# Patient Record
Sex: Female | Born: 1945
Health system: Southern US, Community
[De-identification: ages and names within clinical notes are randomized; demographics above are authoritative.]

## PROBLEM LIST (undated history)

## (undated) DIAGNOSIS — G35 Multiple sclerosis: Secondary | ICD-10-CM

## (undated) DIAGNOSIS — M1990 Unspecified osteoarthritis, unspecified site: Secondary | ICD-10-CM

## (undated) DIAGNOSIS — I2510 Atherosclerotic heart disease of native coronary artery without angina pectoris: Secondary | ICD-10-CM

## (undated) DIAGNOSIS — T8859XA Other complications of anesthesia, initial encounter: Secondary | ICD-10-CM

## (undated) DIAGNOSIS — K224 Dyskinesia of esophagus: Secondary | ICD-10-CM

## (undated) DIAGNOSIS — J45909 Unspecified asthma, uncomplicated: Secondary | ICD-10-CM

## (undated) DIAGNOSIS — D649 Anemia, unspecified: Secondary | ICD-10-CM

## (undated) DIAGNOSIS — I209 Angina pectoris, unspecified: Secondary | ICD-10-CM

## (undated) DIAGNOSIS — K219 Gastro-esophageal reflux disease without esophagitis: Secondary | ICD-10-CM

## (undated) DIAGNOSIS — T4145XA Adverse effect of unspecified anesthetic, initial encounter: Secondary | ICD-10-CM

## (undated) DIAGNOSIS — H04123 Dry eye syndrome of bilateral lacrimal glands: Secondary | ICD-10-CM

## (undated) HISTORY — PX: TONSILLECTOMY: SUR1361

## (undated) HISTORY — PX: OTHER SURGICAL HISTORY: SHX169

## (undated) HISTORY — PX: ABDOMINAL HYSTERECTOMY: SHX81

## (undated) HISTORY — PX: KNEE ARTHROSCOPY: SHX127

## (undated) HISTORY — DX: Atherosclerotic heart disease of native coronary artery without angina pectoris: I25.10

## (undated) HISTORY — PX: BLEPHAROPLASTY: SUR158

## (undated) HISTORY — PX: PARATHYROIDECTOMY: SHX19

---

## 1998-01-26 ENCOUNTER — Emergency Department (HOSPITAL_COMMUNITY): Admission: EM | Admit: 1998-01-26 | Discharge: 1998-01-26 | Payer: Self-pay | Admitting: Emergency Medicine

## 1998-01-26 ENCOUNTER — Encounter: Payer: Self-pay | Admitting: Emergency Medicine

## 1998-10-21 ENCOUNTER — Other Ambulatory Visit: Admission: RE | Admit: 1998-10-21 | Discharge: 1998-10-21 | Payer: Self-pay | Admitting: Gynecology

## 1999-06-18 ENCOUNTER — Encounter: Payer: Self-pay | Admitting: Gynecology

## 1999-06-18 ENCOUNTER — Encounter: Admission: RE | Admit: 1999-06-18 | Discharge: 1999-06-18 | Payer: Self-pay | Admitting: Gynecology

## 1999-10-25 ENCOUNTER — Other Ambulatory Visit: Admission: RE | Admit: 1999-10-25 | Discharge: 1999-10-25 | Payer: Self-pay | Admitting: Gynecology

## 2000-10-31 ENCOUNTER — Other Ambulatory Visit: Admission: RE | Admit: 2000-10-31 | Discharge: 2000-10-31 | Payer: Self-pay | Admitting: Gynecology

## 2000-11-28 ENCOUNTER — Encounter: Admission: RE | Admit: 2000-11-28 | Discharge: 2000-11-28 | Payer: Self-pay | Admitting: Gynecology

## 2000-11-28 ENCOUNTER — Encounter: Payer: Self-pay | Admitting: Gynecology

## 2001-08-23 ENCOUNTER — Encounter (HOSPITAL_COMMUNITY): Payer: Self-pay | Admitting: Oncology

## 2001-08-23 ENCOUNTER — Emergency Department (HOSPITAL_COMMUNITY): Admission: EM | Admit: 2001-08-23 | Discharge: 2001-08-23 | Payer: Self-pay | Admitting: Emergency Medicine

## 2001-11-30 ENCOUNTER — Encounter: Payer: Self-pay | Admitting: Internal Medicine

## 2001-11-30 ENCOUNTER — Encounter: Admission: RE | Admit: 2001-11-30 | Discharge: 2001-11-30 | Payer: Self-pay | Admitting: Internal Medicine

## 2002-05-07 ENCOUNTER — Encounter: Payer: Self-pay | Admitting: Emergency Medicine

## 2002-05-07 ENCOUNTER — Inpatient Hospital Stay (HOSPITAL_COMMUNITY): Admission: EM | Admit: 2002-05-07 | Discharge: 2002-05-08 | Payer: Self-pay | Admitting: Emergency Medicine

## 2002-12-03 ENCOUNTER — Encounter: Payer: Self-pay | Admitting: Internal Medicine

## 2002-12-03 ENCOUNTER — Encounter: Admission: RE | Admit: 2002-12-03 | Discharge: 2002-12-03 | Payer: Self-pay | Admitting: Internal Medicine

## 2003-12-12 ENCOUNTER — Ambulatory Visit (HOSPITAL_BASED_OUTPATIENT_CLINIC_OR_DEPARTMENT_OTHER): Admission: RE | Admit: 2003-12-12 | Discharge: 2003-12-12 | Payer: Self-pay | Admitting: Orthopedic Surgery

## 2003-12-12 ENCOUNTER — Ambulatory Visit (HOSPITAL_COMMUNITY): Admission: RE | Admit: 2003-12-12 | Discharge: 2003-12-12 | Payer: Self-pay | Admitting: Orthopedic Surgery

## 2003-12-22 ENCOUNTER — Ambulatory Visit (HOSPITAL_COMMUNITY): Admission: RE | Admit: 2003-12-22 | Discharge: 2003-12-22 | Payer: Self-pay | Admitting: Gastroenterology

## 2004-02-16 ENCOUNTER — Ambulatory Visit (HOSPITAL_COMMUNITY): Admission: RE | Admit: 2004-02-16 | Discharge: 2004-02-16 | Payer: Self-pay | Admitting: Internal Medicine

## 2004-07-02 ENCOUNTER — Ambulatory Visit (HOSPITAL_COMMUNITY): Admission: RE | Admit: 2004-07-02 | Discharge: 2004-07-02 | Payer: Self-pay | Admitting: Gastroenterology

## 2004-10-20 ENCOUNTER — Ambulatory Visit (HOSPITAL_COMMUNITY): Admission: RE | Admit: 2004-10-20 | Discharge: 2004-10-20 | Payer: Self-pay | Admitting: Orthopedic Surgery

## 2004-10-21 ENCOUNTER — Ambulatory Visit (HOSPITAL_BASED_OUTPATIENT_CLINIC_OR_DEPARTMENT_OTHER): Admission: RE | Admit: 2004-10-21 | Discharge: 2004-10-22 | Payer: Self-pay | Admitting: Orthopedic Surgery

## 2005-03-14 ENCOUNTER — Ambulatory Visit (HOSPITAL_COMMUNITY): Admission: RE | Admit: 2005-03-14 | Discharge: 2005-03-14 | Payer: Self-pay | Admitting: Internal Medicine

## 2006-03-20 ENCOUNTER — Emergency Department (HOSPITAL_COMMUNITY): Admission: EM | Admit: 2006-03-20 | Discharge: 2006-03-20 | Payer: Self-pay | Admitting: Emergency Medicine

## 2006-03-23 ENCOUNTER — Ambulatory Visit (HOSPITAL_COMMUNITY): Admission: RE | Admit: 2006-03-23 | Discharge: 2006-03-23 | Payer: Self-pay | Admitting: Internal Medicine

## 2006-07-25 ENCOUNTER — Ambulatory Visit (HOSPITAL_BASED_OUTPATIENT_CLINIC_OR_DEPARTMENT_OTHER): Admission: RE | Admit: 2006-07-25 | Discharge: 2006-07-25 | Payer: Self-pay | Admitting: Orthopedic Surgery

## 2006-07-25 ENCOUNTER — Encounter (INDEPENDENT_AMBULATORY_CARE_PROVIDER_SITE_OTHER): Payer: Self-pay | Admitting: *Deleted

## 2006-08-01 ENCOUNTER — Observation Stay (HOSPITAL_COMMUNITY): Admission: EM | Admit: 2006-08-01 | Discharge: 2006-08-01 | Payer: Self-pay | Admitting: Emergency Medicine

## 2006-10-04 HISTORY — PX: CARDIAC CATHETERIZATION: SHX172

## 2006-11-14 ENCOUNTER — Encounter: Admission: RE | Admit: 2006-11-14 | Discharge: 2006-11-14 | Payer: Self-pay | Admitting: Internal Medicine

## 2006-12-12 ENCOUNTER — Other Ambulatory Visit: Admission: RE | Admit: 2006-12-12 | Discharge: 2006-12-12 | Payer: Self-pay | Admitting: Interventional Radiology

## 2006-12-12 ENCOUNTER — Encounter: Admission: RE | Admit: 2006-12-12 | Discharge: 2006-12-12 | Payer: Self-pay | Admitting: Endocrinology

## 2006-12-12 ENCOUNTER — Encounter (INDEPENDENT_AMBULATORY_CARE_PROVIDER_SITE_OTHER): Payer: Self-pay | Admitting: Interventional Radiology

## 2007-03-26 ENCOUNTER — Ambulatory Visit (HOSPITAL_COMMUNITY): Admission: RE | Admit: 2007-03-26 | Discharge: 2007-03-26 | Payer: Self-pay | Admitting: Internal Medicine

## 2007-11-05 ENCOUNTER — Encounter: Admission: RE | Admit: 2007-11-05 | Discharge: 2007-11-05 | Payer: Self-pay | Admitting: Internal Medicine

## 2008-04-03 ENCOUNTER — Ambulatory Visit (HOSPITAL_COMMUNITY): Admission: RE | Admit: 2008-04-03 | Discharge: 2008-04-03 | Payer: Self-pay | Admitting: Internal Medicine

## 2009-04-07 ENCOUNTER — Other Ambulatory Visit: Admission: RE | Admit: 2009-04-07 | Discharge: 2009-04-07 | Payer: Self-pay | Admitting: Internal Medicine

## 2009-04-20 ENCOUNTER — Ambulatory Visit (HOSPITAL_COMMUNITY): Admission: RE | Admit: 2009-04-20 | Discharge: 2009-04-20 | Payer: Self-pay | Admitting: Internal Medicine

## 2009-04-23 ENCOUNTER — Encounter: Admission: RE | Admit: 2009-04-23 | Discharge: 2009-04-23 | Payer: Self-pay | Admitting: Neurology

## 2009-05-09 ENCOUNTER — Inpatient Hospital Stay (HOSPITAL_COMMUNITY): Admission: EM | Admit: 2009-05-09 | Discharge: 2009-05-12 | Payer: Self-pay | Admitting: Emergency Medicine

## 2010-04-18 ENCOUNTER — Encounter: Payer: Self-pay | Admitting: Internal Medicine

## 2010-04-23 ENCOUNTER — Other Ambulatory Visit: Payer: Self-pay | Admitting: Dermatology

## 2010-05-28 ENCOUNTER — Other Ambulatory Visit: Payer: Self-pay | Admitting: Dermatology

## 2010-06-15 ENCOUNTER — Other Ambulatory Visit (HOSPITAL_COMMUNITY): Payer: Self-pay | Admitting: Internal Medicine

## 2010-06-15 DIAGNOSIS — Z1231 Encounter for screening mammogram for malignant neoplasm of breast: Secondary | ICD-10-CM

## 2010-06-16 LAB — OVA AND PARASITE EXAMINATION

## 2010-06-16 LAB — BASIC METABOLIC PANEL
BUN: 14 mg/dL (ref 6–23)
BUN: 8 mg/dL (ref 6–23)
CO2: 27 mEq/L (ref 19–32)
Calcium: 8.9 mg/dL (ref 8.4–10.5)
Calcium: 9.5 mg/dL (ref 8.4–10.5)
Creatinine, Ser: 0.73 mg/dL (ref 0.4–1.2)
Creatinine, Ser: 0.94 mg/dL (ref 0.4–1.2)
GFR calc Af Amer: >60 mL/min (ref >60)
Glucose, Bld: 112 mg/dL — ABNORMAL HIGH (ref 70–99)
Potassium: 3.8 mEq/L (ref 3.5–5.1)

## 2010-06-16 LAB — COMPREHENSIVE METABOLIC PANEL
Alkaline Phosphatase: 70 U/L (ref 39–117)
BUN: 6 mg/dL (ref 6–23)
CO2: 26 mEq/L (ref 19–32)
Chloride: 109 mEq/L (ref 96–112)
Creatinine, Ser: 0.77 mg/dL (ref 0.4–1.2)
GFR calc Af Amer: >60 mL/min (ref >60)
GFR calc non Af Amer: >60 mL/min (ref >60)
Glucose, Bld: 112 mg/dL — ABNORMAL HIGH (ref 70–99)
Sodium: 142 mEq/L (ref 135–145)

## 2010-06-16 LAB — DIFFERENTIAL
Basophils Absolute: 0 10*3/uL (ref 0.0–0.1)
Basophils Relative: 0 % (ref 0–1)
Basophils Relative: 0 % (ref 0–1)
Eosinophils Absolute: 0 10*3/uL (ref 0.0–0.7)
Eosinophils Absolute: 0.1 10*3/uL (ref 0.0–0.7)
Lymphocytes Relative: 34 % (ref 12–46)
Lymphs Abs: 2.9 10*3/uL (ref 0.7–4.0)
Monocytes Absolute: 0.6 10*3/uL (ref 0.1–1.0)
Monocytes Absolute: 0.7 10*3/uL (ref 0.1–1.0)
Monocytes Relative: 7 % (ref 3–12)
Neutro Abs: 5.8 10*3/uL (ref 1.7–7.7)

## 2010-06-16 LAB — STOOL CULTURE

## 2010-06-16 LAB — HEMOCCULT GUIAC POC 1CARD (OFFICE): Fecal Occult Bld: POSITIVE

## 2010-06-16 LAB — CLOSTRIDIUM DIFFICILE EIA: C difficile Toxins A+B, EIA: NEGATIVE

## 2010-06-16 LAB — CBC
Hemoglobin: 12.5 g/dL (ref 12.0–15.0)
Hemoglobin: 14.8 g/dL (ref 12.0–15.0)
MCHC: 34.4 g/dL (ref 30.0–36.0)
MCHC: 34.5 g/dL (ref 30.0–36.0)
MCV: 92.3 fL (ref 78.0–100.0)
Platelets: 175 10*3/uL (ref 150–400)
Platelets: 180 10*3/uL (ref 150–400)
RBC: 4.05 MIL/uL (ref 3.87–5.11)
RBC: 4.64 MIL/uL (ref 3.87–5.11)
RDW: 13 % (ref 11.5–15.5)
RDW: 13.3 % (ref 11.5–15.5)
WBC: 8.6 10*3/uL (ref 4.0–10.5)
WBC: 8.8 10*3/uL (ref 4.0–10.5)

## 2010-06-16 LAB — URINALYSIS, ROUTINE W REFLEX MICROSCOPIC
Bilirubin Urine: NEGATIVE
Glucose, UA: NEGATIVE mg/dL
Hgb urine dipstick: NEGATIVE
Nitrite: NEGATIVE
Urobilinogen, UA: 0.2 mg/dL (ref 0.0–1.0)
pH: 8 (ref 5.0–8.0)

## 2010-06-16 LAB — FECAL LACTOFERRIN, QUANT

## 2010-06-16 LAB — MAGNESIUM: Magnesium: 2.3 mg/dL (ref 1.5–2.5)

## 2010-06-16 LAB — URINE MICROSCOPIC-ADD ON

## 2010-06-22 ENCOUNTER — Ambulatory Visit (HOSPITAL_COMMUNITY)
Admission: RE | Admit: 2010-06-22 | Discharge: 2010-06-22 | Disposition: A | Discharge status: Home / Self Care | Payer: Medicare Other | Source: Ambulatory Visit | Attending: Internal Medicine | Admitting: Internal Medicine

## 2010-06-22 DIAGNOSIS — Z1231 Encounter for screening mammogram for malignant neoplasm of breast: Secondary | ICD-10-CM | POA: Insufficient documentation

## 2010-08-13 NOTE — Op Note (Signed)
NAMEGERENE, NEDD                ACCOUNT NO.:  [id number]   MEDICAL RECORD NO.:  [id number]          PATIENT TYPE:  AMB   LOCATION:  ENDO                         FACILITY:  [facility]   PHYSICIAN:  Bernette Redbird, M.D.   DATE OF BIRTH:  03/16/1946   DATE OF PROCEDURE:  07/02/2004  DATE OF DISCHARGE:                                 OPERATIVE REPORT   PROCEDURE:  Colonoscopy.   INDICATIONS:  Screening for colon cancer.  The patient had a negative  colonoscopy approximately seven years ago.   FINDINGS:  Normal exam to the cecum except for some degree of sigmoid  fixation.   DESCRIPTION OF PROCEDURE:  The nature, purpose and risks of this procedure  were familiar to the patient who provided written consent.  Sedation for  this procedure and the upper endoscopy which preceded it, total fentanyl  100 mg and Versed 12 mg IV without arrhythmias or desaturation.  The Olympus  adjustable tension pediatric video coloscope was advanced with some  difficulty to the fixated sigmoid region, using some overriding of loops,  and turning the patient into the supine position with some external  compression to reach the cecum as identified by clear visualization of the  appendiceal orifice and transillumination of the right lower quadrant, after  which pull-back was performed.  The quality of the prep was excellent and it  was felt that all areas were well seen.   This was a normal examination.  No polyps, cancer, colitis, vascular  malformations or diverticulosis were noted.  Retroflexion of the rectum and  reinspection of the rectum were unremarkable.  No biopsies were obtained.  The patient tolerated the procedure well and there were no apparent  complications.   IMPRESSION:  Normal screening colonoscopy in a patient with a family history  of colon polyps in her mother (histology not known).   PLAN:  Followup colonoscopy in five years because of the family history of  colon polyps.      RB/MEDQ  D:  07/02/2004  T:  07/02/2004  Job:  [id number]   cc:   Soyla Murphy. Renne Crigler, M.D.  [address]  [place]  [place] [address]  Fax: [phone]

## 2010-08-13 NOTE — Cardiovascular Report (Signed)
NAME:  [name], [name]                          ACCOUNT NO.:  [id number]   MEDICAL RECORD NO.:  [id number]                   PATIENT TYPE:  OUT   LOCATION:  CATH                                 FACILITY:  [facility]   PHYSICIAN:  John R. Tysinger, M.D.              DATE OF BIRTH:  1945/07/26   DATE OF PROCEDURE:  05/08/2002  DATE OF DISCHARGE:                              CARDIAC CATHETERIZATION   REFERRING PHYSICIANS:  Dr. Merri Brunette and Dr. Aggie Cosier   PROCEDURES:  1. Left heart catheterization.  2. Coronary cineangiography.  3. Left ventricular cineangiogram.  4. Perclose of the right femoral artery.   INDICATION FOR PROCEDURES:  This 65 year old female has had several  admissions for severe anterior chest pain described as a squeezing chest  pain that comes in waves, and is somewhat atypical with a sharp component.  She has had several admissions for this same chest pain and was now  considered at high risk for ischemic heart disease with a clinical need to  assess her coronary anatomy.   DESCRIPTION OF PROCEDURE:  After signing an informed consent, the patient  was transported from her room at [facility] to the [facility] Cardiac Catheterization Laboratory.  Her right groin was prepped  and draped in the sterile fashion and anesthetized locally with 1%  lidocaine.  A 6 French introducer sheath was inserted percutaneously into  the right femoral artery.  A 6 French #4 Judkins coronary catheters were  used to make injections into the native coronary arteries.  A 6 French  pigtail catheter was used to measure pressures in the left ventricle and  aorta and to make a mid stream injection into the left ventricle.  The  patient tolerated the procedure well and no complications were noted.  At  the end of the procedure, the catheter and sheath were removed from the  right femoral artery and hemostasis was easily obtained with a Perclose  closure system.   MEDICATIONS GIVEN:  None.   HEMODYNAMIC DATA:  Left ventricular pressure 170/13-24, aortic pressure  170/82 with a mean of 118.  Left ventricular ejection fraction 60-70%.   CINE FINDINGS:   CORONARY CINE ANGIOGRAPHY:  1. Left coronary artery:  The ostium and left main appear normal. The left     main is a very short structure with essentially two separate takeoff     orifices for the LAD and circumflex.  2. Left anterior descending:  The LAD is normal in appearance without     significant plaque.  There is a mild area in the mid LAD which appears to     have mild muscle bridging.  The remainder of the LAD is normal and has     normal flow.  3. Circumflex coronary artery:  There is a mild plaque at the ostium of the     circumflex, which causes a less  than 20% narrowing.  Otherwise, the     circumflex appears normal and has normal antegrade flow and normal distal     runoff.  4. Right coronary artery:  Appears normal.   LEFT VENTRICULAR CINEANGIOGRAM:  The left ventricular chamber size and  contractility appears normal.  The LAD appears normal.  The overall left  ventricular contractility is normal with an ejection fraction between 60 and  70%.  The left ventricular wall thickness appears normal.  The mitral and  aortic valves also appear normal.   FINAL DIAGNOSES:  1. Minor coronary artery disease with a mild plaque in the ostium of the     circumflex coronary artery, less than 20% and mild muscle bridging in the     mid left anterior descending.  2. Normal coronary flow.  3. Normal left ventricular function.  4. Normal mitral and aortic valves.  5. Successful Perclose of the right femoral artery.   DISPOSITION:  I feel that the patient is at low risk cardiovascular wise and  can be continued on her same medical management.  She can be transferred to  the short-stay unit today rather than returning to [facility] and then be  discharged from the short-stay unit when  stable.                                                    John R. Tysinger, M.D.    JRT/MEDQ  D:  05/08/2002  T:  05/08/2002  Job:  [id number]   cc:   Jaclyn Prime. Lucas Mallow, M.D.  [address]  [place]  [place] [address]  Fax: [phone]   Soyla Murphy. Renne Crigler, M.D.  [address]  [place]  [place] [address]  Fax: [phone]   Cardiac Catheterization Laboratory

## 2010-08-13 NOTE — Op Note (Signed)
NAMESHEVY, YANEY                ACCOUNT NO.:  [id number]   MEDICAL RECORD NO.:  [id number]          PATIENT TYPE:  AMB   LOCATION:  DSC                          FACILITY:  [facility]   PHYSICIAN:  Katy Fitch. Sypher, M.D. DATE OF BIRTH:  1945-04-24   DATE OF PROCEDURE:  10/21/2004  DATE OF DISCHARGE:                                 OPERATIVE REPORT   PREOP DIAGNOSIS:  Advanced degenerative arthritis, right thumb CMC joint,  Eaton stage III.   POSTOP DIAGNOSIS:  Advanced degenerative arthritis, right thumb CMC joint,  Eaton stage III.   OPERATION:  1.  Resection of right trapezium with synovectomy of carpometacarpal joint,      removing multiple loose bodies.  2.  Distraction arthroplasty with 0.062 inches Kirschner wire fixation x 3.  3.  Palmaris longus free graft intermetacarpal ligament reconstruction.   OPERATING SURGEON:  Katy Fitch. Sypher, M.D.   ASSISTANT:  Nurse.   ANESTHESIA:  General by LMA technique, supervising anesthesiologist is Dr.  Gelene Mink   INDICATIONS:  [name] is a 65 year old patient well known to our  practice who has had a chronic history of bilateral thumb CMC arthrosis.  She has failed to have adequate pain relief after prolonged splinting,  activity modification, use of oral anti-inflammatory medications and steroid  injections.   Her x-rays revealed bone-on-bone arthropathy at the right thumb CMC joint.  She requested surgical reconstruction for pain relief.   Preoperatively she was advised of potential risks and benefits of surgery.  She understands that the anticipated benefit is significant pain relief  while maintaining adequate pinch prehensile strength.   She understands the potential risks of infection, rupture of the  intermetacarpal ligament reconstruction and the possibility to develop CRPS  type 1 or reflex sympathetic dystrophy type changes in the postoperative  period that could be troublesome and require further treatment.   After informed consent, she is brought to the operating room at this time.   PROCEDURE:  [name] is brought to the operating room and placed in  supine position on the operating table.   Following anesthesia consultation by Dr. Gelene Mink, general anesthesia by  LMA technique was selected.   Following induction of stable general anesthesia, the right arm was prepped  with Betadine soap solution, sterilely draped. 1 gram of Ancef was  administered as IV prophylactic antibiotic.   The procedure commenced with a curvilinear incision exposing the thenar  musculature and the insertion of the abductor pollicis longus tendons at the  base of thumb metacarpal. The interval between the extensor pollicis brevis  and abductor pollicis longus dorsal slip was sharply developed exposing the  [facility] joint. A capsulectomy was accomplished followed by subperiosteal  exposure of the entire trapezium with careful dorsal dissection, sparing the  radial artery and palmar dissection deep to the thenar muscles. The STT  joint was entered and found to have a minor degree of chondromalacia.   The trapezium was carefully morselized with a fine rongeur and removed  piecemeal followed by removal of a large loose body from between the index  and thumb metacarpals.  A complete synovectomy was accomplished. There was  not a significant osteophyte noted at the palmar beak of the metacarpal.   Drill holes were created through the base of thumb metacarpal and index  metacarpal to allow passage of the palmaris longus tendon graft to create an  intermetacarpal ligament. The palmaris longus tendon was harvested through a  short transverse incision on the volar aspect of the distal wrist flexion  crease. A Brand tendon stripper was used to harvest the entire palmaris  longus and a portion of its muscular origin. The muscle fibers were removed  with osteotome followed by threading of the palmaris longus through the   insertion of extensor carpi radialis brevis dorsally and both tails of the  palmaris longus were brought through the index metacarpal from dorsal ulnar  to palmar radial and the tails of the palmaris longus and brought up to the  base of thumb metacarpal from its proximal surface to the dorsal cortex  creating an intermetacarpal ligament.   The thumb was placed in distraction and the ligament tension adjusted  appropriately. The ligaments then completed by anchoring the distal stumps  of the palmaris longus in the abductor pollicis longus dorsal slip which was  attached to the base of thumb metacarpal.   A very stable construct was achieved.   The thumb was then distracted in the manner of Meals utilizing three 0.062-  inch Kirschner wires with radiographic control, suspending the thumb  metacarpal off of the index metacarpal. Care was taken to maintain a  position of palmar and radial neutral abduction.   C-arm fluoroscopy confirmed satisfactory position of the metacarpal and  Kirschner wires.   All wounds were then irrigated followed by hemostasis with bipolar cautery.  The wounds repaired with intradermal through Prolene and Steri-Strips.   0.25% Marcaine was infiltrated for postoperative analgesia followed by  dressing the wound with a voluminous gauze dressing, sterile Webril and  dorsal and palmar plaster sandwich splints supporting the thumb in the pin  position.   There were no apparent complications.   Tourniquet released with a total tourniquet time of 71 minutes at 250 mmHg.   Note that Ms. Crocket will be admitted to recovery care center for observation  of her vital signs and appropriate analgesics in the form of IV PCA morphine  and p.o. and IV Dilaudid.   She will be discharged by the nursing staff in the morning of October 22, 2004  after a 23-hour observation.      RVS/MEDQ  D:  10/21/2004  T:  10/21/2004  Job:  [id number]

## 2010-08-13 NOTE — H&P (Signed)
[name], [name] NO.:  [id number]   MEDICAL RECORD NO.:  [id number]          PATIENT TYPE:  INP   LOCATION:  1824                         FACILITY:  [facility]   PHYSICIAN:  Michaelyn Barter, M.D. DATE OF BIRTH:  1945-08-17   DATE OF ADMISSION:  08/01/2006  DATE OF DISCHARGE:                              HISTORY & PHYSICAL   PRIMARY CARE PHYSICIAN:  Dr. Merri Brunette.   CARDIOLOGIST:  Dr. Lucas Mallow.   CHIEF COMPLAINT:  Chest pain.   HISTORY OF PRESENT ILLNESS:  The patient is a 65 year old female with a  past medical history of chest pain and MS.  She states that last night  at approximately 9:15 p.m., she developed some centrally located chest  pain that radiated to the right side of her chest.  She states that she  felt a squeezing-like sensation in her neck and pain in her teeth.  The  pain lasted for approximately 20 minutes.  She took 2 sublingual  nitroglycerin, which did not alleviate her symptoms.  She called EMS and  states that EMS gave her 2 more sublingual nitroglycerin and she started  to feel some relief.  She states that she has had similar pain off and  on for several years.  She states that the pain is typical of her prior  episodes of chest pain.  She states that the pain usually goes away  spontaneously.  There are no aggravating factors.  She gets the pain  every 6 weeks to every few days and is always pretty similar in its  occurrence.  The pain was sharp.  She had some nausea, but denies  fevers, chills or emesis.  No shortness of breath and no cough.   PAST MEDICAL HISTORY:  1. Multiple sclerosis diagnosed in 2000.  2. Asthma.  3. Episodes of chest pain for which the patient underwent cardiac      catheterization, May 08, 2002, completed by Dr. Charolette Child.      At that particular time, the patient was found to have minor      coronary artery disease with mild plaque in the ostium of the      circumflex coronary artery, less than  20%, and mild muscle bridging      in the mid left anterior descending.   PAST SURGICAL HISTORY:  1. Release of right thumb A1 pulley.  2. Injection of right thumb carpometacarpal joint with Depo-Medrol and      lidocaine secondary to chronic stenosing tenosynovitis of the right      thumb at A1 pulley and painful right thumb carpometacarpal joint      due to osteoarthrosis, December 12, 2003, completed by Dr. Josephine Igo.  3. Colonoscopy completed July 02, 2004 by Dr. Matthias Hughs, which was found      to be normal.  4. EGD performed July 02, 2004 secondary to a chief complaint of chest      pain.  5. Resection of the right trapezium with synovectomy of the      carpometacarpal joint secondary to advanced degenerative  arthritis      of the right thumb CMC joint, each in stage III, completed by Dr.      Teressa Senter October 21, 2004.  6. Reconstruction of the left thumb carpometacarpal joint by left      trapezium excision with synovectomy, completed July 25, 2006 by      Dr. Josephine Igo.   ALLERGIES:  None.   HOME MEDICATIONS:  1. Norvasc.  2. Prilosec.  3. Vytorin.  4. Mobic.  5. Baby aspirin.  6. Gabapentin on a p.r.n. basis.   SOCIAL HISTORY:  Cigarettes:  The patient denies.  Alcohol:  The patient  denies.   FAMILY HISTORY:  The mother is deceased most likely secondary to failure  to thrive.  Father is 71 and has a history of CHF.   REVIEW OF SYSTEMS:  As per HPI.   PHYSICAL EXAMINATION:  GENERAL:  The patient is awake.  She is in no  obvious distress.  VITALS:  Temperature is 98.1, blood pressure 122/65, heart rate 64,  respirations 16, O2 SAT 96% on room air.  HEENT:  Normocephalic, atraumatic.  Anicteric.  Extraocular movements  are intact.  Oral mucosa is pink.  No thrush.  No exudates.  Pupils are  equally reactive to light.  NECK:  No JVD.  No lymphadenopathy.  Thyroid is not palpable.  CARDIAC:  S1 and S2 present.  Regular rate and rhythm.  No S3, no S4.   RESPIRATORY:  No crackles or wheezes.  ABDOMEN:  Flat, soft, nontender and non-distended.  Positive bowel  sounds for 4 quadrants.  No masses are palpated.  EXTREMITIES:  Left arm is in a cast.  There is no leg swelling.  NEUROLOGICAL:  The patient is alert and oriented x3.  MUSCULOSKELETAL:  Right arm pain strength 5/5; bilateral leg strength  5/5.   LABORATORY AND ACCESSORY CLINICAL DATA:  A pH of 7.376, pCO2 40.0,  bicarbonate 23.5.  Hemoglobin 12.6, hematocrit 37.0.  D-dimer 0.46.  Sodium 140, potassium 3.9, chloride 110, glucose 103, BUN 24, creatinine  0.8.  CK-MB POC less than 1.0, troponin I POC less than 0.05, myoglobin  POC 46.9.   Chest x-ray reveals no acute disease.  Mild peribronchial thickening may  relate to chronic bronchitis or smoking.   EKG reveals sinus bradycardia, no Q waves or ST abnormalities.   ASSESSMENT AND PLAN:  1. Chest pain:  The patient's current chest pain has some atypical      features to it.  She indicates that she has had similar symptoms      for quite some time.  The trigger is questionable.  We will admit      the patient for 23-hour observation.  We will check the patient's      troponin I and CK-MB x3 every 8 hours apart to rule the patient out      for cardiac event.  Again, the patient's EKG is not impressive at      this particular time.  We will provide oxygen, morphine and      aspirin.  2. History of multiple sclerosis:  We will monitor this for now.  3. Gastrointestinal prophylaxis:  We will provide Protonix.  4. Deep venous thrombosis prophylaxis:  We will provide Lovenox.      Michaelyn Barter, M.D.  Electronically Signed     OR/MEDQ  D:  08/01/2006  T:  08/01/2006  Job:  [id number]   cc:   Soyla Murphy. Renne Crigler, M.D.

## 2010-08-13 NOTE — Op Note (Signed)
NAME:  [name], [name]                          ACCOUNT NO.:  [id number]   MEDICAL RECORD NO.:  [id number]                   PATIENT TYPE:  AMB   LOCATION:  DSC                                  FACILITY:  [facility]   PHYSICIAN:  Katy Fitch. Naaman Plummer., M.D.          DATE OF BIRTH:  26-May-1945   DATE OF PROCEDURE:  12/12/2003  DATE OF DISCHARGE:                                 OPERATIVE REPORT   PREOPERATIVE DIAGNOSES:  1.  Chronic stenosing tenosynovitis, right thumb at A1 pulley.  2.  Painful right thumb carpometacarpal joint due to osteoarthrosis.   POSTOPERATIVE DIAGNOSES:  1.  Chronic stenosing tenosynovitis, right thumb at A1 pulley.  2.  Painful right thumb carpometacarpal joint due to osteoarthrosis.   OPERATION PERFORMED:  1.  Release of right thumb A-1 pulley.  2.  Injection of right thumb carpometacarpal joint with Depo-Medrol and      lidocaine.   SURGEON:  Katy Fitch. Sypher, M.D.   ASSISTANT:  Jonni Sanger, P.A.   ANESTHESIA:  0.25% Marcaine and 2% lidocaine metacarpal head level block  supplemented by IV sedation.   SUPERVISING ANESTHESIOLOGIST:  Janetta Hora. Gelene Mink, M.D.   INDICATIONS FOR PROCEDURE:  [name] is a 65 year old right-hand  dominant woman with a history of multiple sclerosis.  She has also had a  history of coronary artery disease.  She is on chronic disability due to her  illness.  She is a well-known patient to our practice.  She has a history of  triggering of her right thumb and bilateral thumb carpometacarpal pain due  to progressive degenerative arthritis.  Due to failure to respond to  nonoperative measures, the patient is brought to the operating room at this  time anticipating release of her right thumb A-1 pulley under local  anesthesia and sedation and she requested that we inject her right thumb CMC  joint while under sedation to minimize the discomfort of this procedure.  After informed consent, she is brought to the operating room  at this time.   DESCRIPTION OF PROCEDURE:  [name] was brought to the operating room  and placed in supine position on the operating table.  Following light  sedation, the right arm was prepped with Betadine soap and solution and  sterilely draped.  A pneumatic tourniquet was applied to the proximal  brachium.  0.25% Marcaine and 2% lidocaine were infiltrated at metacarpal  head level to obtain a digital block and after sedation was satisfactory,  the right thumb CMC joint was injected from a palmar approach with a 27  gauge needle with a mixture of 50/50 Depo-Medrol and 1% plain lidocaine.  Good joint distention was achieved.   Attention was then directed to the right thumb stenosing tenosynovitis  predicament.  The hand was exsanguinated with an Esmarch bandage and the  arterial tourniquet on the proximal brachium inflated to 230 mmHg.  The  procedure commenced with  a short transverse incision directly over the  palpably enlarged A-1 pulley.  Subcutaneous tissues were carefully divided  taking care to identify and retract the radial proper digital nerve to the  thumb.  The A-1 pulley was isolated and split with scalpel and scissors.  Thereafter full range of motion of the interphalangeal joint was recovered.  The wound was then repaired with interrupted suture of 5-0 nylon.  A  compressive dressing was applied with Xeroflo, sterile gauze and Ace wrap.  There were no apparent complications.      RVS/MEDQ  D:  12/12/2003  T:  12/14/2003  Job:  [id number]

## 2010-08-13 NOTE — Op Note (Signed)
[name], [name]                ACCOUNT NO.:  [id number]   MEDICAL RECORD NO.:  [id number]          PATIENT TYPE:  AMB   LOCATION:  ENDO                         FACILITY:  [facility]   PHYSICIAN:  Bernette Redbird, M.D.   DATE OF BIRTH:  [birth year]   DATE OF PROCEDURE:  07/02/2004  DATE OF DISCHARGE:                                 OPERATIVE REPORT   PROCEDURE:  Upper endoscopy.   INDICATIONS FOR PROCEDURE:  Recurring chest pain of indeterminate etiology.   FINDINGS:  Normal exam.   SEDATION:  Fentanyl 50 mcg and Versed 8 mg IV, without arrhythmias or  desaturation.   DESCRIPTION OF PROCEDURE:  The nature, purpose, and risks of the procedure  had been discussed with the patient who provided written consent.  The  Olympus videoendoscope was passed under direct vision.  The vocal cords  looked normal.  The esophagus was readily entered and was normal in its  entirety, without evidence of any esophageal spasm, free reflux, reflux  esophagitis, Barrett's esophagus, varices, infection, neoplasia, ring,  stricture, or hiatal hernia.   The stomach contained no significant residual, and he had normal mucosa  without evidence of gastritis, erosions, ulcers, polyps, or masses.  Retroflex view of the cardia was normal, showing a snug diaphragmatic  hiatus.  The pylorus, duodenal bulb, and second duodenum looked normal.  The  scope was removed.   The patient tolerated the procedure well and without apparent complication.   IMPRESSION:  Normal endoscopy; no source of chest pain endoscopically  evident.  (V76.51).   PLAN:  No specific change in management or further evaluation is needed at  present.  The patient has been free of chest pain recently.      RB/MEDQ  D:  07/02/2004  T:  07/02/2004  Job:  [id number]   cc:   Soyla Murphy. Renne Crigler, M.D.  [address]  [place]  [place] [address]  Fax: [phone]   Jaclyn Prime. Lucas Mallow, M.D.  [address]  [place]  [place] [address]  Fax: [phone]

## 2010-08-13 NOTE — H&P (Signed)
NAMEJAMAR, [name]                          ACCOUNT NO.:  [id number]   MEDICAL RECORD NO.:  [id number]                   PATIENT TYPE:  INP   LOCATION:  0353                                 FACILITY:  [facility]   PHYSICIAN:  Jaclyn Prime. Lucas Mallow, M.D.                DATE OF BIRTH:  [birth year]   DATE OF ADMISSION:  05/07/2002  DATE OF DISCHARGE:                                HISTORY & PHYSICAL   CHIEF COMPLAINT:  Chest pain.   HISTORY OF PRESENT ILLNESS:  This is a 65 year old woman who has had  intermittent chest pain for the last year or two.  She developed severe  anterior squeezing chest pain which came and went in waves and has some  sharp component today.  This pain lasted for two hours and was associated  with severe radiation into her jaws and shoulders.  She came to the  emergency room because she was alone and the combination of pains.   PAST MEDICAL HISTORY:  Most notable for multiple sclerosis.  She has  suffered from that illness for some years now and has been disabled by it.  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  She now  functions reasonably well as a homemaker and does some teaching in preschool  at [employer].   She has been on Betaseron shots for multiple sclerosis and has previously  been on Carbatrol for chest discomfort. She has also taken Provigil 20 mg  daily within the last several months for anti-sleepiness drug.  She was seen  in consultation for chest pain last summer and at that time was placed on  Protonix.  She appeared to have an adequate response to that, but the  present episode of chest discomfort is more severe than what she has endured  previously.   CURRENT MEDICATIONS:  1. Aspirin 81 mg daily.  2. Betaseron injection every other day.  3. Vitamin C, E, and coenzyme Q plus multivitamins.   She saw her multiple sclerosis specialist, Dr. Trudie Buckler at [facility] on [address]  [place], [place] [employer],  [place] [address], within the last few days and was told that she can  take a month's vacation from Betaseron.   PAST SURGICAL HISTORY:  1. Tonsillectomy about 1964.  2. Hysterectomy about 1989.  3. Surgery for hyperparathyroidism in 1991 or 1993.  4. Removal of wisdom teeth.  5. C section.   SOCIAL HISTORY:  She has a daughter who is 33, and she has twin sons, both  at [employer].   FAMILY HISTORY:  Her parents are both living.  Her father is 61 and has  congestive heart failure.  Her mother is 28 and had bypass surgery at about  the age of 47.  A brother is alive and well.   REVIEW OF SYSTEMS:  CONSTITUTIONAL:  She denies fevers, chills, or  sweats.  She has had no claudication or edema.  EYES:  She does have some blurring  but no distinct double vision.  She wears glasses.  ENT: No deafness or  dizziness.  She has her own teeth.  CARDIOVASCULAR:  See History of Present  Illness.  RESPIRATORY:  There is a questionable history of asthma.  She has  never smoked.  GI:  She has symptoms of globus and possibly reflux.  No  change in bowel habits and no diarrhea.  GU:  No dysuria or pyuria.  MUSCULOSKELETAL:  She has pain in her right shoulder and diffuse muscular  pain and weakness.  SKIN AND BREASTS:  No rash or nodule.  NEUROLOGIC:  No  faintness or syncope.  PSYCHIATRIC:  No depression or hallucinations.  ENDOCRINE:  No known diabetes or thyroid disease. HEME:  No swelling in the  neck, axillae, or groin, lymphatic.  No known drug allergies.  All the  remaining systems in the comprehensive 14-system review are negative.   PHYSICAL EXAMINATION:  VITAL SIGNS:  Blood pressure 130/70, pulse 65 and  regular, respirations 16 and unlabored.  GENERAL:  Well developed, well nourished middle-aged woman in no acute  distress.  She is oriented to person, place, and time.  Mood and affect are  appropriate.  HEENT:  Conjunctivae and lids reveal no xanthelasma or arcus  senilis.  She  has her own teeth which are in good repair.  Oral mucosa reveals no pallor  or cyanosis.  NECK:  Supple and symmetrical.  Trachea is midline and mobile.  There is no  palpable thyromegaly or cervical noted.  No carotid bruit or JVD.  LUNGS:  Her respiratory effort is normal.  Lungs are clear to auscultation  and percussion.  BACK:  Straight and nontender.  Her gait is not tested.  HEART:  She would have limited ability to undergo a stress test.  (Not  mentioned above is the fact she had a Persantine Cardiolite test on March 04, 2002, with normal results.)  Her muscle strength and tone are at the low  limit of normal to moderately reduced for her age.  Cardiac apical impulses  could take border of cardiac illness within left anterior axillary line.  Heart rhythm is regular and rate is normal.  First sounds seen, second  physiologically.  SKIN:  Digits and nails are without clubbing or cyanosis.  Subcutaneous  tissue reveals no stasis dermatitis or ulcer.  ABDOMEN:  Flat and nontender without palpably enlarged liver or spleen. The  abdominal aorta is not palpable, and there is no bruit.  EXTREMITIES:  The femoral arteries are without bruits.  The posterior tibial  and dorsalis pedis pulses are both palpable 1 to 2+ bilaterally.  Her legs  reveal no edema.   LABORATORY DATA:  Available laboratory data and EKG were done today and  shows nonspecific T wave abnormality.  It is otherwise normal   ASSESSMENT:  The patient has had several prior evaluations for chest pain,  and this is her second hospital trip for that symptom.  As I have explained  to her, in spite of her negative previous findings, she really does now need  a heart catheterization to clarify her diagnosis, and if coronary artery  disease is not present, to exclude that as a diagnosis.  We will plan to put  her in telemetry bed, serial enzymes, and arrange for cardiac  catheterization by Dr. Aleen Campi  tomorrow.  Jaclyn Prime. Lucas Mallow, M.D.    DDG/MEDQ  D:  05/07/2002  T:  05/07/2002  Job:  [id number]

## 2010-08-13 NOTE — Discharge Summary (Signed)
[name], [name]                ACCOUNT NO.:  [id number]   MEDICAL RECORD NO.:  [id number]          PATIENT TYPE:  INP   LOCATION:  6524                         FACILITY:  [facility]   PHYSICIAN:  Hind I Elsaid, MD      DATE OF BIRTH:  1945/05/24   DATE OF ADMISSION:  07/31/2006  DATE OF DISCHARGE:  08/01/2006                               DISCHARGE SUMMARY   PRIMARY CARE PHYSICIAN:  Dr. Merri Brunette   CARDIOLOGIST:  Dr. Lucas Mallow   DISCHARGE DIAGNOSES:  1. Atypical chest pain, most probably could be due to angina.  2. Multiple sclerosis diagnosed in 2000.  3. Asthma.  4. Episode of chest pain for which she underwent cardiac cath in 2004      done by Dr. Aleen Campi.  The patient was found to have minor coronary      artery disease with mild blackened ostium of the circumflex.   DISCHARGE MEDICATIONS:  1. Aspirin 81 mg p.o. daily.  2. Nitroglycerin transdermal 0.2 mg daily p.r.n. for pain.  3. Vytorin one tab p.o. q.6-4 hour p.r.n.  4. Mobic.  5. Neurontin p.r.n.  6. Prilosec 20 mg p.o. daily.   CONSULTATIONS:  None.   PROCEDURE:  Chest x-ray:  No acute cardiopulmonary disease, mild  peribronchial thickening, may be chronic bronchitis or smoking.   BRIEF HISTORY:  She is a 65 year old female with past medical history of  chest pain and multiple sclerosis, last night at approximately 9:15 p.m.  developed some centrally located chest pain, radiated to the right side  of her chest, the pain is sharp pain and radiates to her neck and on her  teeth, it lasted approximately 20 minutes and she took two sublingual  nitroglycerin with no response and then two more on the EMS and then she  felt much better.  Since admission, she did not have any further chest  pain.  Patient was admitted for observation over 24 hours for monitoring  of any evidence of myocardial infarction or any evidence of unstable  angina.  During hospitalization, pain completely resolved actually from  the emergency  room.  Patient denies any further chest pain and denies  any shortness of breath and no cough.  During hospitalization, cardiac  enzymes, CK-MB and troponin was run about three times without any  evidence of myocardial infarction.  EKG was also done with no evidence  of any change on EKG from previous admission.  We think the pain is most  probably atypical chest pain and could be MS from her history of MS or  it could be related to a result of angina and patient may need to be  worked up as an outpatient with exercise stress test.  We will discharge  the patient with aspirin and nitroglycerin sublingual and patient to  make an appointment with Dr. Lucas Mallow, her cardiologist, within this week  or next week for evaluation of the chest pain for possible exercise  stress test to be done as outpatient.  Patient also advised to return to  the hospital if the chest pain returns back.  Patient completely  understand the situation and she agrees with the plan of care.   1. Multiple sclerosis stable at this time and no further      recommendation to be done inside the hospital.  2. History of asthma.  Patient to continue her own home medications.   DISPOSITION:  Patient is medically stable to be discharged home.  Patient to follow with Dr. Lucas Mallow and the PMD this week or next for  further evaluation of the chest pain for possible exercise stress test  if deemed necessary and return to the emergency room for further  evaluation of this pain if it comes back.      Hind Bosie Helper, MD  Electronically Signed     HIE/MEDQ  D:  08/01/2006  T:  08/01/2006  Job:  [id number]

## 2010-08-13 NOTE — Op Note (Signed)
[name], CRUTCHER                ACCOUNT NO.:  [id number]   MEDICAL RECORD NO.:  [id number]          PATIENT TYPE:  AMB   LOCATION:  DSC                          FACILITY:  [facility]   PHYSICIAN:  Katy Fitch. Sypher, M.D. DATE OF BIRTH:  Sep 13, 1945   DATE OF PROCEDURE:  07/25/2006  DATE OF DISCHARGE:                               OPERATIVE REPORT   PREOPERATIVE DIAGNOSIS:  Severe left thumb carpometacarpal arthrosis and  right thumb metacarpophalangeal joint arthritis.   POSTOPERATIVE DIAGNOSIS:  Severe left thumb carpometacarpal arthrosis  and right thumb metacarpophalangeal joint arthritis.   OPERATION:  1. Reconstruction of left thumb carpometacarpal joint by left      trapezium excision with synovectomy.  2. Reconstruction of the index thumb intermetacarpal ligament      utilizing a distally based one-half flexor carpi radialis graft      supplemented by an Arthrex tight rope suturedesis.  3. Injection of right thumb metacarpophalangeal joint under anesthesia      with Depo-Medrol 40 mg/mL and 2% lidocaine 50/50 mixture total      volume 0.9 mL.   OPERATING SURGEON:  Josephine Igo, M.D.   ASSISTANT:  Molly Maduro Dasnoit PA-C.   ANESTHESIA:  General by LMA supplemented by a left axillary block,  supervising anesthesiologist is Dr. Gypsy Balsam.   INDICATIONS:  [name] is a 65 year old woman who presented for  evaluation of bilateral hand arthritis.   She is status post reconstruction of her right thumb carpometacarpal  joint utilizing a tendon graft intermetacarpal ligament reconstruction  and a temporary pin fixation.  We have followed her for several years  and now she has disabling left thumb arthritis and requests similar  reconstruction.   On the left side, we have advised her to utilize a Arthrex tight rope  suturedesis rather than Kirschner wire fixation in addition to an  intermetacarpal ligament reconstruction.   Her palmaris longus is a very diminutive therefore we  recommended a  flexor carpi radialis partial distally based tendon graft.   After informed consent, she is brought to the operating room at this  time.   PROCEDURE:  [name] is brought to the operating room and placed in  supine position on the table.   Following anesthesia consult by Dr. Gypsy Balsam general anesthesia by LMA  technique supplemented by postoperative axillary block was recommended  and accepted by [name].   Dr. Gypsy Balsam placed the axillary block holding area without complication.   [name] was transferred to room 6 and placed in supine position on the  operating table.   Under Dr. Burnett Corrente direct supervision general anesthesia by LMA technique  was induced.   After alcohol prep the right thumb metacarpophalangeal joint was  injected with Depo-Medrol with lidocaine through a dorsal radial  approach.  Excellent capsular distension was achieved.   Attention then directed to the left arm.  A pneumatic tourniquet was  applied to proximal brachium.  1 gram of Ancef was administered as IV  prophylactic antibiotic followed by routine Betadine scrub and paint.  Following placement of sterile stockinette and arthroscopy drapes.  The  left arm was exsanguinated with Esmarch bandage and the arterial  tourniquet inflated to 240 mmHg.  Procedure commenced with a Wagner  curvilinear incision exposing the thenar musculature.  After careful  dissection of the radial superficial sensory branches.  A full-thickness  flap was created elevating the abductor pollicis brevis and opponens  pollicis, splitting the anterior slip of the abductor pollicis longus  tendon.  The extensor pollicis brevis and posterior slip of the abductor  pollicis longus were left intact.   The trapezium was exposed subperiosteally followed by morcellation and  piecemeal removal with complete synovectomy the [facility] joint.  Care was  taken protect the radial artery and the flexor carpi radialis during   trapezium resection.   Appropriately sized drill holes were created through the base of the  index and long finger metacarpal utilizing a pilot 0.035-inch Kirschner  wire and Arthrex drill guide followed by passage of an Arthrex tight  rope device to create a suturedesis between the index and thumb  metacarpals.   1. Half of the flexor carpi radialis was harvested from a transverse      incision in the mid forearm with muscle and tendon splitting      technique creating a 12 cm long distally based tendon graft      attached at the base of the trapezoid and index metacarpal.   This tendon graft was then drawn up into the drill hole into the index  metacarpal utilizing a 2-0 suture of FiberWire placed over the dorsal  tight rope button and after appropriate tensioning the grafts the  remainder of the graft was brought to the base of thumb metacarpal  looped and tied with an overhand knot between the index and thumb  metacarpals to create interposition soft tissue arthroplasty.   The tail of the tendon graft was then woven on the deep surface of the  thumb metacarpal through the remaining portion of the tendon graft and  double knotted and with a series of mattress sutures of 3-0 Ethilon.   This created a very satisfactory intermetacarpal ligament  reconstruction.  This was protected with the Arthrex tight rope  construct that was appropriately tensioned to provide about 3 mm of  space between the index and thumb metacarpals.   PA and lateral C-arm images were obtained documenting very satisfactory  suspension of the thumb metacarpal and an anatomic position opposite the  index metacarpal.   The remaining tendon graft was then placed as an interposition  arthroplasty in the cavity created by trapezium resection.  Hemostasis  was achieved followed by closure of the multiple wounds with subdermal suture of 4-0 Vicryl at the __________ incision and dorsal incision for  placement of the  button on the index metacarpal.  The forearm incision  was repaired with an intradermal 3-0 Prolene and Steri-Strips.   There no apparent complications.   [name] tolerated surgery and anesthesia well.  She was transferred  recovery room with stable vital signs.   She will be discharged to care of her family taking routine  prescriptions which are noted on the reconciliation sheet.  In addition  will be provided prescription for Dilaudid 2 mg one or two tablets p.o.  4 to 6 hours p.r.n., Motrin 600 mg one p.o. q.6 h. as needed for pain  and Keflex 500 mg one p.o. q.8 h x4 days as prophylactic antibiotic.      Katy Fitch Sypher, M.D.  Electronically Signed     RVS/MEDQ  D:  07/25/2006  T:  07/25/2006  Job:  [id number]   cc:   Soyla Murphy. Renne Crigler, M.D.

## 2010-09-17 ENCOUNTER — Other Ambulatory Visit: Payer: Self-pay | Admitting: Dermatology

## 2011-01-05 ENCOUNTER — Ambulatory Visit (HOSPITAL_BASED_OUTPATIENT_CLINIC_OR_DEPARTMENT_OTHER)
Admission: RE | Admit: 2011-01-05 | Discharge: 2011-01-05 | Disposition: A | Discharge status: Home / Self Care | Payer: Medicare Other | Source: Ambulatory Visit | Attending: Orthopedic Surgery | Admitting: Orthopedic Surgery

## 2011-01-05 DIAGNOSIS — X58XXXA Exposure to other specified factors, initial encounter: Secondary | ICD-10-CM | POA: Insufficient documentation

## 2011-01-05 DIAGNOSIS — Z7982 Long term (current) use of aspirin: Secondary | ICD-10-CM | POA: Insufficient documentation

## 2011-01-05 DIAGNOSIS — Z79899 Other long term (current) drug therapy: Secondary | ICD-10-CM | POA: Insufficient documentation

## 2011-01-05 DIAGNOSIS — I10 Essential (primary) hypertension: Secondary | ICD-10-CM | POA: Insufficient documentation

## 2011-01-05 DIAGNOSIS — IMO0002 Reserved for concepts with insufficient information to code with codable children: Secondary | ICD-10-CM | POA: Insufficient documentation

## 2011-01-05 DIAGNOSIS — I2510 Atherosclerotic heart disease of native coronary artery without angina pectoris: Secondary | ICD-10-CM | POA: Insufficient documentation

## 2011-01-05 LAB — POCT I-STAT 4, (NA,K, GLUC, HGB,HCT)
Glucose, Bld: 98 mg/dL (ref 70–99)
HCT: 41 % (ref 36.0–46.0)
Potassium: 3.9 mEq/L (ref 3.5–5.1)
Sodium: 142 mEq/L (ref 135–145)

## 2011-01-07 NOTE — Op Note (Signed)
  [name], [name]                ACCOUNT NO.:  [id number]  MEDICAL RECORD NO.:  [id number]  LOCATION:                               FACILITY:  [facility]  PHYSICIAN:  Ollen Gross, M.D.    DATE OF BIRTH:  [birth year]  DATE OF PROCEDURE:  01/05/2011 DATE OF DISCHARGE:                              OPERATIVE REPORT   PREOPERATIVE DIAGNOSIS:  Left knee medial meniscal tear.  POSTOPERATIVE DIAGNOSIS:  Left knee medial meniscal tear.  PROCEDURE:  Left knee arthroscopy with medial meniscal debridement.  SURGEON:  Ollen Gross, M.D.  ASSISTANT:  none.  ANESTHESIA:  General.  ESTIMATED BLOOD LOSS:  Minimal.  DRAINS:  None.  COMPLICATIONS:  None.  CONDITION:  Stable to Recovery.  BRIEF CLINICAL INDICATION:  Ms. [name] is a 65 year old female with a several-month history of significant left knee pain and mechanical-type symptoms.  Exam and history suggested a medial meniscal tear, confirmed by MRI.  She presents now for arthroscopy with debridement.  PROCEDURE IN DETAIL:  After successful administration of general anesthetic, a tourniquet was placed high on her left thigh and her left lower extremity was prepped and draped in the usual sterile fashion. Standard superomedial and inferolateral incisions were made, inflow cannula passed, superomedial camera passed inferolateral.  Arthroscopic visualization proceeds.  Undersurface of the patella and the trochlea looked normal.  Medial and lateral gutters were visualized, there were no loose bodies.  In flexion, valgus force was applied to the knee and the medial compartment was entered.  There was evidence of a tear in the body and posterior horn of the medial meniscus.  There was also some chondromalacia grade 2 and 3 on the medial femoral condyle.  Spinal needle was used to localize the inferomedial portal, small incision was made, and dilator placed.  The meniscus was debrided back to stable base with baskets and a 4.2 mm shaver.   It was then sealed off with an ArthroCare device.  This cartilage on the surface of medial femoral condyle inspected.  There was a tiny area with a little unstable cartilage and it was debrided back to a stable base.  She had about a 1 x 1 cm area of grade 3 change, otherwise the rest of the condyle looked fairly normal.  The intercondylar notch was visualized.  The ACL was normal.  Lateral compartment was entered and it looked normal.  The joint skin inspected.  No other tears, defects, or loose bodies were noted.  The arthroscopic equipment was then removed from the inferior portals, which were closed with interrupted 4-0 nylon.  20 cc of 0.25% Marcaine with epi injected through the inflow cannula and that was removed and that portal closed with nylon.  The incision was then cleaned and dried and a bulky sterile dressing was applied.  the patient was then awakened and transported to recovery in stable condition.     Ollen Gross, M.D.     FA/MEDQ  D:  01/05/2011  T:  01/06/2011  Job:  [id number]  Electronically Signed by Ollen Gross M.D. on 01/07/2011 04:23:09 PM

## 2011-04-01 DIAGNOSIS — IMO0002 Reserved for concepts with insufficient information to code with codable children: Secondary | ICD-10-CM | POA: Diagnosis not present

## 2011-04-04 DIAGNOSIS — IMO0002 Reserved for concepts with insufficient information to code with codable children: Secondary | ICD-10-CM | POA: Diagnosis not present

## 2011-05-11 DIAGNOSIS — R509 Fever, unspecified: Secondary | ICD-10-CM | POA: Diagnosis not present

## 2011-05-11 DIAGNOSIS — J42 Unspecified chronic bronchitis: Secondary | ICD-10-CM | POA: Diagnosis not present

## 2011-05-18 DIAGNOSIS — G35 Multiple sclerosis: Secondary | ICD-10-CM | POA: Diagnosis not present

## 2011-05-27 DIAGNOSIS — R059 Cough, unspecified: Secondary | ICD-10-CM | POA: Diagnosis not present

## 2011-05-27 DIAGNOSIS — R05 Cough: Secondary | ICD-10-CM | POA: Diagnosis not present

## 2011-05-27 DIAGNOSIS — Z0000 Encounter for general adult medical examination without abnormal findings: Secondary | ICD-10-CM | POA: Diagnosis not present

## 2011-05-27 DIAGNOSIS — A3700 Whooping cough due to Bordetella pertussis without pneumonia: Secondary | ICD-10-CM | POA: Diagnosis not present

## 2011-06-02 DIAGNOSIS — R05 Cough: Secondary | ICD-10-CM | POA: Diagnosis not present

## 2011-06-02 DIAGNOSIS — J069 Acute upper respiratory infection, unspecified: Secondary | ICD-10-CM | POA: Diagnosis not present

## 2011-06-02 DIAGNOSIS — R059 Cough, unspecified: Secondary | ICD-10-CM | POA: Diagnosis not present

## 2011-06-03 DIAGNOSIS — R0609 Other forms of dyspnea: Secondary | ICD-10-CM | POA: Diagnosis not present

## 2011-06-08 DIAGNOSIS — R05 Cough: Secondary | ICD-10-CM | POA: Diagnosis not present

## 2011-06-08 DIAGNOSIS — J209 Acute bronchitis, unspecified: Secondary | ICD-10-CM | POA: Diagnosis not present

## 2011-06-08 DIAGNOSIS — R059 Cough, unspecified: Secondary | ICD-10-CM | POA: Diagnosis not present

## 2011-06-09 DIAGNOSIS — I10 Essential (primary) hypertension: Secondary | ICD-10-CM | POA: Diagnosis not present

## 2011-06-09 DIAGNOSIS — E782 Mixed hyperlipidemia: Secondary | ICD-10-CM | POA: Diagnosis not present

## 2011-06-09 DIAGNOSIS — M810 Age-related osteoporosis without current pathological fracture: Secondary | ICD-10-CM | POA: Diagnosis not present

## 2011-06-09 DIAGNOSIS — Z79899 Other long term (current) drug therapy: Secondary | ICD-10-CM | POA: Diagnosis not present

## 2011-06-09 DIAGNOSIS — K219 Gastro-esophageal reflux disease without esophagitis: Secondary | ICD-10-CM | POA: Diagnosis not present

## 2011-06-15 DIAGNOSIS — M899 Disorder of bone, unspecified: Secondary | ICD-10-CM | POA: Diagnosis not present

## 2011-06-15 DIAGNOSIS — F411 Generalized anxiety disorder: Secondary | ICD-10-CM | POA: Diagnosis not present

## 2011-06-15 DIAGNOSIS — I2510 Atherosclerotic heart disease of native coronary artery without angina pectoris: Secondary | ICD-10-CM | POA: Diagnosis not present

## 2011-06-15 DIAGNOSIS — I10 Essential (primary) hypertension: Secondary | ICD-10-CM | POA: Diagnosis not present

## 2011-06-16 ENCOUNTER — Other Ambulatory Visit: Payer: Self-pay | Admitting: Internal Medicine

## 2011-06-16 DIAGNOSIS — R1900 Intra-abdominal and pelvic swelling, mass and lump, unspecified site: Secondary | ICD-10-CM

## 2011-06-20 ENCOUNTER — Ambulatory Visit
Admission: RE | Admit: 2011-06-20 | Discharge: 2011-06-20 | Disposition: A | Discharge status: Home / Self Care | Payer: Medicare Other | Source: Ambulatory Visit | Attending: Internal Medicine | Admitting: Internal Medicine

## 2011-06-20 DIAGNOSIS — R079 Chest pain, unspecified: Secondary | ICD-10-CM | POA: Diagnosis not present

## 2011-06-20 DIAGNOSIS — I10 Essential (primary) hypertension: Secondary | ICD-10-CM | POA: Diagnosis not present

## 2011-06-20 DIAGNOSIS — R1900 Intra-abdominal and pelvic swelling, mass and lump, unspecified site: Secondary | ICD-10-CM

## 2011-06-20 DIAGNOSIS — R002 Palpitations: Secondary | ICD-10-CM | POA: Diagnosis not present

## 2011-06-20 DIAGNOSIS — N83209 Unspecified ovarian cyst, unspecified side: Secondary | ICD-10-CM | POA: Diagnosis not present

## 2011-07-01 ENCOUNTER — Inpatient Hospital Stay (HOSPITAL_COMMUNITY)
Admission: EM | Admit: 2011-07-01 | Discharge: 2011-07-03 | DRG: 392 | Disposition: A | Discharge status: Home / Self Care | Payer: Medicare Other | Attending: Internal Medicine | Admitting: Internal Medicine

## 2011-07-01 ENCOUNTER — Other Ambulatory Visit: Payer: Self-pay

## 2011-07-01 ENCOUNTER — Encounter (HOSPITAL_COMMUNITY): Payer: Self-pay | Admitting: Family Medicine

## 2011-07-01 DIAGNOSIS — R110 Nausea: Secondary | ICD-10-CM | POA: Diagnosis not present

## 2011-07-01 DIAGNOSIS — R55 Syncope and collapse: Secondary | ICD-10-CM | POA: Diagnosis present

## 2011-07-01 DIAGNOSIS — R221 Localized swelling, mass and lump, neck: Secondary | ICD-10-CM | POA: Diagnosis not present

## 2011-07-01 DIAGNOSIS — N83209 Unspecified ovarian cyst, unspecified side: Secondary | ICD-10-CM | POA: Diagnosis present

## 2011-07-01 DIAGNOSIS — R5381 Other malaise: Secondary | ICD-10-CM | POA: Diagnosis not present

## 2011-07-01 DIAGNOSIS — R5383 Other fatigue: Secondary | ICD-10-CM | POA: Diagnosis not present

## 2011-07-01 DIAGNOSIS — R220 Localized swelling, mass and lump, head: Secondary | ICD-10-CM | POA: Diagnosis not present

## 2011-07-01 DIAGNOSIS — I2510 Atherosclerotic heart disease of native coronary artery without angina pectoris: Secondary | ICD-10-CM | POA: Diagnosis present

## 2011-07-01 DIAGNOSIS — R109 Unspecified abdominal pain: Secondary | ICD-10-CM | POA: Diagnosis present

## 2011-07-01 DIAGNOSIS — R112 Nausea with vomiting, unspecified: Secondary | ICD-10-CM | POA: Diagnosis not present

## 2011-07-01 DIAGNOSIS — R531 Weakness: Secondary | ICD-10-CM | POA: Diagnosis present

## 2011-07-01 DIAGNOSIS — T50905A Adverse effect of unspecified drugs, medicaments and biological substances, initial encounter: Secondary | ICD-10-CM

## 2011-07-01 DIAGNOSIS — E876 Hypokalemia: Secondary | ICD-10-CM | POA: Diagnosis present

## 2011-07-01 DIAGNOSIS — R197 Diarrhea, unspecified: Secondary | ICD-10-CM | POA: Diagnosis present

## 2011-07-01 DIAGNOSIS — D649 Anemia, unspecified: Secondary | ICD-10-CM | POA: Diagnosis present

## 2011-07-01 DIAGNOSIS — G35 Multiple sclerosis: Secondary | ICD-10-CM | POA: Diagnosis present

## 2011-07-01 DIAGNOSIS — A088 Other specified intestinal infections: Principal | ICD-10-CM | POA: Diagnosis present

## 2011-07-01 DIAGNOSIS — D696 Thrombocytopenia, unspecified: Secondary | ICD-10-CM | POA: Diagnosis present

## 2011-07-01 HISTORY — DX: Dyskinesia of esophagus: K22.4

## 2011-07-01 HISTORY — DX: Multiple sclerosis: G35

## 2011-07-01 LAB — URINALYSIS, ROUTINE W REFLEX MICROSCOPIC
Glucose, UA: NEGATIVE mg/dL
Leukocytes, UA: NEGATIVE
Protein, ur: NEGATIVE mg/dL
pH: 6 (ref 5.0–8.0)

## 2011-07-01 LAB — CBC
HCT: 42.2 % (ref 36.0–46.0)
MCH: 30.1 pg (ref 26.0–34.0)
MCV: 89.4 fL (ref 78.0–100.0)
Platelets: 155 10*3/uL (ref 150–400)
RDW: 13.4 % (ref 11.5–15.5)

## 2011-07-01 LAB — POCT I-STAT, CHEM 8
BUN: 38 mg/dL — ABNORMAL HIGH (ref 6–23)
Sodium: 140 mEq/L (ref 135–145)
TCO2: 22 mmol/L (ref 0–100)

## 2011-07-01 MED ORDER — ONDANSETRON HCL 4 MG/2ML IJ SOLN
4.0000 mg | Freq: Once | INTRAMUSCULAR | Status: AC
  Administered 2011-07-01: 4 mg via INTRAVENOUS
  Filled 2011-07-01: qty 2

## 2011-07-01 MED ORDER — SODIUM CHLORIDE 0.9 % IV BOLUS (SEPSIS)
250.0000 mL | Freq: Once | INTRAVENOUS | Status: AC
  Administered 2011-07-01: 250 mL via INTRAVENOUS

## 2011-07-01 MED ORDER — MORPHINE SULFATE 4 MG/ML IJ SOLN
4.0000 mg | Freq: Once | INTRAMUSCULAR | Status: AC
  Administered 2011-07-01: 4 mg via INTRAVENOUS
  Filled 2011-07-01: qty 1

## 2011-07-01 NOTE — ED Notes (Signed)
Pt reports she had root canal done last week. States today progressively getting worse with abdominal pain, nausea, diaphoresis, headache, diarrhea. Pt states "something is wrong" over and over again.

## 2011-07-01 NOTE — ED Provider Notes (Addendum)
History     CSN: [id number]  Arrival date & time 07/01/11  1844   First MD Initiated Contact with Patient 07/01/11 2103      Chief Complaint  Patient presents with  . Nausea  . Dizziness  . Headache  . Abdominal Pain    (Consider location/radiation/quality/duration/timing/severity/associated sxs/prior treatment) HPI Comments: Is a 66 year old female, who 10 days ago.  Had root canal on left of 2 teeth 3 days later she was placed on antibiotic for continued pain.  Some swelling also prescribed Percocet and ibuprofen.  She is took Microbiologist for 2 days.  She finished her antibiotics 2 days ago, but she has not felt well since the procedure, today.  She's had increased nausea, dizziness, headache, diffuse abdominal pain, normal bowel movements.  Denies dysuria, vomiting, but does report continued pain and swelling left lower jawline, despite antibiotic use She has also been taking Green coffee bean extract  Patient is a 66 y.o. female presenting with headaches and abdominal pain. The history is provided by the patient and a relative.  Headache  This is a new problem. The current episode started more than 2 days ago. The problem occurs constantly. The problem has been gradually worsening.  Abdominal Pain The primary symptoms of the illness include abdominal pain.    Past Medical History  Diagnosis Date  . MS (multiple sclerosis)   . Esophageal spasm     History reviewed. No pertinent past surgical history.  History reviewed. No pertinent family history.  History  Substance Use Topics  . Smoking status: Not on file  . Smokeless tobacco: Not on file  . Alcohol Use:     OB History    Grav Para Term Preterm Abortions TAB SAB Ect Mult Living                  Review of Systems  Gastrointestinal: Positive for abdominal pain.  Neurological: Positive for headaches.    Allergies  Vicodin  Home Medications   Current Outpatient Rx  Name Route Sig Dispense Refill  .  AMLODIPINE BESYLATE 10 MG PO TABS Oral Take 10 mg by mouth daily.    . ASPIRIN EC 81 MG PO TBEC Oral Take 81 mg by mouth daily.    [name] CARVEDILOL 6.25 MG PO TABS Oral Take 6.25 mg by mouth 2 (two) times daily with a meal.    . GREEN TEA (CAMILLIA SINENSIS) 150 MG PO CAPS Oral Take 150 mg by mouth daily.    . IBUPROFEN 800 MG PO TABS Oral Take 800 mg by mouth every 8 (eight) hours as needed. For tooth pain      BP 104/90  Pulse 86  Temp(Src) 98.3 F (36.8 C) (Oral)  Resp 26  SpO2 98%  Physical Exam  Constitutional: She is oriented to person, place, and time. She appears well-developed.  Eyes: Pupils are equal, round, and reactive to light.  Neck: Normal range of motion.  Cardiovascular: Normal rate.   Pulmonary/Chest: Effort normal.  Abdominal: Soft.  Musculoskeletal: Normal range of motion.  Neurological: She is alert and oriented to person, place, and time.  Skin: Skin is warm and dry.    ED Course  Procedures (including critical care time)  Labs Reviewed  GLUCOSE, CAPILLARY - Abnormal; Notable for the following:    Glucose-Capillary 117 (*)    All other components within normal limits  CBC  URINALYSIS, ROUTINE W REFLEX MICROSCOPIC   No results found.   No diagnosis found. ED ECG  REPORT   Date: 07/02/2011  EKG Time: 2:51 AM  Rate:84  Rhythm: normal sinus rhythm,  unchanged from previous tracings  Axis: normal  Intervals:none  ST&T Change: borderline T wave abnormalities  Narrative Interpretation: borderline           Patient states her headache has improved, but she still having some left to jaw pain from her root canal and slight nausea and generalized diffuse abdominal pain  MDM  Adverse drug effects Green bean coffee, near syncope , vague abdominal discomfort         Arman Filter, NP 07/02/11 0507  Arman Filter, NP 08/18/11 2012

## 2011-07-01 NOTE — ED Notes (Signed)
Pt is refusing in and out cath, nurse states to let her call for bedpan or help to bathroom

## 2011-07-02 ENCOUNTER — Inpatient Hospital Stay (HOSPITAL_COMMUNITY): Payer: Medicare Other

## 2011-07-02 ENCOUNTER — Emergency Department (HOSPITAL_COMMUNITY): Payer: Medicare Other

## 2011-07-02 ENCOUNTER — Encounter (HOSPITAL_COMMUNITY): Payer: Self-pay | Admitting: Internal Medicine

## 2011-07-02 DIAGNOSIS — K529 Noninfective gastroenteritis and colitis, unspecified: Secondary | ICD-10-CM | POA: Diagnosis not present

## 2011-07-02 DIAGNOSIS — D696 Thrombocytopenia, unspecified: Secondary | ICD-10-CM | POA: Diagnosis present

## 2011-07-02 DIAGNOSIS — D649 Anemia, unspecified: Secondary | ICD-10-CM | POA: Diagnosis present

## 2011-07-02 DIAGNOSIS — R5381 Other malaise: Secondary | ICD-10-CM | POA: Diagnosis present

## 2011-07-02 DIAGNOSIS — R1084 Generalized abdominal pain: Secondary | ICD-10-CM | POA: Diagnosis not present

## 2011-07-02 DIAGNOSIS — R55 Syncope and collapse: Secondary | ICD-10-CM

## 2011-07-02 DIAGNOSIS — R197 Diarrhea, unspecified: Secondary | ICD-10-CM | POA: Diagnosis not present

## 2011-07-02 DIAGNOSIS — R531 Weakness: Secondary | ICD-10-CM | POA: Diagnosis present

## 2011-07-02 DIAGNOSIS — A088 Other specified intestinal infections: Secondary | ICD-10-CM | POA: Diagnosis present

## 2011-07-02 DIAGNOSIS — R112 Nausea with vomiting, unspecified: Secondary | ICD-10-CM | POA: Diagnosis not present

## 2011-07-02 DIAGNOSIS — R109 Unspecified abdominal pain: Secondary | ICD-10-CM | POA: Diagnosis not present

## 2011-07-02 DIAGNOSIS — G35 Multiple sclerosis: Secondary | ICD-10-CM | POA: Diagnosis present

## 2011-07-02 DIAGNOSIS — R110 Nausea: Secondary | ICD-10-CM | POA: Diagnosis not present

## 2011-07-02 DIAGNOSIS — N83209 Unspecified ovarian cyst, unspecified side: Secondary | ICD-10-CM | POA: Diagnosis present

## 2011-07-02 DIAGNOSIS — I2510 Atherosclerotic heart disease of native coronary artery without angina pectoris: Secondary | ICD-10-CM | POA: Diagnosis present

## 2011-07-02 DIAGNOSIS — E876 Hypokalemia: Secondary | ICD-10-CM | POA: Diagnosis not present

## 2011-07-02 DIAGNOSIS — R220 Localized swelling, mass and lump, head: Secondary | ICD-10-CM | POA: Diagnosis not present

## 2011-07-02 HISTORY — PX: TRANSTHORACIC ECHOCARDIOGRAM: SHX275

## 2011-07-02 HISTORY — PX: OTHER SURGICAL HISTORY: SHX169

## 2011-07-02 LAB — POTASSIUM: Potassium: 4.1 mEq/L (ref 3.5–5.1)

## 2011-07-02 LAB — CLOSTRIDIUM DIFFICILE BY PCR: Toxigenic C. Difficile by PCR: NEGATIVE

## 2011-07-02 LAB — CARDIAC PANEL(CRET KIN+CKTOT+MB+TROPI)
CK, MB: 1.7 ng/mL (ref 0.3–4.0)
Troponin I: <0.3 ng/mL (ref <0.30)

## 2011-07-02 LAB — LIPASE, BLOOD: Lipase: 35 U/L (ref 11–59)

## 2011-07-02 MED ORDER — ONDANSETRON HCL 4 MG PO TABS: 4.0000 mg | ORAL_TABLET | Freq: Four times a day (QID) | ORAL | Status: DC | PRN

## 2011-07-02 MED ORDER — ASPIRIN EC 81 MG PO TBEC
81.0000 mg | DELAYED_RELEASE_TABLET | Freq: Every day | ORAL | Status: DC
  Administered 2011-07-02 – 2011-07-03 (×2): 81 mg via ORAL
  Filled 2011-07-02 (×4): qty 1

## 2011-07-02 MED ORDER — ONDANSETRON HCL 4 MG/2ML IJ SOLN
4.0000 mg | Freq: Once | INTRAMUSCULAR | Status: AC
  Administered 2011-07-02: 4 mg via INTRAVENOUS
  Filled 2011-07-02: qty 2

## 2011-07-02 MED ORDER — ONDANSETRON HCL 4 MG/2ML IJ SOLN
4.0000 mg | Freq: Four times a day (QID) | INTRAMUSCULAR | Status: DC | PRN
  Administered 2011-07-02: 4 mg via INTRAVENOUS
  Filled 2011-07-02: qty 2

## 2011-07-02 MED ORDER — CARVEDILOL 6.25 MG PO TABS
6.2500 mg | ORAL_TABLET | Freq: Two times a day (BID) | ORAL | Status: DC
  Administered 2011-07-02 – 2011-07-03 (×3): 6.25 mg via ORAL
  Filled 2011-07-02 (×7): qty 1

## 2011-07-02 MED ORDER — IOHEXOL 300 MG/ML  SOLN
100.0000 mL | Freq: Once | INTRAMUSCULAR | Status: AC | PRN
  Administered 2011-07-02: 100 mL via INTRAVENOUS

## 2011-07-02 MED ORDER — ACETAMINOPHEN 325 MG PO TABS
650.0000 mg | ORAL_TABLET | Freq: Four times a day (QID) | ORAL | Status: DC | PRN
  Administered 2011-07-02 (×2): 650 mg via ORAL
  Filled 2011-07-02 (×2): qty 2

## 2011-07-02 MED ORDER — SODIUM CHLORIDE 0.9 % IJ SOLN
3.0000 mL | Freq: Two times a day (BID) | INTRAMUSCULAR | Status: DC
  Administered 2011-07-02: 3 mL via INTRAVENOUS

## 2011-07-02 MED ORDER — AMLODIPINE BESYLATE 10 MG PO TABS
10.0000 mg | ORAL_TABLET | Freq: Every day | ORAL | Status: DC
  Administered 2011-07-02 – 2011-07-03 (×2): 10 mg via ORAL
  Filled 2011-07-02 (×4): qty 1

## 2011-07-02 MED ORDER — MORPHINE SULFATE 4 MG/ML IJ SOLN
4.0000 mg | Freq: Once | INTRAMUSCULAR | Status: AC
  Filled 2011-07-02: qty 1

## 2011-07-02 MED ORDER — DEXTROSE-NACL 5-0.9 % IV SOLN
INTRAVENOUS | Status: DC
  Administered 2011-07-02 (×2): 125 mL/h via INTRAVENOUS
  Administered 2011-07-03 (×2): via INTRAVENOUS

## 2011-07-02 NOTE — Progress Notes (Signed)
Subjective:   Chart reviewed. Patient complains of 8 episodes of watery diarrhea without blood or mucus since this morning. Nausea but no vomiting. No abdominal pain. Mild, a 4/10 frontal headache. No neck pain. She completed a course of ampicillin on Thursday and started having diarrhea on Friday.  Objective  Vital signs in last 24 hours: Filed Vitals:   07/02/11 0011 07/02/11 0144 07/02/11 0550 07/02/11 0831  BP: 113/59  120/65 119/72  Pulse: 78  79 79  Temp:  98.6 F (37 C) 98 F (36.7 C) 99.1 F (37.3 C)  TempSrc:    Oral  Resp: 20  20 20   Height:    5\' 3"  (1.6 m)  Weight:    69.264 kg (152 lb 11.2 oz)  SpO2: 96%  97% 95%   Weight change:   Intake/Output Summary (Last 24 hours) at 07/02/11 1427 Last data filed at 07/02/11 1100  Gross per 24 hour  Intake    250 ml  Output      5 ml  Net    245 ml    Physical Exam:  General Exam: Comfortable.  Respiratory System: Clear. No increased work of breathing.  Cardiovascular System: First and second heart sounds heard. Regular rate and rhythm. No JVD/murmurs.  Gastrointestinal System: Abdomen is non distended, soft and normal bowel sounds heard. Nontender. Central Nervous System: Alert and oriented. No focal neurological deficits. Extremities: 5 x 5 power. No pedal edema.  Labs:  Basic Metabolic Panel:  Lab 07/01/11 1610 07/01/11 2308  NA -- 140  K 4.1 6.6*  CL -- 109  CO2 -- --  GLUCOSE -- 128*  BUN -- 38*  CREATININE -- 0.80  CALCIUM -- --  ALB -- --  PHOS -- --   Liver Function Tests: No results found for this basename: AST:3,ALT:3,ALKPHOS:3,BILITOT:3,PROT:3,ALBUMIN:3 in the last 168 hours  Lab 07/02/11 0937  LIPASE 35  AMYLASE --   No results found for this basename: AMMONIA:3 in the last 168 hours CBC:  Lab 07/01/11 2308 07/01/11 2140  WBC -- 10.1  NEUTROABS -- --  HGB 14.3 14.2  HCT 42.0 42.2  MCV -- 89.4  PLT -- 155   Cardiac Enzymes:  Lab 07/02/11 0937  CKTOTAL 50  CKMB 1.8  CKMBINDEX --   TROPONINI <0.30   CBG:  Lab 07/01/11 1943  GLUCAP 117*    Iron Studies: No results found for this basename: IRON,TIBC,TRANSFERRIN,FERRITIN in the last 72 hours Studies/Results: Dg Orthopantogram  07/02/2011  *RADIOLOGY REPORT*  Clinical Data: Left lower gum swelling/pain, status post root canal 10 days ago  [facility]  Comparison: None.  Findings: Suspected small periapical lucency involving the left lower second molar.  IMPRESSION: Suspected small periapical lucency involving the left lower second molar.  Original Report Authenticated By: Charline Bills, M.D.   Ct Abdomen Pelvis W Contrast  07/02/2011  *RADIOLOGY REPORT*  Clinical Data: Vague abdominal pain, nausea, diarrhea, prior hysterectomy  CT ABDOMEN AND PELVIS WITH CONTRAST  Technique:  Multidetector CT imaging of the abdomen and pelvis was performed following the standard protocol during bolus administration of intravenous contrast.  Contrast: OMNIPAQUE IOHEXOL 300 MG/ML  SOLN  Comparison: None.  Findings: Mild linear scarring and dependent atelectasis in the right lower lobe.  Two tiny probable hepatic cysts (series 2/image 19).  Spleen and adrenal glands are within normal limits.  Pancreas is notable for a mildly prominent pancreatic duct and suspected pancreatic divisum.  Gallbladder is unremarkable.  No intrahepatic or extrahepatic ductal dilatation.  Small right upper pole cyst (series 2/image 32).  Left kidney is within normal limits.  No hydronephrosis.  No evidence of bowel obstruction.  Normal appendix.  Atherosclerotic calcifications of the abdominal aorta and branch vessels.  No abdominopelvic ascites.  No suspicious abdominopelvic lymphadenopathy.  Status post hysterectomy.  Bilateral ovaries are within normal limits.  Bladder is unremarkable.  Mild degenerative changes of the visualized thoracolumbar spine.  IMPRESSION: Normal appendix.  No evidence of bowel obstruction.  Mildly prominent main pancreatic duct  with suspected pancreatic divisum.  Consider nonemergent MRI/MRCP abdomen with/without contrast for confirmation as clinically warranted.  No CT findings to account for the patient's abdominal pain.  Original Report Authenticated By: Charline Bills, M.D.   Medications:    . dextrose 5 % and 0.9% NaCl 125 mL/hr (07/02/11 0909)      . amLODipine  10 mg Oral Daily  . aspirin EC  81 mg Oral Daily  . carvedilol  6.25 mg Oral BID WC  .  morphine injection  4 mg Intravenous Once  .  morphine injection  4 mg Intravenous Once  . ondansetron  4 mg Intravenous Once  . ondansetron  4 mg Intravenous Once  . sodium chloride  250 mL Intravenous Once  . sodium chloride  3 mL Intravenous Q12H    I  have reviewed scheduled and prn medications.  EKG: Sinus rhythm at 84 beats per minute, normal axis, no acute changes seen QTC 430 ms.   Problem/Plan: Principal Problem:  *Nausea & vomiting Active Problems:  Syncope and collapse  Abdominal pain  Ovarian cyst  Weakness generalized  Diarrhea  1. Acute onset of diarrhea and nausea:? Acute viral gastroenteritis. Rule out C. difficile colitis. Follow C. difficile PCR results which have been sent. Symptomatic management. Continue IV fluids. Denies further abdominal pain. No acute findings on CT abdomen. Lipase normal. 2. Syncope:? Secondary to orthostatic hypotension. Check orthostatic blood pressures. Followup 2-D echocardiogram results. We'll also check carotid Dopplers. Out of bed with assistance. 3. ? Hypertension: Continue amlodipine and Coreg. 4. Mild CAD: continue Coreg and ASA. CE's negative. No CP. No acute EKG changes.  Kenitra Leventhal 07/02/2011,2:27 PM  LOS: 1 day

## 2011-07-02 NOTE — ED Notes (Signed)
Report already given to Ms. Dahlia Client, RN of [phone],  Transfer pending for CT scan procedure that was scheduled at 06:15. Receiving Nurse and ED Charge Nurse notified.

## 2011-07-02 NOTE — H&P (Signed)
PCP: None   Chief Complaint: Multiple vague complaints, but mainly  nausea and abdominal pain.   HPI: [name] is an 66 y.o. female with history of recently diagnosed ovarian cyst (as per patient), hypertension, esophageal spasm, MS, presents to the emergency room with multiple vague complaints. She has not been feeling well since yesterday, having abdominal pain and nausea, lightheadedness, and thought she had a syncopal episode, she also has mild headache, but no visual complaints, no stiff neck. She also has had some diarrhea, but no fever, chills, black stool or bloody stool. She did not complain of vertigo, focal weakness, slurred speech, or any facial droop. She said she felt a sense of not well without chest pain, shortness of breath, or palpitation. She took green coffee bean supplements to lose weight and stated her symptoms started since taking the supplement. She drank only socially and denies tobacco or illicit drug use. She has no ill contact or any distant travel. She also has complained of intermittent lower extremity petechial rash which she relates to social events such as the weddings of her children. Workup in the emergency room included original potassium was 6.6, with repeat normal, normal white count, normal hemoglobin, normal renal function tests, blood glucose of 117, and a negative urinalysis. Hospitalist was asked to admit her for observation and further workup on her several complaints above.  Rewiew of Systems:  The patient denies anorexia, fever, weight loss,, vision loss, decreased hearing, hoarseness, chest pain,  dyspnea on exertion, peripheral edema, , hemoptysis, , melena, hematochezia, severe indigestion/heartburn, hematuria, incontinence, genital sores, muscle weakness, suspicious skin lesions, transient blindness,depression, unusual weight change, abnormal bleeding, enlarged lymph nodes, angioedema, and breast masses.    Past Medical History  Diagnosis Date    . MS (multiple sclerosis)   . Esophageal spasm     History reviewed. No pertinent past surgical history.  Medications:  HOME MEDS: Prior to Admission medications   Medication Sig Start Date End Date Taking? Authorizing Provider  amLODipine (NORVASC) 10 MG tablet Take 10 mg by mouth daily.   Yes Historical Provider, MD  aspirin EC 81 MG tablet Take 81 mg by mouth daily.   Yes Historical Provider, MD  carvedilol (COREG) 6.25 MG tablet Take 6.25 mg by mouth 2 (two) times daily with a meal.   Yes Historical Provider, MD  Chilton Si Tea, Camillia sinensis, 150 MG CAPS Take 150 mg by mouth daily.   Yes Historical Provider, MD  ibuprofen (ADVIL,MOTRIN) 800 MG tablet Take 800 mg by mouth every 8 (eight) hours as needed. For tooth pain   Yes Historical Provider, MD     Allergies:  Allergies  Allergen Reactions  . Vicodin (Hydrocodone-Acetaminophen)     Social History:   does not have a smoking history on file. She does not have any smokeless tobacco history on file. Her alcohol and drug histories not on file.  Family History: History reviewed. No pertinent family history.   Physical Exam: Filed Vitals:   07/01/11 1919 07/01/11 2219 07/02/11 0011 07/02/11 0144  BP: 104/90 128/73 113/59   Pulse: 86 88 78   Temp: 98.3 F (36.8 C)   98.6 F (37 C)  TempSrc: Oral     Resp: 26  20   SpO2: 98% 97% 96%    Blood pressure 113/59, pulse 78, temperature 98.6 F (37 C), temperature source Oral, resp. rate 20, SpO2 96.00%.  GEN:  Pleasant person lying in the stretcher in no acute distress; cooperative with exam.  She appears quite weak PSYCH:  alert and oriented x4; does not appear anxious or depressed; affect is appropriate. HEENT: Mucous membranes pink and anicteric; PERRLA; EOM intact; no cervical lymphadenopathy nor thyromegaly or carotid bruit; no JVD; Breasts:: Not examined CHEST WALL: No tenderness CHEST: Normal respiration, clear to auscultation bilaterally HEART: Regular rate and  rhythm; no murmurs rubs or gallops BACK: No kyphosis or scoliosis; no CVA tenderness ABDOMEN: Obese, soft non-tender; no masses, no organomegaly, normal abdominal bowel sounds; no pannus; no intertriginous candida. Rectal Exam: Not done EXTREMITIES: No bone or joint deformity; age-appropriate arthropathy of the hands and knees; no edema; no ulcerations. Genitalia: not examined PULSES: 2+ and symmetric SKIN: Normal hydration she has petechial rash on her lower legs bilaterally. CNS: Cranial nerves 2-12 grossly intact no focal lateralizing neurologic deficit. Her speech is fluent. She has facial symmetry. Uvula elevated with phonation. Tongue is midline. Strength equal bilaterally. Cerebellar exam was carefully done and was negative.   Labs & Imaging Results for orders placed during the hospital encounter of 07/01/11 (from the past 48 hour(s))  GLUCOSE, CAPILLARY     Status: Abnormal   Collection Time   07/01/11  7:43 PM      Component Value Range Comment   Glucose-Capillary 117 (*) 70 - 99 (mg/dL)   CBC     Status: Normal   Collection Time   07/01/11  9:40 PM      Component Value Range Comment   WBC 10.1  4.0 - 10.5 (K/uL)    RBC 4.72  3.87 - 5.11 (MIL/uL)    Hemoglobin 14.2  12.0 - 15.0 (g/dL)    HCT 11.9  14.7 - 82.9 (%)    MCV 89.4  78.0 - 100.0 (fL)    MCH 30.1  26.0 - 34.0 (pg)    MCHC 33.6  30.0 - 36.0 (g/dL)    RDW 56.2  13.0 - 86.5 (%)    Platelets 155  150 - 400 (K/uL)   POCT I-STAT, CHEM 8     Status: Abnormal   Collection Time   07/01/11 11:08 PM      Component Value Range Comment   Sodium 140  135 - 145 (mEq/L)    Potassium 6.6 (*) 3.5 - 5.1 (mEq/L)    Chloride 109  96 - 112 (mEq/L)    BUN 38 (*) 6 - 23 (mg/dL)    Creatinine, Ser 7.84  0.50 - 1.10 (mg/dL)    Glucose, Bld 696 (*) 70 - 99 (mg/dL)    Calcium, Ion 2.95 (*) 1.12 - 1.32 (mmol/L)    TCO2 22  0 - 100 (mmol/L)    Hemoglobin 14.3  12.0 - 15.0 (g/dL)    HCT 28.4  13.2 - 44.0 (%)    Comment NOTIFIED PHYSICIAN       URINALYSIS, ROUTINE W REFLEX MICROSCOPIC     Status: Normal   Collection Time   07/01/11 11:19 PM      Component Value Range Comment   Color, Urine YELLOW  YELLOW     APPearance CLEAR  CLEAR     Specific Gravity, Urine 1.024  1.005 - 1.030     pH 6.0  5.0 - 8.0     Glucose, UA NEGATIVE  NEGATIVE (mg/dL)    Hgb urine dipstick NEGATIVE  NEGATIVE     Bilirubin Urine NEGATIVE  NEGATIVE     Ketones, ur NEGATIVE  NEGATIVE (mg/dL)    Protein, ur NEGATIVE  NEGATIVE (mg/dL)    Urobilinogen,  UA 0.2  0.0 - 1.0 (mg/dL)    Nitrite NEGATIVE  NEGATIVE     Leukocytes, UA NEGATIVE  NEGATIVE  MICROSCOPIC NOT DONE ON URINES WITH NEGATIVE PROTEIN, BLOOD, LEUKOCYTES, NITRITE, OR GLUCOSE <1000 mg/dL.  POTASSIUM     Status: Normal   Collection Time   07/01/11 11:25 PM      Component Value Range Comment   Potassium 4.1  3.5 - 5.1 (mEq/L) DELTA CHECK NOTED   Dg Orthopantogram  07/02/2011  *RADIOLOGY REPORT*  Clinical Data: Left lower gum swelling/pain, status post root canal 10 days ago  [facility]  Comparison: None.  Findings: Suspected small periapical lucency involving the left lower second molar.  IMPRESSION: Suspected small periapical lucency involving the left lower second molar.  Original Report Authenticated By: Charline Bills, M.D.      Assessment Present on Admission:  .Syncope and collapse .Abdominal pain .Nausea & vomiting .Ovarian cyst .Weakness generalized .Diarrhea   PLAN: Ms. Wojtaszek presents with many vague complaints, but from what I able to gather is that she has been ill only for one day. Her main symptom is abdominal pain, diarrhea, nausea, and lightheadedness. Differential therefore would include prior ovarian cyst rupture, C. difficile colitis from antibiotics for her dental work, gastrointestinal infection, viral infection. I would like to admit her to the hospital, placed on monitor, give her IV fluid, and obtained an abdominal pelvic CT with and without contrast.  Will also obtain lipase to exclude pancreatitis. For her syncope, will get serial cardiac markers, and an echo. For diarrhea, will obtain C. difficile PCR. I suspect that with IV fluids and symptomatic treatment, that she would improve. She is a full code, stable, and will be admitted to triad hospitalist service. She is in agreement with this initial plan of care.  Other plans as per orders.    Lania Zawistowski 07/02/2011, 5:25 AM

## 2011-07-02 NOTE — Progress Notes (Signed)
VASCULAR LAB PRELIMINARY  PRELIMINARY  PRELIMINARY  PRELIMINARY  Carotid Dopplers completed.    Preliminary report:  There is no ICA stenosis.  Vertebral artery flow is antegrade. Sherren Kerns [place], 07/02/2011, 4:29 PM

## 2011-07-02 NOTE — ED Provider Notes (Signed)
Medical screening examination/treatment/procedure(s) were conducted as a shared visit with non-physician practitioner(s) and myself.  I personally evaluated the patient during the encounter Pt most likely with side affects from an over the counter medication but no improvement with fluids and still having persistent abd pain and intermittant near syncope with severe pain.  No peritoneal signs.  Pt admitted for further care.  Gwyneth Sprout, MD 07/02/11 2117

## 2011-07-02 NOTE — Progress Notes (Signed)
  Echocardiogram 2D Echocardiogram has been performed.  [name] 07/02/2011, 11:20 AM

## 2011-07-03 DIAGNOSIS — R55 Syncope and collapse: Secondary | ICD-10-CM | POA: Diagnosis not present

## 2011-07-03 DIAGNOSIS — K529 Noninfective gastroenteritis and colitis, unspecified: Secondary | ICD-10-CM | POA: Diagnosis not present

## 2011-07-03 DIAGNOSIS — E876 Hypokalemia: Secondary | ICD-10-CM | POA: Diagnosis not present

## 2011-07-03 LAB — CBC
HCT: 35.3 % — ABNORMAL LOW (ref 36.0–46.0)
MCV: 91 fL (ref 78.0–100.0)
RDW: 13.9 % (ref 11.5–15.5)
WBC: 4.3 10*3/uL (ref 4.0–10.5)

## 2011-07-03 LAB — BASIC METABOLIC PANEL
BUN: 9 mg/dL (ref 6–23)
CO2: 20 mEq/L (ref 19–32)
Chloride: 113 mEq/L — ABNORMAL HIGH (ref 96–112)
Creatinine, Ser: 0.63 mg/dL (ref 0.50–1.10)

## 2011-07-03 LAB — CARDIAC PANEL(CRET KIN+CKTOT+MB+TROPI): Relative Index: INVALID (ref 0.0–2.5)

## 2011-07-03 MED ORDER — POTASSIUM CHLORIDE CRYS ER 20 MEQ PO TBCR
40.0000 meq | EXTENDED_RELEASE_TABLET | Freq: Once | ORAL | Status: DC
  Filled 2011-07-03: qty 2

## 2011-07-03 NOTE — Progress Notes (Signed)
Late entry. Patient had a 2.18 second pause at 2354 07/02/11. Patient asymptomatic. NP on call paged and informed. No orders given. Will continue to assess patient.

## 2011-07-03 NOTE — Discharge Summary (Signed)
Discharge Summary  JULIE NAY MR#: [id number]  DOB:1945-11-03  Date of Admission: 07/01/2011 Date of Discharge: 07/03/2011  Patient's PCP: Londell Moh, MD, MD  Attending Physician:HONGALGI,ANAND  Consults: None  Discharge Diagnoses: Principal Problem:  *Nausea & vomiting Active Problems:  Syncope and collapse  Abdominal pain  Ovarian cyst  Weakness generalized  Diarrhea Hypokalemia Anemia Thrombocytopenia Mildly prominent main pancreatic duct with suspected pancreatic divisum on CT abdomen: Outpatient evaluation as deemed necessary.  Brief Admitting History and Physical 66 y.o. female with history of recently diagnosed ovarian cyst (as per patient), hypertension, esophageal spasm, MS, recent dental procedure presented to the emergency room with abdominal pain, nausea, diarrhea, mild headache and a syncopal episode. She recently completed a course of Ampicillin post dental procedure.    Discharge Medications Current Discharge Medication List    CONTINUE these medications which have NOT CHANGED   Details  amLODipine (NORVASC) 10 MG tablet Take 10 mg by mouth daily.    aspirin EC 81 MG tablet Take 81 mg by mouth daily.    carvedilol (COREG) 6.25 MG tablet Take 6.25 mg by mouth 2 (two) times daily with a meal.    ibuprofen (ADVIL,MOTRIN) 800 MG tablet Take 800 mg by mouth every 8 (eight) hours as needed. For tooth pain      STOP taking these medications     Green Tea, Camillia sinensis, 150 MG CAPS Comments:  Reason for Stopping:          Hospital Course: Nausea & vomiting Present on Admission:  .Syncope and collapse .Abdominal pain .Nausea & vomiting .Ovarian cyst .Weakness generalized .Diarrhea   1. Acute onset of diarrhea and nausea: possibly Acute viral gastroenteritis. CT abd= no acute findings. Lipase normal. C Diff PCR neg. Improved. Less diarrhea and no abd pain. Tolerating PO and mild nausea but no vomiting. Eager to go  home. 2. Syncope:Negative orthostatic BP's. Echo with normal LV function. Carotid duplex; no ICA stenosis. ? Due to dehydration/hypotension prior to admission..   3. Mild CAD: continue Coreg, Amlodipine and ASA. CE's negative. No CP. No acute EKG changes. Echo OK. Says she does not have HTN and takes these meds for CAD. 4. Hypokalemia: replete prior to D/C. 5. Anemia and Thrombocytopenia: ? Etiology. No bleeding. ?Dilutional. F/U CBC in 5-7 days 6. Mildly prominent main pancreatic duct with suspected pancreatic divisum: Seen on CT of the abdomen which is probably an incidental finding. Outpatient evaluation as deemed necessary.   Day of Discharge BP 115/70  Pulse 62  Temp(Src) 98.6 F (37 C) (Oral)  Resp 17  Ht 5\' 3"  (1.6 m)  Wt 69.264 kg (152 lb 11.2 oz)  BMI 27.05 kg/m2  SpO2 97%  General Exam: Comfortable.  Respiratory System: Clear. No increased work of breathing.  Cardiovascular System: First and second heart sounds heard. Regular rate and rhythm. No JVD/murmurs. Telemetry shows mostly sinus rhythm in 60's to Sinus bradycardia in 50's. Single episode of asymptomatic 2.18 sec pause last night and an episode of NSSVT of 160/min yesterday.  Gastrointestinal System: Abdomen is non distended, soft and normal bowel sounds heard. Nontender.  Central Nervous System: Alert and oriented. No focal neurological deficits.  Extremities: 5 x 5 power. No pedal edema.  Basic Metabolic Panel:  Lab 07/03/11 2440 07/01/11 2325 07/01/11 2308  NA 140 -- 140  K 3.2* 4.1 6.6*  CL 113* -- 109  CO2 20 -- --  GLUCOSE 103* -- 128*  BUN 9 -- 38*  CREATININE 0.63 -- 0.80  CALCIUM 8.0* -- --  ALB -- -- --  PHOS -- -- --   Liver Function Tests: No results found for this basename: AST:3,ALT:3,ALKPHOS:3,BILITOT:3,PROT:3,ALBUMIN:3 in the last 168 hours  Lab 07/02/11 0937  LIPASE 35  AMYLASE --   No results found for this basename: AMMONIA:3 in the last 168 hours CBC:  Lab 07/03/11 0452 07/01/11  2308 07/01/11 2140  WBC 4.3 -- 10.1  NEUTROABS -- -- --  HGB 11.7* 14.3 14.2  HCT 35.3* 42.0 42.2  MCV 91.0 -- 89.4  PLT 144* -- 155   Cardiac Enzymes:  Lab 07/02/11 2355 07/02/11 1605 07/02/11 0937  CKTOTAL 37 48 50  CKMB 1.6 1.7 1.8  CKMBINDEX -- -- --  TROPONINI <0.30 <0.30 <0.30   CBG:  Lab 07/01/11 1943  GLUCAP 117*     Dg Orthopantogram  07/02/2011  *RADIOLOGY REPORT*  Clinical Data: Left lower gum swelling/pain, status post root canal 10 days ago  ORTHOPANTOGRAM/PANORAMIC  Comparison: None.  Findings: Suspected small periapical lucency involving the left lower second molar.  IMPRESSION: Suspected small periapical lucency involving the left lower second molar.  Original Report Authenticated By: Charline Bills, M.D.   US Transvaginal Non-ob  06/20/2011  *RADIOLOGY REPORT*  Clinical Data: Pelvic fullness.  TRANSABDOMINAL AND TRANSVAGINAL ULTRASOUND OF PELVIS Technique:  Both transabdominal and transvaginal ultrasound examinations of the pelvis were performed. Transabdominal technique was performed for global imaging of the pelvis including uterus, ovaries, adnexal regions, and pelvic cul-de-sac.  Comparison: None.   It was necessary to proceed with endovaginal exam following the transabdominal exam to visualize the ovaries and adnexal regions.  Findings:  Uterus: Surgically absent.  Endometrium: Surgically absent.  Right ovary:  Measures 2.9 x 2.1 x 3.0 cm, negative.  Left ovary: Measures 2.8 x 1.3 x 1.8 cm.  A 1.2 cm anechoic lesion with increased through transmission is seen within.  Other findings: No free fluid.  IMPRESSION:  1.  No acute findings. 2.  Small left ovarian cyst.  This is almost certainly benign, but follow up ultrasound is recommended in 1 year according to the Society of Radiologists in Ultrasound 2010 Consensus Conference Statement (D Lenis Noon et al.  Management of Asymptomatic Ovarian and Other Adnexal Cysts Imaged at [place]:  Society of Radiologists in Ultrasound  Consensus Conference Statement 2010.  Radiology 256 (Sept 2010):  [phone].).  Original Report Authenticated By: Reyes Ivan, M.D.   US Pelvis Complete  06/20/2011  *RADIOLOGY REPORT*  Clinical Data: Pelvic fullness.  TRANSABDOMINAL AND TRANSVAGINAL ULTRASOUND OF PELVIS Technique:  Both transabdominal and transvaginal ultrasound examinations of the pelvis were performed. Transabdominal technique was performed for global imaging of the pelvis including uterus, ovaries, adnexal regions, and pelvic cul-de-sac.  Comparison: None.   It was necessary to proceed with endovaginal exam following the transabdominal exam to visualize the ovaries and adnexal regions.  Findings:  Uterus: Surgically absent.  Endometrium: Surgically absent.  Right ovary:  Measures 2.9 x 2.1 x 3.0 cm, negative.  Left ovary: Measures 2.8 x 1.3 x 1.8 cm.  A 1.2 cm anechoic lesion with increased through transmission is seen within.  Other findings: No free fluid.  IMPRESSION:  1.  No acute findings. 2.  Small left ovarian cyst.  This is almost certainly benign, but follow up ultrasound is recommended in 1 year according to the Society of Radiologists in Ultrasound 2010 Consensus Conference Statement (D Lenis Noon et al.  Management of Asymptomatic Ovarian and Other Adnexal Cysts Imaged at [place]:  Society of Radiologists  in Ultrasound Consensus Conference Statement 2010.  Radiology 256 (Sept 2010):  [phone].).  Original Report Authenticated By: Reyes Ivan, M.D.   Ct Abdomen Pelvis W Contrast  07/02/2011  *RADIOLOGY REPORT*  Clinical Data: Vague abdominal pain, nausea, diarrhea, prior hysterectomy  CT ABDOMEN AND PELVIS WITH CONTRAST  Technique:  Multidetector CT imaging of the abdomen and pelvis was performed following the standard protocol during bolus administration of intravenous contrast.  Contrast: OMNIPAQUE IOHEXOL 300 MG/ML  SOLN  Comparison: None.  Findings: Mild linear scarring and dependent atelectasis in the right lower lobe.   Two tiny probable hepatic cysts (series 2/image 19).  Spleen and adrenal glands are within normal limits.  Pancreas is notable for a mildly prominent pancreatic duct and suspected pancreatic divisum.  Gallbladder is unremarkable.  No intrahepatic or extrahepatic ductal dilatation.  Small right upper pole cyst (series 2/image 32).  Left kidney is within normal limits.  No hydronephrosis.  No evidence of bowel obstruction.  Normal appendix.  Atherosclerotic calcifications of the abdominal aorta and branch vessels.  No abdominopelvic ascites.  No suspicious abdominopelvic lymphadenopathy.  Status post hysterectomy.  Bilateral ovaries are within normal limits.  Bladder is unremarkable.  Mild degenerative changes of the visualized thoracolumbar spine.  IMPRESSION: Normal appendix.  No evidence of bowel obstruction.  Mildly prominent main pancreatic duct with suspected pancreatic divisum.  Consider nonemergent MRI/MRCP abdomen with/without contrast for confirmation as clinically warranted.  No CT findings to account for the patient's abdominal pain.  Original Report Authenticated By: Charline Bills, M.D.   EKG: SR, normal axis, nonspecific T wave abnormality, no acute changes, QTC: 430 msecs.  Disposition: Discharged home in stable condition.  Diet: Heart Healthy.  Activity: Increase activity gradually.   Follow-up Appts: Discharge Orders    Future Orders Please Complete By Expires   Diet - low sodium heart healthy      Increase activity slowly      Call MD for:  persistant dizziness or light-headedness      Call MD for:  temperature >100.4      Call MD for:  severe uncontrolled pain      Call MD for:  persistant nausea and vomiting         TESTS THAT NEED FOLLOW-UP CBC in 5-7 days.  Time spent on discharge, talking to the patient, and coordinating care: 30 mins.  Patient's care was discussed with her in great detail. CT abdomen findings and recommendations for outpatient followup were also  discussed with her and she verbalized understanding.   SignedMarcellus Scott, MD 07/03/2011, 7:25 PM

## 2011-07-04 NOTE — Progress Notes (Signed)
CARE MANAGEMENT NOTE 07/04/2011  Patient:  [name], [name]   Account Number:  [id number]  Date Initiated:  07/04/2011  Documentation initiated by:  Eulalio Reamy  Subjective/Objective Assessment:   patient with cardiac history had episode of weakness and syncopy     Action/Plan:   lives at home   Anticipated DC Date:  07/03/2011   Anticipated DC Plan:  HOME/SELF CARE         Choice offered to / List presented to:             Status of service:  Completed, signed off Medicare Important Message given?   (If response is "NO", the following Medicare IM given date fields will be blank) Date Medicare IM given:   Date Additional Medicare IM given:    Discharge Disposition:  HOME/SELF CARE  Per UR Regulation:  Reviewed for med. necessity/level of care/duration of stay  If discussed at Long Length of Stay Meetings, dates discussed:    Comments:  [id number]/Scharlene Catalina,Rn,BSN,CCM

## 2011-07-07 DIAGNOSIS — E876 Hypokalemia: Secondary | ICD-10-CM | POA: Diagnosis not present

## 2011-07-07 DIAGNOSIS — A088 Other specified intestinal infections: Secondary | ICD-10-CM | POA: Diagnosis not present

## 2011-07-07 DIAGNOSIS — D649 Anemia, unspecified: Secondary | ICD-10-CM | POA: Diagnosis not present

## 2011-07-07 DIAGNOSIS — E7800 Pure hypercholesterolemia, unspecified: Secondary | ICD-10-CM | POA: Diagnosis not present

## 2011-07-14 DIAGNOSIS — M899 Disorder of bone, unspecified: Secondary | ICD-10-CM | POA: Diagnosis not present

## 2011-07-14 DIAGNOSIS — R1084 Generalized abdominal pain: Secondary | ICD-10-CM | POA: Diagnosis not present

## 2011-07-14 DIAGNOSIS — I10 Essential (primary) hypertension: Secondary | ICD-10-CM | POA: Diagnosis not present

## 2011-07-27 DIAGNOSIS — R1909 Other intra-abdominal and pelvic swelling, mass and lump: Secondary | ICD-10-CM | POA: Diagnosis not present

## 2011-07-27 DIAGNOSIS — N83209 Unspecified ovarian cyst, unspecified side: Secondary | ICD-10-CM | POA: Diagnosis not present

## 2011-08-01 ENCOUNTER — Other Ambulatory Visit (HOSPITAL_COMMUNITY): Payer: Self-pay | Admitting: Obstetrics and Gynecology

## 2011-08-01 DIAGNOSIS — Z1231 Encounter for screening mammogram for malignant neoplasm of breast: Secondary | ICD-10-CM

## 2011-08-15 DIAGNOSIS — R079 Chest pain, unspecified: Secondary | ICD-10-CM | POA: Diagnosis not present

## 2011-08-15 DIAGNOSIS — E782 Mixed hyperlipidemia: Secondary | ICD-10-CM | POA: Diagnosis not present

## 2011-08-16 ENCOUNTER — Ambulatory Visit (HOSPITAL_COMMUNITY): Payer: Medicare Other

## 2011-08-16 DIAGNOSIS — R933 Abnormal findings on diagnostic imaging of other parts of digestive tract: Secondary | ICD-10-CM | POA: Diagnosis not present

## 2011-08-16 DIAGNOSIS — K589 Irritable bowel syndrome without diarrhea: Secondary | ICD-10-CM | POA: Diagnosis not present

## 2011-08-16 DIAGNOSIS — H00019 Hordeolum externum unspecified eye, unspecified eyelid: Secondary | ICD-10-CM | POA: Diagnosis not present

## 2011-08-19 NOTE — ED Provider Notes (Signed)
Medical screening examination/treatment/procedure(s) were performed by non-physician practitioner and as supervising physician I was immediately available for consultation/collaboration.   Herrick Hartog, MD 08/19/11 0406 

## 2011-09-08 ENCOUNTER — Ambulatory Visit (HOSPITAL_COMMUNITY)
Admission: RE | Admit: 2011-09-08 | Discharge: 2011-09-08 | Disposition: A | Discharge status: Home / Self Care | Payer: Medicare Other | Source: Ambulatory Visit | Attending: Obstetrics and Gynecology | Admitting: Obstetrics and Gynecology

## 2011-09-08 DIAGNOSIS — Z1231 Encounter for screening mammogram for malignant neoplasm of breast: Secondary | ICD-10-CM | POA: Insufficient documentation

## 2011-09-15 DIAGNOSIS — M810 Age-related osteoporosis without current pathological fracture: Secondary | ICD-10-CM | POA: Diagnosis not present

## 2011-09-15 DIAGNOSIS — E7800 Pure hypercholesterolemia, unspecified: Secondary | ICD-10-CM | POA: Diagnosis not present

## 2011-10-27 DIAGNOSIS — L820 Inflamed seborrheic keratosis: Secondary | ICD-10-CM | POA: Diagnosis not present

## 2011-10-27 DIAGNOSIS — D239 Other benign neoplasm of skin, unspecified: Secondary | ICD-10-CM | POA: Diagnosis not present

## 2011-11-15 DIAGNOSIS — H10409 Unspecified chronic conjunctivitis, unspecified eye: Secondary | ICD-10-CM | POA: Diagnosis not present

## 2011-11-15 DIAGNOSIS — H18599 Other hereditary corneal dystrophies, unspecified eye: Secondary | ICD-10-CM | POA: Diagnosis not present

## 2011-11-15 DIAGNOSIS — H2510 Age-related nuclear cataract, unspecified eye: Secondary | ICD-10-CM | POA: Diagnosis not present

## 2011-11-15 DIAGNOSIS — H11129 Conjunctival concretions, unspecified eye: Secondary | ICD-10-CM | POA: Diagnosis not present

## 2011-11-16 DIAGNOSIS — G35 Multiple sclerosis: Secondary | ICD-10-CM | POA: Diagnosis not present

## 2011-11-16 DIAGNOSIS — G959 Disease of spinal cord, unspecified: Secondary | ICD-10-CM | POA: Diagnosis not present

## 2011-12-12 DIAGNOSIS — R079 Chest pain, unspecified: Secondary | ICD-10-CM | POA: Diagnosis not present

## 2011-12-12 DIAGNOSIS — E782 Mixed hyperlipidemia: Secondary | ICD-10-CM | POA: Diagnosis not present

## 2011-12-28 DIAGNOSIS — D509 Iron deficiency anemia, unspecified: Secondary | ICD-10-CM | POA: Diagnosis not present

## 2012-01-03 DIAGNOSIS — Z23 Encounter for immunization: Secondary | ICD-10-CM | POA: Diagnosis not present

## 2012-01-24 DIAGNOSIS — L820 Inflamed seborrheic keratosis: Secondary | ICD-10-CM | POA: Diagnosis not present

## 2012-02-13 DIAGNOSIS — N949 Unspecified condition associated with female genital organs and menstrual cycle: Secondary | ICD-10-CM | POA: Diagnosis not present

## 2012-02-13 DIAGNOSIS — N83209 Unspecified ovarian cyst, unspecified side: Secondary | ICD-10-CM | POA: Diagnosis not present

## 2012-05-16 DIAGNOSIS — G35 Multiple sclerosis: Secondary | ICD-10-CM | POA: Diagnosis not present

## 2012-06-08 ENCOUNTER — Other Ambulatory Visit (HOSPITAL_COMMUNITY): Payer: Self-pay | Admitting: Cardiovascular Disease

## 2012-06-08 DIAGNOSIS — R079 Chest pain, unspecified: Secondary | ICD-10-CM

## 2012-06-21 ENCOUNTER — Other Ambulatory Visit: Payer: Self-pay | Admitting: Internal Medicine

## 2012-06-21 DIAGNOSIS — E049 Nontoxic goiter, unspecified: Secondary | ICD-10-CM

## 2012-06-22 ENCOUNTER — Ambulatory Visit (HOSPITAL_COMMUNITY)
Admission: RE | Admit: 2012-06-22 | Discharge: 2012-06-22 | Disposition: A | Discharge status: Home / Self Care | Payer: Medicare FFS | Source: Ambulatory Visit | Attending: Cardiovascular Disease | Admitting: Cardiovascular Disease

## 2012-06-22 DIAGNOSIS — R42 Dizziness and giddiness: Secondary | ICD-10-CM | POA: Insufficient documentation

## 2012-06-22 DIAGNOSIS — I10 Essential (primary) hypertension: Secondary | ICD-10-CM | POA: Insufficient documentation

## 2012-06-22 DIAGNOSIS — I2510 Atherosclerotic heart disease of native coronary artery without angina pectoris: Secondary | ICD-10-CM | POA: Insufficient documentation

## 2012-06-22 DIAGNOSIS — R5383 Other fatigue: Secondary | ICD-10-CM | POA: Insufficient documentation

## 2012-06-22 DIAGNOSIS — R5381 Other malaise: Secondary | ICD-10-CM | POA: Insufficient documentation

## 2012-06-22 DIAGNOSIS — I359 Nonrheumatic aortic valve disorder, unspecified: Secondary | ICD-10-CM | POA: Insufficient documentation

## 2012-06-22 DIAGNOSIS — R0602 Shortness of breath: Secondary | ICD-10-CM | POA: Insufficient documentation

## 2012-06-22 DIAGNOSIS — R002 Palpitations: Secondary | ICD-10-CM | POA: Insufficient documentation

## 2012-06-22 DIAGNOSIS — R079 Chest pain, unspecified: Secondary | ICD-10-CM | POA: Insufficient documentation

## 2012-06-22 DIAGNOSIS — E663 Overweight: Secondary | ICD-10-CM | POA: Insufficient documentation

## 2012-06-22 HISTORY — PX: OTHER SURGICAL HISTORY: SHX169

## 2012-06-22 MED ORDER — TECHNETIUM TC 99M SESTAMIBI GENERIC - CARDIOLITE
30.0000 | Freq: Once | INTRAVENOUS | Status: AC | PRN
  Administered 2012-06-22: 30 via INTRAVENOUS

## 2012-06-22 MED ORDER — TECHNETIUM TC 99M SESTAMIBI GENERIC - CARDIOLITE
10.0000 | Freq: Once | INTRAVENOUS | Status: AC | PRN
  Administered 2012-06-22: 10 via INTRAVENOUS

## 2012-06-22 NOTE — Procedures (Addendum)
[facility] [name] [place] [address] [place] [address] [phone]  Cardiology Nuclear Med Study  [name] is a 67 y.o. female     MRN : [id number]     DOB: 10-26-1945  Procedure Date: 06/22/2012  Nuclear Med Background Indication for Stress Test:  Evaluation for Ischemia and PTCA Patency History:  Asthma and CAD;PTCA-10/04/2006 Cardiac Risk Factors: Family History - CAD, Hypertension, Lipids, Overweight and AORTIC VALVE SCLEROSIS  Symptoms:  Chest Pain, Dizziness and Palpitations   Nuclear Pre-Procedure Caffeine/Decaff Intake:  8:00pm NPO After: 6:00am   IV Site: R Hand  IV 0.9% NS with Angio Cath:  22g  Chest Size (in):  N/A IV Started by: Emmit Pomfret, RN  Height: 5\' 3"  (1.6 m)  Cup Size: C  BMI:  Body mass index is 26.58 kg/(m^2). Weight:  150 lb (68.04 kg)   Tech Comments:  N/A    Nuclear Med Study 1 or 2 day study: 1 day  Stress Test Type:  Stress  Order Authorizing Provider:  Nicki Guadalajara, MD   Resting Radionuclide: Technetium 70m Sestamibi  Resting Radionuclide Dose: 9.9 mCi   Stress Radionuclide:  Technetium 41m Sestamibi  Stress Radionuclide Dose: 30.5 mCi           Stress Protocol Rest HR: 73 Stress HR: 142  Rest BP: 115/67 Stress BP: 190/60  Exercise Time (min): 6:31 METS: 7.80   Predicted Max HR: 155 bpm % Max HR: 91.61 bpm Rate Pressure Product: 21308  Dose of Adenosine (mg):  n/a Dose of Lexiscan: n/a mg  Dose of Atropine (mg): n/a Dose of Dobutamine: n/a mcg/kg/min (at max HR)  Stress Test Technologist: Ernestene Mention, CCT Nuclear Technologist: Koren Shiver, CNMT   Rest Procedure:  Myocardial perfusion imaging was performed at rest 45 minutes following the intravenous administration of Technetium 53m Sestamibi. Stress Procedure:  The patient performed treadmill exercise using a Bruce  Protocol for 6 minutes and 31 seconds. The patient stopped due to fatigue and shortness of breath. Patient  denied any chest pain.  There were no significant ST-T wave changes.  Technetium 62m Sestamibi was injected at peak exercise and myocardial perfusion imaging was performed after a brief delay.  Transient Ischemic Dilatation (Normal <1.22):  0.94 Lung/Heart Ratio (Normal <0.45):  0.32 QGS EDV:  55 ml QGS ESV:  18 ml LV Ejection Fraction: 68%  Signed by     Rest ECG: NSR - Normal EKG with nonspecific T changes  Stress ECG: Nondiagnostic 0.5 - 1.0 mm inferolateral ST changes; Rare PVC  QPS Raw Data Images:  Normal; no motion artifact; normal heart/lung ratio. Stress Images:  Normal homogeneous uptake in all areas of the myocardium. Rest Images:  Normal homogeneous uptake in all areas of the myocardium. Subtraction (SDS):  Normal  Impression Exercise Capacity:  Fair exercise capacity; 7 met workload. BP Response:  Normal blood pressure response. Clinical Symptoms:  No significant symptoms noted. ECG Impression:  Insignificant upsloping ST segment depression. Comparison with Prior Nuclear Study: No significant change from previous study  Overall Impression:  Normal stress nuclear study. Low risk stress nuclear study.  LV Wall Motion:  NL LV Function, EF 68%; NL Wall Motion   KELLY,THOMAS A, MD  06/22/2012 1:43 PM

## 2012-06-25 ENCOUNTER — Other Ambulatory Visit: Payer: Medicare Other

## 2012-06-27 ENCOUNTER — Ambulatory Visit
Admission: RE | Admit: 2012-06-27 | Discharge: 2012-06-27 | Disposition: A | Discharge status: Home / Self Care | Payer: Medicare FFS | Source: Ambulatory Visit | Attending: Internal Medicine | Admitting: Internal Medicine

## 2012-06-27 DIAGNOSIS — E049 Nontoxic goiter, unspecified: Secondary | ICD-10-CM

## 2012-09-04 ENCOUNTER — Other Ambulatory Visit (HOSPITAL_COMMUNITY): Payer: Self-pay | Admitting: Obstetrics and Gynecology

## 2012-09-04 DIAGNOSIS — Z1231 Encounter for screening mammogram for malignant neoplasm of breast: Secondary | ICD-10-CM

## 2012-09-18 ENCOUNTER — Ambulatory Visit (HOSPITAL_COMMUNITY)
Admission: RE | Admit: 2012-09-18 | Discharge: 2012-09-18 | Disposition: A | Discharge status: Home / Self Care | Payer: Medicare FFS | Source: Ambulatory Visit | Attending: Obstetrics and Gynecology | Admitting: Obstetrics and Gynecology

## 2012-09-18 ENCOUNTER — Other Ambulatory Visit (HOSPITAL_COMMUNITY): Payer: Self-pay | Admitting: Obstetrics and Gynecology

## 2012-09-18 DIAGNOSIS — Z1231 Encounter for screening mammogram for malignant neoplasm of breast: Secondary | ICD-10-CM

## 2012-11-14 ENCOUNTER — Ambulatory Visit (HOSPITAL_COMMUNITY)
Admission: RE | Admit: 2012-11-14 | Discharge: 2012-11-14 | Disposition: A | Discharge status: Home / Self Care | Payer: Medicare FFS | Source: Ambulatory Visit | Attending: Obstetrics and Gynecology | Admitting: Obstetrics and Gynecology

## 2012-11-14 DIAGNOSIS — Z1231 Encounter for screening mammogram for malignant neoplasm of breast: Secondary | ICD-10-CM

## 2013-04-04 ENCOUNTER — Other Ambulatory Visit: Payer: Self-pay | Admitting: *Deleted

## 2013-04-04 MED ORDER — CARVEDILOL 6.25 MG PO TABS: 6.2500 mg | ORAL_TABLET | Freq: Two times a day (BID) | ORAL | Status: DC

## 2013-04-04 NOTE — Telephone Encounter (Signed)
Rx was sent to pharmacy electronically. 

## 2013-04-12 ENCOUNTER — Other Ambulatory Visit: Payer: Self-pay | Admitting: *Deleted

## 2013-04-12 MED ORDER — AMLODIPINE BESYLATE 5 MG PO TABS: 5.0000 mg | ORAL_TABLET | Freq: Every day | ORAL | Status: DC

## 2013-04-23 DIAGNOSIS — Z01419 Encounter for gynecological examination (general) (routine) without abnormal findings: Secondary | ICD-10-CM | POA: Diagnosis not present

## 2013-05-20 DIAGNOSIS — G35 Multiple sclerosis: Secondary | ICD-10-CM | POA: Diagnosis not present

## 2013-06-25 DIAGNOSIS — Z23 Encounter for immunization: Secondary | ICD-10-CM | POA: Diagnosis not present

## 2013-06-25 DIAGNOSIS — E7800 Pure hypercholesterolemia, unspecified: Secondary | ICD-10-CM | POA: Diagnosis not present

## 2013-06-25 DIAGNOSIS — M949 Disorder of cartilage, unspecified: Secondary | ICD-10-CM | POA: Diagnosis not present

## 2013-06-25 DIAGNOSIS — Z0000 Encounter for general adult medical examination without abnormal findings: Secondary | ICD-10-CM | POA: Diagnosis not present

## 2013-06-25 DIAGNOSIS — M899 Disorder of bone, unspecified: Secondary | ICD-10-CM | POA: Diagnosis not present

## 2013-06-25 DIAGNOSIS — I10 Essential (primary) hypertension: Secondary | ICD-10-CM | POA: Diagnosis not present

## 2013-07-01 DIAGNOSIS — I2510 Atherosclerotic heart disease of native coronary artery without angina pectoris: Secondary | ICD-10-CM | POA: Diagnosis not present

## 2013-07-01 DIAGNOSIS — E7800 Pure hypercholesterolemia, unspecified: Secondary | ICD-10-CM | POA: Diagnosis not present

## 2013-07-01 DIAGNOSIS — F411 Generalized anxiety disorder: Secondary | ICD-10-CM | POA: Diagnosis not present

## 2013-07-01 DIAGNOSIS — M899 Disorder of bone, unspecified: Secondary | ICD-10-CM | POA: Diagnosis not present

## 2013-07-01 DIAGNOSIS — G35 Multiple sclerosis: Secondary | ICD-10-CM | POA: Diagnosis not present

## 2013-07-01 DIAGNOSIS — M7730 Calcaneal spur, unspecified foot: Secondary | ICD-10-CM | POA: Diagnosis not present

## 2013-07-01 DIAGNOSIS — I10 Essential (primary) hypertension: Secondary | ICD-10-CM | POA: Diagnosis not present

## 2013-07-01 DIAGNOSIS — M949 Disorder of cartilage, unspecified: Secondary | ICD-10-CM | POA: Diagnosis not present

## 2013-07-01 DIAGNOSIS — M25579 Pain in unspecified ankle and joints of unspecified foot: Secondary | ICD-10-CM | POA: Diagnosis not present

## 2013-07-03 DIAGNOSIS — I789 Disease of capillaries, unspecified: Secondary | ICD-10-CM | POA: Diagnosis not present

## 2013-07-03 DIAGNOSIS — L821 Other seborrheic keratosis: Secondary | ICD-10-CM | POA: Diagnosis not present

## 2013-07-03 DIAGNOSIS — D485 Neoplasm of uncertain behavior of skin: Secondary | ICD-10-CM | POA: Diagnosis not present

## 2013-07-03 DIAGNOSIS — D239 Other benign neoplasm of skin, unspecified: Secondary | ICD-10-CM | POA: Diagnosis not present

## 2013-07-03 DIAGNOSIS — L819 Disorder of pigmentation, unspecified: Secondary | ICD-10-CM | POA: Diagnosis not present

## 2013-07-03 DIAGNOSIS — L739 Follicular disorder, unspecified: Secondary | ICD-10-CM | POA: Diagnosis not present

## 2013-07-03 DIAGNOSIS — D1801 Hemangioma of skin and subcutaneous tissue: Secondary | ICD-10-CM | POA: Diagnosis not present

## 2013-07-04 ENCOUNTER — Other Ambulatory Visit: Payer: Self-pay | Admitting: *Deleted

## 2013-07-04 MED ORDER — ISOSORBIDE MONONITRATE ER 30 MG PO TB24: 30.0000 mg | ORAL_TABLET | Freq: Every day | ORAL | Status: DC

## 2013-07-05 ENCOUNTER — Telehealth: Payer: Self-pay | Admitting: *Deleted

## 2013-07-05 NOTE — Telephone Encounter (Signed)
Refill on Isosorbide yesterday #90 w/0 refills.  Patient voiced understanding.  Made an appt early June 2015.

## 2013-07-05 NOTE — Telephone Encounter (Signed)
I called the pt to schedule her appointment for yearly follow up and she asked about her Isosorbide. She wanted to know if she can get refill till her appointment on 6/4 with Dr. Claiborne Billings.  TK

## 2013-07-12 DIAGNOSIS — M19049 Primary osteoarthritis, unspecified hand: Secondary | ICD-10-CM | POA: Diagnosis not present

## 2013-07-12 DIAGNOSIS — M7710 Lateral epicondylitis, unspecified elbow: Secondary | ICD-10-CM | POA: Diagnosis not present

## 2013-07-12 DIAGNOSIS — M75120 Complete rotator cuff tear or rupture of unspecified shoulder, not specified as traumatic: Secondary | ICD-10-CM | POA: Diagnosis not present

## 2013-08-01 ENCOUNTER — Encounter: Payer: Self-pay | Admitting: Sports Medicine

## 2013-08-01 ENCOUNTER — Ambulatory Visit (INDEPENDENT_AMBULATORY_CARE_PROVIDER_SITE_OTHER): Payer: Medicare Other | Admitting: Sports Medicine

## 2013-08-01 VITALS — BP 120/78 | Ht 63.0 in | Wt 148.0 lb

## 2013-08-01 DIAGNOSIS — M7750 Other enthesopathy of unspecified foot: Secondary | ICD-10-CM

## 2013-08-01 DIAGNOSIS — M25571 Pain in right ankle and joints of right foot: Secondary | ICD-10-CM | POA: Insufficient documentation

## 2013-08-01 DIAGNOSIS — Q667 Congenital pes cavus, unspecified foot: Secondary | ICD-10-CM | POA: Diagnosis not present

## 2013-08-01 NOTE — Progress Notes (Signed)
Patient ID: [name], female   DOB: [birth year], 68 y.o.   MRN: [id number] 68 year old female comes in with a complaint of right ankle pain. Onset of pain over the past several months.  Denies any acute injury. Pain is on the lateral aspect anteriorly of her right ankle. Worse with prolonged standing. No radiation into the foot. No radiation of the leg. She does get swelling in that region periodically. She's not taking any medication alleviate this.  She is currently immobilized in a splint of her right upper extremity represent bicep tendinitis.  Pertinent past medical history positive for hypertension, negative for diabetes  Social history: She is a nonsmoker, no alcohol  Review of systems as per history of present illness otherwise all systems are negative.  Examination: BP 120/78  Ht 5\' 3"  (1.6 m)  Wt 148 lb (67.132 kg)  BMI 26.22 kg/m2 Well-developed well-nourished 68 year old female awake alert and oriented in no acute distress Right Ankle: Swelling noted in region of sinus tarsi Range of motion is full in all directions. Strength is 5/5 in all directions. Stable lateral and medial ligaments; squeeze test and kleiger test unremarkable; Talar dome nontender; No pain at base of 5th MT; No tenderness over cuboid; No tenderness over N spot or navicular prominence No tenderness on posterior aspects of lateral and medial malleolus No sign of peroneal tendon subluxations or tenderness to palpation Negative tarsal tunnel tinel's Able to walk 4 steps.  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 Gross fluid noted within the sinus tarsi.

## 2013-08-01 NOTE — Assessment & Plan Note (Signed)
Patient given a body helix compression sleeve for the ankle. Also had sports and soles the scaphoid pads placed to help support the arch. She was given home exercise program which included one leg stance and region bend squats her ankle stability

## 2013-08-02 DIAGNOSIS — M19049 Primary osteoarthritis, unspecified hand: Secondary | ICD-10-CM | POA: Diagnosis not present

## 2013-08-20 DIAGNOSIS — M19049 Primary osteoarthritis, unspecified hand: Secondary | ICD-10-CM | POA: Diagnosis not present

## 2013-08-29 ENCOUNTER — Encounter: Payer: Self-pay | Admitting: Cardiovascular Disease

## 2013-08-29 ENCOUNTER — Ambulatory Visit (INDEPENDENT_AMBULATORY_CARE_PROVIDER_SITE_OTHER): Payer: Medicare Other | Admitting: Cardiovascular Disease

## 2013-08-29 VITALS — BP 120/70 | HR 56 | Ht 63.0 in | Wt 150.4 lb

## 2013-08-29 DIAGNOSIS — G35 Multiple sclerosis: Secondary | ICD-10-CM

## 2013-08-29 DIAGNOSIS — I2510 Atherosclerotic heart disease of native coronary artery without angina pectoris: Secondary | ICD-10-CM

## 2013-08-29 DIAGNOSIS — R079 Chest pain, unspecified: Secondary | ICD-10-CM

## 2013-08-29 MED ORDER — NITROGLYCERIN 0.4 MG SL SUBL: 0.4000 mg | SUBLINGUAL_TABLET | SUBLINGUAL | Status: DC | PRN

## 2013-08-29 MED ORDER — ISOSORBIDE MONONITRATE ER 60 MG PO TB24: 60.0000 mg | ORAL_TABLET | Freq: Every day | ORAL | Status: DC

## 2013-08-29 NOTE — Progress Notes (Signed)
Patient ID: [name], female   DOB: 15-Jul-1945, 69 y.o.   MRN: [id number]     HPI: [name] is a 68 y.o. female who presents to the office today for a 15 month follow up cardiology evaluation.  [name] has documented mild coronary artery disease and had undergone cardiac catheterization in July 2008 by Dr. Roe Rutherford.  She had mild narrowing of 20-30% in the proximal and 10-20% in the mid left circumflex vessel.  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  She has been on medical therapy.  She also has a history of hypertension,.  Her last stress test was done in March 2014 were she had nonspecific T changes and nondiagnostic 0.5-1 mm inferolateral ST segment changes with stress.  Scintigraphic images revealed normal perfusion and function.  She has a history of hyperlipidemia and has been currently taking Vytorin 10/20.  Is also history of GERD for which he takes over-the-counter Prilosec.  Recently, she has noticed 3 episodes of chest pain over the past 4 weeks.  Typically, these would last 2-5 minutes, and would be associated with jaw and teeth discomfort.  She denies awareness of reflux symptoms.  She did have recent blood work done by Dr. Thayer Jew far and she was told that these were normal.  She now presents for evaluation.  Past Medical History  Diagnosis Date  . MS (multiple sclerosis)   . Esophageal spasm     History reviewed. No pertinent past surgical history.  Allergies  Allergen Reactions  . Crestor [Rosuvastatin] Other (See Comments)    UNSPECIFIED   . Vicodin [Hydrocodone-Acetaminophen]     Current Outpatient Prescriptions  Medication Sig Dispense Refill  . amLODipine (NORVASC) 5 MG tablet Take 1 tablet (5 mg total) by mouth daily.  180 tablet  3  . carbamazepine (TEGRETOL) 200 MG tablet Take 200 mg by mouth at bedtime.       . carvedilol (COREG) 6.25 MG tablet Take 1 tablet (6.25 mg total) by mouth 2 (two) times daily with a meal.  60  tablet  4  . gabapentin (NEURONTIN) 300 MG capsule Take 600 mg by mouth 4 (four) times daily.      [name] PRILOSEC OTC 20 MG tablet Take 20 mg by mouth daily.       [name] VYTORIN 10-20 MG per tablet Take 1 tablet by mouth daily.       . isosorbide mononitrate (IMDUR) 60 MG 24 hr tablet Take 1 tablet (60 mg total) by mouth daily.  30 tablet  11  . nitroGLYCERIN (NITROSTAT) 0.4 MG SL tablet Place 1 tablet (0.4 mg total) under the tongue every 5 (five) minutes as needed for chest pain.  25 tablet  3   No current facility-administered medications for this visit.    History   Social History  . Marital Status: Divorced    Spouse Name: N/A    Number of Children: N/A  . Years of Education: N/A   Occupational History  . Not on file.   Social History Main Topics  . Smoking status: Never Smoker   . Smokeless tobacco: Never Used  . Alcohol Use: Not on file  . Drug Use: Not on file  . Sexual Activity: Not on file   Other Topics Concern  . Not on file   Social History Narrative  . No narrative on file    Family History  Problem Relation Age of Onset  . Heart disease  Mother   . Hyperlipidemia Brother     ROS General: Negative; No fevers, chills, or night sweats HEENT: Negative; No changes in vision or hearing, sinus congestion, difficulty swallowing Pulmonary: Negative; No cough, wheezing, shortness of breath, hemoptysis Cardiovascular: See history of present illness; No presyncope, syncope, palpatations GI: Negative; No nausea, vomiting, diarrhea, or abdominal pain GU: Negative; No dysuria, hematuria, or difficulty voiding Musculoskeletal: Negative; no myalgias, joint pain, or weakness Hematologic: Negative; no easy bruising, bleeding Endocrine: Negative; no heat/cold intolerance; no diabetes, Neuro: Positive for multiple sclerosis, currently on no therapy, no changes in balance, headaches Skin: Negative; No rashes or skin lesions Psychiatric: Negative; No behavioral problems,  depression Sleep: Negative; No snoring,  daytime sleepiness, hypersomnolence, bruxism, restless legs, hypnogognic hallucinations. Other comprehensive 14 point system review is negative    Physical Exam BP 120/70  Pulse 56  Ht 5\' 3"  (1.6 m)  Wt 150 lb 6.4 oz (68.221 kg)  BMI 26.65 kg/m2 General: Alert, oriented, no distress.  Skin: normal turgor, no rashes, warm and dry HEENT: Normocephalic, atraumatic. Pupils equal round and reactive to light; sclera anicteric; extraocular muscles intact, No lid lag; Nose without nasal septal hypertrophy; Mouth/Parynx benign; Mallinpatti scale 2 Neck: No JVD, no carotid bruits; normal carotid upstroke Lungs: clear to ausculatation and percussion bilaterally; no wheezing or rales, normal inspiratory and expiratory effort Chest wall: without tenderness to palpitation Heart: PMI not displaced, RRR, s1 s2 normal, 1/6 systolic murmur, No diastolic murmur, no rubs, gallops, thrills, or heaves Abdomen: soft, nontender; no hepatosplenomehaly, BS+; abdominal aorta nontender and not dilated by palpation. Back: no CVA tenderness Pulses: 2+  Musculoskeletal: full range of motion, normal strength, no joint deformities Extremities: Pulses 2+, no clubbing cyanosis or edema, Homan's sign negative  Neurologic: grossly nonfocal; Cranial nerves grossly wnl Psychologic: Normal mood and affect    ECG (independently read by me): Sinus rhythm at 56 beats per minute.  Nonspecific ST changes.  LABS:  BMET    Component Value Date/Time   NA 140 07/03/2011 0452   K 3.2* 07/03/2011 0452   CL 113* 07/03/2011 0452   CO2 20 07/03/2011 0452   GLUCOSE 103* 07/03/2011 0452   BUN 9 07/03/2011 0452   CREATININE 0.63 07/03/2011 0452   CALCIUM 8.0* 07/03/2011 0452   GFRNONAA >90 07/03/2011 0452   GFRAA >90 07/03/2011 0452     Hepatic Function Panel     Component Value Date/Time   PROT 5.9* 05/10/2009 0432   ALBUMIN 3.4* 05/10/2009 0432   AST 19 05/10/2009 0432   ALT 22 05/10/2009 0432    ALKPHOS 70 05/10/2009 0432   BILITOT 0.4 05/10/2009 0432     CBC    Component Value Date/Time   WBC 4.3 07/03/2011 0452   RBC 3.88 07/03/2011 0452   HGB 11.7* 07/03/2011 0452   HCT 35.3* 07/03/2011 0452   PLT 144* 07/03/2011 0452   MCV 91.0 07/03/2011 0452   MCH 30.2 07/03/2011 0452   MCHC 33.1 07/03/2011 0452   RDW 13.9 07/03/2011 0452   LYMPHSABS 2.2 05/10/2009 0432   MONOABS 0.6 05/10/2009 0432   EOSABS 0.1 05/10/2009 0432   BASOSABS 0.0 05/10/2009 0432     BNP No results found for this basename: probnp    Lipid Panel  No results found for this basename: chol, trig, hdl, cholhdl, vldl, ldlcalc     RADIOLOGY: No results found.    ASSESSMENT AND PLAN: Ms. [name] is a 68 year old female, who is now 7 years status post  cardiac catheterization, which revealed mild CAD involving her left circumflex coronary artery in addition to systolic bridging of the mid LAD.  Her last nuclear perfusion study was one year ago and showed normal perfusion.  Presently, electing to increase her isosorbide mononitrate, which should help for potential spasm in etiology of her chest pain.  Her chest pain, typically is nonexertional.  She denies associated diaphoresis.  Will try to obtain the laboratory, which had been recently done by Dr. Shelia Media.  She is not having any ectopy.  She does have GERD symptoms, but her recent chest pain is different.  I will see her in 6 months for followup evaluation.  However, he can return increased symptoms develop she is advised to contact me for sooner evaluation.   Time spent: 25 minutes  Troy Sine, MD, [facility]  08/29/2013 4:28 PM

## 2013-08-29 NOTE — Patient Instructions (Signed)
Dr Claiborne Billings has recommended making the following medication changes:  INCREASE Isosorbide to 60 mg daily. You can take 2 tablets daily until your current bottle is gone.  You physician wants you to follow-up in 6 months. You will receive a reminder letter in the mail one month in advance. If you don't receive a letter, please call our office to schedule the follow-up appointment.

## 2013-09-02 ENCOUNTER — Encounter: Payer: Self-pay | Admitting: Internal Medicine

## 2013-09-12 ENCOUNTER — Ambulatory Visit (INDEPENDENT_AMBULATORY_CARE_PROVIDER_SITE_OTHER): Payer: Medicare Other | Admitting: Sports Medicine

## 2013-09-12 ENCOUNTER — Encounter: Payer: Self-pay | Admitting: Sports Medicine

## 2013-09-12 VITALS — BP 109/69 | Ht 63.0 in | Wt 148.0 lb

## 2013-09-12 DIAGNOSIS — M7750 Other enthesopathy of unspecified foot: Secondary | ICD-10-CM

## 2013-09-12 DIAGNOSIS — I2510 Atherosclerotic heart disease of native coronary artery without angina pectoris: Secondary | ICD-10-CM

## 2013-09-12 DIAGNOSIS — M216X9 Other acquired deformities of unspecified foot: Secondary | ICD-10-CM | POA: Diagnosis not present

## 2013-09-12 DIAGNOSIS — M25571 Pain in right ankle and joints of right foot: Secondary | ICD-10-CM

## 2013-09-12 NOTE — Progress Notes (Signed)
History was provided by the patient.  [name] is a 68 y.o. female who is here for follow up of right ankle weakness and sinus tarsi syndrome   HPI:  1. Her last visit was found to have a history of right ankle issues that seem to relate to sinus tarsi syndrome. She was giving a compression sleeve for her right ankle and a series of exercises. She feels that she is a good bit stronger and has had less ankle pain or problems since last visit  2. Was in a splint for biceps tendonitis, but has ben out of the splint and is doing fairly well. She was given several exercise, but has some questions about them. Followed by Dr Daylene Katayama.  Ankle issues. Has been working on balance. Cannot stand on one foot and reach to touch something. Can do on right leg, but not left leg. She has shoes that have an arch (scaphoid patch), but is not wearing them as much this summer because she is wearing flip flops a lot.    Physical Exam:  BP 109/69  Ht 5\' 3"  (1.6 m)  Wt 148 lb (67.132 kg)  BMI 26.22 kg/m2  No LMP recorded. Patient has had a hysterectomy.  NAD  Left ankle very flexible. Right ankle, slightly reduced range of motion compared to the left. No effusions of ankle.  Swelling has resolved in the right sinus tarsi. All ankle ligaments are stable  4 cm arch with sitting, 2 cm arch with standing. Overall shape of the foot is cavus  1 foot balance is still not as good on the right as the left  She is not able to do a cone touch test but can do a one foot touch test to the wall  Assessment/Plan:  Sinus tarsi syndrome with significant improvement  Use compression sleeve for significant walking or standing Modify the ankle balance exercises but continue these Wear shoes with good arch support Recheck when necessary  [place], Katherine, MD  09/12/2013  Verlee Rossetti, MD

## 2013-09-12 NOTE — Assessment & Plan Note (Signed)
She should continue using sports insoles with scaphoid pads  Return for replacing these when they wear out

## 2013-09-12 NOTE — Patient Instructions (Signed)
Biceps - Biceps curls with light weights (1-2 pounds) 6-8 reps. Do 2-3 sets - Once a week add 2 reps to goal of 15-16 reps.  - Do palm up curls. When at full goal reps, can add some rotation with forearm rolls (with weight in hand, roll out).   Balance - Stand on one leg and reach out to touch the wall. Alternate arms. - Can build up to touching object on the floor  Sinus Tarsi Syndrome - Wear support sleeve when you are going to be standing or walking for a long time or you notice more swelling - continue to wear arch support

## 2013-09-12 NOTE — Assessment & Plan Note (Signed)
She is doing better and we will continue using compression sleeves and home exercises

## 2013-09-17 ENCOUNTER — Other Ambulatory Visit: Payer: Self-pay | Admitting: *Deleted

## 2013-09-17 MED ORDER — CARVEDILOL 6.25 MG PO TABS: 6.2500 mg | ORAL_TABLET | Freq: Two times a day (BID) | ORAL | Status: DC

## 2013-09-17 NOTE — Telephone Encounter (Signed)
Rx was sent to pharmacy electronically. 

## 2013-09-24 DIAGNOSIS — M810 Age-related osteoporosis without current pathological fracture: Secondary | ICD-10-CM | POA: Diagnosis not present

## 2013-10-10 DIAGNOSIS — M1710 Unilateral primary osteoarthritis, unspecified knee: Secondary | ICD-10-CM | POA: Diagnosis not present

## 2013-11-18 DIAGNOSIS — G35 Multiple sclerosis: Secondary | ICD-10-CM | POA: Diagnosis not present

## 2013-11-19 DIAGNOSIS — H18839 Recurrent erosion of cornea, unspecified eye: Secondary | ICD-10-CM | POA: Diagnosis not present

## 2013-11-19 DIAGNOSIS — H43819 Vitreous degeneration, unspecified eye: Secondary | ICD-10-CM | POA: Diagnosis not present

## 2013-11-19 DIAGNOSIS — H1045 Other chronic allergic conjunctivitis: Secondary | ICD-10-CM | POA: Diagnosis not present

## 2013-11-19 DIAGNOSIS — H04129 Dry eye syndrome of unspecified lacrimal gland: Secondary | ICD-10-CM | POA: Diagnosis not present

## 2013-11-19 DIAGNOSIS — H2510 Age-related nuclear cataract, unspecified eye: Secondary | ICD-10-CM | POA: Diagnosis not present

## 2013-12-12 ENCOUNTER — Other Ambulatory Visit (HOSPITAL_COMMUNITY): Payer: Self-pay | Admitting: Obstetrics and Gynecology

## 2013-12-12 DIAGNOSIS — Z1231 Encounter for screening mammogram for malignant neoplasm of breast: Secondary | ICD-10-CM

## 2013-12-20 ENCOUNTER — Ambulatory Visit (HOSPITAL_COMMUNITY)
Admission: RE | Admit: 2013-12-20 | Discharge: 2013-12-20 | Disposition: A | Discharge status: Home / Self Care | Payer: Medicare Other | Source: Ambulatory Visit | Attending: Obstetrics and Gynecology | Admitting: Obstetrics and Gynecology

## 2013-12-20 DIAGNOSIS — Z1231 Encounter for screening mammogram for malignant neoplasm of breast: Secondary | ICD-10-CM | POA: Diagnosis not present

## 2013-12-22 DIAGNOSIS — Z23 Encounter for immunization: Secondary | ICD-10-CM | POA: Diagnosis not present

## 2014-02-19 ENCOUNTER — Encounter: Payer: Self-pay | Admitting: *Deleted

## 2014-02-25 ENCOUNTER — Ambulatory Visit (INDEPENDENT_AMBULATORY_CARE_PROVIDER_SITE_OTHER): Payer: Medicare Other | Admitting: Cardiovascular Disease

## 2014-02-25 ENCOUNTER — Encounter: Payer: Self-pay | Admitting: Cardiovascular Disease

## 2014-02-25 VITALS — BP 120/70 | HR 62 | Ht 63.0 in | Wt 152.4 lb

## 2014-02-25 DIAGNOSIS — I2510 Atherosclerotic heart disease of native coronary artery without angina pectoris: Secondary | ICD-10-CM

## 2014-02-25 DIAGNOSIS — G629 Polyneuropathy, unspecified: Secondary | ICD-10-CM | POA: Diagnosis not present

## 2014-02-25 DIAGNOSIS — E785 Hyperlipidemia, unspecified: Secondary | ICD-10-CM | POA: Diagnosis not present

## 2014-02-25 DIAGNOSIS — K219 Gastro-esophageal reflux disease without esophagitis: Secondary | ICD-10-CM | POA: Insufficient documentation

## 2014-02-25 NOTE — Patient Instructions (Signed)
Your physician wants you to follow-up in: 1 year or sooner if needed with Dr. Claiborne Billings.  No new changes were made today in your therapy. You will receive a reminder letter in the mail two months in advance. If you don't receive a letter, please call our office to schedule the follow-up appointment.

## 2014-02-25 NOTE — Progress Notes (Signed)
Patient ID: [name], female   DOB: 04/12/1945, 68 y.o.   MRN: [id number]     HPI: [name] is a 68 y.o. female who presents to the office today for a 6 month follow up cardiology evaluation.  Ms. Biederman has documented mild coronary artery disease and had undergone cardiac catheterization in July 2008 by Dr. Roe Rutherford.  She had mild narrowing of 20-30% in the proximal and 10-20% in the mid left circumflex vessel.  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  She has been on medical therapy.  She also has a history of hypertension,.  Her last stress test was done in March 2014 were she had nonspecific T changes and nondiagnostic 0.5-1 mm inferolateral ST segment changes with stress.  Scintigraphic images revealed normal perfusion and function.  She has a history of hyperlipidemia and has been currently taking Vytorin 10/20. She  also history of GERD for which he takes over-the-counter Prilosec.  She has noticed several episodes of nonexertional chest discomfort over the past 6 months, which she states at times would radiate to her teeth.  She denies any clear-cut exertional symptomatology.  Typically, these would last 2-5 minutes, and would be associated with jaw and teeth discomfort.  She denies any PND orthopnea.  She is unaware of palpitations.  She tells me Dr. Shelia Media has recently checked blood work and I will try to obtain these results for my review.  She presents for evaluation.  Past Medical History  Diagnosis Date  . MS (multiple sclerosis)   . Esophageal spasm   . Coronary artery disease     Past Surgical History  Procedure Laterality Date  . Transthoracic echocardiogram  07/02/11    LV CAVITY SIZE IS NORMAL. SYSTOLIC FUNCTION WAS NORMAL.EF=55% TO 60%.INCREASED RELATIVE CONTRIBUTION OF ATRIAL CONTRACTION TO VENTRICULAR FILLING;MAYBE DUE TO HYPOVOLEMIA. AV=MILD REGURG.  . Cardiology nuclear med study  06/22/12    NL LV FUNCTION,EF 68%,NL WALL MOTION.  .  Cardiac catheterization  10/04/06    MINOR CAD,SINGLE VESSEL INVOLVING THE CIRCUMFLEX. 20 TO 30% PROXIMALLY AND 10 TO 20% IN THE MIDDLE SEGMENT.MILD MUSCLE BRIDGING, MID LAD.NORMAL RCA.NORMAL LV FUNCTION.NORMAL MITRAL AND AORTIC VALVE.NORMAL APPEARING AORTA,THORACIC AND ABDOMINAL.NORMAL RENAL ARTERIES.  . Carotid duplex  07/02/11    HYI:FOYD SOFT PLAQUE NOTED DISTAL CCA AND ORGIN AND PROXIMAL ICA,LEFT>RIGHT.NO ICA STENOSIS. VERTEBRAL ARTERY FLOW IS ANTEGRADE.    Allergies  Allergen Reactions  . Crestor [Rosuvastatin] Other (See Comments)    UNSPECIFIED   . Vicodin [Hydrocodone-Acetaminophen]     Current Outpatient Prescriptions  Medication Sig Dispense Refill  . amLODipine (NORVASC) 5 MG tablet Take 1 tablet (5 mg total) by mouth daily. 180 tablet 3  . carbamazepine (TEGRETOL) 200 MG tablet Take 200 mg by mouth at bedtime.     . carvedilol (COREG) 6.25 MG tablet Take 1 tablet (6.25 mg total) by mouth 2 (two) times daily with a meal. 60 tablet 11  . gabapentin (NEURONTIN) 300 MG capsule Take 600 mg by mouth 4 (four) times daily.    . isosorbide mononitrate (IMDUR) 60 MG 24 hr tablet Take 1 tablet (60 mg total) by mouth daily. 30 tablet 11  . nitroGLYCERIN (NITROSTAT) 0.4 MG SL tablet Place 1 tablet (0.4 mg total) under the tongue every 5 (five) minutes as needed for chest pain. 25 tablet 3  . PRILOSEC OTC 20 MG tablet Take 20 mg by mouth daily.     [name] VYTORIN 10-20 MG per  tablet Take 1 tablet by mouth daily.      No current facility-administered medications for this visit.    History   Social History  . Marital Status: Divorced    Spouse Name: N/A    Number of Children: N/A  . Years of Education: N/A   Occupational History  . Not on file.   Social History Main Topics  . Smoking status: Never Smoker   . Smokeless tobacco: Never Used  . Alcohol Use: Not on file  . Drug Use: Not on file  . Sexual Activity: Not on file   Other Topics Concern  . Not on file   Social History  Narrative    Family History  Problem Relation Age of Onset  . Heart disease Mother   . Hyperlipidemia Brother     ROS General: Negative; No fevers, chills, or night sweats HEENT: Negative; No changes in vision or hearing, sinus congestion, difficulty swallowing Pulmonary: Negative; No cough, wheezing, shortness of breath, hemoptysis Cardiovascular: See history of present illness; No presyncope, syncope, palpatations GI: Negative; No nausea, vomiting, diarrhea, or abdominal pain GU: Negative; No dysuria, hematuria, or difficulty voiding Musculoskeletal: Negative; no myalgias, joint pain, or weakness Hematologic: Negative; no easy bruising, bleeding Endocrine: Negative; no heat/cold intolerance; no diabetes, Neuro: Positive for multiple sclerosis, currently on no therapy, no changes in balance, headaches Skin: Negative; No rashes or skin lesions Psychiatric: Negative; No behavioral problems, depression Sleep: Negative; No snoring,  daytime sleepiness, hypersomnolence, bruxism, restless legs, hypnogognic hallucinations. Other comprehensive 14 point system review is negative    Physical Exam BP 120/70 mmHg  Pulse 62  Ht 5\' 3"  (1.6 m)  Wt 152 lb 6.4 oz (69.128 kg)  BMI 27.00 kg/m2 General: Alert, oriented, no distress.  Skin: normal turgor, no rashes, warm and dry HEENT: Normocephalic, atraumatic. Pupils equal round and reactive to light; sclera anicteric; extraocular muscles intact, No lid lag; Nose without nasal septal hypertrophy; Mouth/Parynx benign; Mallinpatti scale 2 Neck: No JVD, no carotid bruits; normal carotid upstroke Lungs: clear to ausculatation and percussion bilaterally; no wheezing or rales, normal inspiratory and expiratory effort Chest wall: without tenderness to palpitation Heart: PMI not displaced, RRR, s1 s2 normal, 1/6 systolic murmur, No diastolic murmur, no rubs, gallops, thrills, or heaves Abdomen: soft, nontender; no hepatosplenomehaly, BS+; abdominal  aorta nontender and not dilated by palpation. Back: no CVA tenderness Pulses: 2+  Musculoskeletal: full range of motion, normal strength, no joint deformities Extremities: Pulses 2+, no clubbing cyanosis or edema, Homan's sign negative  Neurologic: grossly nonfocal; Cranial nerves grossly wnl Psychologic: Normal mood and affect  ECG (independently read by me): Normal sinus rhythm at 62 bpm.  Normal intervals.  QTc interval 440 ms.  No significant ST segment changes.  June 2015 ECG (independently read by me): Sinus rhythm at 56 beats per minute.  Nonspecific ST changes.  LABS:  BMET    Component Value Date/Time   NA 140 07/03/2011 0452   K 3.2* 07/03/2011 0452   CL 113* 07/03/2011 0452   CO2 20 07/03/2011 0452   GLUCOSE 103* 07/03/2011 0452   BUN 9 07/03/2011 0452   CREATININE 0.63 07/03/2011 0452   CALCIUM 8.0* 07/03/2011 0452   GFRNONAA >90 07/03/2011 0452   GFRAA >90 07/03/2011 0452     Hepatic Function Panel     Component Value Date/Time   PROT 5.9* 05/10/2009 0432   ALBUMIN 3.4* 05/10/2009 0432   AST 19 05/10/2009 0432   ALT 22 05/10/2009 0432  ALKPHOS 70 05/10/2009 0432   BILITOT 0.4 05/10/2009 0432     CBC    Component Value Date/Time   WBC 4.3 07/03/2011 0452   RBC 3.88 07/03/2011 0452   HGB 11.7* 07/03/2011 0452   HCT 35.3* 07/03/2011 0452   PLT 144* 07/03/2011 0452   MCV 91.0 07/03/2011 0452   MCH 30.2 07/03/2011 0452   MCHC 33.1 07/03/2011 0452   RDW 13.9 07/03/2011 0452   LYMPHSABS 2.2 05/10/2009 0432   MONOABS 0.6 05/10/2009 0432   EOSABS 0.1 05/10/2009 0432   BASOSABS 0.0 05/10/2009 0432     BNP No results found for: PROBNP  Lipid Panel  No results found for: CHOL   RADIOLOGY: No results found.    ASSESSMENT AND PLAN: Ms. Karter Haire is a 68 year-old female with previously documented mild CAD involving her left circumflex coronary artery in addition to systolic bridging of the mid LAD noted at cardiac catheterization in February  2008.[name]  Her last nuclear perfusion study in March 2014 showed normal perfusion.  I last saw her, I increased her isosorbide.  She has noticed to symptoms of chest pain over the past 6 months.  Her blood pressure today is controlled on amlodipine 5 mg, isosorbide 60 mg in addition to carvedilol 6.25 mg twice a day.  Her heart rhythm is stable.  She is tolerating Vytorin 10/20 for hyperlipidemia with target LDL less than 70.  She does take over-the-counter Prilosec for GERD symptoms and is stable.  She has a history of peripheral neuropathy for which he takes gabapentin and is tolerating this well without side effects.  I will try to obtain the results of the blood work by Dr. Shelia Media.  As long as she remains stable, I will see her in one year for cardiology reevaluation.  Time spent: 25 minutes  Troy Sine, MD, [facility]  02/25/2014 10:12 AM

## 2014-02-26 DIAGNOSIS — L820 Inflamed seborrheic keratosis: Secondary | ICD-10-CM | POA: Diagnosis not present

## 2014-04-30 DIAGNOSIS — L919 Hypertrophic disorder of the skin, unspecified: Secondary | ICD-10-CM | POA: Diagnosis not present

## 2014-04-30 DIAGNOSIS — L239 Allergic contact dermatitis, unspecified cause: Secondary | ICD-10-CM | POA: Diagnosis not present

## 2014-05-01 ENCOUNTER — Other Ambulatory Visit: Payer: Self-pay | Admitting: Obstetrics and Gynecology

## 2014-05-01 DIAGNOSIS — Z124 Encounter for screening for malignant neoplasm of cervix: Secondary | ICD-10-CM | POA: Diagnosis not present

## 2014-05-01 DIAGNOSIS — Z6827 Body mass index (BMI) 27.0-27.9, adult: Secondary | ICD-10-CM | POA: Diagnosis not present

## 2014-05-02 LAB — CYTOLOGY - PAP

## 2014-05-05 DIAGNOSIS — H1131 Conjunctival hemorrhage, right eye: Secondary | ICD-10-CM | POA: Diagnosis not present

## 2014-05-05 DIAGNOSIS — H2513 Age-related nuclear cataract, bilateral: Secondary | ICD-10-CM | POA: Diagnosis not present

## 2014-05-05 DIAGNOSIS — H04123 Dry eye syndrome of bilateral lacrimal glands: Secondary | ICD-10-CM | POA: Diagnosis not present

## 2014-05-06 ENCOUNTER — Other Ambulatory Visit: Payer: Self-pay | Admitting: Cardiovascular Disease

## 2014-05-06 NOTE — Telephone Encounter (Signed)
Rx(s) sent to pharmacy electronically.  

## 2014-07-11 DIAGNOSIS — M7062 Trochanteric bursitis, left hip: Secondary | ICD-10-CM | POA: Diagnosis not present

## 2014-07-17 DIAGNOSIS — Z7982 Long term (current) use of aspirin: Secondary | ICD-10-CM | POA: Diagnosis not present

## 2014-07-17 DIAGNOSIS — Z0000 Encounter for general adult medical examination without abnormal findings: Secondary | ICD-10-CM | POA: Diagnosis not present

## 2014-07-17 DIAGNOSIS — I10 Essential (primary) hypertension: Secondary | ICD-10-CM | POA: Diagnosis not present

## 2014-07-17 DIAGNOSIS — E780 Pure hypercholesterolemia: Secondary | ICD-10-CM | POA: Diagnosis not present

## 2014-07-22 DIAGNOSIS — F419 Anxiety disorder, unspecified: Secondary | ICD-10-CM | POA: Diagnosis not present

## 2014-07-22 DIAGNOSIS — E780 Pure hypercholesterolemia: Secondary | ICD-10-CM | POA: Diagnosis not present

## 2014-07-22 DIAGNOSIS — K589 Irritable bowel syndrome without diarrhea: Secondary | ICD-10-CM | POA: Diagnosis not present

## 2014-07-22 DIAGNOSIS — I2510 Atherosclerotic heart disease of native coronary artery without angina pectoris: Secondary | ICD-10-CM | POA: Diagnosis not present

## 2014-08-20 DIAGNOSIS — K580 Irritable bowel syndrome with diarrhea: Secondary | ICD-10-CM | POA: Diagnosis not present

## 2014-08-20 DIAGNOSIS — F411 Generalized anxiety disorder: Secondary | ICD-10-CM | POA: Diagnosis not present

## 2014-08-21 DIAGNOSIS — L84 Corns and callosities: Secondary | ICD-10-CM | POA: Diagnosis not present

## 2014-08-21 DIAGNOSIS — L819 Disorder of pigmentation, unspecified: Secondary | ICD-10-CM | POA: Diagnosis not present

## 2014-08-21 DIAGNOSIS — L298 Other pruritus: Secondary | ICD-10-CM | POA: Diagnosis not present

## 2014-08-21 DIAGNOSIS — D225 Melanocytic nevi of trunk: Secondary | ICD-10-CM | POA: Diagnosis not present

## 2014-08-21 DIAGNOSIS — Z79899 Other long term (current) drug therapy: Secondary | ICD-10-CM | POA: Diagnosis not present

## 2014-08-21 DIAGNOSIS — L918 Other hypertrophic disorders of the skin: Secondary | ICD-10-CM | POA: Diagnosis not present

## 2014-08-21 DIAGNOSIS — L501 Idiopathic urticaria: Secondary | ICD-10-CM | POA: Diagnosis not present

## 2014-08-21 DIAGNOSIS — D2262 Melanocytic nevi of left upper limb, including shoulder: Secondary | ICD-10-CM | POA: Diagnosis not present

## 2014-08-21 DIAGNOSIS — L814 Other melanin hyperpigmentation: Secondary | ICD-10-CM | POA: Diagnosis not present

## 2014-08-21 DIAGNOSIS — D1801 Hemangioma of skin and subcutaneous tissue: Secondary | ICD-10-CM | POA: Diagnosis not present

## 2014-08-21 DIAGNOSIS — L821 Other seborrheic keratosis: Secondary | ICD-10-CM | POA: Diagnosis not present

## 2014-08-26 DIAGNOSIS — M7062 Trochanteric bursitis, left hip: Secondary | ICD-10-CM | POA: Diagnosis not present

## 2014-09-11 ENCOUNTER — Other Ambulatory Visit: Payer: Self-pay | Admitting: Cardiovascular Disease

## 2014-09-11 NOTE — Telephone Encounter (Signed)
Rx(s) sent to pharmacy electronically.  

## 2014-09-13 DIAGNOSIS — M25552 Pain in left hip: Secondary | ICD-10-CM | POA: Diagnosis not present

## 2014-09-23 DIAGNOSIS — M7062 Trochanteric bursitis, left hip: Secondary | ICD-10-CM | POA: Diagnosis not present

## 2014-09-24 DIAGNOSIS — R197 Diarrhea, unspecified: Secondary | ICD-10-CM | POA: Diagnosis not present

## 2014-10-01 DIAGNOSIS — Z1212 Encounter for screening for malignant neoplasm of rectum: Secondary | ICD-10-CM | POA: Diagnosis not present

## 2014-10-02 DIAGNOSIS — M7062 Trochanteric bursitis, left hip: Secondary | ICD-10-CM | POA: Diagnosis not present

## 2014-10-03 DIAGNOSIS — M7062 Trochanteric bursitis, left hip: Secondary | ICD-10-CM | POA: Diagnosis not present

## 2014-10-07 DIAGNOSIS — M7062 Trochanteric bursitis, left hip: Secondary | ICD-10-CM | POA: Diagnosis not present

## 2014-10-09 DIAGNOSIS — M7062 Trochanteric bursitis, left hip: Secondary | ICD-10-CM | POA: Diagnosis not present

## 2014-10-14 DIAGNOSIS — M7062 Trochanteric bursitis, left hip: Secondary | ICD-10-CM | POA: Diagnosis not present

## 2014-10-16 DIAGNOSIS — M7062 Trochanteric bursitis, left hip: Secondary | ICD-10-CM | POA: Diagnosis not present

## 2014-10-28 DIAGNOSIS — M7062 Trochanteric bursitis, left hip: Secondary | ICD-10-CM | POA: Diagnosis not present

## 2014-10-30 DIAGNOSIS — M7062 Trochanteric bursitis, left hip: Secondary | ICD-10-CM | POA: Diagnosis not present

## 2014-11-03 DIAGNOSIS — M7062 Trochanteric bursitis, left hip: Secondary | ICD-10-CM | POA: Diagnosis not present

## 2014-11-05 ENCOUNTER — Other Ambulatory Visit: Payer: Self-pay | Admitting: Cardiovascular Disease

## 2014-11-05 DIAGNOSIS — M7062 Trochanteric bursitis, left hip: Secondary | ICD-10-CM | POA: Diagnosis not present

## 2014-11-05 NOTE — Telephone Encounter (Signed)
Rx(s) sent to pharmacy electronically.  

## 2014-11-10 DIAGNOSIS — M7062 Trochanteric bursitis, left hip: Secondary | ICD-10-CM | POA: Diagnosis not present

## 2014-11-24 DIAGNOSIS — M7062 Trochanteric bursitis, left hip: Secondary | ICD-10-CM | POA: Diagnosis not present

## 2014-11-25 ENCOUNTER — Encounter (HOSPITAL_COMMUNITY): Payer: Self-pay | Admitting: *Deleted

## 2014-11-25 ENCOUNTER — Other Ambulatory Visit (HOSPITAL_COMMUNITY): Payer: Self-pay | Admitting: Obstetrics and Gynecology

## 2014-11-25 DIAGNOSIS — H18833 Recurrent erosion of cornea, bilateral: Secondary | ICD-10-CM | POA: Diagnosis not present

## 2014-11-25 DIAGNOSIS — H16223 Keratoconjunctivitis sicca, not specified as Sjogren's, bilateral: Secondary | ICD-10-CM | POA: Diagnosis not present

## 2014-11-25 DIAGNOSIS — H10503 Unspecified blepharoconjunctivitis, bilateral: Secondary | ICD-10-CM | POA: Diagnosis not present

## 2014-11-25 DIAGNOSIS — Z1231 Encounter for screening mammogram for malignant neoplasm of breast: Secondary | ICD-10-CM

## 2014-11-26 DIAGNOSIS — H18831 Recurrent erosion of cornea, right eye: Secondary | ICD-10-CM | POA: Diagnosis not present

## 2014-11-27 DIAGNOSIS — M7062 Trochanteric bursitis, left hip: Secondary | ICD-10-CM | POA: Diagnosis not present

## 2014-12-10 DIAGNOSIS — K219 Gastro-esophageal reflux disease without esophagitis: Secondary | ICD-10-CM | POA: Diagnosis not present

## 2014-12-10 DIAGNOSIS — Z23 Encounter for immunization: Secondary | ICD-10-CM | POA: Diagnosis not present

## 2014-12-10 DIAGNOSIS — R110 Nausea: Secondary | ICD-10-CM | POA: Diagnosis not present

## 2014-12-10 NOTE — Anesthesia Preprocedure Evaluation (Addendum)
Anesthesia Evaluation  Patient identified by MRN, date of birth, ID band Patient awake    Reviewed: Allergy & Precautions, NPO status , Patient's Chart, lab work & pertinent test results  History of Anesthesia Complications (+) history of anesthetic complications (hx of vagal response requiring chest compressions)  Airway Mallampati: II  TM Distance: >3 FB Neck ROM: Full    Dental no notable dental hx. (+) Dental Advisory Given   Pulmonary neg pulmonary ROS,    Pulmonary exam normal breath sounds clear to auscultation       Cardiovascular + CAD  Normal cardiovascular exam Rhythm:Regular Rate:Normal  Transthoracic echocardiogram  07/02/11   LV CAVITY SIZE IS NORMAL. SYSTOLIC FUNCTION WAS NORMAL.EF=55% TO 60%.INCREASED RELATIVE CONTRIBUTION OF ATRIAL CONTRACTION TO VENTRICULAR FILLING;MAYBE DUE TO HYPOVOLEMIA. AV=MILD REGURG. .Cardiology nuclear med study  06/22/12 NL LV FUNCTION,EF 68%,NL WALL MOTION. .Cardiac catheterization  10/04/06 MINOR CAD,SINGLE VESSEL INVOLVING THE CIRCUMFLEX. 20 TO 30% PROXIMALLY AND 10 TO 20% IN THE MIDDLE SEGMENT.MILD MUSCLE BRIDGING, MID LAD.NORMAL RCA.NORMAL LV FUNCTION.NORMAL MITRAL AND AORTIC VALVE.NORMAL APPEARING AORTA,THORACIC AND ABDOMINAL.NORMAL RENAL ARTERIES.   Neuro/Psych Multiple sclerosis  Neuromuscular disease negative neurological ROS  negative psych ROS   GI/Hepatic Neg liver ROS, GERD  ,  Endo/Other  negative endocrine ROS  Renal/GU negative Renal ROS  negative genitourinary   Musculoskeletal  (+) Arthritis ,   Abdominal   Peds negative pediatric ROS (+)  Hematology negative hematology ROS (+)   Anesthesia Other Findings   Reproductive/Obstetrics negative OB ROS                           Anesthesia Physical Anesthesia Plan  ASA: III  Anesthesia Plan: MAC   Post-op Pain Management:    Induction: Intravenous  Airway Management  Planned: Nasal Cannula  Additional Equipment:   Intra-op Plan:   Post-operative Plan:   Informed Consent: I have reviewed the patients History and Physical, chart, labs and discussed the procedure including the risks, benefits and alternatives for the proposed anesthesia with the patient or authorized representative who has indicated his/her understanding and acceptance.   Dental advisory given  Plan Discussed with:   Anesthesia Plan Comments:        Anesthesia Quick Evaluation

## 2014-12-11 ENCOUNTER — Ambulatory Visit (HOSPITAL_COMMUNITY)
Admission: RE | Admit: 2014-12-11 | Discharge: 2014-12-11 | Disposition: A | Discharge status: Home / Self Care | Payer: Medicare Other | Source: Ambulatory Visit | Attending: Gastroenterology | Admitting: Gastroenterology

## 2014-12-11 ENCOUNTER — Ambulatory Visit (HOSPITAL_COMMUNITY): Payer: Medicare Other | Admitting: Anesthesiology

## 2014-12-11 ENCOUNTER — Encounter (HOSPITAL_COMMUNITY)
Admission: RE | Disposition: A | Discharge status: Home / Self Care | Payer: Self-pay | Source: Ambulatory Visit | Attending: Gastroenterology

## 2014-12-11 ENCOUNTER — Encounter (HOSPITAL_COMMUNITY): Payer: Self-pay | Admitting: *Deleted

## 2014-12-11 DIAGNOSIS — G709 Myoneural disorder, unspecified: Secondary | ICD-10-CM | POA: Diagnosis not present

## 2014-12-11 DIAGNOSIS — M1990 Unspecified osteoarthritis, unspecified site: Secondary | ICD-10-CM | POA: Diagnosis not present

## 2014-12-11 DIAGNOSIS — D123 Benign neoplasm of transverse colon: Secondary | ICD-10-CM | POA: Diagnosis not present

## 2014-12-11 DIAGNOSIS — I2510 Atherosclerotic heart disease of native coronary artery without angina pectoris: Secondary | ICD-10-CM | POA: Diagnosis not present

## 2014-12-11 DIAGNOSIS — G35 Multiple sclerosis: Secondary | ICD-10-CM | POA: Diagnosis not present

## 2014-12-11 DIAGNOSIS — Z86010 Personal history of colonic polyps: Secondary | ICD-10-CM | POA: Diagnosis not present

## 2014-12-11 DIAGNOSIS — K219 Gastro-esophageal reflux disease without esophagitis: Secondary | ICD-10-CM | POA: Diagnosis not present

## 2014-12-11 DIAGNOSIS — R197 Diarrhea, unspecified: Secondary | ICD-10-CM | POA: Insufficient documentation

## 2014-12-11 DIAGNOSIS — D122 Benign neoplasm of ascending colon: Secondary | ICD-10-CM | POA: Insufficient documentation

## 2014-12-11 DIAGNOSIS — Z79899 Other long term (current) drug therapy: Secondary | ICD-10-CM | POA: Insufficient documentation

## 2014-12-11 DIAGNOSIS — K635 Polyp of colon: Secondary | ICD-10-CM | POA: Diagnosis not present

## 2014-12-11 HISTORY — DX: Gastro-esophageal reflux disease without esophagitis: K21.9

## 2014-12-11 HISTORY — DX: Dry eye syndrome of bilateral lacrimal glands: H04.123

## 2014-12-11 HISTORY — DX: Other complications of anesthesia, initial encounter: T88.59XA

## 2014-12-11 HISTORY — DX: Anemia, unspecified: D64.9

## 2014-12-11 HISTORY — DX: Angina pectoris, unspecified: I20.9

## 2014-12-11 HISTORY — DX: Unspecified osteoarthritis, unspecified site: M19.90

## 2014-12-11 HISTORY — PX: COLONOSCOPY WITH PROPOFOL: SHX5780

## 2014-12-11 HISTORY — DX: Adverse effect of unspecified anesthetic, initial encounter: T41.45XA

## 2014-12-11 SURGERY — COLONOSCOPY WITH PROPOFOL
Anesthesia: Monitor Anesthesia Care

## 2014-12-11 MED ORDER — PROPOFOL 10 MG/ML IV BOLUS
INTRAVENOUS | Status: AC
  Filled 2014-12-11: qty 20

## 2014-12-11 MED ORDER — LIDOCAINE HCL (CARDIAC) 20 MG/ML IV SOLN
INTRAVENOUS | Status: DC | PRN
  Administered 2014-12-11: 50 mg via INTRAVENOUS

## 2014-12-11 MED ORDER — PROPOFOL 10 MG/ML IV BOLUS
INTRAVENOUS | Status: DC | PRN
  Administered 2014-12-11: 10 mg via INTRAVENOUS
  Administered 2014-12-11: 40 mg via INTRAVENOUS
  Administered 2014-12-11 (×2): 20 mg via INTRAVENOUS
  Administered 2014-12-11: 40 mg via INTRAVENOUS
  Administered 2014-12-11: 20 mg via INTRAVENOUS
  Administered 2014-12-11: 10 mg via INTRAVENOUS
  Administered 2014-12-11 (×3): 30 mg via INTRAVENOUS
  Administered 2014-12-11 (×3): 20 mg via INTRAVENOUS
  Administered 2014-12-11: 30 mg via INTRAVENOUS
  Administered 2014-12-11: 40 mg via INTRAVENOUS

## 2014-12-11 MED ORDER — SODIUM CHLORIDE 0.9 % IV SOLN: INTRAVENOUS | Status: DC

## 2014-12-11 MED ORDER — LIDOCAINE HCL (CARDIAC) 20 MG/ML IV SOLN
INTRAVENOUS | Status: AC
  Filled 2014-12-11: qty 5

## 2014-12-11 MED ORDER — LACTATED RINGERS IV SOLN
INTRAVENOUS | Status: DC
  Administered 2014-12-11: 1000 mL via INTRAVENOUS

## 2014-12-11 MED ORDER — ESMOLOL HCL 10 MG/ML IV SOLN
INTRAVENOUS | Status: DC | PRN
  Administered 2014-12-11: 10 mg via INTRAVENOUS

## 2014-12-11 SURGICAL SUPPLY — 21 items

## 2014-12-11 NOTE — Transfer of Care (Signed)
Immediate Anesthesia Transfer of Care Note  Patient: [name]  Procedure(s) Performed: Procedure(s): COLONOSCOPY WITH PROPOFOL (N/A)  Patient Location: Endo Recovery  Anesthesia Type:MAC  Level of Consciousness: Patient easily awoken, sedated, comfortable, cooperative, following commands, responds to stimulation.   Airway & Oxygen Therapy: Patient spontaneously breathing, ventilating well, oxygen via simple oxygen mask.  Post-op Assessment: Report given to PACU RN, vital signs reviewed and stable, moving all extremities.   Post vital signs: Reviewed and stable.  Complications: No apparent anesthesia complications

## 2014-12-11 NOTE — H&P (Signed)
[name] is an 69 y.o. female.   Chief Complaint: Need for colon cancer screening HPI: The patient is 10 years status post a negative screening colonoscopy, at which time significant sigmoid fixation was noted. She does not have worrisome lower tract symptoms nor any family history of colon cancer. She presents now for updated screening.  Past Medical History  Diagnosis Date  . MS (multiple sclerosis)     stable-sees [name] every 6 months  . Esophageal spasm   . Coronary artery disease   . Complication of anesthesia     s/p Hysterectomy "vagal response "heart stopped" -did not require shocking.  . Anginal pain     being evaluated by Dr. Tyrone [name], arm pain,"bad indigestion" -no heart related findings as of yet  . GERD (gastroesophageal reflux disease)   . Arthritis     hip. back pain  . Anemia     past history-many yrs ago  . Dry eyes     Past Surgical History  Procedure Laterality Date  . Transthoracic echocardiogram  07/02/11    LV CAVITY SIZE IS NORMAL. SYSTOLIC FUNCTION WAS NORMAL.EF=55% TO 60%.INCREASED RELATIVE CONTRIBUTION OF ATRIAL CONTRACTION TO VENTRICULAR FILLING;MAYBE DUE TO HYPOVOLEMIA. AV=MILD REGURG.  . Cardiology nuclear med study  06/22/12    NL LV FUNCTION,EF 68%,NL WALL MOTION.  . Cardiac catheterization  10/04/06    MINOR CAD,SINGLE VESSEL INVOLVING THE CIRCUMFLEX. 20 TO 30% PROXIMALLY AND 10 TO 20% IN THE MIDDLE SEGMENT.MILD MUSCLE BRIDGING, MID LAD.NORMAL RCA.NORMAL LV FUNCTION.NORMAL MITRAL AND AORTIC VALVE.NORMAL APPEARING AORTA,THORACIC AND ABDOMINAL.NORMAL RENAL ARTERIES.  . Carotid duplex  07/02/11    TKZ:SWFU SOFT PLAQUE NOTED DISTAL CCA AND ORGIN AND PROXIMAL ICA,LEFT>RIGHT.NO ICA STENOSIS. VERTEBRAL ARTERY FLOW IS ANTEGRADE.  [name] Parathyroidectomy      partial-many years ago  . Abdominal hysterectomy    . Cesarean section      x2   . Knee arthroscopy Left     scope  . Thumb surgery Bilateral     built up and bone removal  . Tonsillectomy    .  Blepharoplasty Bilateral     Family History  Problem Relation Age of Onset  . Heart disease Mother   . Hyperlipidemia Brother    Social History:  reports that she has never smoked. She has never used smokeless tobacco. She reports that she drinks alcohol. She reports that she does not use illicit drugs.  Allergies:  Allergies  Allergen Reactions  . Crestor [Rosuvastatin] Other (See Comments)    UNSPECIFIED   . Vicodin [Hydrocodone-Acetaminophen]     Medications Prior to Admission  Medication Sig Dispense Refill  . amLODipine (NORVASC) 5 MG tablet Take 1 tablet (5 mg total) by mouth daily. 30 tablet 10  . carvedilol (COREG) 6.25 MG tablet TAKE 1 TABLET TWICE DAILY WITH FOOD. 60 tablet 6  . gabapentin (NEURONTIN) 300 MG capsule Take 600 mg by mouth 4 (four) times daily.     . isosorbide mononitrate (IMDUR) 60 MG 24 hr tablet TAKE 1 TABLET ONCE DAILY. 30 tablet 6  . nitroGLYCERIN (NITROSTAT) 0.4 MG SL tablet Place 1 tablet (0.4 mg total) under the tongue every 5 (five) minutes as needed for chest pain. 25 tablet 3  . PARoxetine (PAXIL) 10 MG tablet Take 10 mg by mouth every morning.    [name] PRILOSEC OTC 20 MG tablet Take 20 mg by mouth daily.     [name] VYTORIN 10-20 MG per tablet Take 1 tablet by mouth daily.     [name]  carbamazepine (TEGRETOL) 200 MG tablet Take 200 mg by mouth at bedtime.       No results found for this or any previous visit (from the past 48 hour(s)). No results found.  ROS nausea after breakfast  Blood pressure 132/75, pulse 76, temperature 97.9 F (36.6 C), temperature source Oral, resp. rate 14, height 5\' 3"  (1.6 m), weight 67.586 kg (149 lb), SpO2 100 %. Physical Exam healthy-appearing pleasant female, no overt neurologic deficits despite her history of multiple sclerosis. Anicteric, no pallor. Chest clear, heart without murmur or arrhythmia. Abdomen without guarding, mass effect or tenderness. Oropharynx benign.  Assessment/Plan 1. Colonoscopy today, using  ultraslim colonoscope in view of prior history of sigmoid fixation 10 years ago, which may have progressed in severity in the meantime 2. Office follow-up with me as desired, for evaluation and treatment of her nausea. The patient posed the question of doing an upper endoscopy today concurrently with her colonoscopy, but I felt that without insurance certification and without a clear clinical understanding what her nausea symptoms are, it would be better to do office evaluation first, and determine whether an endoscopy is truly needed, in which case it could be arranged at a subsequent time.  [name] V 12/11/2014, 11:03 AM

## 2014-12-11 NOTE — Discharge Instructions (Signed)
°Colon Polyps °Polyps are lumps of extra tissue growing inside the body. Polyps can grow in the large intestine (colon). Most colon polyps are noncancerous (benign). However, some colon polyps can become cancerous over time. Polyps that are larger than a pea may be harmful. To be safe, caregivers remove and test all polyps. °CAUSES  °Polyps form when mutations in the genes cause your cells to grow and divide even though no more tissue is needed. °RISK FACTORS °There are a number of risk factors that can increase your chances of getting colon polyps. They include: °· Being older than 50 years. °· Family history of colon polyps or colon cancer. °· Long-term colon diseases, such as colitis or Crohn disease. °· Being overweight. °· Smoking. °· Being inactive. °· Drinking too much alcohol. °SYMPTOMS  °Most small polyps do not cause symptoms. If symptoms are present, they may include: °· Blood in the stool. The stool may look dark red or black. °· Constipation or diarrhea that lasts longer than 1 week. °DIAGNOSIS °People often do not know they have polyps until their caregiver finds them during a regular checkup. Your caregiver can use 4 tests to check for polyps: °· Digital rectal exam. The caregiver wears gloves and feels inside the rectum. This test would find polyps only in the rectum. °· Barium enema. The caregiver puts a liquid called barium into your rectum before taking X-rays of your colon. Barium makes your colon look white. Polyps are dark, so they are easy to see in the X-ray pictures. °· Sigmoidoscopy. A thin, flexible tube (sigmoidoscope) is placed into your rectum. The sigmoidoscope has a light and tiny camera in it. The caregiver uses the sigmoidoscope to look at the last third of your colon. °· Colonoscopy. This test is like sigmoidoscopy, but the caregiver looks at the entire colon. This is the most common method for finding and removing polyps. °TREATMENT  °Any polyps will be removed during a  sigmoidoscopy or colonoscopy. The polyps are then tested for cancer. °PREVENTION  °To help lower your risk of getting more colon polyps: °· Eat plenty of fruits and vegetables. Avoid eating fatty foods. °· Do not smoke. °· Avoid drinking alcohol. °· Exercise every day. °· Lose weight if recommended by your caregiver. °· Eat plenty of calcium and folate. Foods that are rich in calcium include milk, cheese, and broccoli. Foods that are rich in folate include chickpeas, kidney beans, and spinach. °HOME CARE INSTRUCTIONS °Keep all follow-up appointments as directed by your caregiver. You may need periodic exams to check for polyps. °SEEK MEDICAL CARE IF: °You notice bleeding during a bowel movement. °Document Released: 12/09/2003 Document Revised: 06/06/2011 Document Reviewed: 05/24/2011 °ExitCare® Patient Information ©2015 [employer]. This information is not intended to replace advice given to you by your health care provider. Make sure you discuss any questions you have with your health care provider. °Colonoscopy, Care After °These instructions give you information on caring for yourself after your procedure. Your doctor may also give you more specific instructions. Call your doctor if you have any problems or questions after your procedure. °HOME CARE °· Do not drive for 24 hours. °· Do not sign important papers or use machinery for 24 hours. °· You may shower. °· You may go back to your usual activities, but go slower for the first 24 hours. °· Take rest breaks often during the first 24 hours. °· Walk around or use warm packs on your belly (abdomen) if you have belly cramping or gas. °·   Drink enough fluids to keep your pee (urine) clear or pale yellow. °· Resume your normal diet. Avoid heavy or fried foods. °· Avoid drinking alcohol for 24 hours or as told by your doctor. °· Only take medicines as told by your doctor. °If a tissue sample (biopsy) was taken during the procedure:  °· Do not take aspirin or blood  thinners for 7 days, or as told by your doctor. °· Do not drink alcohol for 7 days, or as told by your doctor. °· Eat soft foods for the first 24 hours. °GET HELP IF: °You still have a small amount of blood in your poop (stool) 2-3 days after the procedure. °GET HELP RIGHT AWAY IF: °· You have more than a small amount of blood in your poop. °· You see clumps of tissue (blood clots) in your poop. °· Your belly is puffy (swollen). °· You feel sick to your stomach (nauseous) or throw up (vomit). °· You have a fever. °· You have belly pain that gets worse and medicine does not help. °MAKE SURE YOU: °· Understand these instructions. °· Will watch your condition. °· Will get help right away if you are not doing well or get worse. °Document Released: 04/16/2010 Document Revised: 03/19/2013 Document Reviewed: 11/19/2012 °ExitCare® Patient Information ©2015 [employer]. This information is not intended to replace advice given to you by your health care provider. Make sure you discuss any questions you have with your health care provider. ° °

## 2014-12-11 NOTE — Op Note (Signed)
[facility] [place], [address]   COLONOSCOPY PROCEDURE REPORT  PATIENT: Robyn, [name]  MR#: [id number] BIRTHDATE: 1945-08-22 , 72  yrs. old GENDER: female ENDOSCOPIST: Ronald Lobo, MD REFERRED BY:  Dr. [name] PROCEDURE DATE:  01/09/15 PROCEDURE:   colonoscopy with biopsy ASA CLASS:   II INDICATIONS:  10 year screening for colon cancer (standard risk, last colonoscopy February 2006); also, diarrhea following meals MEDICATIONS: MAC--propofol per anesthesia  DESCRIPTION OF PROCEDURE:   After the risks and benefits and of the procedure were explained, informed consent was obtained. The patient came as an outpatient to the [facility] long endoscopy unit.  We did her exam at the hospital because of prior history of significant fixation of the sigmoid region noted 10 years ago, with concerns that the severity of the fixation may have progressed in the interim, and I wanted availability of the ultraslim scope.  Dgital exam was unremarkable.         The [id number] ([id number]) Pentax ultraslim video colonoscope was introduced through the anus and advanced to the terminal ileum      .  The quality of the prep was  excellent   .  The instrument was then slowly withdrawn as the colon was fully examined. Estimated blood loss is zero unless otherwise noted in this procedure report.   In the descending colon and region of the hepatic flexure, I encountered 3 small (roughly 3 mm) sessile polyps, removed by cold biopsy technique. It appears that excision was complete by this method.  I did encounter moderately severe fixation of the sigmoid region, although the ultraslim colonoscope was able to negotiate this area fairly easily. We then encountered some mild looping, necessitating some external abdominal compression to enter the base of the cecum.  Interestingly, I did not see any diverticular disease anywhere on this exam, in particular, in the  sigmoid region.  Random mucosal biopsies were obtained along the length of the colon to check for microscopic colitis in view of the history of recurring diarrhea in this middle-aged female.  No large polyps, cancer, colitis, or vascular ectasia were noted. Retroflexion in the rectum and reinspection of the rectum were unremarkable.          The scope was then withdrawn from the patient and the procedure completed.  WITHDRAWAL TIME: per nurse's notes  COMPLICATIONS: There were no immediate complications.  ENDOSCOPIC IMPRESSION: 1. Multiple diminutive polyps identified and removed on the screening colonoscopy 2. No source of patient's postprandial diarrhea endoscopically evident   RECOMMENDATIONS: await pathology on biopsies  REPEAT EXAM: to be arranged, according the path results  cc:  _______________________________ eSignedRonald Lobo, MD 01-09-15 11:53 AM   CPT CODES: ICD CODES:  The ICD and CPT codes recommended by this software are interpretations from the data that the clinical staff has captured with the software.  The verification of the translation of this report to the ICD and CPT codes and modifiers is the sole responsibility of the health care institution and practicing physician where this report was generated.  [place]. will not be held responsible for the validity of the ICD and CPT codes included on this report.  AMA assumes no liability for data contained or not contained herein. CPT is a Designer, television/film set of the [employer].   PATIENT NAME:  Robyn, [name] MR#: [id number]

## 2014-12-11 NOTE — Anesthesia Postprocedure Evaluation (Signed)
  Anesthesia Post-op Note  Patient: [name]  Procedure(s) Performed: Procedure(s) (LRB): COLONOSCOPY WITH PROPOFOL (N/A)  Patient Location: PACU  Anesthesia Type: MAC  Level of Consciousness: awake and alert   Airway and Oxygen Therapy: Patient Spontanous Breathing  Post-op Pain: mild  Post-op Assessment: Post-op Vital signs reviewed, Patient's Cardiovascular Status Stable, Respiratory Function Stable, Patent Airway and No signs of Nausea or vomiting  Last Vitals:  Filed Vitals:   12/11/14 1210  BP: 104/52  Pulse: 69  Temp:   Resp: 16    Post-op Vital Signs: stable   Complications: No apparent anesthesia complications

## 2014-12-12 ENCOUNTER — Encounter (HOSPITAL_COMMUNITY): Payer: Self-pay | Admitting: Gastroenterology

## 2014-12-22 ENCOUNTER — Ambulatory Visit (HOSPITAL_COMMUNITY)
Admission: RE | Admit: 2014-12-22 | Discharge: 2014-12-22 | Disposition: A | Discharge status: Home / Self Care | Payer: Medicare Other | Source: Ambulatory Visit | Attending: Obstetrics and Gynecology | Admitting: Obstetrics and Gynecology

## 2014-12-22 DIAGNOSIS — Z1231 Encounter for screening mammogram for malignant neoplasm of breast: Secondary | ICD-10-CM | POA: Insufficient documentation

## 2014-12-29 DIAGNOSIS — G35 Multiple sclerosis: Secondary | ICD-10-CM | POA: Diagnosis not present

## 2015-01-28 DIAGNOSIS — R05 Cough: Secondary | ICD-10-CM | POA: Diagnosis not present

## 2015-01-28 DIAGNOSIS — J40 Bronchitis, not specified as acute or chronic: Secondary | ICD-10-CM | POA: Diagnosis not present

## 2015-02-16 DIAGNOSIS — M9902 Segmental and somatic dysfunction of thoracic region: Secondary | ICD-10-CM | POA: Diagnosis not present

## 2015-02-16 DIAGNOSIS — M5134 Other intervertebral disc degeneration, thoracic region: Secondary | ICD-10-CM | POA: Diagnosis not present

## 2015-02-16 DIAGNOSIS — M546 Pain in thoracic spine: Secondary | ICD-10-CM | POA: Diagnosis not present

## 2015-02-17 DIAGNOSIS — M546 Pain in thoracic spine: Secondary | ICD-10-CM | POA: Diagnosis not present

## 2015-02-17 DIAGNOSIS — M9902 Segmental and somatic dysfunction of thoracic region: Secondary | ICD-10-CM | POA: Diagnosis not present

## 2015-02-17 DIAGNOSIS — M5135 Other intervertebral disc degeneration, thoracolumbar region: Secondary | ICD-10-CM | POA: Diagnosis not present

## 2015-02-18 DIAGNOSIS — M5134 Other intervertebral disc degeneration, thoracic region: Secondary | ICD-10-CM | POA: Diagnosis not present

## 2015-02-18 DIAGNOSIS — M9902 Segmental and somatic dysfunction of thoracic region: Secondary | ICD-10-CM | POA: Diagnosis not present

## 2015-02-18 DIAGNOSIS — M546 Pain in thoracic spine: Secondary | ICD-10-CM | POA: Diagnosis not present

## 2015-02-26 ENCOUNTER — Ambulatory Visit (INDEPENDENT_AMBULATORY_CARE_PROVIDER_SITE_OTHER): Payer: Medicare Other | Admitting: Cardiovascular Disease

## 2015-02-26 ENCOUNTER — Encounter: Payer: Self-pay | Admitting: Cardiovascular Disease

## 2015-02-26 VITALS — BP 128/80 | HR 58 | Ht 63.0 in | Wt 151.0 lb

## 2015-02-26 DIAGNOSIS — R0789 Other chest pain: Secondary | ICD-10-CM | POA: Diagnosis not present

## 2015-02-26 DIAGNOSIS — I2510 Atherosclerotic heart disease of native coronary artery without angina pectoris: Secondary | ICD-10-CM

## 2015-02-26 DIAGNOSIS — G35 Multiple sclerosis: Secondary | ICD-10-CM

## 2015-02-26 DIAGNOSIS — E785 Hyperlipidemia, unspecified: Secondary | ICD-10-CM

## 2015-02-26 NOTE — Progress Notes (Signed)
Patient ID: [name], female   DOB: 10/29/1945, 69 y.o.   MRN: [id number]     HPI: [name] is a 69 y.o. female who presents to the office today for a 12 month follow up cardiology evaluation.  [name] has documented mild CAD and  Underwent cardiac catheterization in July 2008 by Dr. Roe Rutherford.  She had mild narrowing of 20-30% in the proximal and 10-20% in the mid left circumflex vessel.  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  She has been on medical therapy.  She also has a history of hypertension,.  Her last stress test was done in March 2014 were she had nonspecific T changes and nondiagnostic 0.5-1 mm inferolateral ST segment changes with stress.  Scintigraphic images revealed normal perfusion and function.  She has a history of multiple sclerosis and is followed by Dr. Delphia Grates in [place].  She has a history of hyperlipidemia for which she takes Vytorin 10/20 and GERD for which he takes over-the-counter Prilosec.   She has experienced recent episodes of chest pain which have been occurring almost weekly. She experiences squeezing in her arms and jaw.  She denies chest pressure.  The symptoms are not associated with activity and typically resolve on her own.  She has taken nitroglycerin with questionable benefit.  She also notes calf discomfort at night while sleeping.  She denies restless legs.   She has been on a medical regimen consisting of amlodipine 5 mg, carvedilol 6.25 g twice a day, isosorbide 60 mg daily.  . She states that she takes Tegretol for her multiple sclerosis and she also takes Neurontin.  She presents for evaluation.  Past Medical History  Diagnosis Date  . MS (multiple sclerosis) ([place])     stable-sees [name] every 6 months  . Esophageal spasm   . Coronary artery disease   . Complication of anesthesia     s/p Hysterectomy "vagal response "heart stopped" -did not require shocking.  . Anginal pain ([place])     being  evaluated by Dr. Tyrone Sage, arm pain,"bad indigestion" -no heart related findings as of yet  . GERD (gastroesophageal reflux disease)   . Arthritis     hip. back pain  . Anemia     past history-many yrs ago  . Dry eyes     Past Surgical History  Procedure Laterality Date  . Transthoracic echocardiogram  07/02/11    LV CAVITY SIZE IS NORMAL. SYSTOLIC FUNCTION WAS NORMAL.EF=55% TO 60%.INCREASED RELATIVE CONTRIBUTION OF ATRIAL CONTRACTION TO VENTRICULAR FILLING;MAYBE DUE TO HYPOVOLEMIA. AV=MILD REGURG.  . Cardiology nuclear med study  06/22/12    NL LV FUNCTION,EF 68%,NL WALL MOTION.  . Cardiac catheterization  10/04/06    MINOR CAD,SINGLE VESSEL INVOLVING THE CIRCUMFLEX. 20 TO 30% PROXIMALLY AND 10 TO 20% IN THE MIDDLE SEGMENT.MILD MUSCLE BRIDGING, MID LAD.NORMAL RCA.NORMAL LV FUNCTION.NORMAL MITRAL AND AORTIC VALVE.NORMAL APPEARING AORTA,THORACIC AND ABDOMINAL.NORMAL RENAL ARTERIES.  . Carotid duplex  07/02/11    BTD:HRCB SOFT PLAQUE NOTED DISTAL CCA AND ORGIN AND PROXIMAL ICA,LEFT>RIGHT.NO ICA STENOSIS. VERTEBRAL ARTERY FLOW IS ANTEGRADE.  [name] Parathyroidectomy      partial-many years ago  . Abdominal hysterectomy    . Cesarean section      x2   . Knee arthroscopy Left     scope  . Thumb surgery Bilateral     built up and bone removal  . Tonsillectomy    . Blepharoplasty Bilateral   . Colonoscopy with propofol  N/A 12/11/2014    Procedure: COLONOSCOPY WITH PROPOFOL;  Surgeon: Ronald Lobo, MD;  Location: [facility];  Service: Endoscopy;  Laterality: N/A;    Allergies  Allergen Reactions  . Crestor [Rosuvastatin] Other (See Comments)    UNSPECIFIED   . Vicodin [Hydrocodone-Acetaminophen]     Current Outpatient Prescriptions  Medication Sig Dispense Refill  . amLODipine (NORVASC) 5 MG tablet Take 1 tablet (5 mg total) by mouth daily. 30 tablet 10  . carbamazepine (TEGRETOL) 200 MG tablet Take 200 mg by mouth at bedtime as needed.     . carvedilol (COREG) 6.25 MG tablet TAKE 1  TABLET TWICE DAILY WITH FOOD. 60 tablet 6  . gabapentin (NEURONTIN) 300 MG capsule Take 600 mg by mouth 4 (four) times daily as needed.     . isosorbide mononitrate (IMDUR) 60 MG 24 hr tablet TAKE 1 TABLET ONCE DAILY. 30 tablet 6  . nitroGLYCERIN (NITROSTAT) 0.4 MG SL tablet Place 1 tablet (0.4 mg total) under the tongue every 5 (five) minutes as needed for chest pain. 25 tablet 3  . PARoxetine (PAXIL) 10 MG tablet Take 5 mg by mouth every morning.     [name] PRILOSEC OTC 20 MG tablet Take 20 mg by mouth daily.     [name] VYTORIN 10-20 MG per tablet Take 1 tablet by mouth daily.      No current facility-administered medications for this visit.    Social History   Social History  . Marital Status: Divorced    Spouse Name: N/A  . Number of Children: N/A  . Years of Education: N/A   Occupational History  . Not on file.   Social History Main Topics  . Smoking status: Never Smoker   . Smokeless tobacco: Never Used  . Alcohol Use: Yes     Comment: wine occ.  . Drug Use: No  . Sexual Activity: Not on file   Other Topics Concern  . Not on file   Social History Narrative    Family History  Problem Relation Age of Onset  . Heart disease Mother   . Hyperlipidemia Brother     ROS General: Negative; No fevers, chills, or night sweats HEENT: Negative; No changes in vision or hearing, sinus congestion, difficulty swallowing Pulmonary: Negative; No cough, wheezing, shortness of breath, hemoptysis Cardiovascular: See history of present illness; No presyncope, syncope, palpatations GI: Negative; No nausea, vomiting, diarrhea, or abdominal pain GU: Negative; No dysuria, hematuria, or difficulty voiding Musculoskeletal: Negative; no myalgias, joint pain, or weakness Hematologic: Negative; no easy bruising, bleeding Endocrine: Negative; no heat/cold intolerance; no diabetes, Neuro: Positive for multiple sclerosis, currently on no therapy, no changes in balance, headaches Skin: Negative; No  rashes or skin lesions Psychiatric: Negative; No behavioral problems, depression Sleep: Negative; No snoring,  daytime sleepiness, hypersomnolence, bruxism, restless legs, hypnogognic hallucinations. Other comprehensive 14 point system review is negative    Physical Exam BP 128/80 mmHg  Pulse 58  Ht 5' 3" (1.6 m)  Wt 151 lb (68.493 kg)  BMI 26.76 kg/m2   Wt Readings from Last 3 Encounters:  02/26/15 151 lb (68.493 kg)  12/11/14 149 lb (67.586 kg)  02/25/14 152 lb 6.4 oz (69.128 kg)   General: Alert, oriented, no distress.  Skin: normal turgor, no rashes, warm and dry HEENT: Normocephalic, atraumatic. Pupils equal round and reactive to light; sclera anicteric; extraocular muscles intact, No lid lag; Nose without nasal septal hypertrophy; Mouth/Parynx benign; Mallinpatti scale 2 Neck: No JVD, no carotid bruits; normal carotid upstroke  Lungs: clear to ausculatation and percussion bilaterally; no wheezing or rales, normal inspiratory and expiratory effort Chest wall: without tenderness to palpitation Heart: PMI not displaced, RRR, s1 s2 normal, 1/6 systolic murmur, No diastolic murmur, no rubs, gallops, thrills, or heaves Abdomen: soft, nontender; no hepatosplenomehaly, BS+; abdominal aorta nontender and not dilated by palpation. Back: no CVA tenderness Pulses: 2+  Musculoskeletal: full range of motion, normal strength, no joint deformities Extremities: Pulses 2+, no clubbing cyanosis or edema, Homan's sign negative  Neurologic: grossly nonfocal; Cranial nerves grossly wnl Psychologic: Normal mood and affect  ECG (independently read by me):  Sinus bradycardia 58 bpm.  Nonspecific T changes.  ECG (independently read by me): Normal sinus rhythm at 62 bpm.  Normal intervals.  QTc interval 440 ms.  No significant ST segment changes.  June 2015 ECG (independently read by me): Sinus rhythm at 56 beats per minute.  Nonspecific ST changes.  LABS:  BMP Latest Ref Rng 07/03/2011 07/01/2011  07/01/2011  Glucose 70 - 99 mg/dL 103(H) - 128(H)  BUN 6 - 23 mg/dL 9 - 38(H)  Creatinine 0.50 - 1.10 mg/dL 0.63 - 0.80  Sodium 135 - 145 mEq/L 140 - 140  Potassium 3.5 - 5.1 mEq/L 3.2(L) 4.1 6.6(HH)  Chloride 96 - 112 mEq/L 113(H) - 109  CO2 19 - 32 mEq/L 20 - -  Calcium 8.4 - 10.5 mg/dL 8.0(L) - -   Hepatic Function Latest Ref Rng 05/10/2009  Total Protein 6.0 - 8.3 g/dL 5.9(L)  Albumin 3.5 - 5.2 g/dL 3.4(L)  AST 0 - 37 U/L 19  ALT 0 - 35 U/L 22  Alk Phosphatase 39 - 117 U/L 70  Total Bilirubin 0.3 - 1.2 mg/dL 0.4   CBC Latest Ref Rng 07/03/2011 07/01/2011 07/01/2011  WBC 4.0 - 10.5 K/uL 4.3 - 10.1  Hemoglobin 12.0 - 15.0 g/dL 11.7(L) 14.3 14.2  Hematocrit 36.0 - 46.0 % 35.3(L) 42.0 42.2  Platelets 150 - 400 K/uL 144(L) - 155   Lab Results  Component Value Date   MCV 91.0 07/03/2011   MCV 89.4 07/01/2011   MCV 92.6 05/11/2009    Lab Results  Component Value Date   TSH 0.735 07/02/2011   No results found for: HGBA1C   Lipid Panel  No results found for: CHOL, TRIG, HDL, CHOLHDL, VLDL, LDLCALC, LDLDIRECT   RADIOLOGY: No results found.    ASSESSMENT AND PLAN: Ms. Gisel Vipond is a 69 year-old female with previously documented mild CAD involving her left circumflex coronary artery in addition to systolic bridging of the mid LAD noted at cardiac catheterization in February 2008.[name]  Her last nuclear perfusion study in March 2014 showed normal perfusion.   Presently, she states she is experiencing more episodes of weekly chest pain which initially starts as a squeezing in her arm and jaw and then potentially moved to her chest.  It does have atypical features in that it is consistently not occurring with activity and typically resolves on its own. She does not believe this is indigestion. With her recurrent symptomatology, I am recommending a follow-up exercise nuclear study for further evaluation. She has noticed indigestion when she inadvertently misses taking her Prilosec and the  indigestion symptoms are different. Her blood pressure today is controlled on her current medical regimen.  With her amlodipine and nitrate therapy.  She also is on medication for potential spasm of her coronary arteries and/or esophagus. We will contact her regarding her nuclear study, and if abnormal, I will see her back for  immediate follow-up.  Otherwise, I will see her in 6 months for reevaluation.  Time spent: 25 minutes  Troy Sine, MD, [facility]  02/26/2015 8:42 PM

## 2015-02-26 NOTE — Patient Instructions (Signed)
Your physician has requested that you have en exercise stress myoview. For further information please visit [email or web address]. Please follow instruction sheet, as given.  Your physician wants you to follow-up in: 6 months or sooner if needed. You will receive a reminder letter in the mail two months in advance. If you don't receive a letter, please call our office to schedule the follow-up appointment.  If you need a refill on your cardiac medications before your next appointment, please call your pharmacy.

## 2015-03-03 ENCOUNTER — Telehealth (HOSPITAL_COMMUNITY): Payer: Self-pay

## 2015-03-03 NOTE — Telephone Encounter (Signed)
Encounter complete. 

## 2015-03-05 ENCOUNTER — Ambulatory Visit (HOSPITAL_COMMUNITY)
Admission: RE | Admit: 2015-03-05 | Discharge: 2015-03-05 | Disposition: A | Discharge status: Home / Self Care | Payer: Medicare Other | Source: Ambulatory Visit | Attending: Cardiovascular Disease | Admitting: Cardiovascular Disease

## 2015-03-05 DIAGNOSIS — R0609 Other forms of dyspnea: Secondary | ICD-10-CM | POA: Diagnosis not present

## 2015-03-05 DIAGNOSIS — R0789 Other chest pain: Secondary | ICD-10-CM | POA: Diagnosis not present

## 2015-03-05 DIAGNOSIS — I10 Essential (primary) hypertension: Secondary | ICD-10-CM | POA: Diagnosis not present

## 2015-03-05 DIAGNOSIS — R5383 Other fatigue: Secondary | ICD-10-CM | POA: Diagnosis not present

## 2015-03-05 DIAGNOSIS — R002 Palpitations: Secondary | ICD-10-CM | POA: Insufficient documentation

## 2015-03-05 DIAGNOSIS — I779 Disorder of arteries and arterioles, unspecified: Secondary | ICD-10-CM | POA: Insufficient documentation

## 2015-03-05 DIAGNOSIS — Z8249 Family history of ischemic heart disease and other diseases of the circulatory system: Secondary | ICD-10-CM | POA: Diagnosis not present

## 2015-03-05 DIAGNOSIS — R42 Dizziness and giddiness: Secondary | ICD-10-CM | POA: Insufficient documentation

## 2015-03-05 DIAGNOSIS — I2510 Atherosclerotic heart disease of native coronary artery without angina pectoris: Secondary | ICD-10-CM

## 2015-03-05 LAB — MYOCARDIAL PERFUSION IMAGING
CHL CUP NUCLEAR SDS: 0
CHL CUP RESTING HR STRESS: 68 {beats}/min
CHL RATE OF PERCEIVED EXERTION: 16
CSEPEDS: 1 s
CSEPEW: 7 METS
CSEPHR: 97 %
Exercise duration (min): 6 min
LV dias vol: 68 mL
LV sys vol: 23 mL
MPHR: 152 {beats}/min
Peak HR: 148 {beats}/min
SRS: 0
SSS: 0
TID: 0.92

## 2015-03-05 MED ORDER — TECHNETIUM TC 99M SESTAMIBI GENERIC - CARDIOLITE
31.2000 | Freq: Once | INTRAVENOUS | Status: AC | PRN
  Administered 2015-03-05: 31.2 via INTRAVENOUS

## 2015-03-05 MED ORDER — TECHNETIUM TC 99M SESTAMIBI GENERIC - CARDIOLITE
10.7000 | Freq: Once | INTRAVENOUS | Status: AC | PRN
  Administered 2015-03-05: 10.7 via INTRAVENOUS

## 2015-03-10 ENCOUNTER — Telehealth: Payer: Self-pay | Admitting: *Deleted

## 2015-03-10 NOTE — Telephone Encounter (Signed)
Called patient and notified her of nuc results.

## 2015-03-10 NOTE — Telephone Encounter (Signed)
-----   Message from Troy Sine, MD sent at 03/09/2015  5:43 PM EST ----- Nl nuc

## 2015-04-07 ENCOUNTER — Telehealth: Payer: Self-pay | Admitting: Cardiovascular Disease

## 2015-04-07 NOTE — Telephone Encounter (Signed)
Per Answering Service from yesterday-Just received today-Pt wants to discuss her medicine. Please call her.

## 2015-04-07 NOTE — Telephone Encounter (Signed)
Patient is due to reorder medications Feels that with all her test being normal she should not have to continue her heart medications Advised her that she needs to schedule an OV to discuss stopping her medication. Routing to [place] to see if she can fit her in for an OV

## 2015-04-08 NOTE — Telephone Encounter (Signed)
Patient needs to make appointment to discuss. First available. Meantime continue medications as prescribed.

## 2015-04-08 NOTE — Telephone Encounter (Signed)
Routing to scheduling to have them call patient to schedule an appointment to talk about coming off of medications.  Mariann Laster said first available

## 2015-04-15 DIAGNOSIS — I10 Essential (primary) hypertension: Secondary | ICD-10-CM | POA: Diagnosis not present

## 2015-04-16 ENCOUNTER — Ambulatory Visit: Payer: Medicare Other | Admitting: Pharmacist Clinician (PhC)/ Clinical Pharmacy Specialist

## 2015-04-16 ENCOUNTER — Ambulatory Visit (INDEPENDENT_AMBULATORY_CARE_PROVIDER_SITE_OTHER): Payer: Medicare Other | Admitting: Physician Assistant

## 2015-04-16 VITALS — BP 134/70 | HR 64 | Ht 63.0 in | Wt 153.2 lb

## 2015-04-16 DIAGNOSIS — I10 Essential (primary) hypertension: Secondary | ICD-10-CM

## 2015-04-16 DIAGNOSIS — I2510 Atherosclerotic heart disease of native coronary artery without angina pectoris: Secondary | ICD-10-CM

## 2015-04-16 MED ORDER — ASPIRIN EC 81 MG PO TBEC: 81.0000 mg | DELAYED_RELEASE_TABLET | Freq: Every day | ORAL | Status: DC

## 2015-04-16 NOTE — Progress Notes (Signed)
Cardiology Office Note   Date:  04/16/2015   ID:  [name] December 20, 1945, MRN [id number]  PCP:  Horatio Pel, MD  Cardiologist:  Dr Meredeth Ide, PA-C   Chief Complaint  Patient presents with  . Follow-up    discuss medication, pt states the results of her stress test were very good, so she does not understand why she is taking so many cardiac meds.//pt c/o occasional dizziness early in the morning before she gets up; no other Sx. or concerns.    History of Present Illness: [name] is a 70 y.o. female with a history of non-obs CAD w/ mLAD bridging by 2008 cath, MV 2014 nl perfusion/EF, HTN, MS, GERD  She had a stress test on 03/05/2015 which showed no ischemia or infarction and an EF of 66%, hyperdynamic LV function  [name] presents for follow-up of her stress test and she has concerns.  #1 Why should she be considered a heart patient and she just had a normal stress test   #2 does she need to be on all of these cardiac medications  #3 If the stress test was normal, what is the cause of her pain.  #4 If the episodes of pain are gone resolve in 10 minutes, that she needed to come to the hospital.  Since she had the stress test she has had no further episodes of pain. The episodes of pain are very similar. For each of them, she will have sudden onset of severe substernal crushing pain that radiates to both shoulders and down her arm. Is responsive to nitroglycerin. Each episode lasts about 10 minutes.   Past Medical History  Diagnosis Date  . MS (multiple sclerosis) ([place])     stable-sees [name] every 6 months  . Esophageal spasm   . Coronary artery disease   . Complication of anesthesia     s/p Hysterectomy "vagal response "heart stopped" -did not require shocking.  . Anginal pain ([place])     being evaluated by Dr. Tyrone Sage, arm pain,"bad indigestion" -no heart related findings as of yet  . GERD (gastroesophageal reflux  disease)   . Arthritis     hip. back pain  . Anemia     past history-many yrs ago  . Dry eyes     Past Surgical History  Procedure Laterality Date  . Transthoracic echocardiogram  07/02/11    LV CAVITY SIZE IS NORMAL. SYSTOLIC FUNCTION WAS NORMAL.EF=55% TO 60%.INCREASED RELATIVE CONTRIBUTION OF ATRIAL CONTRACTION TO VENTRICULAR FILLING;MAYBE DUE TO HYPOVOLEMIA. AV=MILD REGURG.  . Cardiology nuclear med study  06/22/12    NL LV FUNCTION,EF 68%,NL WALL MOTION.  . Cardiac catheterization  10/04/06    MINOR CAD,SINGLE VESSEL INVOLVING THE CIRCUMFLEX. 20 TO 30% PROXIMALLY AND 10 TO 20% IN THE MIDDLE SEGMENT.MILD MUSCLE BRIDGING, MID LAD.NORMAL RCA.NORMAL LV FUNCTION.NORMAL MITRAL AND AORTIC VALVE.NORMAL APPEARING AORTA,THORACIC AND ABDOMINAL.NORMAL RENAL ARTERIES.  . Carotid duplex  07/02/11    [id number] SOFT PLAQUE NOTED DISTAL CCA AND ORGIN AND PROXIMAL ICA,LEFT>RIGHT.NO ICA STENOSIS. VERTEBRAL ARTERY FLOW IS ANTEGRADE.  [name] Parathyroidectomy      partial-many years ago  . Abdominal hysterectomy    . Cesarean section      x2   . Knee arthroscopy Left     scope  . Thumb surgery Bilateral     built up and bone removal  . Tonsillectomy    . Blepharoplasty Bilateral   . Colonoscopy with propofol N/A 12/11/2014  Procedure: COLONOSCOPY WITH PROPOFOL;  Surgeon: Ronald Lobo, MD;  Location: [facility];  Service: Endoscopy;  Laterality: N/A;    Current Outpatient Prescriptions  Medication Sig Dispense Refill  . amLODipine (NORVASC) 5 MG tablet Take 1 tablet (5 mg total) by mouth daily. 30 tablet 10  . carbamazepine (TEGRETOL) 200 MG tablet Take 200 mg by mouth at bedtime as needed.     . carvedilol (COREG) 6.25 MG tablet TAKE 1 TABLET TWICE DAILY WITH FOOD. 60 tablet 6  . gabapentin (NEURONTIN) 300 MG capsule Take 600 mg by mouth 4 (four) times daily as needed.     . isosorbide mononitrate (IMDUR) 60 MG 24 hr tablet TAKE 1 TABLET ONCE DAILY. 30 tablet 6  . nitroGLYCERIN (NITROSTAT) 0.4 MG SL  tablet Place 1 tablet (0.4 mg total) under the tongue every 5 (five) minutes as needed for chest pain. 25 tablet 3  . PARoxetine (PAXIL) 10 MG tablet Take 5 mg by mouth every morning.     [name] PRILOSEC OTC 20 MG tablet Take 20 mg by mouth daily.     [name] VYTORIN 10-20 MG per tablet Take 1 tablet by mouth daily.     [name] aspirin EC 81 MG tablet Take 1 tablet (81 mg total) by mouth daily. 90 tablet 3   No current facility-administered medications for this visit.    Allergies:   Crestor and Vicodin    Social History:  The patient  reports that she has never smoked. She has never used smokeless tobacco. She reports that she drinks alcohol. She reports that she does not use illicit drugs.   Family History:  The patient's family history includes Heart disease in her mother; Hyperlipidemia in her brother.    ROS:  Please see the history of present illness. All other systems are reviewed and negative.    PHYSICAL EXAM: VS:  BP 134/70 mmHg  Pulse 64  Ht 5\' 3"  (1.6 m)  Wt 153 lb 3.2 oz (69.491 kg)  BMI 27.14 kg/m2 , BMI Body mass index is 27.14 kg/(m^2). GEN: Well nourished, well developed, female in no acute distress HEENT: normal for age  Neck: no JVD, no carotid bruit, no masses Cardiac: RRR; soft systolic murmur, no rubs, or gallops Respiratory:  clear to auscultation bilaterally, normal work of breathing GI: soft, nontender, nondistended, + BS MS: no deformity or atrophy; no edema; distal pulses are 2+ in all 4 extremities  Skin: warm and dry, no rash Neuro:  Strength and sensation are intact Psych: euthymic mood, full affect   EKG:  EKG is not ordered today.  Myoview 03/05/2015     Nuclear stress EF: 66%.  The left ventricular ejection fraction is hyperdynamic (>65%).  The study is normal.  Normal stress nuclear study with no ischemia or infarction; EF 66 with normal wall motion.     Recent Labs: No results found for requested labs within last 365 days.    Lipid  Panel No results found for: CHOL, TRIG, HDL, CHOLHDL, VLDL, LDLCALC, LDLDIRECT   Wt Readings from Last 3 Encounters:  04/16/15 153 lb 3.2 oz (69.491 kg)  03/05/15 151 lb (68.493 kg)  02/26/15 151 lb (68.493 kg)     Other studies Reviewed: Additional studies/ records that were reviewed today include: Cardiac cath report, office notes and other testing.  ASSESSMENT AND PLAN:  1.  Chest pain: The patient and I discussed the fact that although she had a normal stress test, she had some nonobstructive disease in the  past. I also explained what a myocardial bridge is and how this could give her chest pain even without obstructive heart disease. I explained what obstructive heart disease is and why we are treating her nonobstructive disease to prevent it from progressing.  I advised that if her episodes of chest pain continued to be 10 minutes or less and resolved with nitroglycerin, she does not need to come to the hospital. I advised that if her episodes of chest pain become worse or do not resolve easily, assessment is vital. I tried to help her understand that coronary artery disease can progress over time.  2. Hypertension: She does not ever recall being told that she was hypertensive and feels that this is an incorrect diagnosis. However, I reviewed all of the medications she is taking which can affect her blood pressure and she agrees that her blood pressure would certainly be higher than it is without them, putting her in to the category of hypertensive.   Current medicines are reviewed at length with the patient today.  The patient has concerns regarding medicines. Concerns were addressed  The following changes have been made:  Add aspirin 81 mg daily  Labs/ tests ordered today include:  No orders of the defined types were placed in this encounter.     Disposition:   FU with Dr. Georgina Peer in one year  Signed, Lenoard Aden  04/16/2015 5:28 PM    [address], [place], [place]  [address] Phone: [phone]; Fax: [phone]

## 2015-04-16 NOTE — Patient Instructions (Signed)
Medication Instructions:  Your physician has recommended you make the following change in your medication:  1) START Aspirin 81mg  by mouth ONCE daily   Labwork: none  Testing/Procedures: none  Follow-Up: Your physician wants you to follow-up in: 12 months with Dr. Claiborne Billings. You will receive a reminder letter in the mail two months in advance. If you don't receive a letter, please call our office to schedule the follow-up appointment.   Any Other Special Instructions Will Be Listed Below (If Applicable).     If you need a refill on your cardiac medications before your next appointment, please call your pharmacy.

## 2015-04-20 ENCOUNTER — Other Ambulatory Visit: Payer: Self-pay | Admitting: Cardiovascular Disease

## 2015-04-20 DIAGNOSIS — G35 Multiple sclerosis: Secondary | ICD-10-CM | POA: Diagnosis not present

## 2015-04-20 NOTE — Telephone Encounter (Signed)
Rx request sent to pharmacy.  

## 2015-04-25 ENCOUNTER — Observation Stay (HOSPITAL_COMMUNITY)
Admission: EM | Admit: 2015-04-25 | Discharge: 2015-04-26 | Disposition: A | Discharge status: Home / Self Care | Payer: Medicare Other | Attending: Internal Medicine | Admitting: Internal Medicine

## 2015-04-25 ENCOUNTER — Encounter (HOSPITAL_COMMUNITY): Payer: Self-pay | Admitting: Emergency Medicine

## 2015-04-25 ENCOUNTER — Emergency Department (HOSPITAL_COMMUNITY): Payer: Medicare Other

## 2015-04-25 DIAGNOSIS — R072 Precordial pain: Secondary | ICD-10-CM

## 2015-04-25 DIAGNOSIS — D72829 Elevated white blood cell count, unspecified: Secondary | ICD-10-CM | POA: Diagnosis not present

## 2015-04-25 DIAGNOSIS — I25119 Atherosclerotic heart disease of native coronary artery with unspecified angina pectoris: Secondary | ICD-10-CM | POA: Diagnosis not present

## 2015-04-25 DIAGNOSIS — Z9889 Other specified postprocedural states: Secondary | ICD-10-CM | POA: Insufficient documentation

## 2015-04-25 DIAGNOSIS — G35 Multiple sclerosis: Secondary | ICD-10-CM | POA: Insufficient documentation

## 2015-04-25 DIAGNOSIS — Z7982 Long term (current) use of aspirin: Secondary | ICD-10-CM | POA: Insufficient documentation

## 2015-04-25 DIAGNOSIS — Z7952 Long term (current) use of systemic steroids: Secondary | ICD-10-CM | POA: Diagnosis not present

## 2015-04-25 DIAGNOSIS — M542 Cervicalgia: Secondary | ICD-10-CM | POA: Diagnosis not present

## 2015-04-25 DIAGNOSIS — R079 Chest pain, unspecified: Principal | ICD-10-CM | POA: Insufficient documentation

## 2015-04-25 DIAGNOSIS — K224 Dyskinesia of esophagus: Secondary | ICD-10-CM | POA: Insufficient documentation

## 2015-04-25 DIAGNOSIS — M1990 Unspecified osteoarthritis, unspecified site: Secondary | ICD-10-CM | POA: Insufficient documentation

## 2015-04-25 DIAGNOSIS — K219 Gastro-esophageal reflux disease without esophagitis: Secondary | ICD-10-CM | POA: Insufficient documentation

## 2015-04-25 DIAGNOSIS — Z79899 Other long term (current) drug therapy: Secondary | ICD-10-CM | POA: Insufficient documentation

## 2015-04-25 DIAGNOSIS — I2510 Atherosclerotic heart disease of native coronary artery without angina pectoris: Secondary | ICD-10-CM | POA: Diagnosis present

## 2015-04-25 DIAGNOSIS — D649 Anemia, unspecified: Secondary | ICD-10-CM | POA: Insufficient documentation

## 2015-04-25 DIAGNOSIS — H04129 Dry eye syndrome of unspecified lacrimal gland: Secondary | ICD-10-CM | POA: Diagnosis not present

## 2015-04-25 LAB — BASIC METABOLIC PANEL
Anion gap: 14 (ref 5–15)
BUN: 17 mg/dL (ref 6–20)
CHLORIDE: 106 mmol/L (ref 101–111)
CO2: 20 mmol/L — ABNORMAL LOW (ref 22–32)
CREATININE: 0.8 mg/dL (ref 0.44–1.00)
Calcium: 9.2 mg/dL (ref 8.9–10.3)
GFR calc Af Amer: >60 mL/min (ref >60)
GFR calc non Af Amer: >60 mL/min (ref >60)
Glucose, Bld: 134 mg/dL — ABNORMAL HIGH (ref 65–99)
Potassium: 4.1 mmol/L (ref 3.5–5.1)
SODIUM: 140 mmol/L (ref 135–145)

## 2015-04-25 LAB — CBC
HCT: 40.3 % (ref 36.0–46.0)
Hemoglobin: 13.5 g/dL (ref 12.0–15.0)
MCH: 30.7 pg (ref 26.0–34.0)
MCHC: 33.5 g/dL (ref 30.0–36.0)
MCV: 91.6 fL (ref 78.0–100.0)
PLATELETS: 229 10*3/uL (ref 150–400)
RBC: 4.4 MIL/uL (ref 3.87–5.11)
RDW: 13.9 % (ref 11.5–15.5)
WBC: 19 10*3/uL — AB (ref 4.0–10.5)

## 2015-04-25 LAB — I-STAT TROPONIN, ED: Troponin i, poc: 0 ng/mL (ref 0.00–0.08)

## 2015-04-25 LAB — TROPONIN I

## 2015-04-25 MED ORDER — ENOXAPARIN SODIUM 40 MG/0.4ML ~~LOC~~ SOLN
40.0000 mg | SUBCUTANEOUS | Status: DC
  Administered 2015-04-25: 40 mg via SUBCUTANEOUS
  Filled 2015-04-25: qty 0.4

## 2015-04-25 MED ORDER — ASPIRIN EC 81 MG PO TBEC
81.0000 mg | DELAYED_RELEASE_TABLET | Freq: Every day | ORAL | Status: DC
  Filled 2015-04-25: qty 1

## 2015-04-25 MED ORDER — PAROXETINE HCL 10 MG PO TABS
5.0000 mg | ORAL_TABLET | Freq: Every morning | ORAL | Status: DC
  Administered 2015-04-26: 5 mg via ORAL
  Filled 2015-04-25: qty 0.5

## 2015-04-25 MED ORDER — NITROGLYCERIN 0.4 MG SL SUBL: 0.4000 mg | SUBLINGUAL_TABLET | SUBLINGUAL | Status: DC | PRN

## 2015-04-25 MED ORDER — EZETIMIBE-SIMVASTATIN 10-20 MG PO TABS
1.0000 | ORAL_TABLET | Freq: Every day | ORAL | Status: DC
  Administered 2015-04-25: 1 via ORAL
  Filled 2015-04-25 (×2): qty 1

## 2015-04-25 MED ORDER — PREDNISONE 20 MG PO TABS: 50.0000 mg | ORAL_TABLET | Freq: Every day | ORAL | Status: DC

## 2015-04-25 MED ORDER — AMLODIPINE BESYLATE 5 MG PO TABS
5.0000 mg | ORAL_TABLET | Freq: Every day | ORAL | Status: DC
  Administered 2015-04-26: 5 mg via ORAL
  Filled 2015-04-25: qty 1

## 2015-04-25 MED ORDER — FAMOTIDINE 20 MG PO TABS: 20.0000 mg | ORAL_TABLET | Freq: Two times a day (BID) | ORAL | Status: DC

## 2015-04-25 MED ORDER — PREDNISONE 20 MG PO TABS: 20.0000 mg | ORAL_TABLET | Freq: Every day | ORAL | Status: DC

## 2015-04-25 MED ORDER — ASPIRIN 81 MG PO CHEW: 324.0000 mg | CHEWABLE_TABLET | Freq: Once | ORAL | Status: DC

## 2015-04-25 MED ORDER — SODIUM CHLORIDE 0.9% FLUSH
3.0000 mL | Freq: Two times a day (BID) | INTRAVENOUS | Status: DC
  Administered 2015-04-25: 3 mL via INTRAVENOUS

## 2015-04-25 MED ORDER — GABAPENTIN 300 MG PO CAPS: 600.0000 mg | ORAL_CAPSULE | Freq: Four times a day (QID) | ORAL | Status: DC | PRN

## 2015-04-25 MED ORDER — CARVEDILOL 6.25 MG PO TABS
6.2500 mg | ORAL_TABLET | Freq: Two times a day (BID) | ORAL | Status: DC
  Administered 2015-04-25 – 2015-04-26 (×2): 6.25 mg via ORAL
  Filled 2015-04-25 (×2): qty 1

## 2015-04-25 MED ORDER — ISOSORBIDE MONONITRATE ER 60 MG PO TB24
60.0000 mg | ORAL_TABLET | Freq: Every day | ORAL | Status: DC
  Administered 2015-04-26: 60 mg via ORAL
  Filled 2015-04-25 (×2): qty 1

## 2015-04-25 MED ORDER — SODIUM CHLORIDE 0.9 % IV SOLN: INTRAVENOUS | Status: DC

## 2015-04-25 MED ORDER — PREDNISONE 20 MG PO TABS: 100.0000 mg | ORAL_TABLET | Freq: Every day | ORAL | Status: DC

## 2015-04-25 MED ORDER — FAMOTIDINE 20 MG PO TABS
20.0000 mg | ORAL_TABLET | Freq: Two times a day (BID) | ORAL | Status: DC
  Administered 2015-04-25 – 2015-04-26 (×2): 20 mg via ORAL
  Filled 2015-04-25 (×2): qty 1

## 2015-04-25 MED ORDER — PREDNISONE 20 MG PO TABS
60.0000 mg | ORAL_TABLET | Freq: Every day | ORAL | Status: AC
  Administered 2015-04-25: 60 mg via ORAL
  Filled 2015-04-25: qty 3

## 2015-04-25 NOTE — ED Provider Notes (Signed)
CSN: [id number]     Arrival date & time 04/25/15  1237 History   First MD Initiated Contact with Patient 04/25/15 1239     Chief Complaint  Patient presents with  . Chest Pain     (Consider location/radiation/quality/duration/timing/severity/associated sxs/prior Treatment) Patient is a 70 y.o. female presenting with chest pain. The history is provided by the patient.  Chest Pain Associated symptoms: no abdominal pain, no back pain, no fever, no headache and no shortness of breath    patient status post cardiac cath 15 years ago. Recently had stress test done for some episodes of chest pain. Without any positive results. Patient was informed by her cardiologist that if the chest pain did not resolve with nitroglycerin to come in to be seen. That happened today onset around 11 02/24/1929 minutes of chest pain not responding to nitroglycerin. Patient is now pain-free. No nausea no vomiting no shortness of breath no diaphoresis. Patient's cath 15 years ago showed evidence of small blockages. Room air sats are 95%. The pain did radiate up into the neck area.  Past Medical History  Diagnosis Date  . MS (multiple sclerosis) ([place])     stable-sees [name] every 6 months  . Esophageal spasm   . Coronary artery disease   . Complication of anesthesia     s/p Hysterectomy "vagal response "heart stopped" -did not require shocking.  . Anginal pain ([place])     being evaluated by Dr. Tyrone Sage, arm pain,"bad indigestion" -no heart related findings as of yet  . GERD (gastroesophageal reflux disease)   . Arthritis     hip. back pain  . Anemia     past history-many yrs ago  . Dry eyes    Past Surgical History  Procedure Laterality Date  . Transthoracic echocardiogram  07/02/11    LV CAVITY SIZE IS NORMAL. SYSTOLIC FUNCTION WAS NORMAL.EF=55% TO 60%.INCREASED RELATIVE CONTRIBUTION OF ATRIAL CONTRACTION TO VENTRICULAR FILLING;MAYBE DUE TO HYPOVOLEMIA. AV=MILD REGURG.  . Cardiology nuclear med study   06/22/12    NL LV FUNCTION,EF 68%,NL WALL MOTION.  . Cardiac catheterization  10/04/06    MINOR CAD,SINGLE VESSEL INVOLVING THE CIRCUMFLEX. 20 TO 30% PROXIMALLY AND 10 TO 20% IN THE MIDDLE SEGMENT.MILD MUSCLE BRIDGING, MID LAD.NORMAL RCA.NORMAL LV FUNCTION.NORMAL MITRAL AND AORTIC VALVE.NORMAL APPEARING AORTA,THORACIC AND ABDOMINAL.NORMAL RENAL ARTERIES.  . Carotid duplex  07/02/11    [id number] SOFT PLAQUE NOTED DISTAL CCA AND ORGIN AND PROXIMAL ICA,LEFT>RIGHT.NO ICA STENOSIS. VERTEBRAL ARTERY FLOW IS ANTEGRADE.  [name] Parathyroidectomy      partial-many years ago  . Abdominal hysterectomy    . Cesarean section      x2   . Knee arthroscopy Left     scope  . Thumb surgery Bilateral     built up and bone removal  . Tonsillectomy    . Blepharoplasty Bilateral   . Colonoscopy with propofol N/A 12/11/2014    Procedure: COLONOSCOPY WITH PROPOFOL;  Surgeon: Ronald Lobo, MD;  Location: [facility];  Service: Endoscopy;  Laterality: N/A;   Family History  Problem Relation Age of Onset  . Heart disease Mother   . Hyperlipidemia Brother    Social History  Substance Use Topics  . Smoking status: Never Smoker   . Smokeless tobacco: Never Used  . Alcohol Use: Yes     Comment: wine occ.   OB History    No data available     Review of Systems  Constitutional: Negative for fever.  HENT: Negative for congestion.   Eyes: Negative  for visual disturbance.  Respiratory: Negative for shortness of breath.   Cardiovascular: Positive for chest pain.  Gastrointestinal: Negative for abdominal pain.  Genitourinary: Negative for dysuria.  Musculoskeletal: Positive for neck pain. Negative for back pain.  Skin: Negative for rash.  Neurological: Negative for headaches.  Hematological: Does not bruise/bleed easily.  Psychiatric/Behavioral: Negative for confusion.      Allergies  Crestor and Vicodin  Home Medications   Prior to Admission medications   Medication Sig Start Date End Date Taking?  Authorizing Provider  amLODipine (NORVASC) 5 MG tablet Take 1 tablet (5 mg total) by mouth daily. 05/06/14  Yes Troy Sine, MD  aspirin EC 81 MG tablet Take 1 tablet (81 mg total) by mouth daily. 04/16/15  Yes Rhonda G Barrett, PA-C  carbamazepine (TEGRETOL) 200 MG tablet Take 200 mg by mouth at bedtime as needed.  05/30/13  Yes Historical Provider, MD  carvedilol (COREG) 6.25 MG tablet TAKE 1 TABLET TWICE DAILY WITH FOOD. 11/05/14  Yes Troy Sine, MD  gabapentin (NEURONTIN) 300 MG capsule Take 600 mg by mouth 4 (four) times daily as needed.    Yes Historical Provider, MD  isosorbide mononitrate (IMDUR) 60 MG 24 hr tablet TAKE 1 TABLET ONCE DAILY. 04/20/15  Yes Troy Sine, MD  nitroGLYCERIN (NITROSTAT) 0.4 MG SL tablet Place 1 tablet (0.4 mg total) under the tongue every 5 (five) minutes as needed for chest pain. 08/29/13  Yes Troy Sine, MD  PARoxetine (PAXIL) 10 MG tablet Take 5 mg by mouth every morning.    Yes Historical Provider, MD  predniSONE (DELTASONE) 20 MG tablet Take 20 mg by mouth daily with breakfast.   Yes Historical Provider, MD  PRILOSEC OTC 20 MG tablet Take 20 mg by mouth daily.  07/22/13  Yes Historical Provider, MD  ranitidine (ZANTAC) 150 MG tablet Take 150 mg by mouth 2 (two) times daily.   Yes Historical Provider, MD  VYTORIN 10-20 MG per tablet Take 1 tablet by mouth daily.  07/30/13  Yes Historical Provider, MD   BP 126/65 mmHg  Pulse 87  Temp(Src) 98.1 F (36.7 C) (Oral)  Resp 18  Ht 5\' 3"  (1.6 m)  Wt 68.04 kg  BMI 26.58 kg/m2  SpO2 99% Physical Exam  Constitutional: She is oriented to person, place, and time. She appears well-developed and well-nourished. No distress.  HENT:  Head: Normocephalic and atraumatic.  Mouth/Throat: Oropharynx is clear and moist.  Eyes: Conjunctivae and EOM are normal. Pupils are equal, round, and reactive to light.  Neck: Normal range of motion. Neck supple.  Cardiovascular: Normal rate, regular rhythm and normal heart sounds.    No murmur heard. Pulmonary/Chest: Effort normal and breath sounds normal. No respiratory distress.  Abdominal: Soft. Bowel sounds are normal. There is no tenderness.  Musculoskeletal: Normal range of motion.  Neurological: She is alert and oriented to person, place, and time. No cranial nerve deficit. She exhibits normal muscle tone. Coordination normal.  Skin: Skin is warm. No rash noted.  Nursing note and vitals reviewed.   ED Course  Procedures (including critical care time) Labs Review Labs Reviewed  BASIC METABOLIC PANEL - Abnormal; Notable for the following:    CO2 20 (*)    Glucose, Bld 134 (*)    All other components within normal limits  CBC - Abnormal; Notable for the following:    WBC 19.0 (*)    All other components within normal limits  URINALYSIS, ROUTINE W REFLEX MICROSCOPIC (NOT AT  [facility])  [facility], ED   Results for orders placed or performed during the hospital encounter of [id number]  Basic metabolic panel  Result Value Ref Range   Sodium 140 135 - 145 mmol/L   Potassium 4.1 3.5 - 5.1 mmol/L   Chloride 106 101 - 111 mmol/L   CO2 20 (L) 22 - 32 mmol/L   Glucose, Bld 134 (H) 65 - 99 mg/dL   BUN 17 6 - 20 mg/dL   Creatinine, Ser 0.80 0.44 - 1.00 mg/dL   Calcium 9.2 8.9 - 10.3 mg/dL   GFR calc non Af Amer >60 >60 mL/min   GFR calc Af Amer >60 >60 mL/min   Anion gap 14 5 - 15  CBC  Result Value Ref Range   WBC 19.0 (H) 4.0 - 10.5 K/uL   RBC 4.40 3.87 - 5.11 MIL/uL   Hemoglobin 13.5 12.0 - 15.0 g/dL   HCT 40.3 36.0 - 46.0 %   MCV 91.6 78.0 - 100.0 fL   MCH 30.7 26.0 - 34.0 pg   MCHC 33.5 30.0 - 36.0 g/dL   RDW 13.9 11.5 - 15.5 %   Platelets 229 150 - 400 K/uL  I-stat troponin, ED (not at [facility])  Result Value Ref Range   Troponin i, poc 0.00 0.00 - 0.08 ng/mL   Comment 3             Imaging Review Dg Chest 2 View  04/25/2015  CLINICAL DATA:  Mid chest pain EXAM: CHEST  2 VIEW COMPARISON:  01/28/2015. FINDINGS: Stable appearance of  prominent anterior right first rib and. The lungs are clear wiithout focal pneumonia, edema, pneumothorax or pleural effusion. Interstitial markings are diffusely coarsened with chronic features. The cardiopericardial silhouette is within normal limits for size. The visualized bony structures of the thorax are intact. IMPRESSION: No active cardiopulmonary disease. Electronically Signed   By: Misty Stanley M.D.   On: 04/25/2015 13:29   I have personally reviewed and evaluated these images and lab results as part of my medical decision-making.   EKG Interpretation   Date/Time:  Saturday April 25 2015 12:40:57 EST Ventricular Rate:  57 PR Interval:  107 QRS Duration: 74 QT Interval:  403 QTC Calculation: 392 R Axis:   62 Text Interpretation:  Sinus rhythm Ventricular premature complex Short PR  interval Nonspecific T abnrm, anterolateral leads Confirmed by Hernando Reali   MD, Cristofher Livecchi ([id number]) on 04/25/2015 12:47:55 PM      MDM   Final diagnoses:  Chest pain, unspecified chest pain type    Patient with an episode of chest pain today lasting 30 minutes that did not respond to her nitroglycerin. Her cardiologist Dr. Claiborne Billings informed her to come in if that occurred. Patient's been having some increased problems with chest pain over the last couple months. Did recently had a stress test which was fine. However patient continues to have pain over the pain does respond to nitroglycerin normally it lasts less than 10 minutes. Today was an exception. Patient's initial cardiac markers are negative. Chest x-rays negative for pneumonia pneumothorax. Nonspecific T wave changes on the EKG. Patient needs observation admission for rule out probably consultation with cardiology determine whether they want to proceed to cardiac cath to determined exactly what the anatomy is showing. Patient is currently pain-free.    Fredia Sorrow, MD 04/25/15 1529

## 2015-04-25 NOTE — ED Notes (Signed)
Pt arrives via [facility] from home reporting CP new at 1100.  Pt reports central CP radiating to neck.  Pt denies n/v, reports some abdominal discomfort.  Pt reports taking 2 NTG and 324 ASA at home.  Pain resolved.  Resp e/u, NAD noted at this time.

## 2015-04-25 NOTE — ED Notes (Signed)
Admitting MD at bedside.

## 2015-04-25 NOTE — H&P (Signed)
[facility] History and Physical  [name] [id number] DOB: [birth year] DOA: 04/25/2015  Referring physician:  PCP: Horatio Pel, MD   Chief Complaint: Chest pain  HPI: [name] is a 70 y.o. female with a history of multiple sclerosis, nonobstructive CAD, Hypertension and GERD, who presented to the emergency department with chest pain. Symptoms begun around noon, consisting of severe substernal chest pain, radiating to the neck and intermittent palpitations. She had taken 2 nitroglycerins and four aspirins at home with resolution of symptoms, She had about 6 of these episodes over the last 2 months, but this last chest pain event lasted about 30 mins. Denies fevers, chills, or night sweats.Denies any respiratory complaints. Denies lower extremity swelling or myalgias. Denies nausea or vomiting. She has GERD symptoms with esophageal spasms. Denies abdominal pain. Appetite is normal. Denies any dysuria. Denies abnormal skin rashes. She reports occasional dizziness.  In the ED, Troponins are negative to date. Last echo in 06/2011 normal w EF 55%. Of note, she had undergone several catheterizations, the latest on 2008, most recently a stress test on 03/05/2015 negative for ischemia or infarction with ejection fraction of 66%, and hyper dynamic LV function at 68 %. CBC remarkable for likely reactive leukocytosis, BMET unremarkable  Review of Systems:  Constitutional:  No weight loss, night sweats, Fevers, chills, fatigue.  HEENT:  No headaches, Difficulty swallowing,Tooth/dental problems, Sore throat,  No sneezing, itching, ear ache, nasal congestion, post nasal drip,  Cardio-vascular:  +chest pain, Orthopnea, PND, swelling in lower extremities, anasarca, + occasional dizziness, intermittent palpitations  GI:  + GERD sx, indigestion, occasional abdominal pain, nausea, vomiting, diarrhea, change in bowel habits, loss of appetite  Resp:  No shortness of breath with  exertion or at rest. No excess mucus, no productive cough, No non-productive cough, No coughing up of blood. No change in color of mucus.No wheezing. No chest wall deformity  Skin:  no rash or lesions.  GU:  no dysuria, change in color of urine, no urgency or frequency. No flank pain.  Musculoskeletal:  No joint pain or swelling. No decreased range of motion. No back pain.  Psych:  No change in mood or affect. No depression or anxiety. No memory loss.  Neuro: The patient has a history of multiple sclerosis  Past Medical History  Diagnosis Date  . MS (multiple sclerosis) ([place])     stable-sees [name] every 6 months  . Esophageal spasm   . Coronary artery disease   . Complication of anesthesia     s/p Hysterectomy "vagal response "heart stopped" -did not require shocking.  . Anginal pain ([place])     being evaluated by Dr. Tyrone Sage, arm pain,"bad indigestion" -no heart related findings as of yet  . GERD (gastroesophageal reflux disease)   . Arthritis     hip. back pain  . Anemia     past history-many yrs ago  . Dry eyes    Past Surgical History  Procedure Laterality Date  . Transthoracic echocardiogram  07/02/11    LV CAVITY SIZE IS NORMAL. SYSTOLIC FUNCTION WAS NORMAL.EF=55% TO 60%.INCREASED RELATIVE CONTRIBUTION OF ATRIAL CONTRACTION TO VENTRICULAR FILLING;MAYBE DUE TO HYPOVOLEMIA. AV=MILD REGURG.  . Cardiology nuclear med study  06/22/12    NL LV FUNCTION,EF 68%,NL WALL MOTION.  . Cardiac catheterization  10/04/06    MINOR CAD,SINGLE VESSEL INVOLVING THE CIRCUMFLEX. 20 TO 30% PROXIMALLY AND 10 TO 20% IN THE MIDDLE SEGMENT.MILD MUSCLE BRIDGING, MID LAD.NORMAL RCA.NORMAL LV FUNCTION.NORMAL MITRAL AND AORTIC VALVE.NORMAL APPEARING AORTA,THORACIC  AND ABDOMINAL.NORMAL RENAL ARTERIES.  . Carotid duplex  07/02/11    [id number] SOFT PLAQUE NOTED DISTAL CCA AND ORGIN AND PROXIMAL ICA,LEFT>RIGHT.NO ICA STENOSIS. VERTEBRAL ARTERY FLOW IS ANTEGRADE.  [name] Parathyroidectomy      partial-many years ago    . Abdominal hysterectomy    . Cesarean section      x2   . Knee arthroscopy Left     scope  . Thumb surgery Bilateral     built up and bone removal  . Tonsillectomy    . Blepharoplasty Bilateral   . Colonoscopy with propofol N/A 12/11/2014    Procedure: COLONOSCOPY WITH PROPOFOL;  Surgeon: Ronald Lobo, MD;  Location: [facility];  Service: Endoscopy;  Laterality: N/A;   Social History:  reports that she has never smoked. She has never used smokeless tobacco. She reports that she drinks alcohol. She reports that she does not use illicit drugs.  Allergies  Allergen Reactions  . Crestor [Rosuvastatin] Other (See Comments)    UNSPECIFIED   . Vicodin [Hydrocodone-Acetaminophen]     Family History  Problem Relation Age of Onset  . Heart disease Mother   . Hyperlipidemia Brother    Prior to Admission medications   Medication Sig Start Date End Date Taking? Authorizing Provider  amLODipine (NORVASC) 5 MG tablet Take 1 tablet (5 mg total) by mouth daily. 05/06/14  Yes Troy Sine, MD  aspirin EC 81 MG tablet Take 1 tablet (81 mg total) by mouth daily. 04/16/15  Yes Rhonda G Barrett, PA-C  carbamazepine (TEGRETOL) 200 MG tablet Take 200 mg by mouth at bedtime as needed.  05/30/13  Yes Historical Provider, MD  carvedilol (COREG) 6.25 MG tablet TAKE 1 TABLET TWICE DAILY WITH FOOD. 11/05/14  Yes Troy Sine, MD  gabapentin (NEURONTIN) 300 MG capsule Take 600 mg by mouth 4 (four) times daily as needed.    Yes Historical Provider, MD  isosorbide mononitrate (IMDUR) 60 MG 24 hr tablet TAKE 1 TABLET ONCE DAILY. 04/20/15  Yes Troy Sine, MD  nitroGLYCERIN (NITROSTAT) 0.4 MG SL tablet Place 1 tablet (0.4 mg total) under the tongue every 5 (five) minutes as needed for chest pain. 08/29/13  Yes Troy Sine, MD  PARoxetine (PAXIL) 10 MG tablet Take 5 mg by mouth every morning.    Yes Historical Provider, MD  predniSONE (DELTASONE) 20 MG tablet Take 20 mg by mouth daily with breakfast.   Yes  Historical Provider, MD  PRILOSEC OTC 20 MG tablet Take 20 mg by mouth daily.  07/22/13  Yes Historical Provider, MD  ranitidine (ZANTAC) 150 MG tablet Take 150 mg by mouth 2 (two) times daily.   Yes Historical Provider, MD  VYTORIN 10-20 MG per tablet Take 1 tablet by mouth daily.  07/30/13  Yes Historical Provider, MD   Physical Exam: Filed Vitals:   04/25/15 1448 04/25/15 1515 04/25/15 1600 04/25/15 1700  BP: 126/65 120/68 110/65 127/65  Pulse: 87 56 57 57  Temp:    98.3 F (36.8 C)  TempSrc:    Oral  Resp: 18 20 17 16   Height:    5\' 3"  (1.6 m)  Weight:    69.6 kg (153 lb 7 oz)  SpO2: 99% 95% 96% 97%    Wt Readings from Last 3 Encounters:  04/25/15 69.6 kg (153 lb 7 oz)  04/16/15 69.491 kg (153 lb 3.2 oz)  03/05/15 68.493 kg (151 lb)    General:  Appears calm and comfortable Eyes: PERRL, normal lids, irises &  conjunctiva ENT: grossly normal hearing, lips & tongue Neck: no LAD, masses or thyromegaly Cardiovascular: RRR, no m/r/g. No LE edema. Telemetry: SR, no arrhythmias. Very soft murmur at the LUSB Respiratory: CTA bilaterally, no w/r/r. Normal respiratory effort. Abdomen: soft, ntnd Skin: no rash or induration seen on limited exam Musculoskeletal: grossly normal tone BUE/BLE Psychiatric: grossly normal mood and affect, speech fluent and appropriate Neurologic: grossly non-focal. Known MS without recent flare.          Labs on Admission:  Basic Metabolic Panel:  Recent Labs Lab 04/25/15 1255  NA 140  K 4.1  CL 106  CO2 20*  GLUCOSE 134*  BUN 17  CREATININE 0.80  CALCIUM 9.2   Liver Function Tests: No results for input(s): AST, ALT, ALKPHOS, BILITOT, PROT, ALBUMIN in the last 168 hours. No results for input(s): LIPASE, AMYLASE in the last 168 hours. No results for input(s): AMMONIA in the last 168 hours. CBC:  Recent Labs Lab 04/25/15 1255  WBC 19.0*  HGB 13.5  HCT 40.3  MCV 91.6  PLT 229   Cardiac Enzymes: No results for input(s): CKTOTAL,  CKMB, CKMBINDEX, TROPONINI in the last 168 hours.    Radiological Exams on Admission: Dg Chest 2 View  04/25/2015  CLINICAL DATA:  Mid chest pain EXAM: CHEST  2 VIEW COMPARISON:  01/28/2015. FINDINGS: Stable appearance of prominent anterior right first rib and. The lungs are clear wiithout focal pneumonia, edema, pneumothorax or pleural effusion. Interstitial markings are diffusely coarsened with chronic features. The cardiopericardial silhouette is within normal limits for size. The visualized bony structures of the thorax are intact. IMPRESSION: No active cardiopulmonary disease. Electronically Signed   By: Misty Stanley M.D.   On: 04/25/2015 13:29    EKG: Ventricular premature complex Short PR interval Nonspecific T abnrm, anterolateral leads    Assessment/Plan Active Problems:   Chest pain   Multiple sclerosis (HCC)   GERD (gastroesophageal reflux disease)  Chest pain, These are recurrent symptoms ECG shows Ventricular premature complex Short PR interval Nonspecific T abnrm, anterolateral leads G, Risk factors include non obstructive CAD and MS  Troponin negative to date.   Differential includes ACS, GERD, Musculoskeletal pain.   Will admit to telemetry,  and consult Cardiology,  Dr. Patrecia Pour with [facility] [phone]. He recommends 1 more troponin before concluding that her symptoms are of cardiac nature. He will contact MD on call to determine urgency of the consult. -  Daily aspirin -  Beta blocker and high dose statin -  NTG prn chest pain -  GI prophylaxis  Code Status:  Full Code  DVT Prophylaxis: [place] Family Communication:  Daughter at bedside Disposition Plan: obs to telemetry    [facility] Pager [phone]

## 2015-04-26 DIAGNOSIS — R079 Chest pain, unspecified: Secondary | ICD-10-CM | POA: Diagnosis not present

## 2015-04-26 DIAGNOSIS — G35 Multiple sclerosis: Secondary | ICD-10-CM

## 2015-04-26 DIAGNOSIS — K219 Gastro-esophageal reflux disease without esophagitis: Secondary | ICD-10-CM | POA: Diagnosis not present

## 2015-04-26 DIAGNOSIS — D72829 Elevated white blood cell count, unspecified: Secondary | ICD-10-CM

## 2015-04-26 LAB — TROPONIN I

## 2015-04-26 LAB — BASIC METABOLIC PANEL
Anion gap: 8 (ref 5–15)
BUN: 16 mg/dL (ref 6–20)
CHLORIDE: 106 mmol/L (ref 101–111)
CO2: 27 mmol/L (ref 22–32)
CREATININE: 0.73 mg/dL (ref 0.44–1.00)
Calcium: 8.9 mg/dL (ref 8.9–10.3)
GFR calc Af Amer: >60 mL/min (ref >60)
GFR calc non Af Amer: >60 mL/min (ref >60)
GLUCOSE: 148 mg/dL — AB (ref 65–99)
POTASSIUM: 3.8 mmol/L (ref 3.5–5.1)
Sodium: 141 mmol/L (ref 135–145)

## 2015-04-26 LAB — URINALYSIS, ROUTINE W REFLEX MICROSCOPIC
BILIRUBIN URINE: NEGATIVE
Glucose, UA: NEGATIVE mg/dL
Hgb urine dipstick: NEGATIVE
KETONES UR: NEGATIVE mg/dL
LEUKOCYTES UA: NEGATIVE
NITRITE: NEGATIVE
PH: 6.5 (ref 5.0–8.0)
PROTEIN: NEGATIVE mg/dL
Specific Gravity, Urine: 1.011 (ref 1.005–1.030)

## 2015-04-26 LAB — CBC
HEMATOCRIT: 39.5 % (ref 36.0–46.0)
Hemoglobin: 13.1 g/dL (ref 12.0–15.0)
MCH: 30.5 pg (ref 26.0–34.0)
MCHC: 33.2 g/dL (ref 30.0–36.0)
MCV: 91.9 fL (ref 78.0–100.0)
PLATELETS: 203 10*3/uL (ref 150–400)
RBC: 4.3 MIL/uL (ref 3.87–5.11)
RDW: 13.9 % (ref 11.5–15.5)
WBC: 14.9 10*3/uL — ABNORMAL HIGH (ref 4.0–10.5)

## 2015-04-26 LAB — PROTIME-INR
INR: 1.04 (ref 0.00–1.49)
Prothrombin Time: 13.8 seconds (ref 11.6–15.2)

## 2015-04-26 MED ORDER — PREDNISONE 20 MG PO TABS
40.0000 mg | ORAL_TABLET | Freq: Two times a day (BID) | ORAL | Status: DC
  Administered 2015-04-26: 40 mg via ORAL
  Filled 2015-04-26: qty 2

## 2015-04-26 MED ORDER — AMLODIPINE BESYLATE 10 MG PO TABS: 10.0000 mg | ORAL_TABLET | Freq: Every day | ORAL | Status: DC

## 2015-04-26 MED ORDER — RANITIDINE HCL 150 MG PO TABS: 150.0000 mg | ORAL_TABLET | Freq: Every day | ORAL | Status: DC

## 2015-04-26 MED ORDER — PANTOPRAZOLE SODIUM 40 MG PO TBEC
40.0000 mg | DELAYED_RELEASE_TABLET | Freq: Two times a day (BID) | ORAL | Status: DC
  Filled 2015-04-26: qty 1

## 2015-04-26 MED ORDER — PREDNISONE 20 MG PO TABS: 20.0000 mg | ORAL_TABLET | Freq: Once | ORAL | Status: DC

## 2015-04-26 MED ORDER — PANTOPRAZOLE SODIUM 40 MG PO TBEC: 40.0000 mg | DELAYED_RELEASE_TABLET | Freq: Two times a day (BID) | ORAL | Status: DC

## 2015-04-26 NOTE — Discharge Summary (Signed)
Physician Discharge Summary  [name] [id number] DOB: April 18, 1945 DOA: 04/25/2015  PCP: Horatio Pel, MD  Admit date: 04/25/2015 Discharge date: 04/26/2015  Time spent: 35 minutes  Recommendations for Outpatient Follow-up:  1. Repeat BMET to follow electrolyte sand renal function 2. Repeat CBC to follow up WBC's trend 3. Please follow up symptoms improvement/resolution with PPI therapy; if has failed to improved, arrange follow up with GI for further evaluation and discussion about needs for EGD 4. Reassess BP and adjust medications as needed   Discharge Diagnoses:  Principal Problem:   Chest pain Active Problems:   Mild CAD   Multiple sclerosis (HCC)   GERD (gastroesophageal reflux disease)   Leukocytosis depression   Discharge Condition: stable and improved. Discharge home with instructions to follow with PCP in 10 days  Diet recommendation: heart healthy diet   Filed Weights   04/25/15 1242 04/25/15 1700 04/26/15 0530  Weight: 68.04 kg (150 lb) 69.6 kg (153 lb 7 oz) 69.3 kg (152 lb 12.5 oz)    History of present illness:  70 y.o. female with a history of multiple sclerosis, nonobstructive CAD, Hypertension and GERD, who presented to the emergency department with chest pain. Symptoms begun around noon, consisting of severe substernal chest pain, radiating to the neck and intermittent palpitations. She had taken 2 nitroglycerins and four aspirins at home with resolution of symptoms, She had about 6 of these episodes over the last 2 months, but this last chest pain event lasted about 30 mins. Denies fevers, chills, or night sweats.Denies any respiratory complaints. Denies lower extremity swelling or myalgias. Denies nausea or vomiting. She has GERD symptoms with esophageal spasms. Denies abdominal pain. Appetite is normal. Denies any dysuria. Denies abnormal skin rashes. She reports occasional dizziness. In the ED, Troponins are negative to date. Last echo in  06/2011 normal w EF 55%. Of note, she had undergone several catheterizations, the latest on 2008, most recently a stress test on 03/05/2015 negative for ischemia or infarction with ejection fraction of 66%, and hyper dynamic LV function at 68 %.  Hospital Course:  1-CP: r/o MI -neg troponin X 3 -no EKG or telemetry changes -pain most likely secondary to GERD (especially with recent increase use of prednisone for MS flare) -will continue B-blockers, statins, nitrates and Aspirin  -PPI BID and bedtime Zantac  2-GERD: -will treat with PPI and Zantac QHS -instruction for food choices and lifestyle changes provided -if symptoms failed to improved; will benefit of GI consult  3-Essential HTN -will continue home medications; but amlodipine adjusted to 10mg  daily for better control -advise to follow heart healthy diet  4-MS: -will continue outpatient follow up with her neurology -currently following steroids tapering   5-HLD: -continue Vytorin  6-depression: -no SI or hallucinations -will continue home medication regimen   7-leukocytosis -reactive; no signs of infection and trending down   Procedures:  See below for x-ray reports   Consultations:  None   Discharge Exam: Filed Vitals:   04/25/15 2119 04/26/15 0530  BP: 113/52 151/72  Pulse: 68 52  Temp: 97.9 F (36.6 C) 98.2 F (36.8 C)  Resp:  18    General: afebrile, currently w/o CP or SOB Cardiovascular: S1 and S2, no rubs or gallops Respiratory: CTA bilaterally Abd: soft, NT, ND, positive BS Extremities: no edema or cyanosis   Discharge Instructions   Discharge Instructions    Diet - low sodium heart healthy    Complete by:  As directed      Discharge  instructions    Complete by:  As directed   Take medications as prescribed Please arrange follow up with PCP in 10 days Follow lifestyle changes and rec's to help reflux control Maintain adequate hydration          Current Discharge Medication List     START taking these medications   Details  pantoprazole (PROTONIX) 40 MG tablet Take 1 tablet (40 mg total) by mouth 2 (two) times daily. Qty: 60 tablet, Refills: 2      CONTINUE these medications which have CHANGED   Details  amLODipine (NORVASC) 10 MG tablet Take 1 tablet (10 mg total) by mouth daily. Qty: 30 tablet, Refills: 2    ranitidine (ZANTAC) 150 MG tablet Take 1 tablet (150 mg total) by mouth at bedtime.      CONTINUE these medications which have NOT CHANGED   Details  aspirin EC 81 MG tablet Take 1 tablet (81 mg total) by mouth daily. Qty: 90 tablet, Refills: 3    carbamazepine (TEGRETOL) 200 MG tablet Take 200 mg by mouth at bedtime as needed.     carvedilol (COREG) 6.25 MG tablet TAKE 1 TABLET TWICE DAILY WITH FOOD. Qty: 60 tablet, Refills: 6    gabapentin (NEURONTIN) 300 MG capsule Take 600 mg by mouth 4 (four) times daily as needed.     isosorbide mononitrate (IMDUR) 60 MG 24 hr tablet TAKE 1 TABLET ONCE DAILY. Qty: 30 tablet, Refills: 11    nitroGLYCERIN (NITROSTAT) 0.4 MG SL tablet Place 1 tablet (0.4 mg total) under the tongue every 5 (five) minutes as needed for chest pain. Qty: 25 tablet, Refills: 3    PARoxetine (PAXIL) 10 MG tablet Take 5 mg by mouth every morning.     predniSONE (DELTASONE) 20 MG tablet Take 20 mg by mouth daily with breakfast.    VYTORIN 10-20 MG per tablet Take 1 tablet by mouth daily.       STOP taking these medications     PRILOSEC OTC 20 MG tablet        Allergies  Allergen Reactions  . Crestor [Rosuvastatin] Other (See Comments)    UNSPECIFIED   . Vicodin [Hydrocodone-Acetaminophen]    Follow-up Information    Follow up with Horatio Pel, MD. Schedule an appointment as soon as possible for a visit in 10 days.   Specialty:  Internal Medicine   Contact information:   [address] [place] [address] [phone]        The results of significant diagnostics from this  hospitalization (including imaging, microbiology, ancillary and laboratory) are listed below for reference.    Significant Diagnostic Studies: Dg Chest 2 View  04/25/2015  CLINICAL DATA:  Mid chest pain EXAM: CHEST  2 VIEW COMPARISON:  01/28/2015. FINDINGS: Stable appearance of prominent anterior right first rib and. The lungs are clear wiithout focal pneumonia, edema, pneumothorax or pleural effusion. Interstitial markings are diffusely coarsened with chronic features. The cardiopericardial silhouette is within normal limits for size. The visualized bony structures of the thorax are intact. IMPRESSION: No active cardiopulmonary disease. Electronically Signed   By: Misty Stanley M.D.   On: 04/25/2015 13:29   Labs: Basic Metabolic Panel:  Recent Labs Lab 04/25/15 1255 04/26/15 0626  NA 140 141  K 4.1 3.8  CL 106 106  CO2 20* 27  GLUCOSE 134* 148*  BUN 17 16  CREATININE 0.80 0.73  CALCIUM 9.2 8.9   CBC:  Recent Labs Lab 04/25/15 1255 04/26/15 0626  WBC 19.0* 14.9*  HGB 13.5 13.1  HCT 40.3 39.5  MCV 91.6 91.9  PLT 229 203   Cardiac Enzymes:  Recent Labs Lab 04/25/15 1837 04/26/15 0008 04/26/15 0626  TROPONINI <0.03 <0.03 <0.03   Signed:  Barton Dubois MD.  [facility] 04/26/2015, 1:04 PM

## 2015-04-30 DIAGNOSIS — I10 Essential (primary) hypertension: Secondary | ICD-10-CM | POA: Diagnosis not present

## 2015-04-30 DIAGNOSIS — G35 Multiple sclerosis: Secondary | ICD-10-CM | POA: Diagnosis not present

## 2015-04-30 DIAGNOSIS — D72829 Elevated white blood cell count, unspecified: Secondary | ICD-10-CM | POA: Diagnosis not present

## 2015-04-30 DIAGNOSIS — R739 Hyperglycemia, unspecified: Secondary | ICD-10-CM | POA: Diagnosis not present

## 2015-04-30 DIAGNOSIS — K219 Gastro-esophageal reflux disease without esophagitis: Secondary | ICD-10-CM | POA: Diagnosis not present

## 2015-05-01 DIAGNOSIS — B370 Candidal stomatitis: Secondary | ICD-10-CM | POA: Diagnosis not present

## 2015-05-08 ENCOUNTER — Other Ambulatory Visit: Payer: Self-pay | Admitting: Gastroenterology

## 2015-05-08 DIAGNOSIS — R079 Chest pain, unspecified: Secondary | ICD-10-CM | POA: Diagnosis not present

## 2015-05-08 DIAGNOSIS — R1084 Generalized abdominal pain: Secondary | ICD-10-CM

## 2015-05-08 DIAGNOSIS — K219 Gastro-esophageal reflux disease without esophagitis: Secondary | ICD-10-CM | POA: Diagnosis not present

## 2015-05-18 ENCOUNTER — Other Ambulatory Visit: Payer: Self-pay | Admitting: Gastroenterology

## 2015-05-18 DIAGNOSIS — M7989 Other specified soft tissue disorders: Secondary | ICD-10-CM | POA: Diagnosis not present

## 2015-05-18 DIAGNOSIS — R7309 Other abnormal glucose: Secondary | ICD-10-CM | POA: Diagnosis not present

## 2015-05-18 DIAGNOSIS — K317 Polyp of stomach and duodenum: Secondary | ICD-10-CM | POA: Diagnosis not present

## 2015-05-18 DIAGNOSIS — R0789 Other chest pain: Secondary | ICD-10-CM | POA: Diagnosis not present

## 2015-05-19 ENCOUNTER — Ambulatory Visit
Admission: RE | Admit: 2015-05-19 | Discharge: 2015-05-19 | Disposition: A | Discharge status: Home / Self Care | Payer: Medicare Other | Source: Ambulatory Visit | Attending: Gastroenterology | Admitting: Gastroenterology

## 2015-05-19 ENCOUNTER — Inpatient Hospital Stay: Admission: RE | Admit: 2015-05-19 | Payer: Medicare Other | Source: Ambulatory Visit

## 2015-05-19 DIAGNOSIS — R1084 Generalized abdominal pain: Secondary | ICD-10-CM

## 2015-05-19 DIAGNOSIS — R911 Solitary pulmonary nodule: Secondary | ICD-10-CM | POA: Diagnosis not present

## 2015-05-19 DIAGNOSIS — R935 Abnormal findings on diagnostic imaging of other abdominal regions, including retroperitoneum: Secondary | ICD-10-CM | POA: Diagnosis not present

## 2015-05-19 DIAGNOSIS — R079 Chest pain, unspecified: Secondary | ICD-10-CM

## 2015-05-19 MED ORDER — IOPAMIDOL (ISOVUE-300) INJECTION 61%
75.0000 mL | Freq: Once | INTRAVENOUS | Status: AC | PRN
  Administered 2015-05-19: 75 mL via INTRAVENOUS

## 2015-05-27 DIAGNOSIS — Z6827 Body mass index (BMI) 27.0-27.9, adult: Secondary | ICD-10-CM | POA: Diagnosis not present

## 2015-05-27 DIAGNOSIS — Z01419 Encounter for gynecological examination (general) (routine) without abnormal findings: Secondary | ICD-10-CM | POA: Diagnosis not present

## 2015-06-01 ENCOUNTER — Ambulatory Visit (INDEPENDENT_AMBULATORY_CARE_PROVIDER_SITE_OTHER): Payer: Medicare Other | Admitting: Internal Medicine

## 2015-06-01 ENCOUNTER — Encounter: Payer: Self-pay | Admitting: Internal Medicine

## 2015-06-01 VITALS — BP 112/64 | HR 65 | Ht 62.5 in | Wt 154.4 lb

## 2015-06-01 DIAGNOSIS — R0789 Other chest pain: Secondary | ICD-10-CM | POA: Diagnosis not present

## 2015-06-01 DIAGNOSIS — R911 Solitary pulmonary nodule: Secondary | ICD-10-CM

## 2015-06-01 DIAGNOSIS — I2510 Atherosclerotic heart disease of native coronary artery without angina pectoris: Secondary | ICD-10-CM | POA: Diagnosis not present

## 2015-06-01 LAB — NITRIC OXIDE: Nitric Oxide: 24

## 2015-06-01 NOTE — Progress Notes (Signed)
Subjective:    Patient ID: [name], female    DOB: 04/16/1945, 70 y.o.   MRN: [id number]  PCP Horatio Pel, MD   HPI  IOV 06/01/2015  Chief Complaint  Patient presents with  . Pulmonary Consult    Pt referred by Dr. Shelia Media for lung nodule. Pt states she has occassional SOB with activity, dry cough. Pt denies wheezing and CP/tightness.     70 year old female with a previous history of asthma and some family history of asthma. She reports that she has periodic chest spasms that get relieved with hot water. [facility] gastroenterologist Dr. Neila Gear is taking care of her. As part of this workup she had a CT chest 05/19/2015 which is then compared to a CT chest done in 2013. I was able to visualize the CT chest from February 2017 that showed scattered lung nodules. Per the report all these are stable compared to 2013. Except there is a new left lower lobe 7 mm lung nodule. She is a nonsmoker. She has a friend who died of lung cancer and was never smoker was therefore she is worried.  Otherwise she is fine. She has some esophageal studies coming up at [facility]. Exhaled nitric oxide today is 24 ppb and normal.     has a past medical history of MS (multiple sclerosis) ([place]); Esophageal spasm; Coronary artery disease; Complication of anesthesia; Anginal pain ([place]); GERD (gastroesophageal reflux disease); Arthritis; Anemia; and Dry eyes.   reports that she has never smoked. She has never used smokeless tobacco.  Past Surgical History  Procedure Laterality Date  . Transthoracic echocardiogram  07/02/11    LV CAVITY SIZE IS NORMAL. SYSTOLIC FUNCTION WAS NORMAL.EF=55% TO 60%.INCREASED RELATIVE CONTRIBUTION OF ATRIAL CONTRACTION TO VENTRICULAR FILLING;MAYBE DUE TO HYPOVOLEMIA. AV=MILD REGURG.  . Cardiology nuclear med study  06/22/12    NL LV FUNCTION,EF 68%,NL WALL MOTION.  . Cardiac catheterization  10/04/06    MINOR CAD,SINGLE VESSEL INVOLVING THE CIRCUMFLEX. 20 TO 30% PROXIMALLY  AND 10 TO 20% IN THE MIDDLE SEGMENT.MILD MUSCLE BRIDGING, MID LAD.NORMAL RCA.NORMAL LV FUNCTION.NORMAL MITRAL AND AORTIC VALVE.NORMAL APPEARING AORTA,THORACIC AND ABDOMINAL.NORMAL RENAL ARTERIES.  . Carotid duplex  07/02/11    [id number] SOFT PLAQUE NOTED DISTAL CCA AND ORGIN AND PROXIMAL ICA,LEFT>RIGHT.NO ICA STENOSIS. VERTEBRAL ARTERY FLOW IS ANTEGRADE.  [name] Parathyroidectomy      partial-many years ago  . Abdominal hysterectomy    . Cesarean section      x2   . Knee arthroscopy Left     scope  . Thumb surgery Bilateral     built up and bone removal  . Tonsillectomy    . Blepharoplasty Bilateral   . Colonoscopy with propofol N/A 12/11/2014    Procedure: COLONOSCOPY WITH PROPOFOL;  Surgeon: Ronald Lobo, MD;  Location: [facility];  Service: Endoscopy;  Laterality: N/A;    Allergies  Allergen Reactions  . Crestor [Rosuvastatin] Other (See Comments)    UNSPECIFIED   . Vicodin [Hydrocodone-Acetaminophen]   . Isovue [Iopamidol]     Pt had sneezing and itching of her throat and soft palate.  Dr Alvester Chou checked pt.  She will need premeds in the future.  J Bohm    Immunization History  Administered Date(s) Administered  . Influenza, High Dose Seasonal PF 12/22/2013  . Influenza,inj,Quad PF,36+ Mos 11/27/2014  . Pneumococcal-Unspecified 10/26/2013    Family History  Problem Relation Age of Onset  . Heart disease Mother   . Hyperlipidemia Brother   . Asthma Father   .  Bladder Cancer Father   . Heart failure Father      Current outpatient prescriptions:  .  amLODipine (NORVASC) 10 MG tablet, Take 1 tablet (10 mg total) by mouth daily., Disp: 30 tablet, Rfl: 2 .  aspirin EC 81 MG tablet, Take 1 tablet (81 mg total) by mouth daily., Disp: 90 tablet, Rfl: 3 .  carvedilol (COREG) 6.25 MG tablet, TAKE 1 TABLET TWICE DAILY WITH FOOD., Disp: 60 tablet, Rfl: 6 .  gabapentin (NEURONTIN) 300 MG capsule, Take 600 mg by mouth 4 (four) times daily as needed. , Disp: , Rfl:  .  isosorbide  mononitrate (IMDUR) 60 MG 24 hr tablet, TAKE 1 TABLET ONCE DAILY., Disp: 30 tablet, Rfl: 11 .  nitroGLYCERIN (NITROSTAT) 0.4 MG SL tablet, Place 1 tablet (0.4 mg total) under the tongue every 5 (five) minutes as needed for chest pain., Disp: 25 tablet, Rfl: 3 .  pantoprazole (PROTONIX) 40 MG tablet, Take 1 tablet (40 mg total) by mouth 2 (two) times daily., Disp: 60 tablet, Rfl: 2 .  PARoxetine (PAXIL) 10 MG tablet, Take 5 mg by mouth every morning. , Disp: , Rfl:  .  ranitidine (ZANTAC) 150 MG tablet, Take 1 tablet (150 mg total) by mouth at bedtime., Disp: , Rfl:  .  VYTORIN 10-20 MG per tablet, Take 1 tablet by mouth daily. , Disp: , Rfl:       Review of Systems  Constitutional: Negative for fever and unexpected weight change.  HENT: Negative for congestion, dental problem, ear pain, nosebleeds, postnasal drip, rhinorrhea, sinus pressure, sneezing, sore throat and trouble swallowing.   Eyes: Negative for redness and itching.  Respiratory: Positive for cough and shortness of breath. Negative for chest tightness and wheezing.   Cardiovascular: Negative for palpitations and leg swelling.  Gastrointestinal: Negative for nausea and vomiting.  Genitourinary: Negative for dysuria.  Musculoskeletal: Negative for joint swelling.  Skin: Negative for rash.  Neurological: Negative for headaches.  Hematological: Does not bruise/bleed easily.  Psychiatric/Behavioral: Negative for dysphoric mood. The patient is not nervous/anxious.        Objective:   Physical Exam  Constitutional: She is oriented to person, place, and time. She appears well-developed and well-nourished. No distress.  HENT:  Head: Normocephalic and atraumatic.  Right Ear: External ear normal.  Left Ear: External ear normal.  Mouth/Throat: Oropharynx is clear and moist. No oropharyngeal exudate.  Eyes: Conjunctivae and EOM are normal. Pupils are equal, round, and reactive to light. Right eye exhibits no discharge. Left eye  exhibits no discharge. No scleral icterus.  Neck: Normal range of motion. Neck supple. No JVD present. No tracheal deviation present. No thyromegaly present.  Cardiovascular: Normal rate, regular rhythm, normal heart sounds and intact distal pulses.  Exam reveals no gallop and no friction rub.   No murmur heard. Pulmonary/Chest: Effort normal and breath sounds normal. No respiratory distress. She has no wheezes. She has no rales. She exhibits no tenderness.  Abdominal: Soft. Bowel sounds are normal. She exhibits no distension and no mass. There is no tenderness. There is no rebound and no guarding.  Musculoskeletal: Normal range of motion. She exhibits no edema or tenderness.  Lymphadenopathy:    She has no cervical adenopathy.  Neurological: She is alert and oriented to person, place, and time. She has normal reflexes. No cranial nerve deficit. She exhibits normal muscle tone. Coordination normal.  Skin: Skin is warm and dry. No rash noted. She is not diaphoretic. No erythema. No pallor.  Psychiatric: She  has a normal mood and affect. Her behavior is normal. Judgment and thought content normal.  Vitals reviewed.   Filed Vitals:   06/01/15 1552  BP: 112/64  Pulse: 65  Height: 5' 2.5" (1.588 m)  Weight: 154 lb 6.4 oz (70.035 kg)  SpO2: 95%   Estimated body mass index is 27.77 kg/(m^2) as calculated from the following:   Height as of this encounter: 5' 2.5" (1.588 m).   Weight as of this encounter: 154 lb 6.4 oz (70.035 kg).       Assessment & Plan:     ICD-9-CM ICD-10-CM   1. Tightness in chest 786.59 R07.89   2. Nodule of left lung 793.11 R91.1    #Tightness of chest-this is not asthma based on history and normal exhaled nitric oxide.  #Nodule of left lung 7 mm lower lobe - very low probability this is lung cancer. We'll do a CT chest without contrast repeat in 9 months and see her again for follow-up.   Dr. Brand Males, M.D., [facility].C.P Pulmonary and Critical Care  Medicine Staff Physician [facility] Pager: [phone], If no answer or between  15:00h - 7:00h: call [phone]  06/01/2015 4:35 PM

## 2015-06-01 NOTE — Patient Instructions (Addendum)
ICD-9-CM ICD-10-CM   1. Tightness in chest 786.59 R07.89   2. Nodule of left lung 793.11 R91.1     Do feno test Next CT chest wo contrast in 9 months from end feb 2017  followup  - after chest ct in nov 2017

## 2015-06-04 ENCOUNTER — Other Ambulatory Visit: Payer: Self-pay | Admitting: Cardiovascular Disease

## 2015-06-04 NOTE — Telephone Encounter (Signed)
Rx(s) sent to pharmacy electronically.  

## 2015-06-05 DIAGNOSIS — M1811 Unilateral primary osteoarthritis of first carpometacarpal joint, right hand: Secondary | ICD-10-CM | POA: Diagnosis not present

## 2015-06-05 DIAGNOSIS — R52 Pain, unspecified: Secondary | ICD-10-CM | POA: Diagnosis not present

## 2015-06-25 ENCOUNTER — Institutional Professional Consult (permissible substitution): Payer: Medicare Other | Admitting: Internal Medicine

## 2015-07-01 DIAGNOSIS — L821 Other seborrheic keratosis: Secondary | ICD-10-CM | POA: Diagnosis not present

## 2015-07-01 DIAGNOSIS — D2239 Melanocytic nevi of other parts of face: Secondary | ICD-10-CM | POA: Diagnosis not present

## 2015-07-01 DIAGNOSIS — D1801 Hemangioma of skin and subcutaneous tissue: Secondary | ICD-10-CM | POA: Diagnosis not present

## 2015-07-01 DIAGNOSIS — L820 Inflamed seborrheic keratosis: Secondary | ICD-10-CM | POA: Diagnosis not present

## 2015-07-01 DIAGNOSIS — D485 Neoplasm of uncertain behavior of skin: Secondary | ICD-10-CM | POA: Diagnosis not present

## 2015-07-01 DIAGNOSIS — L918 Other hypertrophic disorders of the skin: Secondary | ICD-10-CM | POA: Diagnosis not present

## 2015-07-01 DIAGNOSIS — L814 Other melanin hyperpigmentation: Secondary | ICD-10-CM | POA: Diagnosis not present

## 2015-07-06 DIAGNOSIS — G35 Multiple sclerosis: Secondary | ICD-10-CM | POA: Diagnosis not present

## 2015-07-06 DIAGNOSIS — Z79899 Other long term (current) drug therapy: Secondary | ICD-10-CM | POA: Diagnosis not present

## 2015-07-14 ENCOUNTER — Other Ambulatory Visit: Payer: Self-pay | Admitting: Cardiovascular Disease

## 2015-07-14 NOTE — Telephone Encounter (Signed)
Rx request sent to pharmacy.  

## 2015-07-20 DIAGNOSIS — Z6827 Body mass index (BMI) 27.0-27.9, adult: Secondary | ICD-10-CM | POA: Diagnosis not present

## 2015-07-20 DIAGNOSIS — R079 Chest pain, unspecified: Secondary | ICD-10-CM | POA: Diagnosis not present

## 2015-07-20 DIAGNOSIS — R1310 Dysphagia, unspecified: Secondary | ICD-10-CM | POA: Diagnosis not present

## 2015-08-10 DIAGNOSIS — R079 Chest pain, unspecified: Secondary | ICD-10-CM | POA: Diagnosis not present

## 2015-08-18 DIAGNOSIS — G35 Multiple sclerosis: Secondary | ICD-10-CM | POA: Diagnosis not present

## 2015-08-21 DIAGNOSIS — I10 Essential (primary) hypertension: Secondary | ICD-10-CM | POA: Diagnosis not present

## 2015-08-21 DIAGNOSIS — D72829 Elevated white blood cell count, unspecified: Secondary | ICD-10-CM | POA: Diagnosis not present

## 2015-08-21 DIAGNOSIS — K219 Gastro-esophageal reflux disease without esophagitis: Secondary | ICD-10-CM | POA: Diagnosis not present

## 2015-08-25 DIAGNOSIS — G35 Multiple sclerosis: Secondary | ICD-10-CM | POA: Diagnosis not present

## 2015-08-25 DIAGNOSIS — Z79899 Other long term (current) drug therapy: Secondary | ICD-10-CM | POA: Diagnosis not present

## 2015-08-28 ENCOUNTER — Other Ambulatory Visit: Payer: Self-pay | Admitting: Internal Medicine

## 2015-08-28 DIAGNOSIS — M546 Pain in thoracic spine: Secondary | ICD-10-CM | POA: Diagnosis not present

## 2015-08-28 DIAGNOSIS — E041 Nontoxic single thyroid nodule: Secondary | ICD-10-CM

## 2015-08-28 DIAGNOSIS — M7989 Other specified soft tissue disorders: Secondary | ICD-10-CM | POA: Diagnosis not present

## 2015-08-28 DIAGNOSIS — R0789 Other chest pain: Secondary | ICD-10-CM | POA: Diagnosis not present

## 2015-08-28 DIAGNOSIS — G35 Multiple sclerosis: Secondary | ICD-10-CM | POA: Diagnosis not present

## 2015-09-15 ENCOUNTER — Ambulatory Visit
Admission: RE | Admit: 2015-09-15 | Discharge: 2015-09-15 | Disposition: A | Discharge status: Home / Self Care | Payer: Medicare Other | Source: Ambulatory Visit | Attending: Internal Medicine | Admitting: Internal Medicine

## 2015-09-15 DIAGNOSIS — E042 Nontoxic multinodular goiter: Secondary | ICD-10-CM | POA: Diagnosis not present

## 2015-09-15 DIAGNOSIS — E041 Nontoxic single thyroid nodule: Secondary | ICD-10-CM

## 2015-09-21 DIAGNOSIS — G35 Multiple sclerosis: Secondary | ICD-10-CM | POA: Diagnosis not present

## 2015-09-23 DIAGNOSIS — M1811 Unilateral primary osteoarthritis of first carpometacarpal joint, right hand: Secondary | ICD-10-CM | POA: Diagnosis not present

## 2015-09-30 DIAGNOSIS — M79644 Pain in right finger(s): Secondary | ICD-10-CM | POA: Diagnosis not present

## 2015-10-05 DIAGNOSIS — E2839 Other primary ovarian failure: Secondary | ICD-10-CM | POA: Diagnosis not present

## 2015-10-09 DIAGNOSIS — R7301 Impaired fasting glucose: Secondary | ICD-10-CM | POA: Diagnosis not present

## 2015-10-09 DIAGNOSIS — E041 Nontoxic single thyroid nodule: Secondary | ICD-10-CM | POA: Diagnosis not present

## 2015-10-09 DIAGNOSIS — I10 Essential (primary) hypertension: Secondary | ICD-10-CM | POA: Diagnosis not present

## 2015-10-12 ENCOUNTER — Other Ambulatory Visit: Payer: Self-pay | Admitting: Internal Medicine

## 2015-10-12 DIAGNOSIS — K219 Gastro-esophageal reflux disease without esophagitis: Secondary | ICD-10-CM | POA: Diagnosis not present

## 2015-10-12 DIAGNOSIS — Z6827 Body mass index (BMI) 27.0-27.9, adult: Secondary | ICD-10-CM | POA: Diagnosis not present

## 2015-10-12 DIAGNOSIS — E042 Nontoxic multinodular goiter: Secondary | ICD-10-CM

## 2015-10-23 DIAGNOSIS — K625 Hemorrhage of anus and rectum: Secondary | ICD-10-CM | POA: Diagnosis not present

## 2015-10-26 DIAGNOSIS — K559 Vascular disorder of intestine, unspecified: Secondary | ICD-10-CM | POA: Diagnosis not present

## 2015-11-04 DIAGNOSIS — M180 Bilateral primary osteoarthritis of first carpometacarpal joints: Secondary | ICD-10-CM | POA: Diagnosis not present

## 2015-11-17 DIAGNOSIS — K219 Gastro-esophageal reflux disease without esophagitis: Secondary | ICD-10-CM | POA: Diagnosis not present

## 2015-11-17 DIAGNOSIS — K222 Esophageal obstruction: Secondary | ICD-10-CM | POA: Diagnosis not present

## 2015-11-17 DIAGNOSIS — K228 Other specified diseases of esophagus: Secondary | ICD-10-CM | POA: Diagnosis not present

## 2015-11-17 DIAGNOSIS — R079 Chest pain, unspecified: Secondary | ICD-10-CM | POA: Diagnosis not present

## 2015-11-24 ENCOUNTER — Other Ambulatory Visit: Payer: Self-pay

## 2015-11-26 ENCOUNTER — Ambulatory Visit
Admission: RE | Admit: 2015-11-26 | Discharge: 2015-11-26 | Disposition: A | Discharge status: Home / Self Care | Payer: Medicare Other | Source: Ambulatory Visit | Attending: Internal Medicine | Admitting: Internal Medicine

## 2015-11-26 ENCOUNTER — Other Ambulatory Visit (HOSPITAL_COMMUNITY)
Admission: RE | Admit: 2015-11-26 | Discharge: 2015-11-26 | Disposition: A | Discharge status: Home / Self Care | Payer: Medicare Other | Source: Ambulatory Visit | Attending: Radiology | Admitting: Radiology

## 2015-11-26 DIAGNOSIS — E041 Nontoxic single thyroid nodule: Secondary | ICD-10-CM | POA: Diagnosis not present

## 2015-11-26 DIAGNOSIS — E042 Nontoxic multinodular goiter: Secondary | ICD-10-CM

## 2015-12-02 DIAGNOSIS — Z23 Encounter for immunization: Secondary | ICD-10-CM | POA: Diagnosis not present

## 2015-12-02 DIAGNOSIS — G35 Multiple sclerosis: Secondary | ICD-10-CM | POA: Diagnosis not present

## 2015-12-02 DIAGNOSIS — D72819 Decreased white blood cell count, unspecified: Secondary | ICD-10-CM | POA: Diagnosis not present

## 2015-12-02 DIAGNOSIS — Z79899 Other long term (current) drug therapy: Secondary | ICD-10-CM | POA: Diagnosis not present

## 2015-12-28 DIAGNOSIS — R152 Fecal urgency: Secondary | ICD-10-CM | POA: Diagnosis not present

## 2015-12-28 DIAGNOSIS — R0789 Other chest pain: Secondary | ICD-10-CM | POA: Diagnosis not present

## 2015-12-28 DIAGNOSIS — Z6826 Body mass index (BMI) 26.0-26.9, adult: Secondary | ICD-10-CM | POA: Diagnosis not present

## 2015-12-31 DIAGNOSIS — S161XXA Strain of muscle, fascia and tendon at neck level, initial encounter: Secondary | ICD-10-CM | POA: Diagnosis not present

## 2016-01-11 ENCOUNTER — Ambulatory Visit (INDEPENDENT_AMBULATORY_CARE_PROVIDER_SITE_OTHER): Payer: Medicare Other | Admitting: Cardiovascular Disease

## 2016-01-11 ENCOUNTER — Encounter: Payer: Self-pay | Admitting: Cardiovascular Disease

## 2016-01-11 VITALS — BP 122/66 | HR 62 | Ht 63.0 in | Wt 148.2 lb

## 2016-01-11 DIAGNOSIS — I2510 Atherosclerotic heart disease of native coronary artery without angina pectoris: Secondary | ICD-10-CM

## 2016-01-11 DIAGNOSIS — I10 Essential (primary) hypertension: Secondary | ICD-10-CM | POA: Diagnosis not present

## 2016-01-11 DIAGNOSIS — K224 Dyskinesia of esophagus: Secondary | ICD-10-CM

## 2016-01-11 DIAGNOSIS — R0789 Other chest pain: Secondary | ICD-10-CM | POA: Diagnosis not present

## 2016-01-11 DIAGNOSIS — G35 Multiple sclerosis: Secondary | ICD-10-CM

## 2016-01-11 DIAGNOSIS — K220 Achalasia of cardia: Secondary | ICD-10-CM

## 2016-01-11 DIAGNOSIS — E785 Hyperlipidemia, unspecified: Secondary | ICD-10-CM

## 2016-01-11 NOTE — Patient Instructions (Signed)
Your physician wants you to follow-up in: 1 year or sooner if needed. You will receive a reminder letter in the mail two months in advance. If you don't receive a letter, please call our office to schedule the follow-up appointment.   If you need a refill on your cardiac medications before your next appointment, please call your pharmacy.   

## 2016-01-11 NOTE — Progress Notes (Signed)
Patient ID: [name], female   DOB: 09/09/1945, 70 y.o.   MRN: [id number]    PCP: Dr. [name]  HPI: [name] is a 70 y.o. female who presents to the office today for a 10 month follow up cardiology evaluation.  [name] has documented mild CAD by  cardiac catheterization in July 2008 by Dr. Roe Rutherford.  She had mild narrowing of 20-30% in the proximal and 10-20% in the mid left circumflex vessel.  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  She has been on medical therapy.  She also has a history of hypertension,.  Her last stress test was done in March 2014 were she had nonspecific T changes and nondiagnostic 0.5-1 mm inferolateral ST segment changes with stress.  Scintigraphic images revealed normal perfusion and function.  She has a history of multiple sclerosis and is followed by Dr. Delphia Grates in [place].  She has a history of hyperlipidemia for which she takes Vytorin 10/20 and GERD for which he takes over-the-counter Prilosec.   She has experienced recent episodes of chest pain which have been occurring almost weekly. She experiences squeezing in her arms and jaw.  She denies chest pressure.  The symptoms are not associated with activity and typically resolve on her own.  She has taken nitroglycerin with questionable benefit.  She also notes calf discomfort at night while sleeping.  She denies restless legs.  Over the past year, she was started on a human monoclonal antibody ocrelizumab  for her multiple sclerosis and admits to improvement in symptomatology.  When I saw her last year she had experienced recurrent episodes of chest pain with some atypical features.  She underwent a nuclear perfusion study in December 2016 which revealed normal perfusion with an ejection fraction of 66%.  She sexually develop recurrent chest pain in January and again was felt to be stable cardiovascularly.  She subsequently was evaluated at [facility] and was felt  that her chest discomfort was due to esophageal lower esophageal sphincter spasm and inability to relax appropriately.  Her symptoms have improved with Protonix and she is now being weaned off Protonix and has been started on Zantac.  She has lost 5 pounds over the past 3 months.  She will be having follow-up blood work by her primary physician in the very near future.  She denies recurrent chest pain symptoms.  She denies palpitations.  She presents for reevaluation.  Past Medical History:  Diagnosis Date  . Anemia    past history-many yrs ago  . Anginal pain ([place])    being evaluated by Dr. Tyrone Sage, arm pain,"bad indigestion" -no heart related findings as of yet  . Arthritis    hip. back pain  . Complication of anesthesia    s/p Hysterectomy "vagal response "heart stopped" -did not require shocking.  . Coronary artery disease   . Dry eyes   . Esophageal spasm   . GERD (gastroesophageal reflux disease)   . MS (multiple sclerosis) ([place])    stable-sees [name] every 6 months    Past Surgical History:  Procedure Laterality Date  . ABDOMINAL HYSTERECTOMY    . BLEPHAROPLASTY Bilateral   . CARDIAC CATHETERIZATION  10/04/06   MINOR CAD,SINGLE VESSEL INVOLVING THE CIRCUMFLEX. 20 TO 30% PROXIMALLY AND 10 TO 20% IN THE MIDDLE SEGMENT.MILD MUSCLE BRIDGING, MID LAD.NORMAL RCA.NORMAL LV FUNCTION.NORMAL MITRAL AND AORTIC VALVE.NORMAL APPEARING AORTA,THORACIC AND ABDOMINAL.NORMAL RENAL ARTERIES.  [name] CARDIOLOGY NUCLEAR MED STUDY  06/22/12  NL LV FUNCTION,EF 68%,NL WALL MOTION.  [name] CAROTID DUPLEX  07/02/11   HYQ:[id number] SOFT PLAQUE NOTED DISTAL CCA AND ORGIN AND PROXIMAL ICA,LEFT>RIGHT.NO ICA STENOSIS. VERTEBRAL ARTERY FLOW IS ANTEGRADE.  [name] CESAREAN SECTION     x2   . COLONOSCOPY WITH PROPOFOL N/A 12/11/2014   Procedure: COLONOSCOPY WITH PROPOFOL;  Surgeon: Ronald Lobo, MD;  Location: [facility];  Service: Endoscopy;  Laterality: N/A;  . KNEE ARTHROSCOPY Left    scope  . PARATHYROIDECTOMY      partial-many years ago  . thumb surgery Bilateral    built up and bone removal  . TONSILLECTOMY    . TRANSTHORACIC ECHOCARDIOGRAM  07/02/11   LV CAVITY SIZE IS NORMAL. SYSTOLIC FUNCTION WAS NORMAL.EF=55% TO 60%.INCREASED RELATIVE CONTRIBUTION OF ATRIAL CONTRACTION TO VENTRICULAR FILLING;MAYBE DUE TO HYPOVOLEMIA. AV=MILD REGURG.    Allergies  Allergen Reactions  . Crestor [Rosuvastatin] Other (See Comments)    UNSPECIFIED   . Vicodin [Hydrocodone-Acetaminophen]   . Isovue [Iopamidol]     Pt had sneezing and itching of her throat and soft palate.  Dr Alvester Chou checked pt.  She will need premeds in the future.  Alfonse Alpers    Current Outpatient Prescriptions  Medication Sig Dispense Refill  . amLODipine (NORVASC) 5 MG tablet Take 1 tablet (5 mg total) by mouth daily. 30 tablet 10  . aspirin EC 81 MG tablet Take 1 tablet (81 mg total) by mouth daily. 90 tablet 3  . carvedilol (COREG) 6.25 MG tablet TAKE 1 TABLET TWICE DAILY WITH FOOD. 60 tablet 10  . dicyclomine (BENTYL) 10 MG capsule Take 10 mg by mouth daily as needed.    . gabapentin (NEURONTIN) 300 MG capsule Take 600 mg by mouth 4 (four) times daily as needed.     . isosorbide mononitrate (IMDUR) 60 MG 24 hr tablet TAKE 1 TABLET ONCE DAILY. 30 tablet 11  . meloxicam (MOBIC) 7.5 MG tablet Take 1 tablet by mouth daily. For 2 weeks    . nitroGLYCERIN (NITROSTAT) 0.4 MG SL tablet Place 1 tablet (0.4 mg total) under the tongue every 5 (five) minutes as needed for chest pain. 25 tablet 3  . ocrelizumab (OCREVUS) 300 MG/10ML injection Inject 600 mg into the vein every 6 (six) months.    . pantoprazole (PROTONIX) 20 MG tablet Take 20 mg by mouth 2 (two) times daily.    [name] PARoxetine (PAXIL) 10 MG tablet Take 5 mg by mouth every morning.     . ranitidine (ZANTAC) 150 MG tablet Take 1 tablet (150 mg total) by mouth at bedtime. (Patient taking differently: Take 150 mg by mouth as needed. )    . VYTORIN 10-20 MG per tablet Take 1 tablet by mouth daily.        No current facility-administered medications for this visit.     Social History   Social History  . Marital status: Divorced    Spouse name: N/A  . Number of children: N/A  . Years of education: N/A   Occupational History  . retired    Social History Main Topics  . Smoking status: Never Smoker  . Smokeless tobacco: Never Used  . Alcohol use 0.0 oz/week     Comment: wine occ.  . Drug use: No  . Sexual activity: Not on file   Other Topics Concern  . Not on file   Social History Narrative  . No narrative on file    Family History  Problem Relation Age of Onset  . Heart disease Mother   .  Hyperlipidemia Brother   . Asthma Father   . Bladder Cancer Father   . Heart failure Father     ROS General: Negative; No fevers, chills, or night sweats HEENT: Negative; No changes in vision or hearing, sinus congestion, difficulty swallowing Pulmonary: Negative; No cough, wheezing, shortness of breath, hemoptysis Cardiovascular: See history of present illness; No presyncope, syncope, palpatations GI: Positive for esophageal lower esophageal sphincter spasm GU: Negative; No dysuria, hematuria, or difficulty voiding Musculoskeletal: Negative; no myalgias, joint pain, or weakness Hematologic: Negative; no easy bruising, bleeding Endocrine: Negative; no heat/cold intolerance; no diabetes, Neuro: Positive for multiple sclerosis, currently on no therapy, no changes in balance, headaches Skin: Negative; No rashes or skin lesions Psychiatric: Negative; No behavioral problems, depression Sleep: Negative; No snoring,  daytime sleepiness, hypersomnolence, bruxism, restless legs, hypnogognic hallucinations. Other comprehensive 14 point system review is negative    Physical Exam BP 122/66 (BP Location: Left Arm, Patient Position: Sitting, Cuff Size: Normal)   Pulse 62   Ht 5' 3"  (1.6 m)   Wt 148 lb 3.2 oz (67.2 kg)   BMI 26.25 kg/m    Repeat blood pressure by me 124/62  Wt  Readings from Last 3 Encounters:  01/11/16 148 lb 3.2 oz (67.2 kg)  06/01/15 154 lb 6.4 oz (70 kg)  04/26/15 152 lb 12.5 oz (69.3 kg)   General: Alert, oriented, no distress.  Skin: normal turgor, no rashes, warm and dry HEENT: Normocephalic, atraumatic. Pupils equal round and reactive to light; sclera anicteric; extraocular muscles intact, No lid lag; Nose without nasal septal hypertrophy; Mouth/Parynx benign; Mallinpatti scale 2 Neck: No JVD, no carotid bruits; normal carotid upstroke Lungs: clear to ausculatation and percussion bilaterally; no wheezing or rales, normal inspiratory and expiratory effort Chest wall: without tenderness to palpitation Heart: PMI not displaced, RRR, s1 s2 normal, 1/6 systolic murmur, No diastolic murmur, no rubs, gallops, thrills, or heaves Abdomen: soft, nontender; no hepatosplenomehaly, BS+; abdominal aorta nontender and not dilated by palpation. Back: no CVA tenderness Pulses: 2+  Musculoskeletal: full range of motion, normal strength, no joint deformities Extremities: Pulses 2+, no clubbing cyanosis or edema, Homan's sign negative  Neurologic: grossly nonfocal; Cranial nerves grossly wnl Psychologic: Normal mood and affect  ECG (independently read by me): Normal sinus rhythm at 62 bpm.  No ectopy.  Nonspecific T-wave changes.  December 2016 ECG (independently read by me):  Sinus bradycardia 58 bpm.  Nonspecific T changes.  ECG (independently read by me): Normal sinus rhythm at 62 bpm.  Normal intervals.  QTc interval 440 ms.  No significant ST segment changes.  June 2015 ECG (independently read by me): Sinus rhythm at 56 beats per minute.  Nonspecific ST changes.  LABS:  BMP Latest Ref Rng & Units 04/26/2015 04/25/2015 07/03/2011  Glucose 65 - 99 mg/dL 148(H) 134(H) 103(H)  BUN 6 - 20 mg/dL 16 17 9   Creatinine 0.44 - 1.00 mg/dL 0.73 0.80 0.63  Sodium 135 - 145 mmol/L 141 140 140  Potassium 3.5 - 5.1 mmol/L 3.8 4.1 3.2(L)  Chloride 101 - 111 mmol/L  106 106 113(H)  CO2 22 - 32 mmol/L 27 20(L) 20  Calcium 8.9 - 10.3 mg/dL 8.9 9.2 8.0(L)   Hepatic Function Latest Ref Rng & Units 05/10/2009  Total Protein 6.0 - 8.3 g/dL 5.9(L)  Albumin 3.5 - 5.2 g/dL 3.4(L)  AST 0 - 37 U/L 19  ALT 0 - 35 U/L 22  Alk Phosphatase 39 - 117 U/L 70  Total Bilirubin 0.3 - 1.2  mg/dL 0.4   CBC Latest Ref Rng & Units 04/26/2015 04/25/2015 07/03/2011  WBC 4.0 - 10.5 K/uL 14.9(H) 19.0(H) 4.3  Hemoglobin 12.0 - 15.0 g/dL 13.1 13.5 11.7(L)  Hematocrit 36.0 - 46.0 % 39.5 40.3 35.3(L)  Platelets 150 - 400 K/uL 203 229 144(L)   Lab Results  Component Value Date   MCV 91.9 04/26/2015   MCV 91.6 04/25/2015   MCV 91.0 07/03/2011    Lab Results  Component Value Date   TSH 0.735 07/02/2011   No results found for: HGBA1C   Lipid Panel  No results found for: CHOL, TRIG, HDL, CHOLHDL, VLDL, LDLCALC, LDLDIRECT   RADIOLOGY: No results found.    ASSESSMENT AND PLAN: Ms. Kaylynne Andres is a 70 year-old female with previously documented mild CAD involving her left circumflex coronary artery in addition to systolic bridging of the mid LAD noted at cardiac catheterization in February 2008. A nuclear perfusion study in March 2014 showed normal perfusion.She developed recurrent chest pain symptomatology in December 2016 and repeat nuclear perfusion study remained entirely normal.  She had an episode of recurrent chest pain in January.  She ultimately was referred to [facility] and was felt to have esophageal etiology to her chest pain contributed by spasm of her lower esophageal sphincter.  Her symptoms have improved and she now is slowly being weaned off Protonix and being replaced by Zantac.    Her blood pressure today is controlled on her current medical regimen.  With her amlodipine and nitrate therapy she also is on medication for potential spasm of her coronary arteries and/or esophagus.  She is now on ocrevus for her multiple sclerosis, which is a human monoclonal  antibody and she believes that her symptoms have significantly improved.  She will be having follow-up evaluation with Dr. Malen Gauze at [facility] if she is in need for esophageal dilatation.  From a cardiac standpoint, I will see her in one year for reevaluation. Time spent: 25 minutes  Troy Sine, MD, [facility]  01/11/2016 6:08 PM

## 2016-02-01 ENCOUNTER — Ambulatory Visit (INDEPENDENT_AMBULATORY_CARE_PROVIDER_SITE_OTHER)
Admission: RE | Admit: 2016-02-01 | Discharge: 2016-02-01 | Disposition: A | Discharge status: Home / Self Care | Payer: Medicare Other | Source: Ambulatory Visit | Attending: Internal Medicine | Admitting: Internal Medicine

## 2016-02-01 DIAGNOSIS — R0789 Other chest pain: Secondary | ICD-10-CM

## 2016-02-01 DIAGNOSIS — R911 Solitary pulmonary nodule: Secondary | ICD-10-CM

## 2016-02-01 DIAGNOSIS — R918 Other nonspecific abnormal finding of lung field: Secondary | ICD-10-CM | POA: Diagnosis not present

## 2016-02-03 ENCOUNTER — Ambulatory Visit: Payer: Medicare Other | Admitting: Internal Medicine

## 2016-02-03 ENCOUNTER — Other Ambulatory Visit: Payer: Self-pay | Admitting: Obstetrics and Gynecology

## 2016-02-03 DIAGNOSIS — Z1231 Encounter for screening mammogram for malignant neoplasm of breast: Secondary | ICD-10-CM

## 2016-02-15 DIAGNOSIS — G35 Multiple sclerosis: Secondary | ICD-10-CM | POA: Diagnosis not present

## 2016-02-16 NOTE — Progress Notes (Signed)
Called and spoke pt. Informed her of the result and recs per MR. Pt verbalized understanding and denied any further questions or concerns at this time.

## 2016-02-23 DIAGNOSIS — M9901 Segmental and somatic dysfunction of cervical region: Secondary | ICD-10-CM | POA: Diagnosis not present

## 2016-02-23 DIAGNOSIS — M5031 Other cervical disc degeneration,  high cervical region: Secondary | ICD-10-CM | POA: Diagnosis not present

## 2016-02-25 ENCOUNTER — Encounter: Payer: Self-pay | Admitting: Internal Medicine

## 2016-02-25 ENCOUNTER — Ambulatory Visit (INDEPENDENT_AMBULATORY_CARE_PROVIDER_SITE_OTHER): Payer: Medicare Other | Admitting: Internal Medicine

## 2016-02-25 VITALS — BP 126/68 | HR 63 | Ht 63.0 in | Wt 152.4 lb

## 2016-02-25 DIAGNOSIS — I2510 Atherosclerotic heart disease of native coronary artery without angina pectoris: Secondary | ICD-10-CM | POA: Diagnosis not present

## 2016-02-25 DIAGNOSIS — R911 Solitary pulmonary nodule: Secondary | ICD-10-CM

## 2016-02-25 DIAGNOSIS — R0789 Other chest pain: Secondary | ICD-10-CM | POA: Diagnosis not present

## 2016-02-25 DIAGNOSIS — M9901 Segmental and somatic dysfunction of cervical region: Secondary | ICD-10-CM | POA: Diagnosis not present

## 2016-02-25 DIAGNOSIS — M5031 Other cervical disc degeneration,  high cervical region: Secondary | ICD-10-CM | POA: Diagnosis not present

## 2016-02-25 NOTE — Progress Notes (Signed)
Subjective:     Patient ID: [name], female   DOB: March 17, 1946, 70 y.o.   MRN: [id number]  HPI   PCP Horatio Pel, MD   HPI  IOV 06/01/2015  Chief Complaint  Patient presents with  . Pulmonary Consult    Pt referred by Dr. Shelia Media for lung nodule. Pt states she has occassional SOB with activity, dry cough. Pt denies wheezing and CP/tightness.     70 year old female with a previous history of asthma and some family history of asthma. She reports that she has periodic chest spasms that get relieved with hot water. [facility] gastroenterologist Dr. Neila Gear is taking care of her. As part of this workup she had a CT chest 05/19/2015 which is then compared to a CT chest done in 2013. I was able to visualize the CT chest from February 2017 that showed scattered lung nodules. Per the report all these are stable compared to 2013. Except there is a new left lower lobe 7 mm lung nodule. She is a nonsmoker. She has a friend who died of lung cancer and was never smoker was therefore she is worried.  Otherwise she is fine. She has some esophageal studies coming up at [facility]. Exhaled nitric oxide today is 24 ppb and normal.   OV 02/25/2016  Chief Complaint  Patient presents with  . Follow-up    CT results. pt states breathing is doing well. no new conerns today.    70 year old female follows up for chest tightness not otherwise specified and new left lower lobe 7 mm lung nodule from Spring 2017  The chest tightness is resolved. This was with the help of GI services and endoscopy. She not having any shortness of breath, wheezing and chest tightness or wheezing or edema or paroxysmal nocturnal dyspnea or orthopnea or fever or chills. She feels great  In terms of the lung nodule left lower lobe 7 mm this is resolved. She thinks this was as a result of cough from a year ago. She did have some coronary artery calcification but a year ago she had cardiac stress test that was normal.  Fatty liver as noted on the CT scan and she's aware of visceral obesity and she will talk about this with her primary care physician Horatio Pel, MD   IMPRESSION: 1. The 7 mm left lower lobe pulmonary nodule described on the prior exam has resolved, with only scarring remaining in this area. 2.  No acute process in the chest. 3. Improved upper lobe tree-in-bud nodularity, consistent with improving infectious bronchiolitis. 4. Bibasilar pulmonary nodules are similar and can be presumed benign. 5. Hepatic steatosis. 6.  Coronary artery atherosclerosis. Aortic atherosclerosis.   Electronically Signed   By: Abigail Miyamoto M.D.   On: 02/01/2016 17:00    has a past medical history of Anemia; Anginal pain ([place]); Arthritis; Complication of anesthesia; Coronary artery disease; Dry eyes; Esophageal spasm; GERD (gastroesophageal reflux disease); and MS (multiple sclerosis) ([place]).   reports that she has never smoked. She has never used smokeless tobacco.  Past Surgical History:  Procedure Laterality Date  . ABDOMINAL HYSTERECTOMY    . BLEPHAROPLASTY Bilateral   . CARDIAC CATHETERIZATION  10/04/06   MINOR CAD,SINGLE VESSEL INVOLVING THE CIRCUMFLEX. 20 TO 30% PROXIMALLY AND 10 TO 20% IN THE MIDDLE SEGMENT.MILD MUSCLE BRIDGING, MID LAD.NORMAL RCA.NORMAL LV FUNCTION.NORMAL MITRAL AND AORTIC VALVE.NORMAL APPEARING AORTA,THORACIC AND ABDOMINAL.NORMAL RENAL ARTERIES.  [name] CARDIOLOGY NUCLEAR MED STUDY  06/22/12   NL LV FUNCTION,EF 68%,NL WALL  MOTION.  [name] CAROTID DUPLEX  07/02/11   [id number] SOFT PLAQUE NOTED DISTAL CCA AND ORGIN AND PROXIMAL ICA,LEFT>RIGHT.NO ICA STENOSIS. VERTEBRAL ARTERY FLOW IS ANTEGRADE.  [name] CESAREAN SECTION     x2   . COLONOSCOPY WITH PROPOFOL N/A 12/11/2014   Procedure: COLONOSCOPY WITH PROPOFOL;  Surgeon: Ronald Lobo, MD;  Location: [facility];  Service: Endoscopy;  Laterality: N/A;  . KNEE ARTHROSCOPY Left    scope  . PARATHYROIDECTOMY     partial-many years ago  .  thumb surgery Bilateral    built up and bone removal  . TONSILLECTOMY    . TRANSTHORACIC ECHOCARDIOGRAM  07/02/11   LV CAVITY SIZE IS NORMAL. SYSTOLIC FUNCTION WAS NORMAL.EF=55% TO 60%.INCREASED RELATIVE CONTRIBUTION OF ATRIAL CONTRACTION TO VENTRICULAR FILLING;MAYBE DUE TO HYPOVOLEMIA. AV=MILD REGURG.    Allergies  Allergen Reactions  . Crestor [Rosuvastatin] Other (See Comments)    UNSPECIFIED   . Vicodin [Hydrocodone-Acetaminophen]   . Isovue [Iopamidol]     Pt had sneezing and itching of her throat and soft palate.  Dr Alvester Chou checked pt.  She will need premeds in the future.  J Bohm    Immunization History  Administered Date(s) Administered  . Influenza, High Dose Seasonal PF 12/22/2013, 12/14/2015  . Influenza,inj,Quad PF,36+ Mos 11/27/2014  . Pneumococcal-Unspecified 10/26/2013    Family History  Problem Relation Age of Onset  . Heart disease Mother   . Hyperlipidemia Brother   . Asthma Father   . Bladder Cancer Father   . Heart failure Father      Current Outpatient Prescriptions:  .  amLODipine (NORVASC) 5 MG tablet, Take 1 tablet (5 mg total) by mouth daily., Disp: 30 tablet, Rfl: 10 .  aspirin EC 81 MG tablet, Take 1 tablet (81 mg total) by mouth daily., Disp: 90 tablet, Rfl: 3 .  carvedilol (COREG) 6.25 MG tablet, TAKE 1 TABLET TWICE DAILY WITH FOOD., Disp: 60 tablet, Rfl: 10 .  gabapentin (NEURONTIN) 300 MG capsule, Take 600 mg by mouth 4 (four) times daily as needed. , Disp: , Rfl:  .  isosorbide mononitrate (IMDUR) 60 MG 24 hr tablet, TAKE 1 TABLET ONCE DAILY., Disp: 30 tablet, Rfl: 11 .  meloxicam (MOBIC) 7.5 MG tablet, Take 1 tablet by mouth daily. For 2 weeks, Disp: , Rfl:  .  nitroGLYCERIN (NITROSTAT) 0.4 MG SL tablet, Place 1 tablet (0.4 mg total) under the tongue every 5 (five) minutes as needed for chest pain., Disp: 25 tablet, Rfl: 3 .  ocrelizumab (OCREVUS) 300 MG/10ML injection, Inject 600 mg into the vein every 6 (six) months., Disp: , Rfl:  .   pantoprazole (PROTONIX) 20 MG tablet, Take 20 mg by mouth 2 (two) times daily., Disp: , Rfl:  .  PARoxetine (PAXIL) 10 MG tablet, Take 5 mg by mouth every morning. , Disp: , Rfl:  .  ranitidine (ZANTAC) 150 MG tablet, Take 1 tablet (150 mg total) by mouth at bedtime. (Patient taking differently: Take 150 mg by mouth as needed. ), Disp: , Rfl:  .  VYTORIN 10-20 MG per tablet, Take 1 tablet by mouth daily. , Disp: , Rfl:  .  dicyclomine (BENTYL) 10 MG capsule, Take 10 mg by mouth daily as needed., Disp: , Rfl:  .  predniSONE (DELTASONE) 20 MG tablet, , Disp: , Rfl:     Review of Systems     Objective:   Physical Exam  Constitutional: She is oriented to person, place, and time. She appears well-developed and well-nourished. No distress.  HENT:  Head: Normocephalic and atraumatic.  Right Ear: External ear normal.  Left Ear: External ear normal.  Mouth/Throat: Oropharynx is clear and moist. No oropharyngeal exudate.  Eyes: Conjunctivae and EOM are normal. Pupils are equal, round, and reactive to light. Right eye exhibits no discharge. Left eye exhibits no discharge. No scleral icterus.  Neck: Normal range of motion. Neck supple. No JVD present. No tracheal deviation present. No thyromegaly present.  Cardiovascular: Normal rate, regular rhythm, normal heart sounds and intact distal pulses.  Exam reveals no gallop and no friction rub.   No murmur heard. Pulmonary/Chest: Effort normal and breath sounds normal. No respiratory distress. She has no wheezes. She has no rales. She exhibits no tenderness.  Abdominal: Soft. Bowel sounds are normal. She exhibits no distension and no mass. There is no tenderness. There is no rebound and no guarding.  Musculoskeletal: Normal range of motion. She exhibits no edema or tenderness.  Lymphadenopathy:    She has no cervical adenopathy.  Neurological: She is alert and oriented to person, place, and time. She has normal reflexes. No cranial nerve deficit. She  exhibits normal muscle tone. Coordination normal.  Skin: Skin is warm and dry. No rash noted. She is not diaphoretic. No erythema. No pallor.  Psychiatric: She has a normal mood and affect. Her behavior is normal. Judgment and thought content normal.  Vitals reviewed.   Vitals:   02/25/16 1559  BP: 126/68  Pulse: 63  SpO2: 98%  Weight: 152 lb 6.4 oz (69.1 kg)  Height: 5\' 3"  (1.6 m)   Estimated body mass index is 27 kg/m as calculated from the following:   Height as of this encounter: 5\' 3"  (1.6 m).   Weight as of this encounter: 152 lb 6.4 oz (69.1 kg).      Assessment:       ICD-9-CM ICD-10-CM   1. Nodule of left lung 793.11 R91.1   2. Tightness in chest 786.59 R07.89        Plan:      Glad all resolved No active followup Return anytime if needed - next 3 years you are considered established patient   Dr. Brand Males, M.D., [facility].C.P Pulmonary and Critical Care Medicine Staff Physician [facility] Pager: [phone], If no answer or between  15:00h - 7:00h: call [phone]  02/25/2016 4:31 PM

## 2016-02-25 NOTE — Patient Instructions (Signed)
ICD-9-CM ICD-10-CM   1. Nodule of left lung 793.11 R91.1   2. Tightness in chest 786.59 R07.89     Glad all resolved No active followup Return anytime if needed - next 3 years you are considered established patient

## 2016-02-26 DIAGNOSIS — M1811 Unilateral primary osteoarthritis of first carpometacarpal joint, right hand: Secondary | ICD-10-CM | POA: Diagnosis not present

## 2016-02-29 DIAGNOSIS — M5031 Other cervical disc degeneration,  high cervical region: Secondary | ICD-10-CM | POA: Diagnosis not present

## 2016-02-29 DIAGNOSIS — M9901 Segmental and somatic dysfunction of cervical region: Secondary | ICD-10-CM | POA: Diagnosis not present

## 2016-03-07 DIAGNOSIS — M9901 Segmental and somatic dysfunction of cervical region: Secondary | ICD-10-CM | POA: Diagnosis not present

## 2016-03-07 DIAGNOSIS — M5136 Other intervertebral disc degeneration, lumbar region: Secondary | ICD-10-CM | POA: Diagnosis not present

## 2016-03-07 DIAGNOSIS — M9903 Segmental and somatic dysfunction of lumbar region: Secondary | ICD-10-CM | POA: Diagnosis not present

## 2016-03-07 DIAGNOSIS — M50321 Other cervical disc degeneration at C4-C5 level: Secondary | ICD-10-CM | POA: Diagnosis not present

## 2016-03-09 ENCOUNTER — Ambulatory Visit
Admission: RE | Admit: 2016-03-09 | Discharge: 2016-03-09 | Disposition: A | Discharge status: Home / Self Care | Payer: Medicare Other | Source: Ambulatory Visit | Attending: Obstetrics and Gynecology | Admitting: Obstetrics and Gynecology

## 2016-03-09 DIAGNOSIS — Z1231 Encounter for screening mammogram for malignant neoplasm of breast: Secondary | ICD-10-CM

## 2016-03-09 DIAGNOSIS — M5136 Other intervertebral disc degeneration, lumbar region: Secondary | ICD-10-CM | POA: Diagnosis not present

## 2016-03-09 DIAGNOSIS — M9903 Segmental and somatic dysfunction of lumbar region: Secondary | ICD-10-CM | POA: Diagnosis not present

## 2016-03-09 DIAGNOSIS — M9901 Segmental and somatic dysfunction of cervical region: Secondary | ICD-10-CM | POA: Diagnosis not present

## 2016-03-09 DIAGNOSIS — M50321 Other cervical disc degeneration at C4-C5 level: Secondary | ICD-10-CM | POA: Diagnosis not present

## 2016-03-10 DIAGNOSIS — M9903 Segmental and somatic dysfunction of lumbar region: Secondary | ICD-10-CM | POA: Diagnosis not present

## 2016-03-10 DIAGNOSIS — M50321 Other cervical disc degeneration at C4-C5 level: Secondary | ICD-10-CM | POA: Diagnosis not present

## 2016-03-10 DIAGNOSIS — M5136 Other intervertebral disc degeneration, lumbar region: Secondary | ICD-10-CM | POA: Diagnosis not present

## 2016-03-10 DIAGNOSIS — M9901 Segmental and somatic dysfunction of cervical region: Secondary | ICD-10-CM | POA: Diagnosis not present

## 2016-03-15 DIAGNOSIS — Z79899 Other long term (current) drug therapy: Secondary | ICD-10-CM | POA: Diagnosis not present

## 2016-03-15 DIAGNOSIS — G35 Multiple sclerosis: Secondary | ICD-10-CM | POA: Diagnosis not present

## 2016-03-15 DIAGNOSIS — D72819 Decreased white blood cell count, unspecified: Secondary | ICD-10-CM | POA: Diagnosis not present

## 2016-04-01 ENCOUNTER — Other Ambulatory Visit: Payer: Self-pay | Admitting: Psychiatry

## 2016-04-01 DIAGNOSIS — G35 Multiple sclerosis: Secondary | ICD-10-CM

## 2016-04-06 ENCOUNTER — Ambulatory Visit
Admission: RE | Admit: 2016-04-06 | Discharge: 2016-04-06 | Disposition: A | Discharge status: Home / Self Care | Payer: Medicare Other | Source: Ambulatory Visit | Attending: Psychiatry | Admitting: Psychiatry

## 2016-04-06 DIAGNOSIS — G35 Multiple sclerosis: Secondary | ICD-10-CM

## 2016-04-06 MED ORDER — GADOBENATE DIMEGLUMINE 529 MG/ML IV SOLN
13.0000 mL | Freq: Once | INTRAVENOUS | Status: AC | PRN
  Administered 2016-04-06: 13 mL via INTRAVENOUS

## 2016-04-18 ENCOUNTER — Other Ambulatory Visit: Payer: Self-pay | Admitting: Cardiovascular Disease

## 2016-05-09 DIAGNOSIS — R05 Cough: Secondary | ICD-10-CM | POA: Diagnosis not present

## 2016-05-13 ENCOUNTER — Other Ambulatory Visit: Payer: Self-pay | Admitting: Endocrinology

## 2016-05-13 DIAGNOSIS — E041 Nontoxic single thyroid nodule: Secondary | ICD-10-CM

## 2016-05-16 ENCOUNTER — Other Ambulatory Visit: Payer: Self-pay | Admitting: Cardiovascular Disease

## 2016-05-23 ENCOUNTER — Other Ambulatory Visit: Payer: Self-pay | Admitting: Cardiovascular Disease

## 2016-06-02 DIAGNOSIS — M542 Cervicalgia: Secondary | ICD-10-CM | POA: Diagnosis not present

## 2016-06-08 DIAGNOSIS — M542 Cervicalgia: Secondary | ICD-10-CM | POA: Diagnosis not present

## 2016-06-15 DIAGNOSIS — M542 Cervicalgia: Secondary | ICD-10-CM | POA: Diagnosis not present

## 2016-07-11 ENCOUNTER — Other Ambulatory Visit: Payer: Self-pay | Admitting: Cardiovascular Disease

## 2016-07-31 IMAGING — US US ABDOMEN COMPLETE
1 series · 13 of 25 positions shown · non-contrast
Comparison: Abdominal pelvic CT scan July 02, 2011.

CLINICAL DATA: Intermittent chest pain over the past 5 years

EXAM:
ABDOMEN ULTRASOUND COMPLETE

[Series 1: us abdomen complete · 0.26mm/px · 13 of 76 slices shown]
[im 1/76]
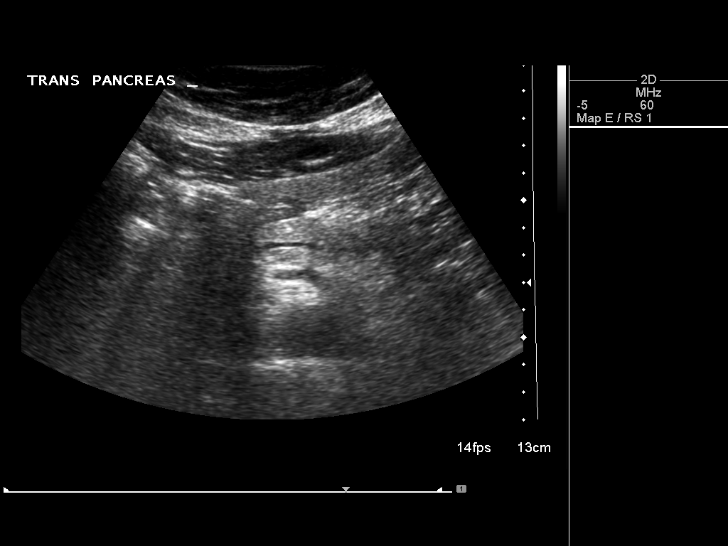
[im 7/76]
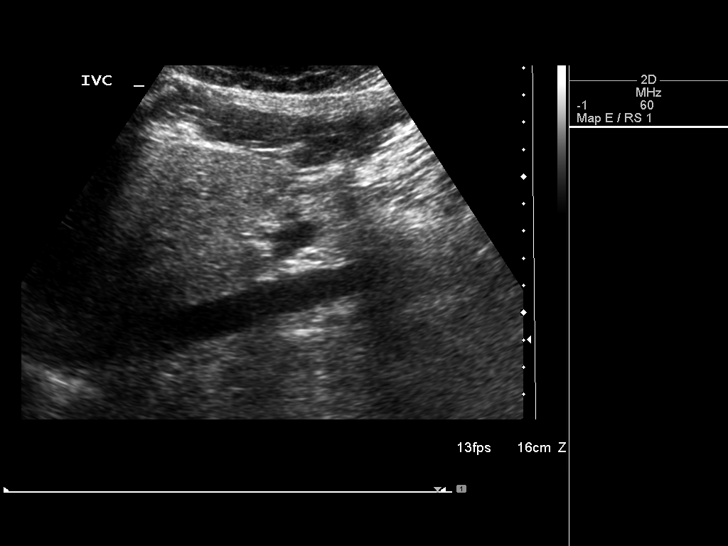
[im 13/76]
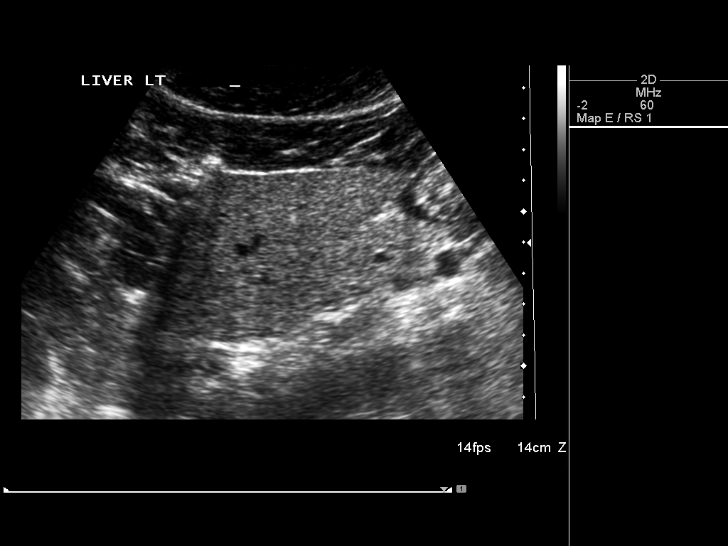
[im 19/76]
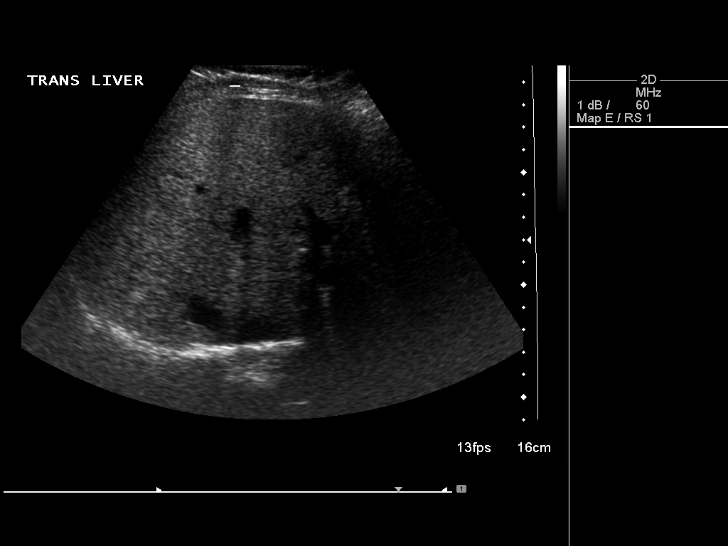
[im 26/76]
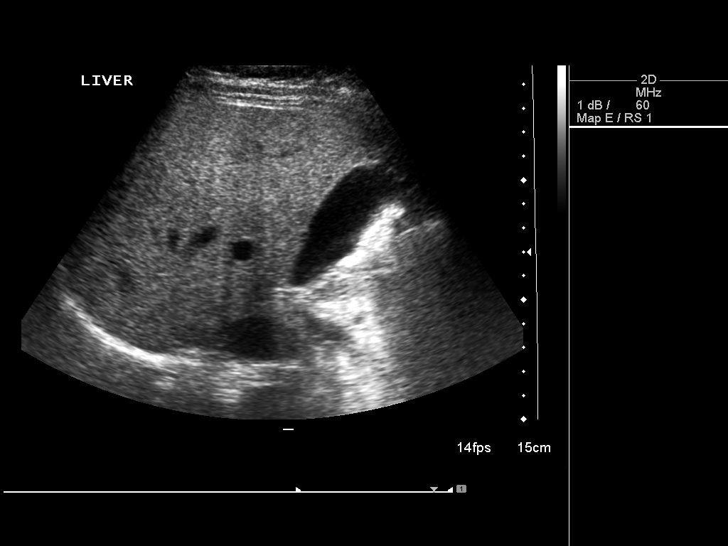
[im 32/76]
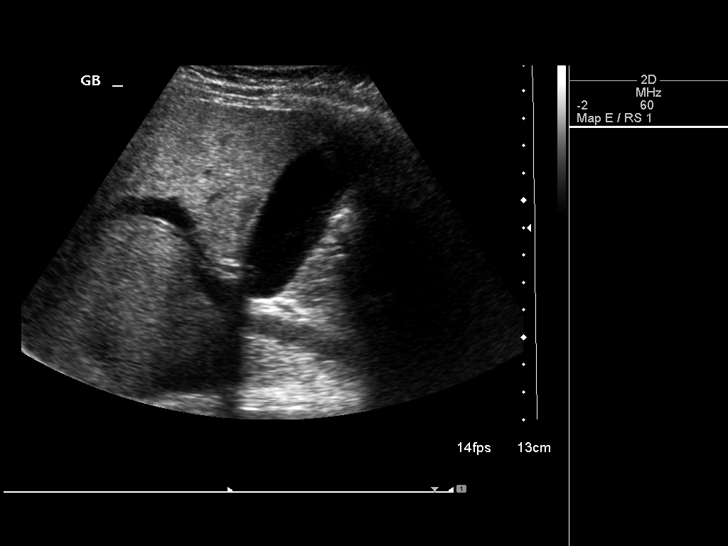
[im 38/76]
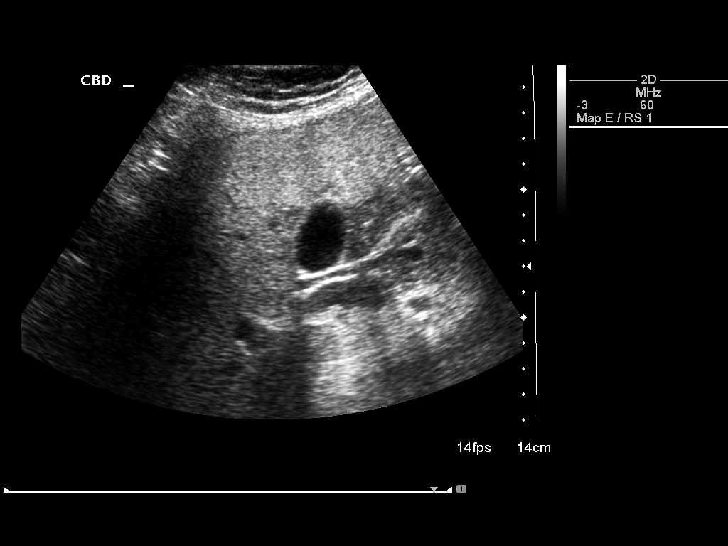
[im 44/76]
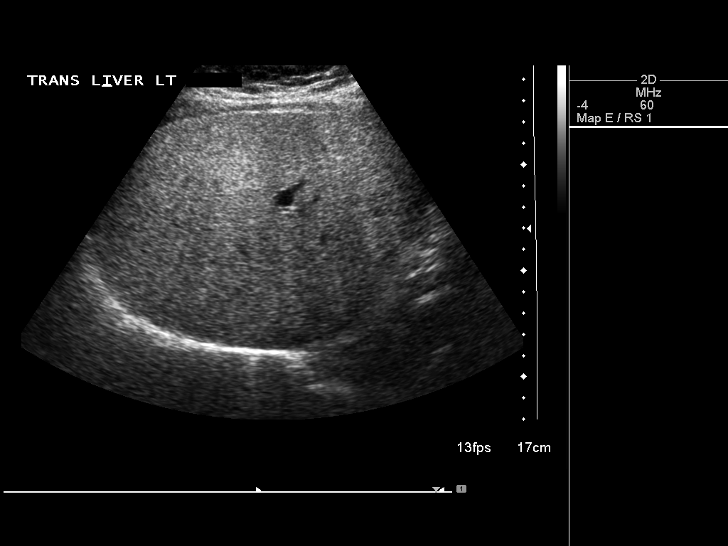
[im 51/76]
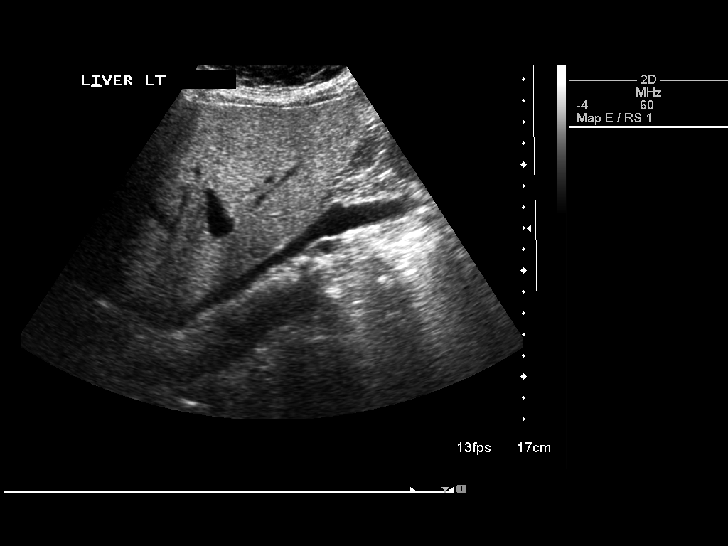
[im 57/76]
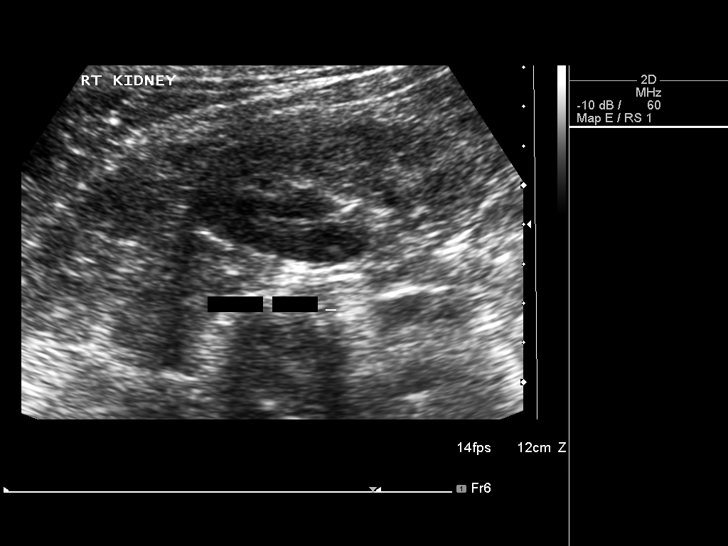
[im 63/76]
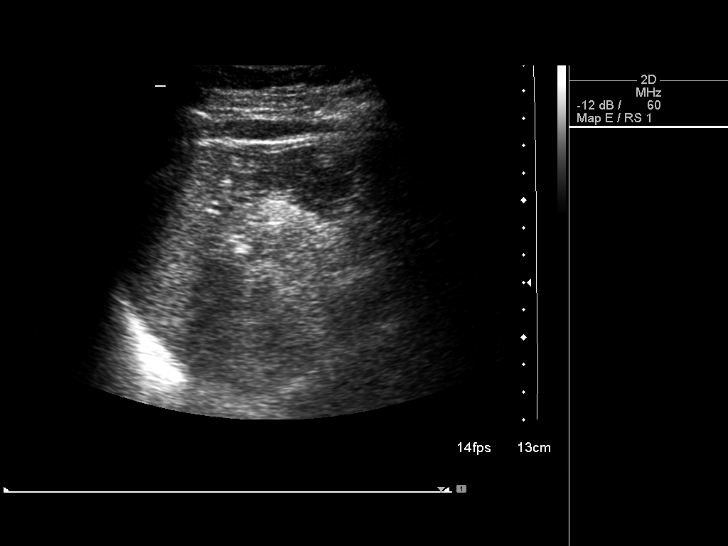
[im 69/76]
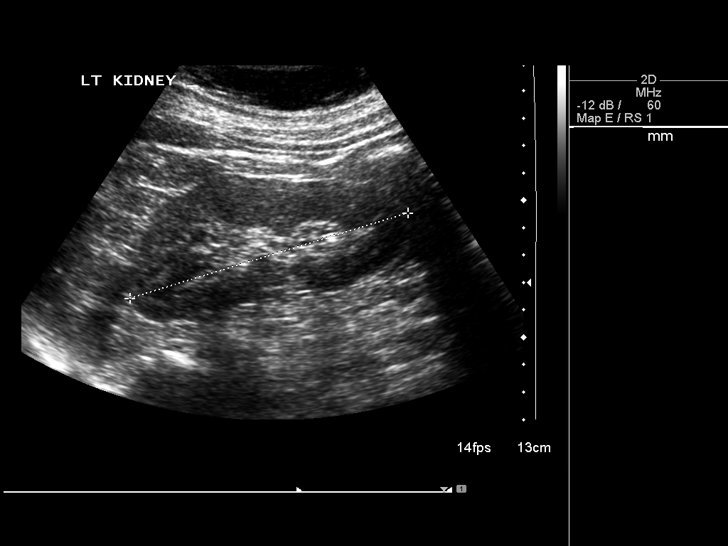
[im 76/76]
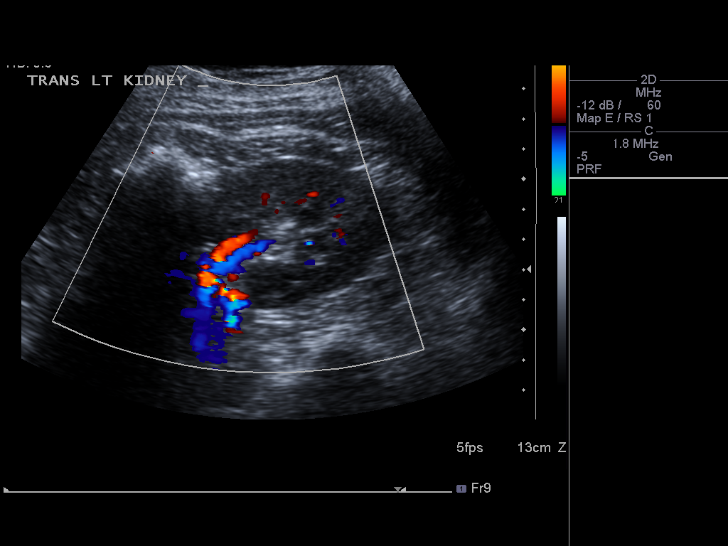

[13 of 25 positions shown; findings below may reference images not displayed]

FINDINGS: Gallbladder: No gallstones or wall thickening visualized. No
sonographic Murphy sign noted by sonographer.

Common bile duct: Diameter: 3.1 mm

Liver: The hepatic echotexture is mildly increased diffusely. An
area of focal fatty sparing is suspected adjacent to the
gallbladder. There is no intrahepatic ductal dilation. There is no
solid mass. The surface contour of the liver appears normal.

IVC: No abnormality visualized.

Pancreas: Bowel gas obscures the pancreatic head and tail. The
pancreatic body is unremarkable.

Spleen: Size and appearance within normal limits.

Right Kidney: Length: 10.1 cm. Echogenicity within normal limits. No
mass or hydronephrosis visualized.

Left Kidney: Length: 10.6 cm. Echogenicity within normal limits. No
mass or hydronephrosis visualized.

Abdominal aorta: No aneurysm visualized.

Other findings: There is no ascites.
IMPRESSION: 1. No acute hepatobiliary abnormality is observed. If there are
clinical concerns of gallbladder dysfunction, a nuclear medicine
hepatobiliary scan may be useful.
2. Fatty infiltrative change of the liver.
3. No acute abnormality observed elsewhere within the abdomen.

## 2016-07-31 IMAGING — CT CT CHEST W/ CM
2 of 3 series · 14 of 31 positions shown, 16 images · IV contrast (75CC ISOVUE 300)
Comparison: 07/02/2011 CT abdomen/ pelvis. No prior chest CT.
04/25/2015 chest radiograph.

CLINICAL DATA: Intermittent mid chest pain for several years
radiating to the upper extremities and jaw. Occasional shortness of
breath.

EXAM:
CT CHEST WITH CONTRAST
TECHNIQUE: Multidetector CT imaging of the chest was performed during
intravenous contrast administration.

[Series 3: chest with · axial · 0.70mm/px · z∈[-219,-24]mm · 6 of 55 slices shown, 8 images]
[im 8/55  mediastinal]
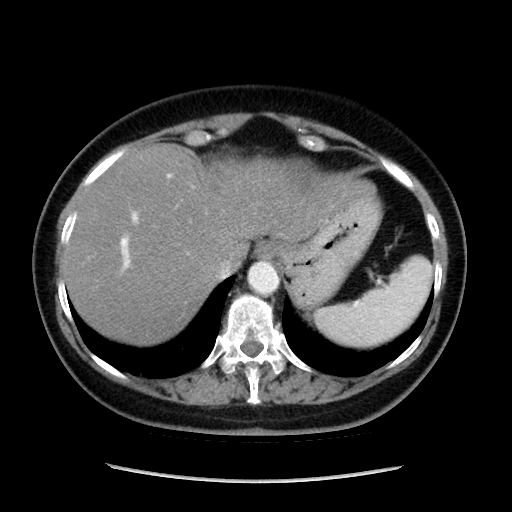
[im 8/55  lung]
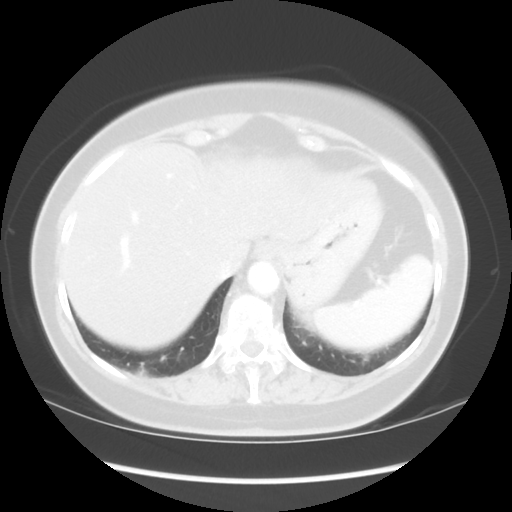
[im 16/55  lung]
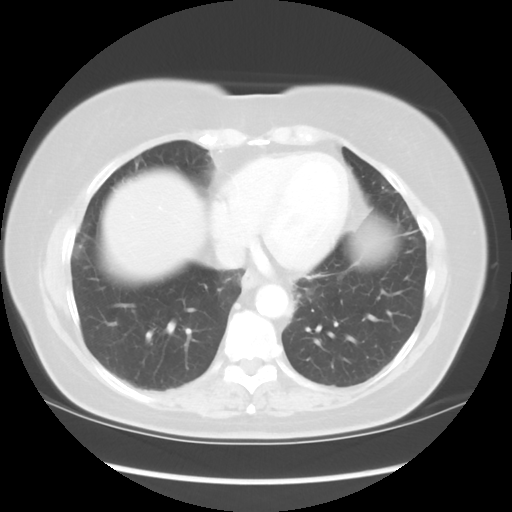
[im 24/55  lung]
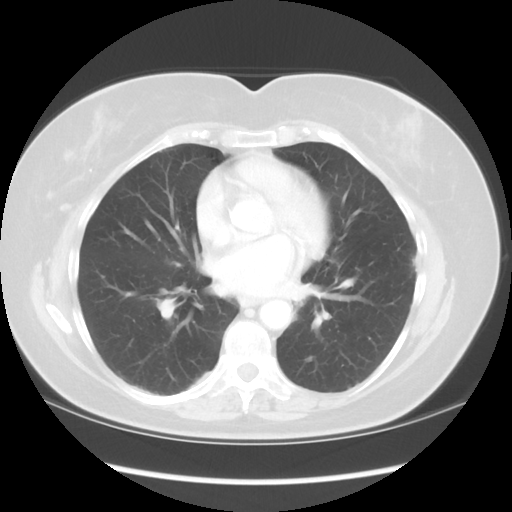
[im 31/55  lung]
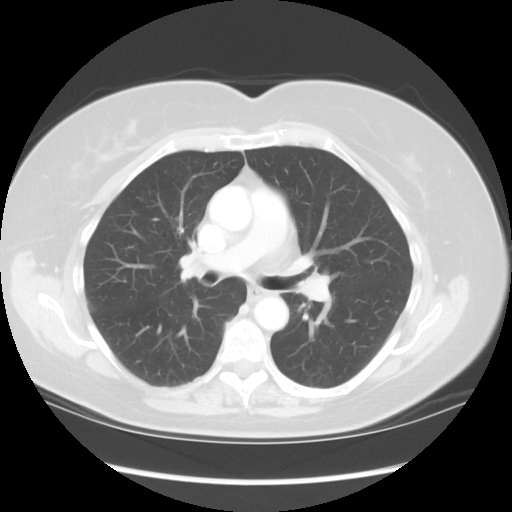
[im 39/55  mediastinal]
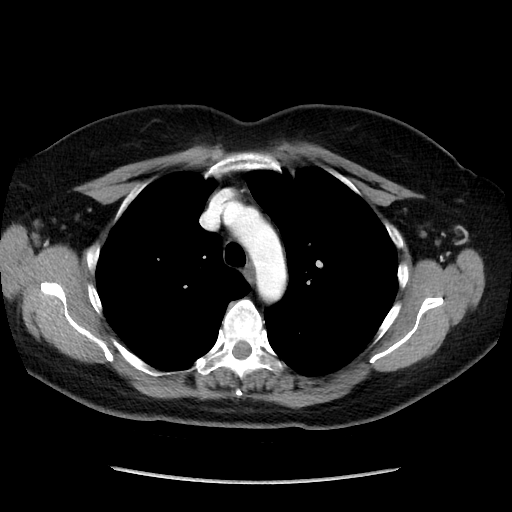
[im 39/55  lung]
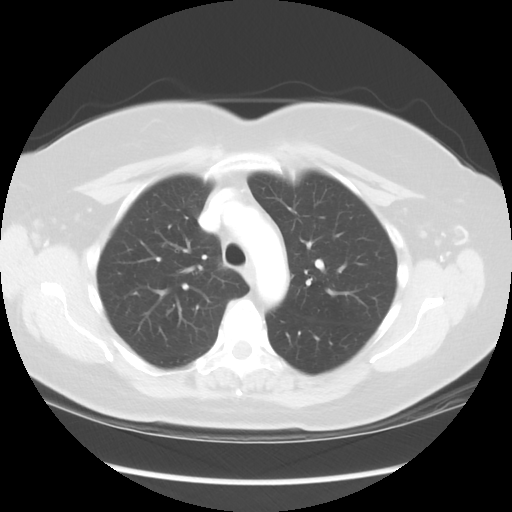
[im 47/55  lung]
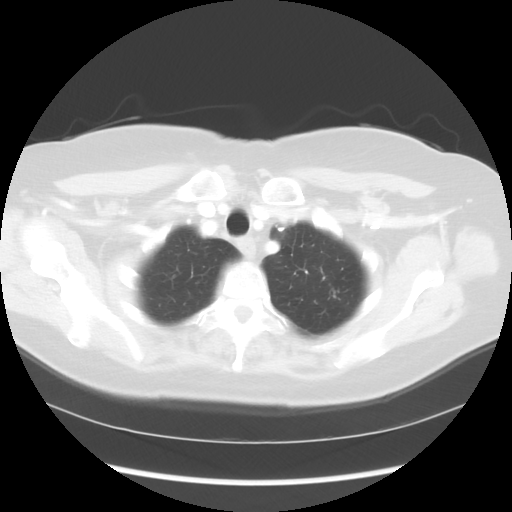

[Series 602: sagittal body · sagittal · 0.70mm/px · 8 of 145 slices shown]
[im 16/145  mediastinal]
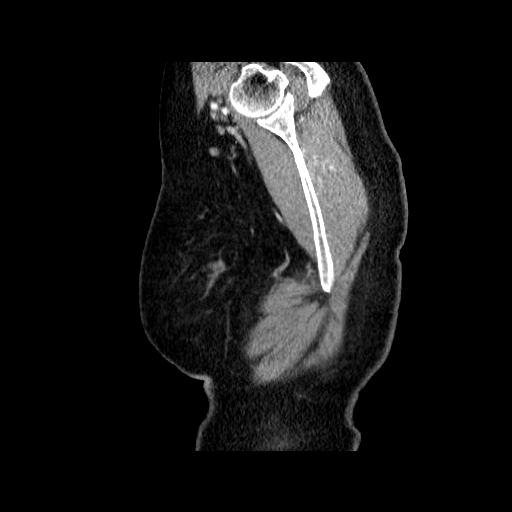
[im 31/145  mediastinal]
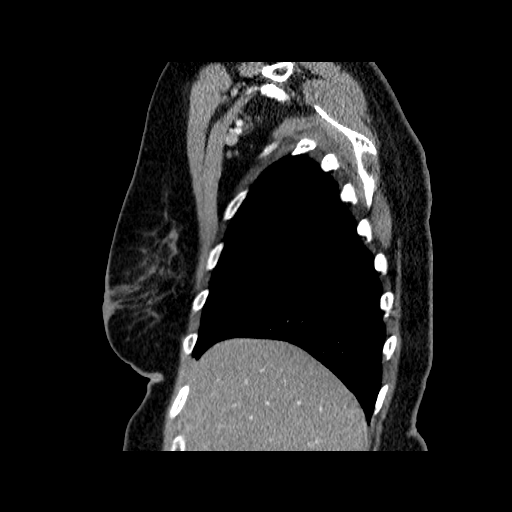
[im 46/145  mediastinal]
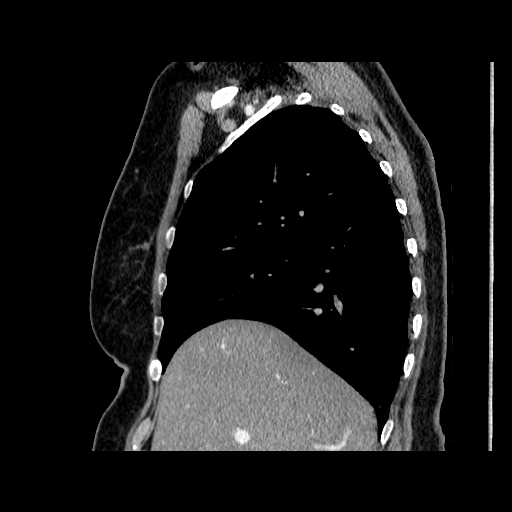
[im 69/145  mediastinal]
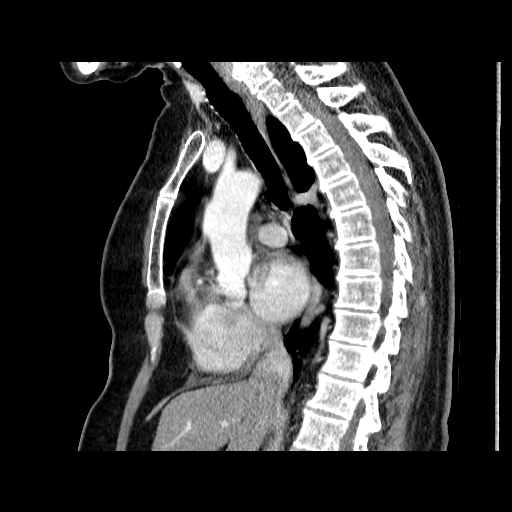
[im 76/145  mediastinal]
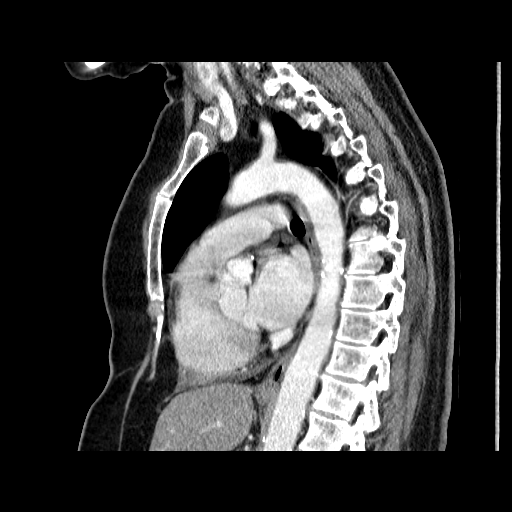
[im 99/145  mediastinal]
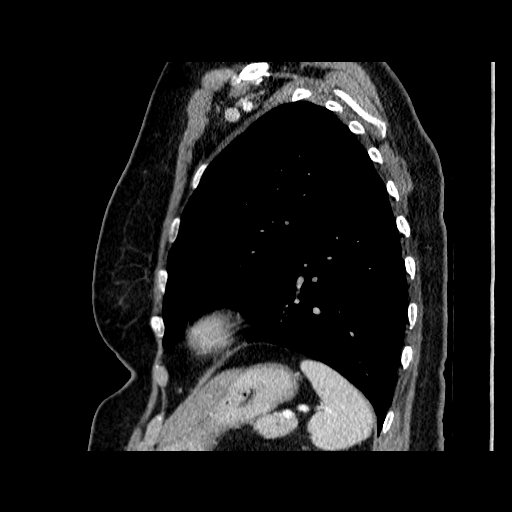
[im 114/145  mediastinal]
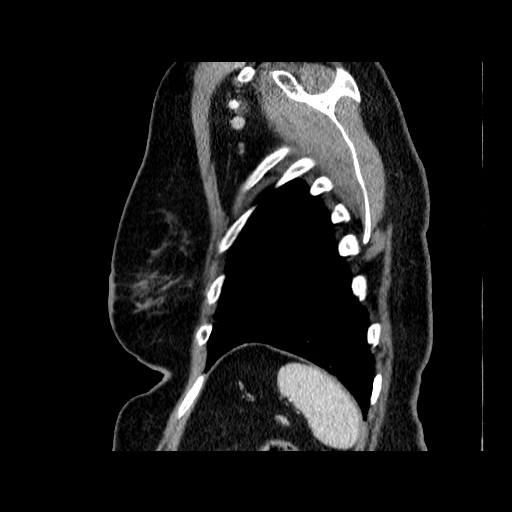
[im 129/145  mediastinal]
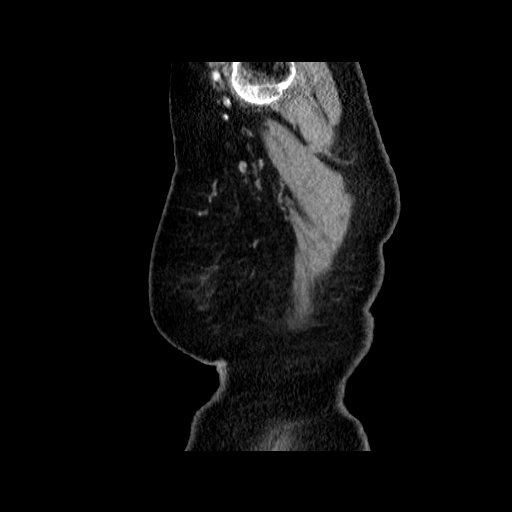

[14 of 31 positions shown; findings below may reference images not displayed]

The technologist reports that the patient has experienced sneezing
and throat itching after administration of IV contrast, and remained
comfortable and stable during a monitoring period of 20 minutes.
Oral fluids were provided to the patient. No medications were
administered.

CONTRAST:  75 cc Esovue-W99 IV.
FINDINGS: Mediastinum/Nodes: Normal heart size. No significant pericardial
fluid/thickening. Left main and left anterior descending coronary
atherosclerosis. Great vessels are normal in course and caliber. No
central pulmonary emboli. Hypodense bilateral subcentimeter thyroid
nodules, largest 0.9 cm in the upper left thyroid lobe, for which no
further follow-up is recommended. This follows ACR consensus
guidelines: Managing Incidental Thyroid Nodules Detected on Imaging:
White Paper of [REDACTED]. [HOSPITAL] 5305; [DATE]. Normal esophagus. No pathologically
enlarged axillary, mediastinal or hilar lymph nodes.

Lungs/Pleura: No pneumothorax. No pleural effusion. No acute
consolidative airspace disease or lung masses. There are several
scattered clusters of centrilobular nodularity / tree-in-bud type
opacity in both lungs, most prominent in the upper lobes (series
4/images 13 and 14). There are several scattered solid pulmonary
nodules in both lower lobes, most of which are seen to be stable
compared to the 07/02/2011 CT abdomen study, and thus benign. For
example a 6 mm right lower lobe solid pulmonary nodule (series 4/
image 33) and a 4 mm left lower lobe solid pulmonary nodule (series
4/ image 36) are both stable since 07/02/2011. There is a 7 mm
subpleural left lower lobe pulmonary nodule (series 4/image 37),
which is new since 07/02/2011.

Upper abdomen: Subcentimeter hypodense right liver dome lesion is
too small to characterize, unchanged since 07/02/2011 and benign.

Musculoskeletal: No aggressive appearing focal osseous lesions. Mild
degenerative changes in the thoracic spine.
IMPRESSION: 1. Symptoms of sneezing and throat itching occurred after
administration of IV iodinated contrast, which could represent a
mild iodinated contrast allergic reaction. Future IV iodinated
contrast administration should be preceded by a steroid preparation.
2. Scattered clusters of centrilobular nodularity/tree-in-bud type
opacity in both lungs, suggestive of a nonspecific infectious or
inflammatory bronchiolitis and/or mucoid impaction within dilated
terminal bronchioles.
3. New 7 mm left lower lobe subpleural pulmonary nodule. If the
patient is at high risk for bronchogenic carcinoma, follow-up
unenhanced chest CT at 3-6 months is recommended. If the patient is
at low risk for bronchogenic carcinoma, follow-up unenhanced chest
CT at 6-12 months is recommended. This recommendation follows the
consensus statement: Guidelines for Management of Small Pulmonary
Nodules Detected on CT Scans: A Statement from the [employer]
4. Additional subcentimeter pulmonary nodules at the lung bases are
stable since 07/02/2011 and benign.
5. Left main and 1 vessel coronary atherosclerosis.

## 2016-08-15 ENCOUNTER — Encounter (HOSPITAL_COMMUNITY): Payer: Self-pay

## 2016-08-15 ENCOUNTER — Ambulatory Visit (HOSPITAL_COMMUNITY)
Admission: RE | Admit: 2016-08-15 | Discharge: 2016-08-15 | Disposition: A | Discharge status: Home / Self Care | Payer: Medicare Other | Source: Ambulatory Visit | Attending: Psychiatry | Admitting: Psychiatry

## 2016-08-15 DIAGNOSIS — M810 Age-related osteoporosis without current pathological fracture: Secondary | ICD-10-CM | POA: Diagnosis present

## 2016-08-15 MED ORDER — SODIUM CHLORIDE 0.9 % IV SOLN
600.0000 mg | Freq: Once | INTRAVENOUS | Status: AC
  Administered 2016-08-15: 600 mg via INTRAVENOUS
  Filled 2016-08-15: qty 20

## 2016-08-15 MED ORDER — METHYLPREDNISOLONE SODIUM SUCC 125 MG IJ SOLR
100.0000 mg | Freq: Once | INTRAMUSCULAR | Status: AC
  Administered 2016-08-15: 100 mg via INTRAVENOUS
  Filled 2016-08-15: qty 2

## 2016-08-15 MED ORDER — DIPHENHYDRAMINE HCL 50 MG/ML IJ SOLN
25.0000 mg | Freq: Once | INTRAMUSCULAR | Status: AC
Start: 2016-08-15 — End: 2016-08-15
  Administered 2016-08-15: 25 mg via INTRAVENOUS
  Filled 2016-08-15: qty 1

## 2016-08-15 MED ORDER — SODIUM CHLORIDE 0.9 % IV SOLN
Freq: Once | INTRAVENOUS | Status: AC
  Administered 2016-08-15: 10:00:00 via INTRAVENOUS

## 2016-08-15 MED ORDER — ACETAMINOPHEN 325 MG PO TABS
650.0000 mg | ORAL_TABLET | Freq: Once | ORAL | Status: AC | PRN
  Administered 2016-08-15: 650 mg via ORAL
  Filled 2016-08-15: qty 2

## 2016-08-15 NOTE — Progress Notes (Signed)
Uneventful Ocrevus infusion.  Pt stayed for the 1 hour observation post infusion.  Pt states she has had the drug in [place] and this is her 2nd time getting the 600mg  dose.  Next infusion date for November given to pt.  D/c instructions on Ocrevus given to pt.  Pt was d/c home with her friend ambulatory to lobby.

## 2016-08-15 NOTE — Discharge Instructions (Signed)
Ocrelizumab injection What is this medicine? OCRELIZUMAB (ok re LIZ ue mab) treats multiple sclerosis. It helps to decrease the number of multiple sclerosis relapses. It is not a cure. This medicine may be used for other purposes; ask your health care provider or pharmacist if you have questions. COMMON BRAND NAME(S): OCREVUS What should I tell my health care provider before I take this medicine? They need to know if you have any of these conditions: -cancer -hepatitis B infection -other infection (especially a virus infection such as chickenpox, cold sores, or herpes) -an unusual or allergic reaction to ocrelizumab, other medicines, foods, dyes or preservatives -pregnant or trying to get pregnant -breast-feeding How should I use this medicine? This medicine is for infusion into a vein. It is given by a health care professional in a hospital or clinic setting. Talk to your pediatrician regarding the use of this medicine in children. Special care may be needed. Overdosage: If you think you have taken too much of this medicine contact a poison control center or emergency room at once. NOTE: This medicine is only for you. Do not share this medicine with others. What if I miss a dose? Keep appointments for follow-up doses as directed. It is important not to miss your dose. Call your doctor or health care professional if you are unable to keep an appointment. What may interact with this medicine? -alemtuzumab -daclizumab -dimethyl fumarate -fingolimod -glatiramer -interferon beta -live virus vaccines -mitoxantrone -natalizumab -peginterferon beta -rituximab -steroid medicines like prednisone or cortisone -teriflunomide This list may not describe all possible interactions. Give your health care provider a list of all the medicines, herbs, non-prescription drugs, or dietary supplements you use. Also tell them if you smoke, drink alcohol, or use illegal drugs. Some items may interact with  your medicine. What should I watch for while using this medicine? Tell your doctor or healthcare professional if your symptoms do not start to get better or if they get worse. This medicine can cause serious allergic reactions. To reduce your risk you may need to take medicine before treatment with this medicine. Take your medicine as directed. Women should inform their doctor if they wish to become pregnant or think they might be pregnant. There is a potential for serious side effects to an unborn child. Talk to your health care professional or pharmacist for more information. Female patients should use effective birth control methods while receiving this medicine and for 6 months after the last dose. Call your doctor or health care professional for advice if you get a fever, chills or sore throat, or other symptoms of a cold or flu. Do not treat yourself. This drug decreases your body's ability to fight infections. Try to avoid being around people who are sick. If you have a hepatitis B infection or a history of a hepatitis B infection, talk to your doctor. The symptoms of hepatitis B may get worse if you take this medicine. In some patients, this medicine may cause a serious brain infection that may cause death. If you have any problems seeing, thinking, speaking, walking, or standing, tell your doctor right away. If you cannot reach your doctor, urgently seek other source of medical care. This medicine can decrease the response to a vaccine. If you need to get vaccinated, tell your healthcare professional if you have received this medicine. Extra booster doses may be needed. Talk to your doctor to see if a different vaccination schedule is needed. Talk to your doctor about your risk of cancer.   You may be more at risk for certain types of cancers if you take this medicine. What side effects may I notice from receiving this medicine? Side effects that you should report to your doctor or health care  professional as soon as possible: -allergic reactions like skin rash, itching or hives, swelling of the face, lips, or tongue -breathing problems -facial flushing -fast, irregular heartbeat -lump or soreness in the breast -signs and symptoms of herpes such as cold sore, shingles, or genital sores -signs and symptoms of infection like fever or chills, cough, sore throat, pain or trouble passing urine -signs and symptoms of low blood pressure like dizziness; feeling faint or lightheaded, falls; unusually weak or tired -signs and symptoms of progressive multifocal leukoencephalopathy (PML) like changes in vision; clumsiness; confusion; personality changes; weakness on one side of the body -swelling of the ankles, feet, hands Side effects that usually do not require medical attention (report these to your doctor or health care professional if they continue or are bothersome): -back pain -depressed mood -diarrhea -pain, redness, or irritation at site where injected This list may not describe all possible side effects. Call your doctor for medical advice about side effects. You may report side effects to FDA at 1-800-FDA-1088. Where should I keep my medicine? This drug is given in a hospital or clinic and will not be stored at home. NOTE: This sheet is a summary. It may not cover all possible information. If you have questions about this medicine, talk to your doctor, pharmacist, or health care provider.  2018 Elsevier/Gold Standard (2015-06-30 09:40:25)  

## 2016-10-05 ENCOUNTER — Telehealth: Payer: Self-pay | Admitting: Internal Medicine

## 2016-10-05 ENCOUNTER — Other Ambulatory Visit: Payer: Medicare Other

## 2016-10-05 NOTE — Telephone Encounter (Signed)
Noted  

## 2016-10-05 NOTE — Telephone Encounter (Signed)
Must be done within an office visit. I have spoken with pt and offered abx and prednisone per MR recommendations, pt declined and requested appt.  Pt has been scheduled for OV with TP on 10/07/16 @ 12:00. MR would you like for feno to be performed during this OV, or wait until pt is well to avoid false reading?  MR please advise. Thanks.

## 2016-10-05 NOTE — Telephone Encounter (Signed)
Spoke with patient. She states that she has been having increased wheezing for the past few weeks. She just returned from a beach trip and her wheezing so bad that she keep her daughter up with the sound. She has a productive cough with clear phlegm. Denies any chest pain or fever. She wanted to know if she should be seen in the office this week. Offered her an appt with a NP but she stated that she rather see MR.   MR, please advise. Thanks!

## 2016-10-05 NOTE — Telephone Encounter (Signed)
When I last saw her in nov 2017 I thought chest tightness reslved after GI interventions and dc'ed her from [place]. Her feno was normal then.   Please ask her  1. If she can come today and just get a feno done - so I can know if she has true asthma or not> If  feno high then we can made dx that her asthma has come back  2. Then after feno - she can get right Rx of sterids  IF above plan wont work   - then Please take prednisone 40 mg x1 day, then 30 mg x1 day, then 20 mg x1 day, then 10 mg x1 day, and then 5 mg x1 day and stop  - cephalexin 500mg  three times daily x  5 days  - come in tomorrow to see APP and if APP feels there is asthma - need to start maintenance inhaler    Allergies  Allergen Reactions  . Crestor [Rosuvastatin] Other (See Comments)    UNSPECIFIED   . Vicodin [Hydrocodone-Acetaminophen]   . Isovue [Iopamidol]     Pt had sneezing and itching of her throat and soft palate.  Dr Alvester Chou checked pt.  She will need premeds in the future.  [name]

## 2016-10-05 NOTE — Telephone Encounter (Signed)
lmom tcb x1 to pt  

## 2016-10-05 NOTE — Telephone Encounter (Signed)
Patient returned call, CB is [phone]. States she will stay by her phone.

## 2016-10-05 NOTE — Telephone Encounter (Signed)
If she gets a feno after prednsisone can be false negative. However, if wheezing is bad she should take it now instead of waiting 2 days before feno and eval

## 2016-10-07 ENCOUNTER — Other Ambulatory Visit (INDEPENDENT_AMBULATORY_CARE_PROVIDER_SITE_OTHER): Payer: Medicare Other

## 2016-10-07 ENCOUNTER — Emergency Department (HOSPITAL_COMMUNITY)
Admission: EM | Admit: 2016-10-07 | Discharge: 2016-10-07 | Disposition: A | Discharge status: Home / Self Care | Payer: Medicare Other | Attending: Emergency Medicine | Admitting: Emergency Medicine

## 2016-10-07 ENCOUNTER — Encounter: Payer: Self-pay | Admitting: Adult Health

## 2016-10-07 ENCOUNTER — Ambulatory Visit (INDEPENDENT_AMBULATORY_CARE_PROVIDER_SITE_OTHER): Payer: Medicare Other | Admitting: Adult Health

## 2016-10-07 ENCOUNTER — Emergency Department (HOSPITAL_COMMUNITY): Payer: Medicare Other

## 2016-10-07 ENCOUNTER — Encounter (HOSPITAL_COMMUNITY): Payer: Self-pay | Admitting: Oncology

## 2016-10-07 ENCOUNTER — Ambulatory Visit (INDEPENDENT_AMBULATORY_CARE_PROVIDER_SITE_OTHER)
Admission: RE | Admit: 2016-10-07 | Discharge: 2016-10-07 | Disposition: A | Discharge status: Home / Self Care | Payer: Medicare Other | Source: Ambulatory Visit | Attending: Adult Health | Admitting: Adult Health

## 2016-10-07 VITALS — BP 104/64 | HR 81 | Ht 62.0 in | Wt 154.2 lb

## 2016-10-07 DIAGNOSIS — I2510 Atherosclerotic heart disease of native coronary artery without angina pectoris: Secondary | ICD-10-CM | POA: Diagnosis not present

## 2016-10-07 DIAGNOSIS — R0789 Other chest pain: Secondary | ICD-10-CM

## 2016-10-07 DIAGNOSIS — R062 Wheezing: Secondary | ICD-10-CM

## 2016-10-07 DIAGNOSIS — R0602 Shortness of breath: Secondary | ICD-10-CM | POA: Insufficient documentation

## 2016-10-07 DIAGNOSIS — J4521 Mild intermittent asthma with (acute) exacerbation: Secondary | ICD-10-CM | POA: Diagnosis not present

## 2016-10-07 DIAGNOSIS — Z79899 Other long term (current) drug therapy: Secondary | ICD-10-CM | POA: Insufficient documentation

## 2016-10-07 DIAGNOSIS — J45909 Unspecified asthma, uncomplicated: Secondary | ICD-10-CM | POA: Insufficient documentation

## 2016-10-07 DIAGNOSIS — J4541 Moderate persistent asthma with (acute) exacerbation: Secondary | ICD-10-CM | POA: Diagnosis not present

## 2016-10-07 DIAGNOSIS — R05 Cough: Secondary | ICD-10-CM | POA: Diagnosis present

## 2016-10-07 DIAGNOSIS — Z7982 Long term (current) use of aspirin: Secondary | ICD-10-CM | POA: Diagnosis not present

## 2016-10-07 LAB — CBC WITH DIFFERENTIAL/PLATELET
Basophils Absolute: 0 10*3/uL (ref 0.0–0.1)
Basophils Absolute: 0 10*3/uL (ref 0.0–0.1)
Basophils Relative: 0.7 % (ref 0.0–3.0)
Basophils Relative: 1 %
EOS PCT: 6 % — AB (ref 0.0–5.0)
EOS PCT: 6 %
Eosinophils Absolute: 0.4 10*3/uL (ref 0.0–0.7)
Eosinophils Absolute: 0.3 10*3/uL (ref 0.0–0.7)
HEMATOCRIT: 42.7 % (ref 36.0–46.0)
HEMATOCRIT: 39.5 % (ref 36.0–46.0)
Hemoglobin: 14.6 g/dL (ref 12.0–15.0)
Hemoglobin: 13.7 g/dL (ref 12.0–15.0)
LYMPHS ABS: 1.7 10*3/uL (ref 0.7–4.0)
LYMPHS ABS: 1.5 10*3/uL (ref 0.7–4.0)
LYMPHS PCT: 25.2 % (ref 12.0–46.0)
LYMPHS PCT: 25 %
MCH: 30.7 pg (ref 26.0–34.0)
MCHC: 34.2 g/dL (ref 30.0–36.0)
MCHC: 34.7 g/dL (ref 30.0–36.0)
MCV: 91.1 fl (ref 78.0–100.0)
MCV: 88.6 fL (ref 78.0–100.0)
MONO ABS: 0.8 10*3/uL (ref 0.1–1.0)
MONOS PCT: 12.5 % — AB (ref 3.0–12.0)
MONOS PCT: 13 %
Monocytes Absolute: 0.8 10*3/uL (ref 0.1–1.0)
NEUTROS ABS: 3.4 10*3/uL (ref 1.7–7.7)
Neutro Abs: 3.7 10*3/uL (ref 1.4–7.7)
Neutrophils Relative %: 55.6 % (ref 43.0–77.0)
Neutrophils Relative %: 55 %
PLATELETS: 230 10*3/uL (ref 150–400)
Platelets: 264 10*3/uL (ref 150.0–400.0)
RBC: 4.69 Mil/uL (ref 3.87–5.11)
RBC: 4.46 MIL/uL (ref 3.87–5.11)
RDW: 13.2 % (ref 11.5–15.5)
RDW: 12.6 % (ref 11.5–15.5)
WBC: 6.7 10*3/uL (ref 4.0–10.5)
WBC: 6 10*3/uL (ref 4.0–10.5)

## 2016-10-07 LAB — BASIC METABOLIC PANEL
Anion gap: 10 (ref 5–15)
BUN: 16 mg/dL (ref 6–20)
CALCIUM: 9.2 mg/dL (ref 8.9–10.3)
CO2: 23 mmol/L (ref 22–32)
CREATININE: 0.77 mg/dL (ref 0.44–1.00)
Chloride: 108 mmol/L (ref 101–111)
GFR calc Af Amer: >60 mL/min (ref >60)
GLUCOSE: 128 mg/dL — AB (ref 65–99)
Potassium: 4 mmol/L (ref 3.5–5.1)
Sodium: 141 mmol/L (ref 135–145)

## 2016-10-07 LAB — NITRIC OXIDE: NITRIC OXIDE: 180

## 2016-10-07 LAB — TROPONIN I: Troponin I: <0.03 ng/mL (ref <0.03)

## 2016-10-07 MED ORDER — METHYLPREDNISOLONE SODIUM SUCC 125 MG IJ SOLR
125.0000 mg | Freq: Once | INTRAMUSCULAR | Status: AC
  Administered 2016-10-07: 125 mg via INTRAVENOUS
  Filled 2016-10-07: qty 2

## 2016-10-07 MED ORDER — SODIUM CHLORIDE 0.9 % IV SOLN
INTRAVENOUS | Status: DC
  Administered 2016-10-07: 20 mL/h via INTRAVENOUS

## 2016-10-07 MED ORDER — BENZONATATE 100 MG PO CAPS: 100.0000 mg | ORAL_CAPSULE | Freq: Three times a day (TID) | ORAL | 0 refills | Status: DC

## 2016-10-07 MED ORDER — MONTELUKAST SODIUM 10 MG PO TABS: 10.0000 mg | ORAL_TABLET | Freq: Every day | ORAL | 11 refills | Status: DC

## 2016-10-07 MED ORDER — ALBUTEROL (5 MG/ML) CONTINUOUS INHALATION SOLN
10.0000 mg/h | INHALATION_SOLUTION | Freq: Once | RESPIRATORY_TRACT | Status: AC
  Administered 2016-10-07: 10 mg/h via RESPIRATORY_TRACT
  Filled 2016-10-07: qty 20

## 2016-10-07 MED ORDER — METHYLPREDNISOLONE ACETATE 80 MG/ML IJ SUSP
80.0000 mg | Freq: Once | INTRAMUSCULAR | Status: AC
  Administered 2016-10-07: 120 mg via INTRAMUSCULAR

## 2016-10-07 MED ORDER — BENZONATATE 100 MG PO CAPS
200.0000 mg | ORAL_CAPSULE | Freq: Once | ORAL | Status: AC
  Administered 2016-10-07: 200 mg via ORAL
  Filled 2016-10-07: qty 2

## 2016-10-07 MED ORDER — AZITHROMYCIN 250 MG PO TABS: ORAL_TABLET | ORAL | 0 refills | Status: AC

## 2016-10-07 MED ORDER — PREDNISONE 10 MG PO TABS: ORAL_TABLET | ORAL | 0 refills | Status: DC

## 2016-10-07 MED ORDER — ALBUTEROL SULFATE (2.5 MG/3ML) 0.083% IN NEBU
5.0000 mg | INHALATION_SOLUTION | Freq: Once | RESPIRATORY_TRACT | Status: AC
  Administered 2016-10-07: 5 mg via RESPIRATORY_TRACT
  Filled 2016-10-07: qty 6

## 2016-10-07 MED ORDER — LEVALBUTEROL HCL 0.63 MG/3ML IN NEBU
0.6300 mg | INHALATION_SOLUTION | Freq: Once | RESPIRATORY_TRACT | Status: AC
  Administered 2016-10-07: 0.63 mg via RESPIRATORY_TRACT

## 2016-10-07 MED ORDER — BUDESONIDE-FORMOTEROL FUMARATE 160-4.5 MCG/ACT IN AERO: 2.0000 | INHALATION_SPRAY | Freq: Two times a day (BID) | RESPIRATORY_TRACT | 5 refills | Status: DC

## 2016-10-07 MED ORDER — SODIUM CHLORIDE 0.9 % IV SOLN: INTRAVENOUS | Status: DC

## 2016-10-07 NOTE — Assessment & Plan Note (Signed)
Acute asthma exacerbation with upper strength infection. Check chest x-ray today. Xopenex neb treatment given in office with decreased wheezing. She'll be treated with antibiotics, steroids, and a Depo-Medrol 120 IM injection. She is to restart her Symbicort. We'll begin trigger control with Zyrtec. And Singulair. Patient did inquire about going to a pool party later today however I recommended that she avoid this as it is extremely hot outside and did not feel that she should go. I recommended to her and her family that if symptoms do not improve or worsen, she should call back or seek emergency attention.  Plan  Patient Instructions  Zpack take as  Directed.  Prednisone taper over next week.  Chest xray today .  Mucinex DM Twice daily  As needed  Cough /congestion  Begin Zyrtec 10mg  At bedtime   Begin Singulair 10mg  At bedtime   Restart Symbicort 160 2 puffs Twice daily  , rinse after use.  Labs today .  follow up [name] in 6-8 weeks and As needed   Please contact office for sooner follow up if symptoms do not improve or worsen or seek emergency care

## 2016-10-07 NOTE — Progress Notes (Signed)
@Patient  ID: [name], female    DOB: May 10, 1945, 72 y.o.   MRN: [id number]  Chief Complaint  Patient presents with  . Acute Visit    cough wheezing     Referring provider: [name], MD  HPI: 71 year old female never smoker followed for chest tightness  and lung nodule  10/07/2016 Acute OV  Patient presents for an acute office visit. Patient complains over the last 2 weeks, that she's had increased cough, congestion with thick mucus and wheezing. Cough has increased over the last few days. Patient had previously been on Symbicort but stopped it in the last several days. She has increased her albuterol use. She denies any hemoptysis, chest pain, orthopnea, PND, or increased leg swelling. She had no recent travel or antibiotic use. Exhaled nitric oxide test was very high today at 180.    Allergies  Allergen Reactions  . Crestor [Rosuvastatin] Other (See Comments)    UNSPECIFIED   . Vicodin [Hydrocodone-Acetaminophen]   . Isovue [Iopamidol]     Pt had sneezing and itching of her throat and soft palate.  Dr Alvester Chou checked pt.  She will need premeds in the future.  J Bohm    Immunization History  Administered Date(s) Administered  . Influenza, High Dose Seasonal PF 12/22/2013, 12/14/2015  . Influenza,inj,Quad PF,36+ Mos 11/27/2014  . Pneumococcal-Unspecified 10/26/2013    Past Medical History:  Diagnosis Date  . Anemia    past history-many yrs ago  . Anginal pain ([place])    being evaluated by Dr. Tyrone Sage, arm pain,"bad indigestion" -no heart related findings as of yet  . Arthritis    hip. back pain  . Complication of anesthesia    s/p Hysterectomy "vagal response "heart stopped" -did not require shocking.  . Coronary artery disease   . Dry eyes   . Esophageal spasm   . GERD (gastroesophageal reflux disease)   . MS (multiple sclerosis) ([place])    stable-sees [name] every 6 months    Tobacco History: History  Smoking Status  . Never Smoker  Smokeless  Tobacco  . Never Used   Counseling given: Not Answered   Outpatient Encounter Prescriptions as of 10/07/2016  Medication Sig  . amLODipine (NORVASC) 5 MG tablet TAKE 1 TABLET ONCE DAILY.  [name] aspirin EC 81 MG tablet Take 1 tablet (81 mg total) by mouth daily.  [name] azithromycin (ZITHROMAX Z-PAK) 250 MG tablet Take 2 tablets (500 mg) on  Day 1,  followed by 1 tablet (250 mg) once daily on Days 2 through 5.  . budesonide-formoterol (SYMBICORT) 160-4.5 MCG/ACT inhaler Inhale 2 puffs into the lungs 2 (two) times daily.  . carvedilol (COREG) 6.25 MG tablet TAKE 1 TABLET TWICE DAILY WITH FOOD.  [name] dicyclomine (BENTYL) 10 MG capsule Take 10 mg by mouth daily as needed.  . gabapentin (NEURONTIN) 300 MG capsule Take 600 mg by mouth 4 (four) times daily as needed.   . isosorbide mononitrate (IMDUR) 60 MG 24 hr tablet TAKE 1 TABLET ONCE DAILY.  . meloxicam (MOBIC) 7.5 MG tablet Take 1 tablet by mouth daily. For 2 weeks  . montelukast (SINGULAIR) 10 MG tablet Take 1 tablet (10 mg total) by mouth at bedtime.  . nitroGLYCERIN (NITROSTAT) 0.4 MG SL tablet Place 1 tablet (0.4 mg total) under the tongue every 5 (five) minutes as needed for chest pain.  [name] ocrelizumab (OCREVUS) 300 MG/10ML injection Inject 600 mg into the vein every 6 (six) months.  . pantoprazole (PROTONIX) 20 MG tablet Take 20  mg by mouth 2 (two) times daily.  [name] PARoxetine (PAXIL) 10 MG tablet Take 5 mg by mouth every morning.   . predniSONE (DELTASONE) 10 MG tablet 4 tabs for 2 days, then 3 tabs for 2 days, 2 tabs for 2 days, then 1 tab for 2 days, then stop  . predniSONE (DELTASONE) 20 MG tablet   . ranitidine (ZANTAC) 150 MG tablet Take 1 tablet (150 mg total) by mouth at bedtime. (Patient taking differently: Take 150 mg by mouth as needed. )  . VYTORIN 10-20 MG per tablet Take 1 tablet by mouth daily.   . [EXPIRED] levalbuterol (XOPENEX) nebulizer solution 0.63 mg   . [EXPIRED] methylPREDNISolone acetate (DEPO-MEDROL) injection 80 mg    No  facility-administered encounter medications on file as of 10/07/2016.      Review of Systems  Constitutional:   No  weight loss, night sweats,  Fevers, chills, fatigue, or  lassitude.  HEENT:   No headaches,  Difficulty swallowing,  Tooth/dental problems, or  Sore throat,                No sneezing, itching, ear ache,  +nasal congestion, post nasal drip,   CV:  No chest pain,  Orthopnea, PND, swelling in lower extremities, anasarca, dizziness, palpitations, syncope.   GI  No heartburn, indigestion, abdominal pain, nausea, vomiting, diarrhea, change in bowel habits, loss of appetite, bloody stools.   Resp:   No chest wall deformity  Skin: no rash or lesions.  GU: no dysuria, change in color of urine, no urgency or frequency.  No flank pain, no hematuria   MS:  No joint pain or swelling.  No decreased range of motion.  No back pain.    Physical Exam  BP 104/64 (BP Location: Left Arm, Cuff Size: Normal)   Pulse 81   Ht 5\' 2"  (1.575 m)   Wt 154 lb 3.2 oz (69.9 kg)   SpO2 97%   BMI 28.20 kg/m   GEN: A/Ox3; pleasant , NAD, elderly    HEENT:  [place]/AT,  EACs-clear, TMs-wnl, NOSE-clear, THROAT-clear, no lesions, no postnasal drip or exudate noted.   NECK:  Supple w/ fair ROM; no JVD; normal carotid impulses w/o bruits; no thyromegaly or nodules palpated; no lymphadenopathy.  No stridor   RESP  + exp wheezing , speaking in full sentences i. no accessory muscle use, no dullness to percussion  CARD:  RRR, no m/r/g, no peripheral edema, pulses intact, no cyanosis or clubbing.  GI:   Soft & nt; nml bowel sounds; no organomegaly or masses detected.   Musco: Warm bil, no deformities or joint swelling noted.   Neuro: alert, no focal deficits noted.    Skin: Warm, no lesions or rashes   BNP No results found for: BNP  ProBNP No results found for: PROBNP  Imaging: Dg Chest 2 View  Result Date: 10/07/2016 CLINICAL DATA:  Cough, tightness and wheezing x 3 weeks; worsening. Hx  of HTN, asthma. EXAM: CHEST  2 VIEW COMPARISON:  05/09/2016 FINDINGS: Midline trachea. Normal heart size and mediastinal contours. No pleural effusion or pneumothorax. Diffuse peribronchial thickening. Mild left base scarring remains. IMPRESSION: No acute cardiopulmonary disease. Pulmonary interstitial prominence, likely related to the clinical history of asthma. Electronically Signed   By: Abigail Miyamoto M.D.   On: 10/07/2016 15:24     Assessment & Plan:   Asthma exacerbation Acute asthma exacerbation with upper strength infection. Check chest x-ray today. Xopenex neb treatment given in office with decreased  wheezing. She'll be treated with antibiotics, steroids, and a Depo-Medrol 120 IM injection. She is to restart her Symbicort. We'll begin trigger control with Zyrtec. And Singulair. Patient did inquire about going to a pool party later today however I recommended that she avoid this as it is extremely hot outside and did not feel that she should go. I recommended to her and her family that if symptoms do not improve or worsen, she should call back or seek emergency attention.  Plan  Patient Instructions  Zpack take as  Directed.  Prednisone taper over next week.  Chest xray today .  Mucinex DM Twice daily  As needed  Cough /congestion  Begin Zyrtec 10mg  At bedtime   Begin Singulair 10mg  At bedtime   Restart Symbicort 160 2 puffs Twice daily  , rinse after use.  Labs today .  follow up [name] in 6-8 weeks and As needed   Please contact office for sooner follow up if symptoms do not improve or worsen or seek emergency care         Rexene Edison, NP 10/07/2016

## 2016-10-07 NOTE — Patient Instructions (Signed)
Zpack take as  Directed.  Prednisone taper over next week.  Chest xray today .  Mucinex DM Twice daily  As needed  Cough /congestion  Begin Zyrtec 10mg  At bedtime   Begin Singulair 10mg  At bedtime   Restart Symbicort 160 2 puffs Twice daily  , rinse after use.  Labs today .  follow up [name] in 6-8 weeks and As needed   Please contact office for sooner follow up if symptoms do not improve or worsen or seek emergency care

## 2016-10-07 NOTE — ED Notes (Signed)
Patient transported to X-ray 

## 2016-10-07 NOTE — ED Provider Notes (Signed)
[facility] DEPT Provider Note   CSN: [id number] Arrival date & time: 10/07/16  1904     History   Chief Complaint Chief Complaint  Patient presents with  . Cough    HPI [name] is a 71 y.o. female.  71 year old female comes in with persistent cough times several weeks. Was seen by her PCP today and that note was reviewed and patient was given dose of steroids in the office as well as placed on Z-Pak. Felt that she was having an asthma exacerbation and patient does have a history of asthma. States that when she got home she began to develop more coughing associated with dizziness. She does not have any dizziness when she is not coughing. Denies any leg pain or swelling. No pleuritic chest pain. No anginal type pain. Use her inhaler at home without relief. Spoke to her Dr. on the the phone and was told to come here.      Past Medical History:  Diagnosis Date  . Anemia    past history-many yrs ago  . Anginal pain ([place])    being evaluated by Dr. Tyrone Sage, arm pain,"bad indigestion" -no heart related findings as of yet  . Arthritis    hip. back pain  . Complication of anesthesia    s/p Hysterectomy "vagal response "heart stopped" -did not require shocking.  . Coronary artery disease   . Dry eyes   . Esophageal spasm   . GERD (gastroesophageal reflux disease)   . MS (multiple sclerosis) ([place])    stable-sees [name] every 6 months    Patient Active Problem List   Diagnosis Date Noted  . Asthma exacerbation 10/07/2016  . Tightness in chest 06/01/2015  . Nodule of left lung 06/01/2015  . Pain in the chest   . Leukocytosis 04/25/2015  . Hyperlipidemia LDL goal <70 02/25/2014  . Peripheral neuropathy 02/25/2014  . GERD (gastroesophageal reflux disease) 02/25/2014  . Mild CAD 08/29/2013  . Chest pain 08/29/2013  . Multiple sclerosis ([place]) 08/29/2013  . Sinus tarsi syndrome of right ankle 08/01/2013  . Pes cavus 08/01/2013  . Syncope and collapse 07/02/2011    . Abdominal pain 07/02/2011  . Nausea & vomiting 07/02/2011  . Ovarian cyst 07/02/2011  . Weakness generalized 07/02/2011  . Diarrhea 07/02/2011    Past Surgical History:  Procedure Laterality Date  . ABDOMINAL HYSTERECTOMY    . BLEPHAROPLASTY Bilateral   . CARDIAC CATHETERIZATION  10/04/06   MINOR CAD,SINGLE VESSEL INVOLVING THE CIRCUMFLEX. 20 TO 30% PROXIMALLY AND 10 TO 20% IN THE MIDDLE SEGMENT.MILD MUSCLE BRIDGING, MID LAD.NORMAL RCA.NORMAL LV FUNCTION.NORMAL MITRAL AND AORTIC VALVE.NORMAL APPEARING AORTA,THORACIC AND ABDOMINAL.NORMAL RENAL ARTERIES.  [name] CARDIOLOGY NUCLEAR MED STUDY  06/22/12   NL LV FUNCTION,EF 68%,NL WALL MOTION.  [name] CAROTID DUPLEX  07/02/11   TAV:WPVX SOFT PLAQUE NOTED DISTAL CCA AND ORGIN AND PROXIMAL ICA,LEFT>RIGHT.NO ICA STENOSIS. VERTEBRAL ARTERY FLOW IS ANTEGRADE.  [name] CESAREAN SECTION     x2   . COLONOSCOPY WITH PROPOFOL N/A 12/11/2014   Procedure: COLONOSCOPY WITH PROPOFOL;  Surgeon: Ronald Lobo, MD;  Location: [facility] ENDOSCOPY;  Service: Endoscopy;  Laterality: N/A;  . KNEE ARTHROSCOPY Left    scope  . PARATHYROIDECTOMY     partial-many years ago  . thumb surgery Bilateral    built up and bone removal  . TONSILLECTOMY    . TRANSTHORACIC ECHOCARDIOGRAM  07/02/11   LV CAVITY SIZE IS NORMAL. SYSTOLIC FUNCTION WAS NORMAL.EF=55% TO 60%.INCREASED RELATIVE CONTRIBUTION OF ATRIAL CONTRACTION TO VENTRICULAR FILLING;MAYBE DUE  TO HYPOVOLEMIA. AV=MILD REGURG.    OB History    No data available       Home Medications    Prior to Admission medications   Medication Sig Start Date End Date Taking? Authorizing Provider  amLODipine (NORVASC) 5 MG tablet TAKE 1 TABLET ONCE DAILY. 05/23/16  Yes Troy Sine, MD  aspirin EC 81 MG tablet Take 1 tablet (81 mg total) by mouth daily. 04/16/15  Yes Barrett, Evelene Croon, PA-C  azithromycin (ZITHROMAX Z-PAK) 250 MG tablet Take 2 tablets (500 mg) on  Day 1,  followed by 1 tablet (250 mg) once daily on Days 2 through 5. 10/07/16  10/12/16 Yes Parrett, Tammy S, NP  budesonide-formoterol (SYMBICORT) 160-4.5 MCG/ACT inhaler Inhale 2 puffs into the lungs 2 (two) times daily. 10/07/16  Yes Parrett, Tammy S, NP  carvedilol (COREG) 6.25 MG tablet TAKE 1 TABLET TWICE DAILY WITH FOOD. 07/12/16  Yes Troy Sine, MD  isosorbide mononitrate (IMDUR) 60 MG 24 hr tablet TAKE 1 TABLET ONCE DAILY. 05/16/16  Yes Troy Sine, MD  montelukast (SINGULAIR) 10 MG tablet Take 1 tablet (10 mg total) by mouth at bedtime. 10/07/16  Yes Parrett, Tammy S, NP  ranitidine (ZANTAC) 150 MG tablet Take 1 tablet (150 mg total) by mouth at bedtime. Patient taking differently: Take 150 mg by mouth daily as needed for heartburn.  04/26/15  Yes Barton Dubois, MD  dicyclomine (BENTYL) 10 MG capsule Take 10 mg by mouth daily as needed. 12/28/15 01/27/16  [provider]  gabapentin (NEURONTIN) 300 MG capsule Take 600 mg by mouth 4 (four) times daily as needed (pain).     [provider]  nitroGLYCERIN (NITROSTAT) 0.4 MG SL tablet Place 1 tablet (0.4 mg total) under the tongue every 5 (five) minutes as needed for chest pain. 08/29/13   Troy Sine, MD  ocrelizumab (OCREVUS) 300 MG/10ML injection Inject 600 mg into the vein every 6 (six) months.    [provider]  pantoprazole (PROTONIX) 20 MG tablet Take 20 mg by mouth 2 (two) times daily.    [provider]  PARoxetine (PAXIL) 10 MG tablet Take 5 mg by mouth every morning.     [provider]  predniSONE (DELTASONE) 10 MG tablet 4 tabs for 2 days, then 3 tabs for 2 days, 2 tabs for 2 days, then 1 tab for 2 days, then stop 10/07/16   Parrett, Tammy S, NP  predniSONE (DELTASONE) 20 MG tablet  02/19/16   [provider]  VYTORIN 10-20 MG per tablet Take 1 tablet by mouth daily.  07/30/13   [provider]    Family History Family History  Problem Relation Age of Onset  . Heart disease Mother   . Asthma Father   . Bladder Cancer Father   . Heart  failure Father   . Hyperlipidemia Brother     Social History Social History  Substance Use Topics  . Smoking status: Never Smoker  . Smokeless tobacco: Never Used  . Alcohol use 0.0 oz/week     Comment: wine occ.     Allergies   Crestor [rosuvastatin]; Vicodin [hydrocodone-acetaminophen]; and Isovue [iopamidol]   Review of Systems Review of Systems  All other systems reviewed and are negative.    Physical Exam Updated Vital Signs BP 120/72 (BP Location: Left Arm)   Pulse 74   Temp 98.2 F (36.8 C) (Oral)   Resp 14   SpO2 100%   Physical Exam  Constitutional: She  is oriented to person, place, and time. She appears well-developed and well-nourished.  Non-toxic appearance. No distress.  HENT:  Head: Normocephalic and atraumatic.  Eyes: Pupils are equal, round, and reactive to light. Conjunctivae, EOM and lids are normal.  Neck: Normal range of motion. Neck supple. No tracheal deviation present. No thyroid mass present.  Cardiovascular: Normal rate, regular rhythm and normal heart sounds.  Exam reveals no gallop.   No murmur heard. Pulmonary/Chest: Effort normal. No stridor. No respiratory distress. She has decreased breath sounds in the right lower field and the left lower field. She has no wheezes. She has no rhonchi. She has no rales.  Abdominal: Soft. Normal appearance and bowel sounds are normal. She exhibits no distension. There is no tenderness. There is no rebound and no CVA tenderness.  Musculoskeletal: Normal range of motion. She exhibits no edema or tenderness.  Neurological: She is alert and oriented to person, place, and time. She has normal strength. No cranial nerve deficit or sensory deficit. GCS eye subscore is 4. GCS verbal subscore is 5. GCS motor subscore is 6.  Skin: Skin is warm and dry. No abrasion and no rash noted.  Psychiatric: She has a normal mood and affect. Her speech is normal and behavior is normal.  Nursing note and vitals  reviewed.    ED Treatments / Results  Labs (all labs ordered are listed, but only abnormal results are displayed) Labs Reviewed  CBC WITH DIFFERENTIAL/PLATELET  BASIC METABOLIC PANEL  TROPONIN I    EKG  EKG Interpretation  Date/Time:  Friday October 07 2016 19:47:05 EDT Ventricular Rate:  80 PR Interval:    QRS Duration: 81 QT Interval:  379 QTC Calculation: 438 R Axis:   44 Text Interpretation:  Sinus rhythm Abnormal R-wave progression, early transition Confirmed by Lacretia Leigh ([id number]) on 10/07/2016 8:15:16 PM       Radiology Dg Chest 2 View  Result Date: 10/07/2016 CLINICAL DATA:  Cough, tightness and wheezing x 3 weeks; worsening. Hx of HTN, asthma. EXAM: CHEST  2 VIEW COMPARISON:  05/09/2016 FINDINGS: Midline trachea. Normal heart size and mediastinal contours. No pleural effusion or pneumothorax. Diffuse peribronchial thickening. Mild left base scarring remains. IMPRESSION: No acute cardiopulmonary disease. Pulmonary interstitial prominence, likely related to the clinical history of asthma. Electronically Signed   By: Abigail Miyamoto M.D.   On: 10/07/2016 15:24    Procedures Procedures (including critical care time)  Medications Ordered in ED Medications  0.9 %  sodium chloride infusion (not administered)  0.9 %  sodium chloride infusion (not administered)  albuterol (PROVENTIL,VENTOLIN) solution continuous neb (not administered)  benzonatate (TESSALON) capsule 200 mg (not administered)  methylPREDNISolone sodium succinate (SOLU-MEDROL) 125 mg/2 mL injection 125 mg (not administered)  albuterol (PROVENTIL) (2.5 MG/3ML) 0.083% nebulizer solution 5 mg (5 mg Nebulization Given 10/07/16 2005)     Initial Impression / Assessment and Plan / ED Course  I have reviewed the triage vital signs and the nursing notes.  Pertinent labs & imaging results that were available during my care of the patient were reviewed by me and considered in my medical decision making (see chart  for details).     Patient given albuterol 10 mg and feels much better. Also given Tessalon Perles and cough is subsided. She notes that she was only dyspneic with coughing and does not feel dyspneic at this time. Wheezing is much improved. Low suspicion for pulmonary embolism. Patient will continue her home steroids and inhalers  Final Clinical Impressions(s) /  ED Diagnoses   Final diagnoses:  SOB (shortness of breath)    New Prescriptions New Prescriptions   No medications on file     Lacretia Leigh, MD 10/07/16 [id number]

## 2016-10-07 NOTE — Progress Notes (Signed)
Spoke with pt about cxr results. Pt expressed understanding. Nothing further needed at this time.

## 2016-10-07 NOTE — ED Triage Notes (Signed)
Pt presents d/t persistent cough.  Per pt she has had the cough for, "Weeks."  Pt reports that the cough got significantly worse today and was seen at [place] office to day and given a steroid injection, breathing tx, chest x-ray and started on a z-pack.  Pt also c/o generalized CP, worse w/ cough.

## 2016-10-10 LAB — RESPIRATORY ALLERGY PROFILE REGION II ~~LOC~~
Allergen, C. Herbarum, M2: <0.1 kU/L
Allergen, Comm Silver Birch, t9: <0.1 kU/L
Allergen, D pternoyssinus,d7: <0.1 kU/L
Allergen, Mulberry, t76: <0.1 kU/L
Allergen, P. notatum, m1: <0.1 kU/L
Aspergillus fumigatus, m3: <0.1 kU/L
Bermuda Grass: <0.1 kU/L
Box Elder IgE: <0.1 kU/L
Cockroach: <0.1 kU/L
Common Ragweed: <0.1 kU/L
D. farinae: <0.1 kU/L
Dog Dander: <0.1 kU/L
IgE (Immunoglobulin E), Serum: 4 kU/L (ref <115)
Johnson Grass: <0.1 kU/L
Sheep Sorrel IgE: <0.1 kU/L

## 2016-10-10 NOTE — Progress Notes (Signed)
LMOM TCB x1 to check on patient - do see where she went to the ED on 7/13 as advised by TP.

## 2016-10-11 ENCOUNTER — Telehealth: Payer: Self-pay | Admitting: Internal Medicine

## 2016-10-11 ENCOUNTER — Encounter (HOSPITAL_BASED_OUTPATIENT_CLINIC_OR_DEPARTMENT_OTHER): Payer: Self-pay | Admitting: *Deleted

## 2016-10-11 ENCOUNTER — Emergency Department (HOSPITAL_BASED_OUTPATIENT_CLINIC_OR_DEPARTMENT_OTHER): Payer: Medicare Other

## 2016-10-11 ENCOUNTER — Emergency Department (HOSPITAL_BASED_OUTPATIENT_CLINIC_OR_DEPARTMENT_OTHER)
Admission: EM | Admit: 2016-10-11 | Discharge: 2016-10-11 | Disposition: A | Discharge status: Home / Self Care | Payer: Medicare Other | Attending: Emergency Medicine | Admitting: Emergency Medicine

## 2016-10-11 DIAGNOSIS — W228XXA Striking against or struck by other objects, initial encounter: Secondary | ICD-10-CM | POA: Diagnosis not present

## 2016-10-11 DIAGNOSIS — Y929 Unspecified place or not applicable: Secondary | ICD-10-CM | POA: Insufficient documentation

## 2016-10-11 DIAGNOSIS — Z7982 Long term (current) use of aspirin: Secondary | ICD-10-CM | POA: Diagnosis not present

## 2016-10-11 DIAGNOSIS — S9031XA Contusion of right foot, initial encounter: Secondary | ICD-10-CM | POA: Insufficient documentation

## 2016-10-11 DIAGNOSIS — Y939 Activity, unspecified: Secondary | ICD-10-CM | POA: Diagnosis not present

## 2016-10-11 DIAGNOSIS — Z79899 Other long term (current) drug therapy: Secondary | ICD-10-CM | POA: Insufficient documentation

## 2016-10-11 DIAGNOSIS — S99921A Unspecified injury of right foot, initial encounter: Secondary | ICD-10-CM | POA: Diagnosis present

## 2016-10-11 DIAGNOSIS — I2510 Atherosclerotic heart disease of native coronary artery without angina pectoris: Secondary | ICD-10-CM | POA: Insufficient documentation

## 2016-10-11 DIAGNOSIS — Y999 Unspecified external cause status: Secondary | ICD-10-CM | POA: Diagnosis not present

## 2016-10-11 MED ORDER — IBUPROFEN 400 MG PO TABS
600.0000 mg | ORAL_TABLET | Freq: Once | ORAL | Status: AC
  Administered 2016-10-11: 600 mg via ORAL
  Filled 2016-10-11: qty 1

## 2016-10-11 NOTE — ED Triage Notes (Signed)
Injury to her right foot. She kicked a piece of furniture by accident.

## 2016-10-11 NOTE — ED Notes (Signed)
Pt wheel out in chair

## 2016-10-11 NOTE — Telephone Encounter (Signed)
Phone trouble in triage, phone ringing a fast busy signal, tried all three phones   Notes recorded by Rinaldo Ratel, CMA on 10/10/2016 at 10:36 AM EDT LMOM TCB x1 to check on patient - do see where she went to the ED on 7/13 as advised by TP. ------  Notes recorded by Melvenia Needles, NP on 10/07/2016 at 6:02 PM EDT Called pt and advised of labs  She is still coughing and wheezing despite neb tx , symbicort , depo medrol shot , and combivent . [name] Advised that she should go to ER for evluation . Daughter is with her and pt is aggreement .  Please contact office for sooner follow up if symptoms do not improve or worsen or seek emergency care

## 2016-10-11 NOTE — ED Provider Notes (Signed)
[facility] DEPT [facility] Provider Note   CSN: [id number] Arrival date & time: 10/11/16  2152  By signing my name below, I, [name], attest that this documentation has been prepared under the direction and in the presence of Sherwood Gambler, MD. Electronically Signed: Theresia [name], ED Scribe. 10/11/16. 10:40 PM.  History   Chief Complaint Chief Complaint  Patient presents with  . Foot Injury   The history is provided by the patient. No language interpreter was used.   HPI Comments: [name] is a 71 y.o. female who presents to the Emergency Department complaining of moderate, sudden onset right foot pain onset 90 minutes ago. Pt states she kicked the sofa accidentally with her bare foot. Pt states the pain is mostly on the lateral side and to the lateral toes of the foot. Pt was able to walk with support from a cane. Pt reports associated swelling to the area. No treatments tried PTA. Pt denies numbness, weakness or any other complaints at this time.   Past Medical History:  Diagnosis Date  . Anemia    past history-many yrs ago  . Anginal pain ([place])    being evaluated by Dr. Tyrone Sage, arm pain,"bad indigestion" -no heart related findings as of yet  . Arthritis    hip. back pain  . Complication of anesthesia    s/p Hysterectomy "vagal response "heart stopped" -did not require shocking.  . Coronary artery disease   . Dry eyes   . Esophageal spasm   . GERD (gastroesophageal reflux disease)   . MS (multiple sclerosis) ([place])    stable-sees [name] every 6 months    Patient Active Problem List   Diagnosis Date Noted  . Asthma exacerbation 10/07/2016  . Tightness in chest 06/01/2015  . Nodule of left lung 06/01/2015  . Pain in the chest   . Leukocytosis 04/25/2015  . Hyperlipidemia LDL goal <70 02/25/2014  . Peripheral neuropathy 02/25/2014  . GERD (gastroesophageal reflux disease) 02/25/2014  . Mild CAD 08/29/2013  . Chest pain 08/29/2013  . Multiple  sclerosis ([place]) 08/29/2013  . Sinus tarsi syndrome of right ankle 08/01/2013  . Pes cavus 08/01/2013  . Syncope and collapse 07/02/2011  . Abdominal pain 07/02/2011  . Nausea & vomiting 07/02/2011  . Ovarian cyst 07/02/2011  . Weakness generalized 07/02/2011  . Diarrhea 07/02/2011    Past Surgical History:  Procedure Laterality Date  . ABDOMINAL HYSTERECTOMY    . BLEPHAROPLASTY Bilateral   . CARDIAC CATHETERIZATION  10/04/06   MINOR CAD,SINGLE VESSEL INVOLVING THE CIRCUMFLEX. 20 TO 30% PROXIMALLY AND 10 TO 20% IN THE MIDDLE SEGMENT.MILD MUSCLE BRIDGING, MID LAD.NORMAL RCA.NORMAL LV FUNCTION.NORMAL MITRAL AND AORTIC VALVE.NORMAL APPEARING AORTA,THORACIC AND ABDOMINAL.NORMAL RENAL ARTERIES.  [name] CARDIOLOGY NUCLEAR MED STUDY  06/22/12   NL LV FUNCTION,EF 68%,NL WALL MOTION.  [name] CAROTID DUPLEX  07/02/11   DQQ:IWLN SOFT PLAQUE NOTED DISTAL CCA AND ORGIN AND PROXIMAL ICA,LEFT>RIGHT.NO ICA STENOSIS. VERTEBRAL ARTERY FLOW IS ANTEGRADE.  [name] CESAREAN SECTION     x2   . COLONOSCOPY WITH PROPOFOL N/A 12/11/2014   Procedure: COLONOSCOPY WITH PROPOFOL;  Surgeon: Ronald Lobo, MD;  Location: [facility] ENDOSCOPY;  Service: Endoscopy;  Laterality: N/A;  . KNEE ARTHROSCOPY Left    scope  . PARATHYROIDECTOMY     partial-many years ago  . thumb surgery Bilateral    built up and bone removal  . TONSILLECTOMY    . TRANSTHORACIC ECHOCARDIOGRAM  07/02/11   LV CAVITY SIZE IS NORMAL. SYSTOLIC FUNCTION WAS NORMAL.EF=55% TO 60%.INCREASED  RELATIVE CONTRIBUTION OF ATRIAL CONTRACTION TO VENTRICULAR FILLING;MAYBE DUE TO HYPOVOLEMIA. AV=MILD REGURG.    OB History    No data available       Home Medications    Prior to Admission medications   Medication Sig Start Date End Date Taking? Authorizing Provider  amLODipine (NORVASC) 5 MG tablet TAKE 1 TABLET ONCE DAILY. 05/23/16   Troy Sine, MD  aspirin EC 81 MG tablet Take 1 tablet (81 mg total) by mouth daily. 04/16/15   Barrett, Evelene Croon, PA-C  azithromycin (ZITHROMAX  Z-PAK) 250 MG tablet Take 2 tablets (500 mg) on  Day 1,  followed by 1 tablet (250 mg) once daily on Days 2 through 5. 10/07/16 10/12/16  Parrett, Fonnie Mu, NP  benzonatate (TESSALON) 100 MG capsule Take 1 capsule (100 mg total) by mouth every 8 (eight) hours. 10/07/16   Lacretia Leigh, MD  budesonide-formoterol [facility]) 160-4.5 MCG/ACT inhaler Inhale 2 puffs into the lungs 2 (two) times daily. 10/07/16   Parrett, Fonnie Mu, NP  carvedilol (COREG) 6.25 MG tablet TAKE 1 TABLET TWICE DAILY WITH FOOD. 07/12/16   Troy Sine, MD  dicyclomine (BENTYL) 10 MG capsule Take 10 mg by mouth daily as needed. 12/28/15 01/27/16  [provider]  gabapentin (NEURONTIN) 300 MG capsule Take 600 mg by mouth 4 (four) times daily as needed (pain).     [provider]  isosorbide mononitrate (IMDUR) 60 MG 24 hr tablet TAKE 1 TABLET ONCE DAILY. 05/16/16   Troy Sine, MD  montelukast (SINGULAIR) 10 MG tablet Take 1 tablet (10 mg total) by mouth at bedtime. 10/07/16   Parrett, Fonnie Mu, NP  nitroGLYCERIN (NITROSTAT) 0.4 MG SL tablet Place 1 tablet (0.4 mg total) under the tongue every 5 (five) minutes as needed for chest pain. 08/29/13   Troy Sine, MD  ocrelizumab (OCREVUS) 300 MG/10ML injection Inject 600 mg into the vein every 6 (six) months.    [provider]  pantoprazole (PROTONIX) 20 MG tablet Take 20 mg by mouth every other day.     [provider]  PARoxetine (PAXIL) 10 MG tablet Take 5 mg by mouth every morning.     [provider]  predniSONE (DELTASONE) 10 MG tablet 4 tabs for 2 days, then 3 tabs for 2 days, 2 tabs for 2 days, then 1 tab for 2 days, then stop 10/07/16   Parrett, Tammy S, NP  ranitidine (ZANTAC) 150 MG tablet Take 1 tablet (150 mg total) by mouth at bedtime. Patient taking differently: Take 150 mg by mouth daily as needed for heartburn.  04/26/15   Barton Dubois, MD  VYTORIN 10-20 MG per tablet Take 1 tablet by mouth daily.  07/30/13   [provider]    Family History Family History  Problem Relation Age of Onset  . Heart disease Mother   . Asthma Father   . Bladder Cancer Father   . Heart failure Father   . Hyperlipidemia Brother     Social History Social History  Substance Use Topics  . Smoking status: Never Smoker  . Smokeless tobacco: Never Used  . Alcohol use 0.0 oz/week     Comment: wine occ.     Allergies   Crestor [rosuvastatin]; Vicodin [hydrocodone-acetaminophen]; and Isovue [iopamidol]   Review of Systems Review of Systems  Musculoskeletal: Positive for arthralgias, joint swelling and myalgias.  Neurological: Negative for weakness and numbness.     Physical Exam Updated Vital Signs BP (!) 153/81  Pulse 70   Temp 98.8 F (37.1 C) (Oral)   Resp 20   Ht 5\' 2"  (1.575 m)   Wt 69.9 kg (154 lb)   SpO2 97%   BMI 28.17 kg/m   Physical Exam  Constitutional: She is oriented to person, place, and time. She appears well-developed and well-nourished.  HENT:  Head: Normocephalic and atraumatic.  Right Ear: External ear normal.  Left Ear: External ear normal.  Nose: Nose normal.  Eyes: Right eye exhibits no discharge. Left eye exhibits no discharge.  Cardiovascular: Normal rate and regular rhythm.   Pulmonary/Chest: Effort normal.  Musculoskeletal: She exhibits edema and tenderness. She exhibits no deformity.  Right foot: focal swelling and tenderness to the 5th digit as well as the MTP of the 5th digit, otherwise no swelling or tenderness including no plantar, heel, achilles tenderness. Normal strength and sensation. 2+ DP pulse.   Neurological: She is alert and oriented to person, place, and time.  Skin: Skin is warm and dry.  Nursing note and vitals reviewed.    ED Treatments / Results  DIAGNOSTIC STUDIES: Oxygen Saturation is 97% on RA, normal by my interpretation.   COORDINATION OF CARE: 10:38 PM-Discussed next steps with pt including RICE protocol. Pt verbalized  understanding and is agreeable with the plan.   Labs (all labs ordered are listed, but only abnormal results are displayed) Labs Reviewed - No data to display  EKG  EKG Interpretation None       Radiology Dg Foot Complete Right  Result Date: 10/11/2016 CLINICAL DATA:  71 year old female with blunt trauma to the right foot. EXAM: RIGHT FOOT COMPLETE - 3+ VIEW COMPARISON:  None. FINDINGS: There is no acute fracture or dislocation. Faint bone densities adjacent to the distal phalanx of the fifth digit appear chronic. No significant arthritic changes. Mild soft tissue swelling of the toes. No radiopaque foreign object. IMPRESSION: No acute fracture or dislocation. Electronically Signed   By: Anner Crete M.D.   On: 10/11/2016 22:39    Procedures Procedures (including critical care time)  Medications Ordered in ED Medications  ibuprofen (ADVIL,MOTRIN) tablet 600 mg (600 mg Oral Given 10/11/16 2220)     Initial Impression / Assessment and Plan / ED Course  I have reviewed the triage vital signs and the nursing notes.  Pertinent labs & imaging results that were available during my care of the patient were reviewed by me and considered in my medical decision making (see chart for details).     X-ray shows no acute fracture or dislocation. Overall this is most likely a contusion versus sprain. She'll be treated with RICE, postop shoe for extra support. At this point she appears stable for discharge to follow up with her PCP. Discussed return precautions. WBAT  Final Clinical Impressions(s) / ED Diagnoses   Final diagnoses:  Contusion of right foot, initial encounter    New Prescriptions New Prescriptions   No medications on file   I personally performed the services described in this documentation, which was scribed in my presence. The recorded information has been reviewed and is accurate.     Sherwood Gambler, MD 10/11/16 [phone]

## 2016-10-11 NOTE — ED Notes (Signed)
Patient transported to X-ray 

## 2016-10-12 NOTE — Telephone Encounter (Signed)
Attempted to contact pt. We seem to still be having issues with calling certain numbers. Will try back later.

## 2016-10-18 ENCOUNTER — Ambulatory Visit
Admission: RE | Admit: 2016-10-18 | Discharge: 2016-10-18 | Disposition: A | Discharge status: Home / Self Care | Payer: Medicare Other | Source: Ambulatory Visit | Attending: Endocrinology | Admitting: Endocrinology

## 2016-10-18 DIAGNOSIS — E041 Nontoxic single thyroid nodule: Secondary | ICD-10-CM

## 2016-10-24 NOTE — Progress Notes (Signed)
Called spoke with patient, advised of lab results / recs as stated by TP.  Pt verbalized her understanding and denied any questions. 

## 2016-11-10 ENCOUNTER — Encounter: Payer: Self-pay | Admitting: Adult Health

## 2016-11-10 ENCOUNTER — Ambulatory Visit (INDEPENDENT_AMBULATORY_CARE_PROVIDER_SITE_OTHER): Payer: Medicare Other | Admitting: Adult Health

## 2016-11-10 DIAGNOSIS — J45901 Unspecified asthma with (acute) exacerbation: Secondary | ICD-10-CM

## 2016-11-10 LAB — NITRIC OXIDE: NITRIC OXIDE: 150

## 2016-11-10 MED ORDER — CETIRIZINE HCL 10 MG PO TABS: 10.0000 mg | ORAL_TABLET | Freq: Every day | ORAL | 5 refills | Status: DC

## 2016-11-10 MED ORDER — LEVALBUTEROL HCL 0.63 MG/3ML IN NEBU
0.6300 mg | INHALATION_SOLUTION | Freq: Once | RESPIRATORY_TRACT | Status: AC
  Administered 2016-11-10: 0.63 mg via RESPIRATORY_TRACT

## 2016-11-10 NOTE — Addendum Note (Signed)
Addended by: Parke Poisson E on: 11/10/2016 05:31 PM   Modules accepted: Orders

## 2016-11-10 NOTE — Addendum Note (Signed)
Addended by: Parke Poisson E on: 11/10/2016 03:52 PM   Modules accepted: Orders

## 2016-11-10 NOTE — Patient Instructions (Addendum)
Restart Zyrtec 10mg  At bedtime   Restart  Singulair 10mg  At bedtime   Continue on Symbicort 160 2 puffs Twice daily  , rinse after use.  follow up [name] in 4 -6 weeks with PFT   Please contact office for sooner follow up if symptoms do not improve or worsen or seek emergency care

## 2016-11-10 NOTE — Assessment & Plan Note (Signed)
Recent flare -improved on regimen and steroids  Sx returning off singulair and zyrtec -will restart  xopenex neb given in office   Plan  Patient Instructions  Restart Zyrtec 10mg  At bedtime   Restart  Singulair 10mg  At bedtime   Continue on Symbicort 160 2 puffs Twice daily  , rinse after use.  follow up [name] in 4 -6 weeks with PFT   Please contact office for sooner follow up if symptoms do not improve or worsen or seek emergency care

## 2016-11-10 NOTE — Addendum Note (Signed)
Addended by: Parke Poisson E on: 11/10/2016 05:26 PM   Modules accepted: Orders

## 2016-11-10 NOTE — Progress Notes (Signed)
@Patient  ID: [name], female    DOB: 07/17/1945, 71 y.o.   MRN: [id number]  Chief Complaint  Patient presents with  . Acute Visit    Cough     Referring provider: [name], MD  HPI: 71 year old female never smoker followed for asthma  and lung nodule  11/10/2016 Follow up : Asthma  Pt returns for 1 month follow up . She was treated for Asthma exacerbation last ov with steroid taper , started back on Symbicort and Singulair  .Exhaled nitric oxide last ov was 180.  She is feeling better. Symptoms return to normal w/ resolution of wheezing. However  Noticed over last week , some intermittent wheezing if she exhales hard . Mild intermittent cough , that is mainly dry. She says she stopped taking her singulair and zyrtec couple of weeks ago, Stopped bc she felt better.  Exhaled nitric oxide today is 150.  She denies any chest pain, orthopnea, PND, or increased leg swelling Chest x-ray last month was clear.   Allergies  Allergen Reactions  . Crestor [Rosuvastatin] Other (See Comments)    UNSPECIFIED   . Vicodin [Hydrocodone-Acetaminophen]   . Isovue [Iopamidol]     Pt had sneezing and itching of her throat and soft palate.  Dr Alvester Chou checked pt.  She will need premeds in the future.  J Bohm    Immunization History  Administered Date(s) Administered  . Influenza, High Dose Seasonal PF 12/22/2013, 12/14/2015  . Influenza,inj,Quad PF,36+ Mos 11/27/2014  . Pneumococcal-Unspecified 10/26/2013    Past Medical History:  Diagnosis Date  . Anemia    past history-many yrs ago  . Anginal pain ([place])    being evaluated by Dr. Tyrone Sage, arm pain,"bad indigestion" -no heart related findings as of yet  . Arthritis    hip. back pain  . Complication of anesthesia    s/p Hysterectomy "vagal response "heart stopped" -did not require shocking.  . Coronary artery disease   . Dry eyes   . Esophageal spasm   . GERD (gastroesophageal reflux disease)   . MS (multiple sclerosis)  ([place])    stable-sees [name] every 6 months    Tobacco History: History  Smoking Status  . Never Smoker  Smokeless Tobacco  . Never Used   Counseling given: Not Answered   Outpatient Encounter Prescriptions as of 11/10/2016  Medication Sig  . amLODipine (NORVASC) 5 MG tablet TAKE 1 TABLET ONCE DAILY.  [name] aspirin EC 81 MG tablet Take 1 tablet (81 mg total) by mouth daily.  . benzonatate (TESSALON) 100 MG capsule Take 1 capsule (100 mg total) by mouth every 8 (eight) hours.  . budesonide-formoterol (SYMBICORT) 160-4.5 MCG/ACT inhaler Inhale 2 puffs into the lungs 2 (two) times daily.  . carvedilol (COREG) 6.25 MG tablet TAKE 1 TABLET TWICE DAILY WITH FOOD.  [name] gabapentin (NEURONTIN) 300 MG capsule Take 600 mg by mouth 4 (four) times daily as needed (pain).   . isosorbide mononitrate (IMDUR) 60 MG 24 hr tablet TAKE 1 TABLET ONCE DAILY.  . montelukast (SINGULAIR) 10 MG tablet Take 1 tablet (10 mg total) by mouth at bedtime.  . nitroGLYCERIN (NITROSTAT) 0.4 MG SL tablet Place 1 tablet (0.4 mg total) under the tongue every 5 (five) minutes as needed for chest pain.  [name] ocrelizumab (OCREVUS) 300 MG/10ML injection Inject 600 mg into the vein every 6 (six) months.  . pantoprazole (PROTONIX) 20 MG tablet Take 20 mg by mouth every other day.   [name] PARoxetine (PAXIL) 10 MG  tablet Take 5 mg by mouth every morning.   . ranitidine (ZANTAC) 150 MG tablet Take 1 tablet (150 mg total) by mouth at bedtime. (Patient taking differently: Take 150 mg by mouth daily as needed for heartburn. )  . VYTORIN 10-20 MG per tablet Take 1 tablet by mouth daily.   [name] dicyclomine (BENTYL) 10 MG capsule Take 10 mg by mouth daily as needed.  . [DISCONTINUED] predniSONE (DELTASONE) 10 MG tablet 4 tabs for 2 days, then 3 tabs for 2 days, 2 tabs for 2 days, then 1 tab for 2 days, then stop (Patient not taking: Reported on 11/10/2016)   No facility-administered encounter medications on file as of 11/10/2016.      Review of  Systems  Constitutional:   No  weight loss, night sweats,  Fevers, chills, fatigue, or  lassitude.  HEENT:   No headaches,  Difficulty swallowing,  Tooth/dental problems, or  Sore throat,                No sneezing, itching, ear ache,  +nasal congestion, post nasal drip,   CV:  No chest pain,  Orthopnea, PND, swelling in lower extremities, anasarca, dizziness, palpitations, syncope.   GI  No heartburn, indigestion, abdominal pain, nausea, vomiting, diarrhea, change in bowel habits, loss of appetite, bloody stools.   Resp:    No chest wall deformity  Skin: no rash or lesions.  GU: no dysuria, change in color of urine, no urgency or frequency.  No flank pain, no hematuria   MS:  No joint pain or swelling.  No decreased range of motion.  No back pain.    Physical Exam  BP 126/64 (BP Location: Left Arm, Cuff Size: Normal)   Pulse 62   Ht 5\' 3"  (1.6 m)   Wt 155 lb (70.3 kg)   SpO2 95%   BMI 27.46 kg/m   GEN: A/Ox3; pleasant , NAD, well nourished    HEENT:  [place]/AT,  EACs-clear, TMs-wnl, NOSE-clear, THROAT-clear, no lesions, no postnasal drip or exudate noted.   NECK:  Supple w/ fair ROM; no JVD; normal carotid impulses w/o bruits; no thyromegaly or nodules palpated; no lymphadenopathy.    RESP  Faint exp wheeze on forced expiration,  no accessory muscle use, no dullness to percussion, speaks in full sentences.   CARD:  RRR, no m/r/g, no peripheral edema, pulses intact, no cyanosis or clubbing.  GI:   Soft & nt; nml bowel sounds; no organomegaly or masses detected.   Musco: Warm bil, no deformities or joint swelling noted.   Neuro: alert, no focal deficits noted.    Skin: Warm, no lesions or rashes    Lab Results:  CBC    Component Value Date/Time   WBC 6.0 10/07/2016 2051   RBC 4.46 10/07/2016 2051   HGB 13.7 10/07/2016 2051   HCT 39.5 10/07/2016 2051   PLT 230 10/07/2016 2051   MCV 88.6 10/07/2016 2051   MCH 30.7 10/07/2016 2051   MCHC 34.7 10/07/2016 2051    RDW 12.6 10/07/2016 2051   LYMPHSABS 1.5 10/07/2016 2051   MONOABS 0.8 10/07/2016 2051   EOSABS 0.3 10/07/2016 2051   BASOSABS 0.0 10/07/2016 2051    BMET    Component Value Date/Time   NA 141 10/07/2016 2051   K 4.0 10/07/2016 2051   CL 108 10/07/2016 2051   CO2 23 10/07/2016 2051   GLUCOSE 128 (H) 10/07/2016 2051   BUN 16 10/07/2016 2051   CREATININE 0.77 10/07/2016 2051  CALCIUM 9.2 10/07/2016 2051   GFRNONAA >60 10/07/2016 2051   GFRAA >60 10/07/2016 2051    BNP No results found for: BNP  ProBNP No results found for: PROBNP  Imaging: Dg Foot Complete Right  Result Date: 10/11/2016 CLINICAL DATA:  71 year old female with blunt trauma to the right foot. EXAM: RIGHT FOOT COMPLETE - 3+ VIEW COMPARISON:  None. FINDINGS: There is no acute fracture or dislocation. Faint bone densities adjacent to the distal phalanx of the fifth digit appear chronic. No significant arthritic changes. Mild soft tissue swelling of the toes. No radiopaque foreign object. IMPRESSION: No acute fracture or dislocation. Electronically Signed   By: Anner Crete M.D.   On: 10/11/2016 22:39   US Thyroid  Result Date: 10/18/2016 CLINICAL DATA:  Prior ultrasound follow-up. History of radiation therapy. History of prior right-sided thyroid nodule biopsy EXAM: THYROID ULTRASOUND TECHNIQUE: Ultrasound examination of the thyroid gland and adjacent soft tissues was performed. COMPARISON:  09/15/2015; ultrasound-guided right-sided thyroid nodule fine-needle aspiration - 11/26/2015 FINDINGS: Parenchymal Echotexture: Mildly heterogenous Isthmus: Normal in size measures 0.2 cm in diameter, unchanged Right lobe: Normal in size measuring 4.3 x 1.8 x 1.9 cm, unchanged, previously, 4.2 x 1.4 x 2.2 cm Left lobe: Normal in size measuring 4.3 x 1.5 x 1.4 cm, unchanged, previously, 4.2 x 1.3 x 1.3 cm _________________________________________________________ Estimated total number of nodules >/= 1 cm: 1 Number of spongiform  nodules >/=  2 cm not described below (TR1): 0 Number of mixed cystic and solid nodules >/= 1.5 cm not described below (TR2): 0 _________________________________________________________ The previously biopsied approximately 1.0 x 0.8 x 0.6 cm spongiform appearing nodule within the inferior pole of the right lobe of the thyroid is unchanged to decreased in size the interval, previously, 1.2 x 1.0 x 0.5 cm. Correlation with prior biopsy results is recommended. Note is made of a punctate (approximately 0.7 cm) spongiform appearing nodule within the inferior pole of the right lobe of the thyroid which does not meet imaging criteria to recommend percutaneous sampling or continued dedicated follow-up. There is an approximately 1.4 x 1.0 x 0.8 cm anechoic cyst within the mid aspect the left lobe of the thyroid which is again noted to contain an eccentric echogenic foci with ring down artifact suggestive of colloid and is is unchanged, previously, 1.4 x 1.0 x 0.9 cm, and does not meet imaging criteria to recommend percutaneous sampling or continued dedicated follow-up. IMPRESSION: 1. Similar findings of multinodular goiter. No new or enlarging thyroid nodules. 2. The previously biopsied approximately 1.0 cm spongiform nodule within the right lobe of the thyroid is unchanged to decreased in size compared to the 08/2015 examination, previously, 1.2 cm. Assuming benign pathologic diagnosis, repeat sampling and/or continued dedicated follow-up is not recommended. The above is in keeping with the ACR TI-RADS recommendations - J Am Coll Radiol 2017;14:587-595. Electronically Signed   By: Sandi Mariscal M.D.   On: 10/18/2016 14:59     Assessment & Plan:   Asthma exacerbation Recent flare -improved on regimen and steroids  Sx returning off singulair and zyrtec -will restart  xopenex neb given in office   Plan  Patient Instructions  Restart Zyrtec 10mg  At bedtime   Restart  Singulair 10mg  At bedtime   Continue on  Symbicort 160 2 puffs Twice daily  , rinse after use.  follow up [name] in 4 -6 weeks with PFT   Please contact office for sooner follow up if symptoms do not improve or worsen or seek emergency  care         Rexene Edison, NP 11/10/2016

## 2016-11-25 ENCOUNTER — Encounter: Payer: Self-pay | Admitting: Adult Health

## 2016-11-25 ENCOUNTER — Ambulatory Visit (INDEPENDENT_AMBULATORY_CARE_PROVIDER_SITE_OTHER): Payer: Medicare Other | Admitting: Adult Health

## 2016-11-25 DIAGNOSIS — J45901 Unspecified asthma with (acute) exacerbation: Secondary | ICD-10-CM | POA: Diagnosis not present

## 2016-11-25 MED ORDER — PREDNISONE 10 MG PO TABS: ORAL_TABLET | ORAL | 0 refills | Status: DC

## 2016-11-25 MED ORDER — AZITHROMYCIN 250 MG PO TABS: ORAL_TABLET | ORAL | 0 refills | Status: AC

## 2016-11-25 NOTE — Progress Notes (Signed)
@Patient  ID: [name], female    DOB: [birth year], 71 y.o.   MRN: [id number]  Chief Complaint  Patient presents with  . Acute Visit    Asthma     Referring provider: [name], MD  HPI: 71 year old female never smoker followed for asthma in the lung nodule. MS on Infusion -OCREVUS every 6 months    11/25/2016 Acute OV : Asthma  Patient presents for an acute office visit. Patient complains of 3-4 days of cough , congestion , wheezing and tightness. Coughing up thick yellow mucus .  Hard to cough up thick mucus .  She is taking mucinex  She denies hemoptysis , chest pain, orthopnea, edema.   Allergies  Allergen Reactions  . Crestor [Rosuvastatin] Other (See Comments)    UNSPECIFIED   . Vicodin [Hydrocodone-Acetaminophen]   . Isovue [Iopamidol]     Pt had sneezing and itching of her throat and soft palate.  Dr Alvester Chou checked pt.  She will need premeds in the future.  J Bohm    Immunization History  Administered Date(s) Administered  . Influenza, High Dose Seasonal PF 12/22/2013, 12/14/2015  . Influenza,inj,Quad PF,6+ Mos 11/27/2014  . Pneumococcal-Unspecified 10/26/2013    Past Medical History:  Diagnosis Date  . Anemia    past history-many yrs ago  . Anginal pain ([place])    being evaluated by Dr. Tyrone Sage, arm pain,"bad indigestion" -no heart related findings as of yet  . Arthritis    hip. back pain  . Complication of anesthesia    s/p Hysterectomy "vagal response "heart stopped" -did not require shocking.  . Coronary artery disease   . Dry eyes   . Esophageal spasm   . GERD (gastroesophageal reflux disease)   . MS (multiple sclerosis) ([place])    stable-sees [name] every 6 months    Tobacco History: History  Smoking Status  . Never Smoker  Smokeless Tobacco  . Never Used   Counseling given: Not Answered   Outpatient Encounter Prescriptions as of 11/25/2016  Medication Sig  . amLODipine (NORVASC) 5 MG tablet TAKE 1 TABLET ONCE DAILY.  [name]  aspirin EC 81 MG tablet Take 1 tablet (81 mg total) by mouth daily.  . benzonatate (TESSALON) 100 MG capsule Take 1 capsule (100 mg total) by mouth every 8 (eight) hours.  . budesonide-formoterol (SYMBICORT) 160-4.5 MCG/ACT inhaler Inhale 2 puffs into the lungs 2 (two) times daily.  . carvedilol (COREG) 6.25 MG tablet TAKE 1 TABLET TWICE DAILY WITH FOOD.  [name] cetirizine (ZYRTEC ALLERGY) 10 MG tablet Take 1 tablet (10 mg total) by mouth daily.  [name] gabapentin (NEURONTIN) 300 MG capsule Take 600 mg by mouth 4 (four) times daily as needed (pain).   . isosorbide mononitrate (IMDUR) 60 MG 24 hr tablet TAKE 1 TABLET ONCE DAILY.  . montelukast (SINGULAIR) 10 MG tablet Take 1 tablet (10 mg total) by mouth at bedtime.  . nitroGLYCERIN (NITROSTAT) 0.4 MG SL tablet Place 1 tablet (0.4 mg total) under the tongue every 5 (five) minutes as needed for chest pain.  [name] ocrelizumab (OCREVUS) 300 MG/10ML injection Inject 600 mg into the vein every 6 (six) months.  . pantoprazole (PROTONIX) 20 MG tablet Take 20 mg by mouth every other day.   [name] PARoxetine (PAXIL) 10 MG tablet Take 5 mg by mouth every morning.   . ranitidine (ZANTAC) 150 MG tablet Take 1 tablet (150 mg total) by mouth at bedtime. (Patient taking differently: Take 150 mg by mouth daily as needed for heartburn. )  .  VYTORIN 10-20 MG per tablet Take 1 tablet by mouth daily.   [name] azithromycin (ZITHROMAX Z-PAK) 250 MG tablet Take 2 tablets (500 mg) on  Day 1,  followed by 1 tablet (250 mg) once daily on Days 2 through 5.  . dicyclomine (BENTYL) 10 MG capsule Take 10 mg by mouth daily as needed.  . predniSONE (DELTASONE) 10 MG tablet 4 tabs for 2 days, then 3 tabs for 2 days, 2 tabs for 2 days, then 1 tab for 2 days, then stop   No facility-administered encounter medications on file as of 11/25/2016.      Review of Systems  Constitutional:   No  weight loss, night sweats,  Fevers, chills, fatigue, or  lassitude.  HEENT:   No headaches,  Difficulty  swallowing,  Tooth/dental problems, or  Sore throat,                No sneezing, itching, ear ache,  +nasal congestion, post nasal drip,   CV:  No chest pain,  Orthopnea, PND, swelling in lower extremities, anasarca, dizziness, palpitations, syncope.   GI  No heartburn, indigestion, abdominal pain, nausea, vomiting, diarrhea, change in bowel habits, loss of appetite, bloody stools.   Resp:  .  No chest wall deformity  Skin: no rash or lesions.  GU: no dysuria, change in color of urine, no urgency or frequency.  No flank pain, no hematuria   MS:  No joint pain or swelling.  No decreased range of motion.  No back pain.    Physical Exam  BP 134/66 (BP Location: Left Arm, Cuff Size: Normal)   Pulse 80   Ht 5\' 3"  (1.6 m)   Wt 158 lb 6.4 oz (71.8 kg)   SpO2 97%   BMI 28.06 kg/m   GEN: A/Ox3; pleasant , NAD, well nourished    HEENT:  [place]/AT,  EACs-clear, TMs-wnl, NOSE-clear, THROAT-clear, no lesions, no postnasal drip or exudate noted. No stridor   NECK:  Supple w/ fair ROM; no JVD; normal carotid impulses w/o bruits; no thyromegaly or nodules palpated; no lymphadenopathy.    RESP  Few trace wheezes , . no accessory muscle use, no dullness to percussion Speaking in full sentences  CARD:  RRR, no m/r/g, no peripheral edema, pulses intact, no cyanosis or clubbing.  GI:   Soft & nt; nml bowel sounds; no organomegaly or masses detected.   Musco: Warm bil, no deformities or joint swelling noted.   Neuro: alert, no focal deficits noted.    Skin: Warm, no lesions or rashes    Lab Results:  CBC  BNP No results found for: BNP  ProBNP No results found for: PROBNP  Imaging: No results found.   Assessment & Plan:   Asthma exacerbation Flare with URI   Plan  Patient Instructions  Zpack take as  Directed.  Prednisone taper over next week.  Mucinex DM Twice daily  As needed  Cough /congestion  Continue  Symbicort 160 2 puffs Twice daily  , rinse after use.  Follow  up [name] as planned next week.   Please contact office for sooner follow up if symptoms do not improve or worsen or seek emergency care         Rexene Edison, NP 11/25/2016

## 2016-11-25 NOTE — Patient Instructions (Addendum)
Zpack take as  Directed.  Prednisone taper over next week.  Mucinex DM Twice daily  As needed  Cough /congestion  Continue  Symbicort 160 2 puffs Twice daily  , rinse after use.  Follow up [name] as planned next week.   Please contact office for sooner follow up if symptoms do not improve or worsen or seek emergency care

## 2016-11-25 NOTE — Assessment & Plan Note (Signed)
Flare with URI   Plan  Patient Instructions  Zpack take as  Directed.  Prednisone taper over next week.  Mucinex DM Twice daily  As needed  Cough /congestion  Continue  Symbicort 160 2 puffs Twice daily  , rinse after use.  Follow up [name] as planned next week.   Please contact office for sooner follow up if symptoms do not improve or worsen or seek emergency care

## 2016-11-29 ENCOUNTER — Ambulatory Visit (INDEPENDENT_AMBULATORY_CARE_PROVIDER_SITE_OTHER): Payer: Medicare Other | Admitting: Internal Medicine

## 2016-11-29 DIAGNOSIS — J45901 Unspecified asthma with (acute) exacerbation: Secondary | ICD-10-CM

## 2016-11-29 LAB — PULMONARY FUNCTION TEST
DL/VA % pred: 91 %
DL/VA: 4.28 ml/min/mmHg/L
DLCO COR: 19.74 ml/min/mmHg
DLCO cor % pred: 86 %
DLCO unc % pred: 87 %
DLCO unc: 20.03 ml/min/mmHg
FEF 25-75 PRE: 1.95 L/s
FEF 25-75 Post: 1.77 L/sec
FEF2575-%CHANGE-POST: -9 %
FEF2575-%PRED-PRE: 105 %
FEF2575-%Pred-Post: 95 %
FEV1-%Change-Post: -3 %
FEV1-%PRED-PRE: 95 %
FEV1-%Pred-Post: 91 %
FEV1-POST: 2 L
FEV1-Pre: 2.08 L
FEV1FVC-%CHANGE-POST: 0 %
FEV1FVC-%Pred-Pre: 104 %
FEV6-%CHANGE-POST: -4 %
FEV6-%PRED-POST: 91 %
FEV6-%Pred-Pre: 95 %
FEV6-PRE: 2.62 L
FEV6-Post: 2.5 L
FEV6FVC-%PRED-PRE: 105 %
FEV6FVC-%Pred-Post: 105 %
FVC-%CHANGE-POST: -4 %
FVC-%PRED-POST: 86 %
FVC-%Pred-Pre: 90 %
FVC-POST: 2.5 L
FVC-Pre: 2.62 L
POST FEV1/FVC RATIO: 80 %
PRE FEV6/FVC RATIO: 100 %
Post FEV6/FVC ratio: 100 %
Pre FEV1/FVC ratio: 79 %
RV % pred: 111 %
RV: 2.38 L
TLC % PRED: 103 %
TLC: 5.05 L

## 2016-11-29 NOTE — Progress Notes (Signed)
PFT done today. 

## 2016-11-30 ENCOUNTER — Encounter: Payer: Self-pay | Admitting: Internal Medicine

## 2016-11-30 ENCOUNTER — Ambulatory Visit (INDEPENDENT_AMBULATORY_CARE_PROVIDER_SITE_OTHER): Payer: Medicare Other | Admitting: Internal Medicine

## 2016-11-30 VITALS — BP 122/72 | HR 79 | Ht 63.0 in | Wt 156.0 lb

## 2016-11-30 DIAGNOSIS — J4540 Moderate persistent asthma, uncomplicated: Secondary | ICD-10-CM

## 2016-11-30 NOTE — Progress Notes (Signed)
Subjective:     Patient ID: [name], female   DOB: Jan 24, 1946, 71 y.o.   MRN: [id number]  HPI   OV 11/30/2016  Chief Complaint  Patient presents with  . Follow-up    Pt here after acute visit with TP on 8.31.2018. Pt states she is feeling improved but not back to baseline. Pt states she is still having prod cough with pale yellow mucus, still some SOB. Pt denies CP/tightness, f/c/s.      71 year old female with moderate persistent asthma. Last seen 11/25/2013 by her practitioner. Given prednisone and Z-Pak. Currently she is improved. Pulmonary function test yesterday shows normalized FEV1 and DLCO. Nitric oxide was high 3 weeks ago. She is here with her daughter [name]. Her son has graduated from anesthesia residency at the [employer]. She is worried about repeated it takes of steroids. She admitted to noncompliance with Symbicort and Singulair up until May 2018. She is also worried about her monoclonal antibody infusion for multiple sclerosis which she gets every 6 months for the last year or 2. She says after that she gets extremely fatigued and might require a prednisone burst. Occasionally she is not a respiratory infection. However she's been advised by her neurologist to continue with these injections in order to support her multiple sclerosis. There are no other new issues. She had a 7 mm lung nodule that resolved a year ago  Results for Robyn, [name] (MRN [id number]) as of 11/30/2016 09:47  Ref. Range 06/01/2015 16:23 10/07/2016 12:29 11/10/2016 15:22  Nitric Oxide Unknown 24 180 150  Results for Robyn, [name] (MRN [id number]) as of 11/30/2016 09:47  Ref. Range 06/01/2015 16:23 10/07/2016 12:29 10/07/2016 13:11 10/07/2016 20:51 11/10/2016 15:22  Eosinophils Absolute Latest Ref Range: 0.0 - 0.7 K/uL   0.4 0.3    Results for Robyn, [name] (MRN [id number]) as of 11/30/2016 09:47  Ref. Range 11/29/2016 10:07  FEV1-Post Latest Units: L 2.00  FEV1-%Pred-Post Latest Units: % 91   FEV1-%Change-Post Latest Units: % -3  Results for Robyn, [name] (MRN [id number]) as of 11/30/2016 09:47  Ref. Range 11/29/2016 10:07  DLCO cor Latest Units: ml/min/mmHg 19.74  DLCO cor % pred Latest Units: % 86    has a past medical history of Anemia; Anginal pain ([place]); Arthritis; Complication of anesthesia; Coronary artery disease; Dry eyes; Esophageal spasm; GERD (gastroesophageal reflux disease); and MS (multiple sclerosis) ([place]).   reports that she has never smoked. She has never used smokeless tobacco.  Past Surgical History:  Procedure Laterality Date  . ABDOMINAL HYSTERECTOMY    . BLEPHAROPLASTY Bilateral   . CARDIAC CATHETERIZATION  10/04/06   MINOR CAD,SINGLE VESSEL INVOLVING THE CIRCUMFLEX. 20 TO 30% PROXIMALLY AND 10 TO 20% IN THE MIDDLE SEGMENT.MILD MUSCLE BRIDGING, MID LAD.NORMAL RCA.NORMAL LV FUNCTION.NORMAL MITRAL AND AORTIC VALVE.NORMAL APPEARING AORTA,THORACIC AND ABDOMINAL.NORMAL RENAL ARTERIES.  [name] CARDIOLOGY NUCLEAR MED STUDY  06/22/12   NL LV FUNCTION,EF 68%,NL WALL MOTION.  [name] CAROTID DUPLEX  07/02/11   SWF:UXNA SOFT PLAQUE NOTED DISTAL CCA AND ORGIN AND PROXIMAL ICA,LEFT>RIGHT.NO ICA STENOSIS. VERTEBRAL ARTERY FLOW IS ANTEGRADE.  [name] CESAREAN SECTION     x2   . COLONOSCOPY WITH PROPOFOL N/A 12/11/2014   Procedure: COLONOSCOPY WITH PROPOFOL;  Surgeon: Ronald Lobo, MD;  Location: [facility];  Service: Endoscopy;  Laterality: N/A;  . KNEE ARTHROSCOPY Left    scope  . PARATHYROIDECTOMY     partial-many years ago  . thumb surgery Bilateral    built up and bone  removal  . TONSILLECTOMY    . TRANSTHORACIC ECHOCARDIOGRAM  07/02/11   LV CAVITY SIZE IS NORMAL. SYSTOLIC FUNCTION WAS NORMAL.EF=55% TO 60%.INCREASED RELATIVE CONTRIBUTION OF ATRIAL CONTRACTION TO VENTRICULAR FILLING;MAYBE DUE TO HYPOVOLEMIA. AV=MILD REGURG.    Allergies  Allergen Reactions  . Crestor [Rosuvastatin] Other (See Comments)    UNSPECIFIED   . Vicodin [Hydrocodone-Acetaminophen]   . Isovue  [Iopamidol]     Pt had sneezing and itching of her throat and soft palate.  Dr Alvester Chou checked pt.  She will need premeds in the future.  J Bohm    Immunization History  Administered Date(s) Administered  . Influenza, High Dose Seasonal PF 12/22/2013, 12/14/2015  . Influenza,inj,Quad PF,6+ Mos 11/27/2014  . Pneumococcal-Unspecified 10/26/2013    Family History  Problem Relation Age of Onset  . Heart disease Mother   . Asthma Father   . Bladder Cancer Father   . Heart failure Father   . Hyperlipidemia Brother      Current Outpatient Prescriptions:  .  amLODipine (NORVASC) 5 MG tablet, TAKE 1 TABLET ONCE DAILY., Disp: 30 tablet, Rfl: 11 .  aspirin EC 81 MG tablet, Take 1 tablet (81 mg total) by mouth daily., Disp: 90 tablet, Rfl: 3 .  azithromycin (ZITHROMAX Z-PAK) 250 MG tablet, Take 2 tablets (500 mg) on  Day 1,  followed by 1 tablet (250 mg) once daily on Days 2 through 5., Disp: 6 each, Rfl: 0 .  benzonatate (TESSALON) 100 MG capsule, Take 1 capsule (100 mg total) by mouth every 8 (eight) hours., Disp: 21 capsule, Rfl: 0 .  budesonide-formoterol (SYMBICORT) 160-4.5 MCG/ACT inhaler, Inhale 2 puffs into the lungs 2 (two) times daily., Disp: 1 Inhaler, Rfl: 5 .  carvedilol (COREG) 6.25 MG tablet, TAKE 1 TABLET TWICE DAILY WITH FOOD., Disp: 60 tablet, Rfl: 8 .  cetirizine (ZYRTEC ALLERGY) 10 MG tablet, Take 1 tablet (10 mg total) by mouth daily., Disp: 30 tablet, Rfl: 5 .  gabapentin (NEURONTIN) 300 MG capsule, Take 600 mg by mouth 4 (four) times daily as needed (pain). , Disp: , Rfl:  .  isosorbide mononitrate (IMDUR) 60 MG 24 hr tablet, TAKE 1 TABLET ONCE DAILY., Disp: 30 tablet, Rfl: 8 .  montelukast (SINGULAIR) 10 MG tablet, Take 1 tablet (10 mg total) by mouth at bedtime., Disp: 30 tablet, Rfl: 11 .  nitroGLYCERIN (NITROSTAT) 0.4 MG SL tablet, Place 1 tablet (0.4 mg total) under the tongue every 5 (five) minutes as needed for chest pain., Disp: 25 tablet, Rfl: 3 .  ocrelizumab  (OCREVUS) 300 MG/10ML injection, Inject 600 mg into the vein every 6 (six) months., Disp: , Rfl:  .  pantoprazole (PROTONIX) 20 MG tablet, Take 20 mg by mouth every other day. , Disp: , Rfl:  .  PARoxetine (PAXIL) 10 MG tablet, Take 5 mg by mouth every morning. , Disp: , Rfl:  .  predniSONE (DELTASONE) 10 MG tablet, 4 tabs for 2 days, then 3 tabs for 2 days, 2 tabs for 2 days, then 1 tab for 2 days, then stop, Disp: 20 tablet, Rfl: 0 .  ranitidine (ZANTAC) 150 MG tablet, Take 1 tablet (150 mg total) by mouth at bedtime. (Patient taking differently: Take 150 mg by mouth daily as needed for heartburn. ), Disp: , Rfl:  .  VYTORIN 10-20 MG per tablet, Take 1 tablet by mouth daily. , Disp: , Rfl:    Review of Systems     Objective:   Physical Exam  Constitutional: She is oriented  to person, place, and time. She appears well-developed and well-nourished. No distress.  HENT:  Head: Normocephalic and atraumatic.  Right Ear: External ear normal.  Left Ear: External ear normal.  Mouth/Throat: Oropharynx is clear and moist. No oropharyngeal exudate.  Eyes: Pupils are equal, round, and reactive to light. Conjunctivae and EOM are normal. Right eye exhibits no discharge. Left eye exhibits no discharge. No scleral icterus.  Neck: Normal range of motion. Neck supple. No JVD present. No tracheal deviation present. No thyromegaly present.  Cardiovascular: Normal rate, regular rhythm, normal heart sounds and intact distal pulses.  Exam reveals no gallop and no friction rub.   No murmur heard. Pulmonary/Chest: Effort normal and breath sounds normal. No respiratory distress. She has no wheezes. She has no rales. She exhibits no tenderness.  Abdominal: Soft. Bowel sounds are normal. She exhibits no distension and no mass. There is no tenderness. There is no rebound and no guarding.  Musculoskeletal: Normal range of motion. She exhibits no edema or tenderness.  Lymphadenopathy:    She has no cervical adenopathy.   Neurological: She is alert and oriented to person, place, and time. She has normal reflexes. No cranial nerve deficit. She exhibits normal muscle tone. Coordination normal.  Skin: Skin is warm and dry. No rash noted. She is not diaphoretic. No erythema. No pallor.  Psychiatric: She has a normal mood and affect. Her behavior is normal. Judgment and thought content normal.  Vitals reviewed.  Vitals:   11/30/16 0937  BP: 122/72  Pulse: 79  SpO2: 96%  Weight: 156 lb (70.8 kg)  Height: 5\' 3"  (1.6 m)    Estimated body mass index is 27.63 kg/m as calculated from the following:   Height as of this encounter: 5\' 3"  (1.6 m).   Weight as of this encounter: 156 lb (70.8 kg).     Assessment:       ICD-10-CM   1. Moderate persistent asthma without complication Y63.78        Plan:      Improved Lung function 11/30/2016 Is normal  Plan Continue symbicort 2 puff twice daily Continue albuterol as needed Cointinue singulair daily High dose flu shot next week OK for MS injection for now but important you keep your lung defense up with good control of asthma  Followu[ 3 molnths or sooner if needed  - aCQ and feno at followup   Dr. Brand Males, M.D., [facility].C.P Pulmonary and Critical Care Medicine Staff Physician [facility] Pager: [phone], If no answer or between  15:00h - 7:00h: call [phone]  11/30/2016 10:02 AM

## 2016-11-30 NOTE — Patient Instructions (Signed)
ICD-10-CM   1. Moderate persistent asthma without complication C94.70    Improved Lung function 11/30/2016 Is normal  Plan Continue symbicort 2 puff twice daily Continue albuterol as needed Cointinue singulair daily High dose flu shot next week OK for MS injection for now but important you keep your lung defense up with good control of asthma  Followu[ 3 molnths or sooner if needed  - aCQ and feno at followup

## 2016-12-14 ENCOUNTER — Ambulatory Visit (INDEPENDENT_AMBULATORY_CARE_PROVIDER_SITE_OTHER): Payer: Medicare Other

## 2016-12-14 DIAGNOSIS — Z23 Encounter for immunization: Secondary | ICD-10-CM | POA: Diagnosis not present

## 2016-12-27 ENCOUNTER — Ambulatory Visit (INDEPENDENT_AMBULATORY_CARE_PROVIDER_SITE_OTHER): Payer: Medicare Other | Admitting: Adult Health

## 2016-12-27 ENCOUNTER — Encounter: Payer: Self-pay | Admitting: Adult Health

## 2016-12-27 DIAGNOSIS — J45901 Unspecified asthma with (acute) exacerbation: Secondary | ICD-10-CM

## 2016-12-27 LAB — NITRIC OXIDE: Nitric Oxide: 187

## 2016-12-27 MED ORDER — AZITHROMYCIN 250 MG PO TABS: ORAL_TABLET | ORAL | 0 refills | Status: AC

## 2016-12-27 MED ORDER — LEVALBUTEROL HCL 0.63 MG/3ML IN NEBU
0.6300 mg | INHALATION_SOLUTION | Freq: Once | RESPIRATORY_TRACT | Status: AC
  Administered 2016-12-27: 0.63 mg via RESPIRATORY_TRACT

## 2016-12-27 MED ORDER — PREDNISONE 10 MG PO TABS: ORAL_TABLET | ORAL | 0 refills | Status: DC

## 2016-12-27 NOTE — Assessment & Plan Note (Signed)
Recurrent asthma exacerbation despite aggressive regimen -requiring frequent steroids  FEV1 preserved .  FENO remains elevated.  Elevated esoinophils on Diff.  IgE /RAST nml  Will tx w/ abx and steroids  Look to see if NUCALA may help   Plan  Patient Instructions  Zpack take as  Directed.  Prednisone taper over next week.  Mucinex DM Twice daily  As needed  Cough /congestion  Begin Nucala process. -paperwork  Continue  Symbicort 160 2 puffs Twice daily  , rinse after use.  Follow up with [name] in 6 weeks and As needed   Please contact office for sooner follow up if symptoms do not improve or worsen or seek emergency care

## 2016-12-27 NOTE — Progress Notes (Signed)
@Patient  ID: [name], female    DOB: Jan 04, 1946, 71 y.o.   MRN: [id number]  Chief Complaint  Patient presents with  . Acute Visit    Asthma     Referring provider: [name], MD  HPI: 71 year old female never smoker followed for asthma and lung nodule. MS on Infusion -OCREVUS every 6 months   TEST  09/2016 IgE 4, neg RAST  09/2016 Eosinophils 300-400  Feno >.150 10/2016  PFT 11/2016 >nml  FEV 1 , no restriction or obstruction , nml DLCO  12/27/2016 Acute OV  Pt presents for an acute office visit. Complains of 2 -3 weeks of wheezing , cough , congestion , drainage, thick yellow mucus .[name]  Remains on Symbicort, Singulair , Zyrtec.  Denies fever, chest pain, orthopnea, edema , or hemoptysis  FENO 180 today .  Appetite is good with no n/v.d.      Allergies  Allergen Reactions  . Crestor [Rosuvastatin] Other (See Comments)    UNSPECIFIED   . Vicodin [Hydrocodone-Acetaminophen]   . Isovue [Iopamidol]     Pt had sneezing and itching of her throat and soft palate.  Dr Alvester Chou checked pt.  She will need premeds in the future.  J Bohm    Immunization History  Administered Date(s) Administered  . Influenza, High Dose Seasonal PF 12/22/2013, 12/14/2015, 12/14/2016  . Influenza,inj,Quad PF,6+ Mos 11/27/2014  . Pneumococcal-Unspecified 10/26/2013    Past Medical History:  Diagnosis Date  . Anemia    past history-many yrs ago  . Anginal pain ([place])    being evaluated by Dr. Tyrone Sage, arm pain,"bad indigestion" -no heart related findings as of yet  . Arthritis    hip. back pain  . Complication of anesthesia    s/p Hysterectomy "vagal response "heart stopped" -did not require shocking.  . Coronary artery disease   . Dry eyes   . Esophageal spasm   . GERD (gastroesophageal reflux disease)   . MS (multiple sclerosis) ([place])    stable-sees [name] every 6 months    Tobacco History: History  Smoking Status  . Never Smoker  Smokeless Tobacco  . Never Used    Counseling given: Not Answered   Outpatient Encounter Prescriptions as of 12/27/2016  Medication Sig  . amLODipine (NORVASC) 5 MG tablet TAKE 1 TABLET ONCE DAILY.  [name] aspirin EC 81 MG tablet Take 1 tablet (81 mg total) by mouth daily.  . benzonatate (TESSALON) 100 MG capsule Take 1 capsule (100 mg total) by mouth every 8 (eight) hours.  . budesonide-formoterol (SYMBICORT) 160-4.5 MCG/ACT inhaler Inhale 2 puffs into the lungs 2 (two) times daily.  . carvedilol (COREG) 6.25 MG tablet TAKE 1 TABLET TWICE DAILY WITH FOOD.  [name] cetirizine (ZYRTEC ALLERGY) 10 MG tablet Take 1 tablet (10 mg total) by mouth daily.  [name] gabapentin (NEURONTIN) 300 MG capsule Take 600 mg by mouth 4 (four) times daily as needed (pain).   . isosorbide mononitrate (IMDUR) 60 MG 24 hr tablet TAKE 1 TABLET ONCE DAILY.  . montelukast (SINGULAIR) 10 MG tablet Take 1 tablet (10 mg total) by mouth at bedtime.  . nitroGLYCERIN (NITROSTAT) 0.4 MG SL tablet Place 1 tablet (0.4 mg total) under the tongue every 5 (five) minutes as needed for chest pain.  [name] ocrelizumab (OCREVUS) 300 MG/10ML injection Inject 600 mg into the vein every 6 (six) months.  . pantoprazole (PROTONIX) 20 MG tablet Take 20 mg by mouth every other day.   [name] PARoxetine (PAXIL) 10 MG tablet Take 5  mg by mouth every morning.   . ranitidine (ZANTAC) 150 MG tablet Take 1 tablet (150 mg total) by mouth at bedtime. (Patient taking differently: Take 150 mg by mouth daily as needed for heartburn. )  . VYTORIN 10-20 MG per tablet Take 1 tablet by mouth daily.   [name] azithromycin (ZITHROMAX Z-PAK) 250 MG tablet Take 2 tablets (500 mg) on  Day 1,  followed by 1 tablet (250 mg) once daily on Days 2 through 5.  . predniSONE (DELTASONE) 10 MG tablet 4 tabs for 2 days, then 3 tabs for 2 days, 2 tabs for 2 days, then 1 tab for 2 days, then stop  . [DISCONTINUED] predniSONE (DELTASONE) 10 MG tablet 4 tabs for 2 days, then 3 tabs for 2 days, 2 tabs for 2 days, then 1 tab for 2 days, then  stop (Patient not taking: Reported on 12/27/2016)   No facility-administered encounter medications on file as of 12/27/2016.      Review of Systems  Constitutional:   No  weight loss, night sweats,  Fevers, chills, fatigue, or  lassitude.  HEENT:   No headaches,  Difficulty swallowing,  Tooth/dental problems, or  Sore throat,                No sneezing, itching, ear ache,  +nasal congestion, post nasal drip,   CV:  No chest pain,  Orthopnea, PND, swelling in lower extremities, anasarca, dizziness, palpitations, syncope.   GI  No heartburn, indigestion, abdominal pain, nausea, vomiting, diarrhea, change in bowel habits, loss of appetite, bloody stools.   Resp:  No chest wall deformity  Skin: no rash or lesions.  GU: no dysuria, change in color of urine, no urgency or frequency.  No flank pain, no hematuria   MS:  No joint pain or swelling.  No decreased range of motion.  No back pain.    Physical Exam  BP 126/76 (BP Location: Left Arm, Cuff Size: Normal)   Pulse 95   Ht 5\' 3"  (1.6 m)   Wt 156 lb (70.8 kg)   SpO2 95%   BMI 27.63 kg/m   GEN: A/Ox3; pleasant , NAD, well nourished    HEENT:  [place]/AT,  EACs-clear, TMs-wnl, NOSE-clear, THROAT-clear, no lesions, no postnasal drip or exudate noted.   NECK:  Supple w/ fair ROM; no JVD; normal carotid impulses w/o bruits; no thyromegaly or nodules palpated; no lymphadenopathy.    RESP  Few trace exp wheezing , speaks in full sentences ,  no accessory muscle use, no dullness to percussion  CARD:  RRR, no m/r/g, no peripheral edema, pulses intact, no cyanosis or clubbing.  GI:   Soft & nt; nml bowel sounds; no organomegaly or masses detected.   Musco: Warm bil, no deformities or joint swelling noted.   Neuro: alert, no focal deficits noted.    Skin: Warm, no lesions or rashes    Lab Results:  CBC  BNP No results found for: BNP  ProBNP No results found for: PROBNP  Imaging: No results found.   Assessment & Plan:     Asthma exacerbation Recurrent asthma exacerbation despite aggressive regimen -requiring frequent steroids  FEV1 preserved .  FENO remains elevated.  Elevated esoinophils on Diff.  IgE /RAST nml  Will tx w/ abx and steroids  Look to see if NUCALA may help   Plan  Patient Instructions  Zpack take as  Directed.  Prednisone taper over next week.  Mucinex DM Twice daily  As needed  Cough /congestion  Begin Nucala process. -paperwork  Continue  Symbicort 160 2 puffs Twice daily  , rinse after use.  Follow up with [name] in 6 weeks and As needed   Please contact office for sooner follow up if symptoms do not improve or worsen or seek emergency care         Rexene Edison, NP 12/27/2016

## 2016-12-27 NOTE — Addendum Note (Signed)
Addended by: Parke Poisson E on: 12/27/2016 05:48 PM   Modules accepted: Orders

## 2016-12-27 NOTE — Patient Instructions (Addendum)
Zpack take as  Directed.  Prednisone taper over next week.  Mucinex DM Twice daily  As needed  Cough /congestion  Begin Nucala process. -paperwork  Continue  Symbicort 160 2 puffs Twice daily  , rinse after use.  Follow up with [name] in 6 weeks and As needed   Please contact office for sooner follow up if symptoms do not improve or worsen or seek emergency care

## 2017-01-11 ENCOUNTER — Telehealth: Payer: Self-pay | Admitting: Adult Health

## 2017-01-11 NOTE — Telephone Encounter (Signed)
I faxed the pt.'s Services Request Form 01/05/17. I received the sob 01/11/17. I called [facility], they needed a rx. I gave a verbal to the pharmacist. (01/11/17) The pharmacist said to call back in 3 business days if I don't hear from them. Hopefully I'll be able to go ahead and order pt.'s nucala then. (01/17/17)

## 2017-01-13 ENCOUNTER — Ambulatory Visit (INDEPENDENT_AMBULATORY_CARE_PROVIDER_SITE_OTHER): Payer: Medicare Other | Admitting: Adult Health

## 2017-01-13 ENCOUNTER — Telehealth: Payer: Self-pay | Admitting: Adult Health

## 2017-01-13 ENCOUNTER — Encounter: Payer: Self-pay | Admitting: Adult Health

## 2017-01-13 VITALS — BP 112/66 | HR 68 | Ht 63.0 in | Wt 155.6 lb

## 2017-01-13 DIAGNOSIS — J45901 Unspecified asthma with (acute) exacerbation: Secondary | ICD-10-CM | POA: Diagnosis not present

## 2017-01-13 MED ORDER — BENZONATATE 200 MG PO CAPS: 200.0000 mg | ORAL_CAPSULE | Freq: Three times a day (TID) | ORAL | 3 refills | Status: DC | PRN

## 2017-01-13 MED ORDER — ALBUTEROL SULFATE (2.5 MG/3ML) 0.083% IN NEBU: 2.5000 mg | INHALATION_SOLUTION | RESPIRATORY_TRACT | 5 refills | Status: DC | PRN

## 2017-01-13 NOTE — Assessment & Plan Note (Signed)
Recent flare now improving  No active wheezing on exam today . Good O2 sats  Continue to control for triggers NUCALA pending approval .   Check sputum culture /AFB   Plan  Patient Instructions  Continue with Nucala process. -paperwork  Continue  Symbicort 160 2 puffs Twice daily  , rinse after use.  Continue on Singulair At bedtime. Continue on Zyrtec 10mg  daily .  Mucinex DM Twice daily  As needed  Cough/congestion .  Tessalon Three times a day  As needed  Cough.  Combivent 1 puff every 4-6 hr As needed  Wheezing  Begin Albuterol Neb every 4hr as needed wheezing .  Sputum culture today .  Follow up with [name] in 4 weeks as planned and As needed   Please contact office for sooner follow up if symptoms do not improve or worsen or seek emergency care

## 2017-01-13 NOTE — Telephone Encounter (Signed)
Called and spoke with pt and she is aware that this rx has been sent to her pharmacy

## 2017-01-13 NOTE — Patient Instructions (Addendum)
Continue with Nucala process. -paperwork  Continue  Symbicort 160 2 puffs Twice daily  , rinse after use.  Continue on Singulair At bedtime. Continue on Zyrtec 10mg  daily .  Mucinex DM Twice daily  As needed  Cough/congestion .  Tessalon Three times a day  As needed  Cough.  Combivent 1 puff every 4-6 hr As needed  Wheezing  Begin Albuterol Neb every 4hr as needed wheezing .  Sputum culture today .  Follow up with [name] in 4 weeks as planned and As needed   Please contact office for sooner follow up if symptoms do not improve or worsen or seek emergency care

## 2017-01-13 NOTE — Progress Notes (Signed)
@Patient  ID: [name], female    DOB: Dec 09, 1945, 71 y.o.   MRN: [id number]  Chief Complaint  Patient presents with  . Acute Visit    Referring provider: [name], MD  HPI: 71 year old female never smoker followed for moderate persistent asthma and lung nodule. MS on Infusion -OCREVUS every 6 months  Son is an anesthesiologist   TEST  CT chest 01/2016 7 mm nodule resolved, stable other nodules , improved tree in bud nodularity  09/2016 IgE 4, neg RAST  09/2016 Eosinophils 300-400  Feno >.150 10/2016 >187 12/27/16  PFT 11/2016 >nml  FEV 1 , no restriction or obstruction , nml DLCO  01/13/2017 Follow up : Asthma  Pt returns for follow up for Asthma . She was seen on 12/27/16 for asthma bronchitic flare with 3 week hx of productive cough with thick yellow mucus and wheezing . Her FENO testing was elevated . She was treated with Zpack and Prednisone taper. Says she did get better with less cough, congestion and wheezing . She is concerned because she gets intermittent wheezing especially in am. Goes away after she uses Symbicort.  No fever, discolored mucus, chest pain, orthopnea,.  She remains on Symbicort, Singulair and Zyrtec. Says she is compliant .  She says she and family are concerned regarding steroids use. We went over steroid use and potential side effects and goal is to limit use as much as possible . Last ov Nucala was ordered and is under approval process . This was added to help with improved asthma control .  She is concerned for adrenal insufficiency , discussed that it can occur with prolonged steroid use. She has been on three 1 week steroid tapers over last 4 months . Suggested she discuss this with PCP to have additional testing or referral to endocrinology if indicated.  FENO testing today is slightly improved at 150 .     Allergies  Allergen Reactions  . Crestor [Rosuvastatin] Other (See Comments)    UNSPECIFIED   . Vicodin [Hydrocodone-Acetaminophen]     . Isovue [Iopamidol]     Pt had sneezing and itching of her throat and soft palate.  Dr Alvester Chou checked pt.  She will need premeds in the future.  J Bohm    Immunization History  Administered Date(s) Administered  . Influenza, High Dose Seasonal PF 12/22/2013, 12/14/2015, 12/14/2016  . Influenza,inj,Quad PF,6+ Mos 11/27/2014  . Pneumococcal-Unspecified 10/26/2013    Past Medical History:  Diagnosis Date  . Anemia    past history-many yrs ago  . Anginal pain ([place])    being evaluated by Dr. Tyrone Sage, arm pain,"bad indigestion" -no heart related findings as of yet  . Arthritis    hip. back pain  . Complication of anesthesia    s/p Hysterectomy "vagal response "heart stopped" -did not require shocking.  . Coronary artery disease   . Dry eyes   . Esophageal spasm   . GERD (gastroesophageal reflux disease)   . MS (multiple sclerosis) ([place])    stable-sees [name] every 6 months    Tobacco History: History  Smoking Status  . Never Smoker  Smokeless Tobacco  . Never Used   Counseling given: Not Answered   Outpatient Encounter Prescriptions as of 01/13/2017  Medication Sig  . amLODipine (NORVASC) 5 MG tablet TAKE 1 TABLET ONCE DAILY.  [name] aspirin EC 81 MG tablet Take 1 tablet (81 mg total) by mouth daily.  . benzonatate (TESSALON) 100 MG capsule Take 1 capsule (100 mg total)  by mouth every 8 (eight) hours.  . budesonide-formoterol (SYMBICORT) 160-4.5 MCG/ACT inhaler Inhale 2 puffs into the lungs 2 (two) times daily.  . carvedilol (COREG) 6.25 MG tablet TAKE 1 TABLET TWICE DAILY WITH FOOD.  [name] cetirizine (ZYRTEC ALLERGY) 10 MG tablet Take 1 tablet (10 mg total) by mouth daily.  [name] gabapentin (NEURONTIN) 300 MG capsule Take 600 mg by mouth 4 (four) times daily as needed (pain).   . Ipratropium-Albuterol (COMBIVENT RESPIMAT) 20-100 MCG/ACT AERS respimat Inhale 1 puff into the lungs every 6 (six) hours as needed for wheezing.  . isosorbide mononitrate (IMDUR) 60 MG 24 hr tablet TAKE  1 TABLET ONCE DAILY.  . montelukast (SINGULAIR) 10 MG tablet Take 1 tablet (10 mg total) by mouth at bedtime.  . nitroGLYCERIN (NITROSTAT) 0.4 MG SL tablet Place 1 tablet (0.4 mg total) under the tongue every 5 (five) minutes as needed for chest pain.  [name] ocrelizumab (OCREVUS) 300 MG/10ML injection Inject 600 mg into the vein every 6 (six) months.  . pantoprazole (PROTONIX) 20 MG tablet Take 20 mg by mouth every other day.   [name] PARoxetine (PAXIL) 10 MG tablet Take 5 mg by mouth every morning.   . predniSONE (DELTASONE) 10 MG tablet 4 tabs for 2 days, then 3 tabs for 2 days, 2 tabs for 2 days, then 1 tab for 2 days, then stop  . ranitidine (ZANTAC) 150 MG tablet Take 1 tablet (150 mg total) by mouth at bedtime. (Patient taking differently: Take 150 mg by mouth daily as needed for heartburn. )  . VYTORIN 10-20 MG per tablet Take 1 tablet by mouth daily.    No facility-administered encounter medications on file as of 01/13/2017.      Review of Systems  Constitutional:   No  weight loss, night sweats,  Fevers, chills, fatigue, or  lassitude.  HEENT:   No headaches,  Difficulty swallowing,  Tooth/dental problems, or  Sore throat,                No sneezing, itching, ear ache, + nasal congestion, post nasal drip,   CV:  No chest pain,  Orthopnea, PND, swelling in lower extremities, anasarca, dizziness, palpitations, syncope.   GI  No heartburn, indigestion, abdominal pain, nausea, vomiting, diarrhea, change in bowel habits, loss of appetite, bloody stools.   Resp:    No chest wall deformity  Skin: no rash or lesions.  GU: no dysuria, change in color of urine, no urgency or frequency.  No flank pain, no hematuria   MS:  No joint pain or swelling.  No decreased range of motion.  No back pain.    Physical Exam  BP 112/66 (BP Location: Left Arm, Cuff Size: Normal)   Pulse 68   Ht 5\' 3"  (1.6 m)   Wt 155 lb 9.6 oz (70.6 kg)   SpO2 97%   BMI 27.56 kg/m   GEN: A/Ox3; pleasant , NAD,  well nourished    HEENT:  [place]/AT,  EACs-clear, TMs-wnl, NOSE-clear, THROAT-clear, no lesions, no postnasal drip or exudate noted.   NECK:  Supple w/ fair ROM; no JVD; normal carotid impulses w/o bruits; no thyromegaly or nodules palpated; no lymphadenopathy.    RESP  Clear  P & A; w/o, wheezes/ rales/ or rhonchi. no accessory muscle use, no dullness to percussion. No increased wob , speaks in full sentences ,   CARD:  RRR, no m/r/g, no peripheral edema, pulses intact, no cyanosis or clubbing.  GI:   Soft &  nt; nml bowel sounds; no organomegaly or masses detected.   Musco: Warm bil, no deformities or joint swelling noted.   Neuro: alert, no focal deficits noted.    Skin: Warm, no lesions or rashes    Lab Results:  CBC  ProBNP No results found for: PROBNP  Imaging: No results found.   Assessment & Plan:   No problem-specific Assessment & Plan notes found for this encounter.     Rexene Edison, NP 01/13/2017

## 2017-01-16 ENCOUNTER — Other Ambulatory Visit: Payer: Medicare Other

## 2017-01-16 DIAGNOSIS — J45901 Unspecified asthma with (acute) exacerbation: Secondary | ICD-10-CM

## 2017-01-19 LAB — RESPIRATORY CULTURE OR RESPIRATORY AND SPUTUM CULTURE
MICRO NUMBER:: 81178060
RESULT:: NORMAL
SPECIMEN QUALITY: ADEQUATE

## 2017-01-27 ENCOUNTER — Ambulatory Visit (INDEPENDENT_AMBULATORY_CARE_PROVIDER_SITE_OTHER): Payer: Medicare Other | Admitting: Pulmonary Disease

## 2017-01-27 ENCOUNTER — Encounter: Payer: Self-pay | Admitting: Pulmonary Disease

## 2017-01-27 ENCOUNTER — Ambulatory Visit (INDEPENDENT_AMBULATORY_CARE_PROVIDER_SITE_OTHER)
Admission: RE | Admit: 2017-01-27 | Discharge: 2017-01-27 | Disposition: A | Discharge status: Home / Self Care | Payer: Medicare Other | Source: Ambulatory Visit | Attending: Pulmonary Disease | Admitting: Pulmonary Disease

## 2017-01-27 VITALS — BP 122/60 | HR 73 | Ht 63.0 in | Wt 156.0 lb

## 2017-01-27 DIAGNOSIS — J45901 Unspecified asthma with (acute) exacerbation: Secondary | ICD-10-CM | POA: Diagnosis not present

## 2017-01-27 LAB — NITRIC OXIDE: Nitric Oxide: 146

## 2017-01-27 MED ORDER — PREDNISONE 10 MG PO TABS: ORAL_TABLET | ORAL | 0 refills | Status: DC

## 2017-01-27 NOTE — Patient Instructions (Addendum)
We will get a chest x-ray today Increase Protonix to 20 mg twice daily Continue Singulair, Symbicort, Zyrtec We will make sure that he get the nebulizer machine and the albuterol nebulizer.  Use the nebulizer 4 times a day We will check on the status of Nucala approval Will call in prescription for prednisone 40 mg.  Reduce dose by 10 mg every 3 days  Follow up with [name] in 2 weeks.

## 2017-01-27 NOTE — Progress Notes (Signed)
[name]    [id number]    Jan 10, 1946  Primary Care Physician:Pharr, Thayer Jew, MD  Referring Physician: [name], MD [address] [place], [place] [address]  Chief complaint: Acute visit for dyspnea  HPI: 71 year old with history of persistent asthma, multiple sclerosis on  complains of increasing dyspnea for the past 1 week associated with yellow mucus, wheezing, chest tightness.  She denies any nebulizer but has not received them yet.  Paperwork is underway for initiation of nucala but has not been started yet.   She has significant history of GERD with esophageal spasm, atypical chest pain.  She follows at [facility] for this.  She is taking Protonix alternating with Zyrtec.  Outpatient Encounter Prescriptions as of 01/27/2017  Medication Sig  . albuterol (PROVENTIL) (2.5 MG/3ML) 0.083% nebulizer solution Take 3 mLs (2.5 mg total) by nebulization every 4 (four) hours as needed for wheezing or shortness of breath. Dx: A21.308  . amLODipine (NORVASC) 5 MG tablet TAKE 1 TABLET ONCE DAILY.  [name] aspirin EC 81 MG tablet Take 1 tablet (81 mg total) by mouth daily.  . benzonatate (TESSALON) 200 MG capsule Take 1 capsule (200 mg total) by mouth 3 (three) times daily as needed for cough.  . budesonide-formoterol (SYMBICORT) 160-4.5 MCG/ACT inhaler Inhale 2 puffs into the lungs 2 (two) times daily.  . carvedilol (COREG) 6.25 MG tablet TAKE 1 TABLET TWICE DAILY WITH FOOD.  [name] cetirizine (ZYRTEC ALLERGY) 10 MG tablet Take 1 tablet (10 mg total) by mouth daily.  [name] gabapentin (NEURONTIN) 300 MG capsule Take 600 mg by mouth 4 (four) times daily as needed (pain).   . Ipratropium-Albuterol (COMBIVENT RESPIMAT) 20-100 MCG/ACT AERS respimat Inhale 1 puff into the lungs every 6 (six) hours as needed for wheezing.  . isosorbide mononitrate (IMDUR) 60 MG 24 hr tablet TAKE 1 TABLET ONCE DAILY.  . montelukast (SINGULAIR) 10 MG tablet Take 1 tablet (10 mg total) by mouth at bedtime.    . nitroGLYCERIN (NITROSTAT) 0.4 MG SL tablet Place 1 tablet (0.4 mg total) under the tongue every 5 (five) minutes as needed for chest pain.  [name] ocrelizumab (OCREVUS) 300 MG/10ML injection Inject 600 mg into the vein every 6 (six) months.  . pantoprazole (PROTONIX) 20 MG tablet Take 20 mg by mouth every other day.   [name] PARoxetine (PAXIL) 10 MG tablet Take 5 mg by mouth every morning.   . ranitidine (ZANTAC) 150 MG tablet Take 1 tablet (150 mg total) by mouth at bedtime. (Patient taking differently: Take 150 mg by mouth daily as needed for heartburn. )  . VYTORIN 10-20 MG per tablet Take 1 tablet by mouth daily.   . [DISCONTINUED] predniSONE (DELTASONE) 10 MG tablet 4 tabs for 2 days, then 3 tabs for 2 days, 2 tabs for 2 days, then 1 tab for 2 days, then stop   No facility-administered encounter medications on file as of 01/27/2017.     Allergies as of 01/27/2017 - Review Complete 01/27/2017  Allergen Reaction Noted  . Crestor [rosuvastatin] Other (See Comments) 06/22/2012  . Vicodin [hydrocodone-acetaminophen]  07/01/2011  . Isovue [iopamidol]  05/19/2015    Past Medical History:  Diagnosis Date  . Anemia    past history-many yrs ago  . Anginal pain ([place])    being evaluated by Dr. Tyrone Sage, arm pain,"bad indigestion" -no heart related findings as of yet  . Arthritis    hip. back pain  . Complication of anesthesia  s/p Hysterectomy "vagal response "heart stopped" -did not require shocking.  . Coronary artery disease   . Dry eyes   . Esophageal spasm   . GERD (gastroesophageal reflux disease)   . MS (multiple sclerosis) ([place])    stable-sees [name] every 6 months    Past Surgical History:  Procedure Laterality Date  . ABDOMINAL HYSTERECTOMY    . BLEPHAROPLASTY Bilateral   . CARDIAC CATHETERIZATION  10/04/06   MINOR CAD,SINGLE VESSEL INVOLVING THE CIRCUMFLEX. 20 TO 30% PROXIMALLY AND 10 TO 20% IN THE MIDDLE SEGMENT.MILD MUSCLE BRIDGING, MID LAD.NORMAL RCA.NORMAL LV  FUNCTION.NORMAL MITRAL AND AORTIC VALVE.NORMAL APPEARING AORTA,THORACIC AND ABDOMINAL.NORMAL RENAL ARTERIES.  [name] CARDIOLOGY NUCLEAR MED STUDY  06/22/12   NL LV FUNCTION,EF 68%,NL WALL MOTION.  [name] CAROTID DUPLEX  07/02/11   UJW:JXBJ SOFT PLAQUE NOTED DISTAL CCA AND ORGIN AND PROXIMAL ICA,LEFT>RIGHT.NO ICA STENOSIS. VERTEBRAL ARTERY FLOW IS ANTEGRADE.  [name] CESAREAN SECTION     x2   . COLONOSCOPY WITH PROPOFOL N/A 12/11/2014   Procedure: COLONOSCOPY WITH PROPOFOL;  Surgeon: Ronald Lobo, MD;  Location: [facility];  Service: Endoscopy;  Laterality: N/A;  . KNEE ARTHROSCOPY Left    scope  . PARATHYROIDECTOMY     partial-many years ago  . thumb surgery Bilateral    built up and bone removal  . TONSILLECTOMY    . TRANSTHORACIC ECHOCARDIOGRAM  07/02/11   LV CAVITY SIZE IS NORMAL. SYSTOLIC FUNCTION WAS NORMAL.EF=55% TO 60%.INCREASED RELATIVE CONTRIBUTION OF ATRIAL CONTRACTION TO VENTRICULAR FILLING;MAYBE DUE TO HYPOVOLEMIA. AV=MILD REGURG.    Family History  Problem Relation Age of Onset  . Heart disease Mother   . Asthma Father   . Bladder Cancer Father   . Heart failure Father   . Hyperlipidemia Brother     Social History   Social History  . Marital status: Divorced    Spouse name: N/A  . Number of children: N/A  . Years of education: N/A   Occupational History  . retired    Social History Main Topics  . Smoking status: Never Smoker  . Smokeless tobacco: Never Used  . Alcohol use 0.0 oz/week     Comment: wine occ.  . Drug use: No  . Sexual activity: Not on file   Other Topics Concern  . Not on file   Social History Narrative  . No narrative on file   Review of systems: Review of Systems  Constitutional: Negative for fever and chills.  HENT: Negative.   Eyes: Negative for blurred vision.  Respiratory: as per HPI  Cardiovascular: Negative for chest pain and palpitations.  Gastrointestinal: Negative for vomiting, diarrhea, blood per rectum. Genitourinary: Negative for  dysuria, urgency, frequency and hematuria.  Musculoskeletal: Negative for myalgias, back pain and joint pain.  Skin: Negative for itching and rash.  Neurological: Negative for dizziness, tremors, focal weakness, seizures and loss of consciousness.  Endo/Heme/Allergies: Negative for environmental allergies.  Psychiatric/Behavioral: Negative for depression, suicidal ideas and hallucinations.  All other systems reviewed and are negative.  Physical Exam: Blood pressure 122/60, pulse 73, height 5\' 3"  (1.6 m), weight 156 lb (70.8 kg), SpO2 96 %. Gen:      No acute distress HEENT:  EOMI, sclera anicteric Neck:     No masses; no thyromegaly Lungs:    Exp wheeze normal respiratory effort CV:         Regular rate and rhythm; no murmurs Abd:      + bowel sounds; soft, non-tender; no palpable masses, no distension Ext:  No edema; adequate peripheral perfusion Skin:      Warm and dry; no rash Neuro: alert and oriented x 3 Psych: normal mood and affect  Data Reviewed: FENO 01/27/17- 146  PFTs 11/29/16 FVC 2.5 [86%], FEV1 2.0 [81%], F/F 80, TLC 103%, DLCO 87% No obstruction, restriction of diffusion defect  CBC 7/13-WBC 6, eosinophils 6%, absolute eosinophilic count 675  Assessment:  Persistent asthma with eosinophilic inflammation, frequent exacerbations Check chest x-ray today.  Do not believe she needs antibiotics unless there is an infiltrate Give prednisone taper Continue Symbicort, Singulair, Zyrtec Tessalon Order nebulizer machine and albuterol nebs Check on status of nucala  Severe GERD with esophageal dysfunction.  Follow[facility] at [facility] Increase Protonix to 20 twice daily  Plan/Recommendations: - Chest x-ray - Prednisone taper.  Start at 40 mg.  Reduce dose by 10 mg every 3 days - Continue current inhaler regimen - Increase Protonix to 20 twice daily - Order nebulizer machine and start albuterol nebs as needed - Awaiting initiation of nucala  Marshell Garfinkel MD  Pulmonary  and Critical Care Pager [phone] 01/27/2017, 3:21 PM  CC: [name], MD

## 2017-01-31 ENCOUNTER — Other Ambulatory Visit: Payer: Self-pay

## 2017-01-31 DIAGNOSIS — R918 Other nonspecific abnormal finding of lung field: Secondary | ICD-10-CM

## 2017-02-01 ENCOUNTER — Ambulatory Visit (INDEPENDENT_AMBULATORY_CARE_PROVIDER_SITE_OTHER)
Admission: RE | Admit: 2017-02-01 | Discharge: 2017-02-01 | Disposition: A | Discharge status: Home / Self Care | Payer: Medicare Other | Source: Ambulatory Visit | Attending: Pulmonary Disease | Admitting: Pulmonary Disease

## 2017-02-01 DIAGNOSIS — R918 Other nonspecific abnormal finding of lung field: Secondary | ICD-10-CM

## 2017-02-06 ENCOUNTER — Inpatient Hospital Stay: Admission: RE | Admit: 2017-02-06 | Payer: Medicare Other | Source: Ambulatory Visit

## 2017-02-08 ENCOUNTER — Encounter: Payer: Self-pay | Admitting: Internal Medicine

## 2017-02-08 ENCOUNTER — Ambulatory Visit: Payer: Medicare Other | Admitting: Internal Medicine

## 2017-02-08 VITALS — BP 130/78 | HR 69 | Ht 63.0 in | Wt 159.0 lb

## 2017-02-08 DIAGNOSIS — J82 Pulmonary eosinophilia, not elsewhere classified: Secondary | ICD-10-CM | POA: Diagnosis not present

## 2017-02-08 DIAGNOSIS — J4540 Moderate persistent asthma, uncomplicated: Secondary | ICD-10-CM | POA: Diagnosis not present

## 2017-02-08 DIAGNOSIS — J45909 Unspecified asthma, uncomplicated: Secondary | ICD-10-CM

## 2017-02-08 DIAGNOSIS — J8283 Eosinophilic asthma: Secondary | ICD-10-CM

## 2017-02-08 LAB — NITRIC OXIDE: NITRIC OXIDE: 115

## 2017-02-08 MED ORDER — NYSTATIN 100000 UNIT/ML MT SUSP: 5.0000 mL | Freq: Four times a day (QID) | OROMUCOSAL | 0 refills | Status: DC

## 2017-02-08 NOTE — Patient Instructions (Addendum)
ICD-10-CM   1. Moderate persistent asthma without complication A06.01   2. Extrinsic asthma without complication, unspecified asthma severity, unspecified whether persistent J45.909   3. 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     Currently improved from recent flare up However nitric oxide is still elevated even though improved and it shows continued every inflammation   Plan -If asthma flares up please call us and we will call prednisone - Continue Symbicort scheduled and Singulai -Take Spiriva Respimat sample and take this daily and see if this helps her breathing  -  -If so call us and we will send in a prescription -Start  Nucala as soon as possible  I am not so sure a copy can be reduced further but see if Nurse can give you an answer on that  - Also nurse to explain why you would need epipen shot   Followup 3 months or sooner if needed

## 2017-02-08 NOTE — Addendum Note (Signed)
Addended by: Collier Salina on: 02/08/2017 04:45 PM   Modules accepted: Orders

## 2017-02-08 NOTE — Addendum Note (Signed)
Addended by: Collier Salina on: 02/08/2017 12:03 PM   Modules accepted: Orders

## 2017-02-08 NOTE — Progress Notes (Signed)
Subjective:     Patient ID: [name], female   DOB: 06-Apr-1945, 71 y.o.   MRN: [id number]  HPI    OV 11/30/2016  Chief Complaint  Patient presents with  . Follow-up    Pt here after acute visit with TP on 8.31.2018. Pt states she is feeling improved but not back to baseline. Pt states she is still having prod cough with pale yellow mucus, still some SOB. Pt denies CP/tightness, f/c/s.      71 year old female with moderate persistent asthma. Last seen 11/25/2013 by her practitioner. Given prednisone and Z-Pak. Currently she is improved. Pulmonary function test yesterday shows normalized FEV1 and DLCO. Nitric oxide was high 3 weeks ago. She is here with her daughter [name]. Her son has graduated from anesthesia residency at the [employer]. She is worried about repeated it takes of steroids. She admitted to noncompliance with Symbicort and Singulair up until May 2018. She is also worried about her monoclonal antibody infusion for multiple sclerosis which she gets every 6 months for the last year or 2. She says after that she gets extremely fatigued and might require a prednisone burst. Occasionally she is not a respiratory infection. However she's been advised by her neurologist to continue with these injections in order to support her multiple sclerosis. There are no other new issues. She had a 7 mm lung nodule that resolved a year ago  71 year old female never smoker followed for moderate persistent asthma and lung nodule. MS on Infusion -OCREVUS every 6 months  Son is an anesthesiologist   01/13/2017 Follow up : Asthma  Pt returns for follow up for Asthma . She was seen on 12/27/16 for asthma bronchitic flare with 3 week hx of productive cough with thick yellow mucus and wheezing . Her FENO testing was elevated . She was treated with Zpack and Prednisone taper. Says she did get better with less cough, congestion and wheezing . She is concerned because she gets intermittent wheezing  especially in am. Goes away after she uses Symbicort.  No fever, discolored mucus, chest pain, orthopnea,.  She remains on Symbicort, Singulair and Zyrtec. Says she is compliant .  She says she and family are concerned regarding steroids use. We went over steroid use and potential side effects and goal is to limit use as much as possible . Last ov Nucala was ordered and is under approval process . This was added to help with improved asthma control .  She is concerned for adrenal insufficiency , discussed that it can occur with prolonged steroid use. She has been on three 1 week steroid tapers over last 4 months . Suggested she discuss this with PCP to have additional testing or referral to endocrinology if indicated.  FENO testing today is slightly improved at 150 .     Acute 01/27/17 71 year old with history of persistent asthma, multiple sclerosis on  complains of increasing dyspnea for the past 1 week associated with yellow mucus, wheezing, chest tightness.  She denies any nebulizer but has not received them yet.  Paperwork is underway for initiation of nucala but has not been started yet.   She has significant history of GERD with esophageal spasm, atypical chest pain.  She follows at [facility] for this.  She is taking Protonix alternating with Zyrtec.     OV 02/08/2017  Chief Complaint  Patient presents with  . Follow-up    Pt last seen by PM on 11.2.2018 for an acute visit. Pt states she  started to imporve but now is feeling like she is catching a cold again. Pt c/o prod cough with yellow mucus. Pt deniee SOB, CP/tihgtness and f/c/s.      71 year old female with eosinophilic and allergic asthma.  Presents for follow-up.  Most recently seen January 27, 2017 by my colleague for asthma exacerbation.  She is on her last day of prednisone today.  She feels improved.  Her nitric oxide has been the lowest it has been in 2018 which is 115 ppb but still significantly elevated.  She feels she is  catching a cold although she feels overall better.  She is on Symbicort schedule and Singulair scheduled.  She is not on any inhaled anticholinergic.  She is awaiting her interleukin-5 receptor antibody treatment.  She is worried about taking this because of cross interference with the other monoclonal antibody that she takes for her multiple sclerosis.  We had a discussion on this.  I did tell her that the cross-reactive and side effect profile is unknown and the general unpredictability can be higher.  Despite this we thought that the overall risk benefit ratio is in favor of her taking her interleukin-5 receptor antibody for asthma.   TEST  CT chest 01/2016 7 mm nodule resolved, stable other nodules , improved tree in bud nodularity  09/2016 IgE 4, neg RAST  09/2016 Eosinophils 300-400  Feno >.150 10/2016 >187 12/27/16  PFT 11/2016 >nml  FEV 1 , no restriction or obstruction , nml DLCO    Results for [name], [name] (MRN [id number]) as of 02/08/2017 11:13  Ref. Range 06/01/2015 16:23 10/07/2016 12:29 11/10/2016 15:22 12/27/2016 17:04 01/27/2017 15:29 02/08/2017   Nitric Oxide Unknown 24 180 150 187 146 115     Results for [name], [name] (MRN [id number]) as of 11/30/2016 09:47  Ref. Range 06/01/2015 16:23 10/07/2016 12:29 10/07/2016 13:11 10/07/2016 20:51 11/10/2016 15:22  Eosinophils Absolute Latest Ref Range: 0.0 - 0.7 K/uL   0.4 0.3    Results for [name], [name] (MRN [id number]) as of 11/30/2016 09:47  Ref. Range 11/29/2016 10:07  FEV1-Post Latest Units: L 2.00  FEV1-%Pred-Post Latest Units: % 91  FEV1-%Change-Post Latest Units: % -3  Results for [name], [name] (MRN [id number]) as of 11/30/2016 09:47  Ref. Range 11/29/2016 10:07  DLCO cor Latest Units: ml/min/mmHg 19.74  DLCO cor % pred Latest Units: % 86   Review of Systems     Objective:   Physical Exam  Constitutional: She is oriented to person, place, and time. She appears well-developed and well-nourished. No distress.  HENT:  Head:  Normocephalic and atraumatic.  Right Ear: External ear normal.  Left Ear: External ear normal.  Mouth/Throat: Oropharynx is clear and moist. No oropharyngeal exudate.  Eyes: Conjunctivae and EOM are normal. Pupils are equal, round, and reactive to light. Right eye exhibits no discharge. Left eye exhibits no discharge. No scleral icterus.  Neck: Normal range of motion. Neck supple. No JVD present. No tracheal deviation present. No thyromegaly present.  Cardiovascular: Normal rate, regular rhythm, normal heart sounds and intact distal pulses. Exam reveals no gallop and no friction rub.  No murmur heard. Pulmonary/Chest: Effort normal and breath sounds normal. No respiratory distress. She has no wheezes. She has no rales. She exhibits no tenderness.  Abdominal: Soft. Bowel sounds are normal. She exhibits no distension and no mass. There is no tenderness. There is no rebound and no guarding.  Musculoskeletal: Normal range of motion. She exhibits no edema or tenderness.  Lymphadenopathy:  She has no cervical adenopathy.  Neurological: She is alert and oriented to person, place, and time. She has normal reflexes. No cranial nerve deficit. She exhibits normal muscle tone. Coordination normal.  Skin: Skin is warm and dry. No rash noted. She is not diaphoretic. No erythema. No pallor.  Psychiatric: She has a normal mood and affect. Her behavior is normal. Judgment and thought content normal.  Vitals reviewed.  Vitals:   02/08/17 1107  BP: 130/78  Pulse: 69  SpO2: 96%  Weight: 159 lb (72.1 kg)  Height: 5\' 3"  (1.6 m)    Estimated body mass index is 28.17 kg/m as calculated from the following:   Height as of this encounter: 5\' 3"  (1.6 m).   Weight as of this encounter: 159 lb (72.1 kg).       Assessment:       ICD-10-CM   1. Moderate persistent asthma without complication N05.39   2. Extrinsic asthma without complication, unspecified asthma severity, unspecified whether persistent  J45.909   3. Eosinophilic asthma ([place]) J67        Plan:      Currently improved from recent flare up However nitric oxide is still elevated even though improved and it shows continued every inflammation   Plan -If asthma flares up please call us and we will call prednisone - Continue Symbicort scheduled and Singulai -Take Spiriva Respimat sample and take this daily and see if this helps her breathing  -  -If so call us and we will send in a prescription -Start  Nucala as soon as possible  I am not so sure a copy can be reduced further but see if Nurse can give you an answer on that  - Also nurse to explain why you would need epipen shot   Followup 3 months or sooner if needed   > 50% of this > 25 min visit spent in face to face counseling or coordination of care    Dr. Brand Males, M.D., [facility].C.P Pulmonary and Critical Care Medicine Staff Physician [place] [facility] Pager: [phone], If no answer or between  15:00h - 7:00h: call [phone]  02/08/2017 11:31 AM

## 2017-02-13 ENCOUNTER — Other Ambulatory Visit: Payer: Self-pay | Admitting: Cardiovascular Disease

## 2017-02-14 NOTE — Telephone Encounter (Signed)
Pt. Would not answer when [facility] called her several times. Apparently she was sent the co-pay asst. # b/c she confirmed it when I gave it to her. Pt. Said she would call, will wait for pt. Or [facility] to call back.

## 2017-02-15 ENCOUNTER — Telehealth: Payer: Self-pay | Admitting: Internal Medicine

## 2017-02-15 MED ORDER — AZITHROMYCIN 250 MG PO TABS: ORAL_TABLET | ORAL | 0 refills | Status: AC

## 2017-02-15 NOTE — Telephone Encounter (Signed)
Pt is aware of MW's recommendations and voiced her understanding.  Rx has been sent to preferred pharmacy. Nothing further needed.  

## 2017-02-15 NOTE — Telephone Encounter (Signed)
Spoke with pt, still having a productive cough with yellow mucus. She has been using her nebulizer, Mucinex, Tessalon Pearles, Singular but still having cough with a tight chest. Denies fever/chills/body aches. She is requesting an ABX for her symptoms and states MR has always given her a Zpak for her symptoms. Please advise.    [employer].

## 2017-02-15 NOTE — Telephone Encounter (Signed)
zpak is fine, ov in a week if not back to baseline

## 2017-02-21 ENCOUNTER — Encounter (HOSPITAL_COMMUNITY): Payer: Self-pay

## 2017-02-21 ENCOUNTER — Encounter (HOSPITAL_COMMUNITY)
Admission: RE | Admit: 2017-02-21 | Discharge: 2017-02-21 | Disposition: A | Discharge status: Home / Self Care | Payer: Medicare Other | Source: Ambulatory Visit | Attending: Psychiatry | Admitting: Psychiatry

## 2017-02-21 ENCOUNTER — Telehealth: Payer: Self-pay | Admitting: Internal Medicine

## 2017-02-21 DIAGNOSIS — G35 Multiple sclerosis: Secondary | ICD-10-CM | POA: Diagnosis not present

## 2017-02-21 MED ORDER — DIPHENHYDRAMINE HCL 50 MG/ML IJ SOLN
25.0000 mg | Freq: Once | INTRAMUSCULAR | Status: AC
  Administered 2017-02-21: 25 mg via INTRAVENOUS
  Filled 2017-02-21: qty 1

## 2017-02-21 MED ORDER — PANTOPRAZOLE SODIUM 20 MG PO TBEC: 20.0000 mg | DELAYED_RELEASE_TABLET | Freq: Two times a day (BID) | ORAL | 3 refills | Status: DC

## 2017-02-21 MED ORDER — ACETAMINOPHEN 500 MG PO TABS
1000.0000 mg | ORAL_TABLET | ORAL | Status: AC | PRN
  Administered 2017-02-21: 1000 mg via ORAL
  Filled 2017-02-21: qty 2

## 2017-02-21 MED ORDER — METHYLPREDNISOLONE SODIUM SUCC 125 MG IJ SOLR
125.0000 mg | Freq: Once | INTRAMUSCULAR | Status: AC
  Administered 2017-02-21: 125 mg via INTRAVENOUS
  Filled 2017-02-21: qty 2

## 2017-02-21 MED ORDER — SODIUM CHLORIDE 0.9 % IV SOLN
600.0000 mg | Freq: Once | INTRAVENOUS | Status: AC
Start: 2017-02-21 — End: 2017-02-21
  Administered 2017-02-21: 600 mg via INTRAVENOUS
  Filled 2017-02-21: qty 20

## 2017-02-21 MED ORDER — SODIUM CHLORIDE 0.9 % IV SOLN
Freq: Once | INTRAVENOUS | Status: AC
Start: 2017-02-21 — End: 2017-02-21
  Administered 2017-02-21: 09:00:00 via INTRAVENOUS

## 2017-02-21 NOTE — Discharge Instructions (Signed)
Ocrevus Ocrelizumab injection What is this medicine? OCRELIZUMAB (ok re LIZ ue mab) treats multiple sclerosis. It helps to decrease the number of multiple sclerosis relapses. It is not a cure. This medicine may be used for other purposes; ask your health care provider or pharmacist if you have questions. COMMON BRAND NAME(S): OCREVUS What should I tell my health care provider before I take this medicine? They need to know if you have any of these conditions: -cancer -hepatitis B infection -other infection (especially a virus infection such as chickenpox, cold sores, or herpes) -an unusual or allergic reaction to ocrelizumab, other medicines, foods, dyes or preservatives -pregnant or trying to get pregnant -breast-feeding How should I use this medicine? This medicine is for infusion into a vein. It is given by a health care professional in a hospital or clinic setting. Talk to your pediatrician regarding the use of this medicine in children. Special care may be needed. Overdosage: If you think you have taken too much of this medicine contact a poison control center or emergency room at once. NOTE: This medicine is only for you. Do not share this medicine with others. What if I miss a dose? Keep appointments for follow-up doses as directed. It is important not to miss your dose. Call your doctor or health care professional if you are unable to keep an appointment. What may interact with this medicine? -alemtuzumab -daclizumab -dimethyl fumarate -fingolimod -glatiramer -interferon beta -live virus vaccines -mitoxantrone -natalizumab -peginterferon beta -rituximab -steroid medicines like prednisone or cortisone -teriflunomide This list may not describe all possible interactions. Give your health care provider a list of all the medicines, herbs, non-prescription drugs, or dietary supplements you use. Also tell them if you smoke, drink alcohol, or use illegal drugs. Some items may  interact with your medicine. What should I watch for while using this medicine? Tell your doctor or healthcare professional if your symptoms do not start to get better or if they get worse. This medicine can cause serious allergic reactions. To reduce your risk you may need to take medicine before treatment with this medicine. Take your medicine as directed. Women should inform their doctor if they wish to become pregnant or think they might be pregnant. There is a potential for serious side effects to an unborn child. Talk to your health care professional or pharmacist for more information. Female patients should use effective birth control methods while receiving this medicine and for 6 months after the last dose. Call your doctor or health care professional for advice if you get a fever, chills or sore throat, or other symptoms of a cold or flu. Do not treat yourself. This drug decreases your body's ability to fight infections. Try to avoid being around people who are sick. If you have a hepatitis B infection or a history of a hepatitis B infection, talk to your doctor. The symptoms of hepatitis B may get worse if you take this medicine. In some patients, this medicine may cause a serious brain infection that may cause death. If you have any problems seeing, thinking, speaking, walking, or standing, tell your doctor right away. If you cannot reach your doctor, urgently seek other source of medical care. This medicine can decrease the response to a vaccine. If you need to get vaccinated, tell your healthcare professional if you have received this medicine. Extra booster doses may be needed. Talk to your doctor to see if a different vaccination schedule is needed. Talk to your doctor about your risk of  cancer. You may be more at risk for certain types of cancers if you take this medicine. What side effects may I notice from receiving this medicine? Side effects that you should report to your doctor or  health care professional as soon as possible: -allergic reactions like skin rash, itching or hives, swelling of the face, lips, or tongue -breathing problems -facial flushing -fast, irregular heartbeat -lump or soreness in the breast -signs and symptoms of herpes such as cold sore, shingles, or genital sores -signs and symptoms of infection like fever or chills, cough, sore throat, pain or trouble passing urine -signs and symptoms of low blood pressure like dizziness; feeling faint or lightheaded, falls; unusually weak or tired -signs and symptoms of progressive multifocal leukoencephalopathy (PML) like changes in vision; clumsiness; confusion; personality changes; weakness on one side of the body -swelling of the ankles, feet, hands Side effects that usually do not require medical attention (report these to your doctor or health care professional if they continue or are bothersome): -back pain -depressed mood -diarrhea -pain, redness, or irritation at site where injected This list may not describe all possible side effects. Call your doctor for medical advice about side effects. You may report side effects to FDA at 1-800-FDA-1088. Where should I keep my medicine? This drug is given in a hospital or clinic and will not be stored at home. NOTE: This sheet is a summary. It may not cover all possible information. If you have questions about this medicine, talk to your doctor, pharmacist, or health care provider.  2018 Elsevier/Gold Standard (2015-06-30 09:40:25)

## 2017-02-21 NOTE — Telephone Encounter (Signed)
Spoke with pt and advised rx sent to pharmacy  We will get a chest x-ray today Increase Protonix to 20 mg twice daily Continue Singulair, Symbicort, Zyrtec We will make sure that he get the nebulizer machine and the albuterol nebulizer.  Use the nebulizer 4 times a day We will check on the status of Nucala approval Will call in prescription for prednisone 40 mg.  Reduce dose by 10 mg every 3 days  Follow up with [name] in 2 weeks.

## 2017-02-28 ENCOUNTER — Ambulatory Visit: Payer: Medicare Other | Admitting: Physician Assistant

## 2017-02-28 ENCOUNTER — Encounter: Payer: Self-pay | Admitting: Physician Assistant

## 2017-02-28 VITALS — BP 135/78 | HR 70 | Ht 63.0 in | Wt 156.8 lb

## 2017-02-28 DIAGNOSIS — I10 Essential (primary) hypertension: Secondary | ICD-10-CM

## 2017-02-28 DIAGNOSIS — I2510 Atherosclerotic heart disease of native coronary artery without angina pectoris: Secondary | ICD-10-CM

## 2017-02-28 DIAGNOSIS — J4540 Moderate persistent asthma, uncomplicated: Secondary | ICD-10-CM

## 2017-02-28 DIAGNOSIS — G35 Multiple sclerosis: Secondary | ICD-10-CM

## 2017-02-28 DIAGNOSIS — K224 Dyskinesia of esophagus: Secondary | ICD-10-CM | POA: Diagnosis not present

## 2017-02-28 DIAGNOSIS — E785 Hyperlipidemia, unspecified: Secondary | ICD-10-CM | POA: Diagnosis not present

## 2017-02-28 DIAGNOSIS — R079 Chest pain, unspecified: Secondary | ICD-10-CM

## 2017-02-28 DIAGNOSIS — R011 Cardiac murmur, unspecified: Secondary | ICD-10-CM | POA: Diagnosis not present

## 2017-02-28 NOTE — Progress Notes (Signed)
Cardiology Office Note    Date:  03/01/2017   ID:  [name], [name] 07/14/1945, MRN [id number]  PCP:  [name], MD  Cardiologist:  Dr. Claiborne Billings  Primary pulmonologist: [name]  Chief Complaint  Patient presents with  . Follow-up    seen for Dr. Claiborne Billings    History of Present Illness:  [name] is a 71 y.o. female with PMH of GERD, HTN, HLD, moderate persistent asthma, multiple sclerosis, esophageal spasm and CAD.  She was noted to have mild CAD on cardiac catheterization in July 2008 by Dr. Glade Lloyd.  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  She was placed on medical therapy.  Myoview in March 2014 showed nonspecific T wave changes, nondiagnostic 0.5-1 mm inferior lateral ST segment changes with stress.  Perfusion image was normal.  Last Myoview in December 2016 revealed normal perfusion with the 66%.  She was last seen by Dr. Claiborne Billings on 01/11/2016, she was doing well at the time.  A one-year follow-up was recommended.  Patient presents today for cardiology office visit.  She still occasionally has chest pain, this is consistent with her esophageal spasm pain.  It does not worsen with exertion.  It usually occurs at rest.  She also complaining of pain under her left and right breast every time she turned her body.  This is consistent with musculoskeletal pain versus pleurisy.  She has been having asthma exacerbation almost on a monthly basis.  Fortunately on physical exam, she does not appears to be volume overloaded.  She also complaining of a cramps in bilateral upper thigh.  She has very strong dorsalis pedis and posterior tibial pulse.  Suspicion for significant lower extremity PAD is very low.  She says the cramps does not worsen with ambulation.  Recently, she also informed by one of her physician that she has a heart murmur.  On physical exam, she appears to have a murmur near the aortic  valve area, previous echocardiogram indicated aortic regurgitation.  I will repeat echocardiogram, however my suspicion for significant aortic regurgitation is very low.  She can follow-up in 1 year.    Past Medical History:  Diagnosis Date  . Anemia    past history-many yrs ago  . Anginal pain ([place])    being evaluated by Dr. Tyrone Sage, arm pain,"bad indigestion" -no heart related findings as of yet  . Arthritis    hip. back pain  . Complication of anesthesia    s/p Hysterectomy "vagal response "heart stopped" -did not require shocking.  . Coronary artery disease   . Dry eyes   . Esophageal spasm   . GERD (gastroesophageal reflux disease)   . MS (multiple sclerosis) ([place])    stable-sees [name] every 6 months    Past Surgical History:  Procedure Laterality Date  . ABDOMINAL HYSTERECTOMY    . BLEPHAROPLASTY Bilateral   . CARDIAC CATHETERIZATION  10/04/06   MINOR CAD,SINGLE VESSEL INVOLVING THE CIRCUMFLEX. 20 TO 30% PROXIMALLY AND 10 TO 20% IN THE MIDDLE SEGMENT.MILD MUSCLE BRIDGING, MID LAD.NORMAL RCA.NORMAL LV FUNCTION.NORMAL MITRAL AND AORTIC VALVE.NORMAL APPEARING AORTA,THORACIC AND ABDOMINAL.NORMAL RENAL ARTERIES.  [name] CARDIOLOGY NUCLEAR MED STUDY  06/22/12   NL LV FUNCTION,EF 68%,NL WALL MOTION.  [name] CAROTID DUPLEX  07/02/11   IRJ:JOAC SOFT PLAQUE NOTED DISTAL CCA AND ORGIN AND PROXIMAL ICA,LEFT>RIGHT.NO ICA STENOSIS. VERTEBRAL ARTERY FLOW IS ANTEGRADE.  [name] CESAREAN SECTION  x2   . COLONOSCOPY WITH PROPOFOL N/A 12/11/2014   Procedure: COLONOSCOPY WITH PROPOFOL;  Surgeon: Ronald Lobo, MD;  Location: [facility];  Service: Endoscopy;  Laterality: N/A;  . KNEE ARTHROSCOPY Left    scope  . PARATHYROIDECTOMY     partial-many years ago  . thumb surgery Bilateral    built up and bone removal  . TONSILLECTOMY    . TRANSTHORACIC ECHOCARDIOGRAM  07/02/11   LV CAVITY SIZE IS NORMAL. SYSTOLIC FUNCTION WAS NORMAL.EF=55% TO 60%.INCREASED RELATIVE CONTRIBUTION OF ATRIAL CONTRACTION TO  VENTRICULAR FILLING;MAYBE DUE TO HYPOVOLEMIA. AV=MILD REGURG.    Current Medications: Outpatient Medications Prior to Visit  Medication Sig Dispense Refill  . albuterol (PROVENTIL) (2.5 MG/3ML) 0.083% nebulizer solution Take 3 mLs (2.5 mg total) by nebulization every 4 (four) hours as needed for wheezing or shortness of breath. Dx: J45.901 75 vial 5  . amLODipine (NORVASC) 5 MG tablet TAKE 1 TABLET ONCE DAILY. 30 tablet 11  . aspirin EC 81 MG tablet Take 1 tablet (81 mg total) by mouth daily. 90 tablet 3  . benzonatate (TESSALON) 200 MG capsule Take 1 capsule (200 mg total) by mouth 3 (three) times daily as needed for cough. 45 capsule 3  . budesonide-formoterol (SYMBICORT) 160-4.5 MCG/ACT inhaler Inhale 2 puffs into the lungs 2 (two) times daily. 1 Inhaler 5  . carvedilol (COREG) 6.25 MG tablet TAKE 1 TABLET TWICE DAILY WITH FOOD. 60 tablet 8  . cetirizine (ZYRTEC ALLERGY) 10 MG tablet Take 1 tablet (10 mg total) by mouth daily. 30 tablet 5  . gabapentin (NEURONTIN) 300 MG capsule Take 600 mg by mouth 4 (four) times daily as needed (pain).     . hyoscyamine (LEVSIN, ANASPAZ) 0.125 MG tablet Take 0.125 mg every 4 (four) hours as needed by mouth.    . Ipratropium-Albuterol (COMBIVENT RESPIMAT) 20-100 MCG/ACT AERS respimat Inhale 1 puff into the lungs every 6 (six) hours as needed for wheezing.    . isosorbide mononitrate (IMDUR) 60 MG 24 hr tablet TAKE 1 TABLET ONCE DAILY. 30 tablet 0  . levothyroxine (SYNTHROID, LEVOTHROID) 25 MCG tablet Take 25 mcg daily before breakfast by mouth.    . montelukast (SINGULAIR) 10 MG tablet Take 1 tablet (10 mg total) by mouth at bedtime. 30 tablet 11  . nitroGLYCERIN (NITROSTAT) 0.4 MG SL tablet Place 1 tablet (0.4 mg total) under the tongue every 5 (five) minutes as needed for chest pain. 25 tablet 3  . nystatin (MYCOSTATIN) 100000 UNIT/ML suspension Take 5 mLs (500,000 Units total) 4 (four) times daily by mouth. 100 mL 0  . ocrelizumab (OCREVUS) 300 MG/10ML  injection Inject 600 mg into the vein every 6 (six) months.    . pantoprazole (PROTONIX) 20 MG tablet Take 1 tablet (20 mg total) by mouth 2 (two) times daily. 60 tablet 3  . PARoxetine (PAXIL) 10 MG tablet Take 5 mg by mouth every morning.     . predniSONE (DELTASONE) 10 MG tablet Take 40mg  for 3 days, then 30mg  for 3 days, 20mg  for 3 days, 10mg  for 3 days, then stop 30 tablet 0  . ranitidine (ZANTAC) 150 MG tablet Take 1 tablet (150 mg total) by mouth at bedtime. (Patient taking differently: Take 150 mg by mouth daily as needed for heartburn. )    . VYTORIN 10-20 MG per tablet Take 1 tablet by mouth daily.      No facility-administered medications prior to visit.      Allergies:   Crestor [rosuvastatin]; Vicodin [hydrocodone-acetaminophen]; and  Isovue [iopamidol]   Social History   Socioeconomic History  . Marital status: Divorced    Spouse name: None  . Number of children: None  . Years of education: None  . Highest education level: None  Social Needs  . Financial resource strain: None  . Food insecurity - worry: None  . Food insecurity - inability: None  . Transportation needs - medical: None  . Transportation needs - non-medical: None  Occupational History  . Occupation: retired  Tobacco Use  . Smoking status: Never Smoker  . Smokeless tobacco: Never Used  Substance and Sexual Activity  . Alcohol use: Yes    Alcohol/week: 0.0 oz    Comment: wine occ.  . Drug use: No  . Sexual activity: None  Other Topics Concern  . None  Social History Narrative  . None     Family History:  The patient's family history includes Asthma in her father; Bladder Cancer in her father; Heart disease in her mother; Heart failure in her father; Hyperlipidemia in her brother.   ROS:   Please see the history of present illness.    ROS All other systems reviewed and are negative.   PHYSICAL EXAM:   VS:  BP 135/78   Pulse 70   Ht 5\' 3"  (1.6 m)   Wt 156 lb 12.8 oz (71.1 kg)   BMI 27.78  kg/m    GEN: Well nourished, well developed, in no acute distress  HEENT: normal  Neck: no JVD, carotid bruits, or masses Cardiac: RRR; no rubs, or gallops,no edema   1/6 diastolic murmur at RUSB Respiratory:  clear to auscultation bilaterally, normal work of breathing GI: soft, nontender, nondistended, + BS MS: no deformity or atrophy  Skin: warm and dry, no rash Neuro:  Alert and Oriented x 3, Strength and sensation are intact Psych: euthymic mood, full affect  Wt Readings from Last 3 Encounters:  02/28/17 156 lb 12.8 oz (71.1 kg)  02/21/17 156 lb 6.4 oz (70.9 kg)  02/08/17 159 lb (72.1 kg)      Studies/Labs Reviewed:   EKG:  EKG is ordered today.  The ekg ordered today demonstrates normal sinus rhythm, heart rate 67, no significant ST-T wave changes.  Recent Labs: 10/07/2016: BUN 16; Creatinine, Ser 0.77; Hemoglobin 13.7; Platelets 230; Potassium 4.0; Sodium 141   Lipid Panel No results found for: CHOL, TRIG, HDL, CHOLHDL, VLDL, LDLCALC, LDLDIRECT  Additional studies/ records that were reviewed today include:   Cath 10/04/2006     Echo 07/02/2011 LV EF: 55% -  60%  Study Conclusions  - Left ventricle: The cavity size was normal. Systolic function was normal. The estimated ejection fraction was in the range of 55% to 60%. Although no diagnostic regional wall motion abnormality was identified, this possibility cannot be completely excluded on the basis of this study. There was an increased relative contribution of atrial contraction to ventricular filling, which may be due to hypovolemia. - Aortic valve: Mild regurgitation.   ASSESSMENT:    1. Murmur   2. Chest pain, unspecified type   3. Hypertension, essential   4. Hyperlipidemia, unspecified hyperlipidemia type   5. Moderate persistent asthma, unspecified whether complicated   6. Multiple sclerosis ([place])   7. Esophageal spasm   8. Coronary artery disease involving native coronary artery of  native heart without angina pectoris      PLAN:  In order of problems listed above:  1. Heart murmur: obtain echocardiogram, likely mild aortic valve disease  2.  Atypical chest pain: nonexertional, two types of atypical chest discomfort involve both musculoskeletal and esophageal spasm. No further workup  3. Hypertension: blood pressure stable. Continue amlodipine, Imdur and coreg  4. Hyperlipidemia: on Vytorin, FLP and LFT by PCP  5. Moderate persistent asthma: followed by pulmonology, she has been having frequent recurrence  6. Multiple sclerosis: stable.   7. CAD: mild disease on previous cath, no convincing angina, last myoview 2016 normal. Continue ASA    Medication Adjustments/Labs and Tests Ordered: Current medicines are reviewed at length with the patient today.  Concerns regarding medicines are outlined above.  Medication changes, Labs and Tests ordered today are listed in the Patient Instructions below. Patient Instructions  Medication Instructions:   No changes  Labwork:   none  Testing/Procedures:  Your physician has requested that you have an echocardiogram. Echocardiography is a painless test that uses sound waves to create images of your heart. It provides your doctor with information about the size and shape of your heart and how well your heart's chambers and valves are working. This procedure takes approximately one hour. There are no restrictions for this procedure.   Follow-Up:  With Dr. Claiborne Billings in 1 year.   If you need a refill on your cardiac medications before your next appointment, please call your pharmacy.      Hilbert Corrigan, [place]  03/01/2017 11:33 PM    [facility] [place], [place], [place]  [address] Phone: [phone]; Fax: [phone]

## 2017-02-28 NOTE — Telephone Encounter (Signed)
Called pt. She called [employer] concerning co-pay assistance last wk., pt. Hasn't heard from them. [employer] promised her they would cb, yet they haven't. Pt. Is going to call them back this afternoon. Will wait to hear from pt. Or pharm. By fax or ph. Call.

## 2017-02-28 NOTE — Patient Instructions (Signed)
Medication Instructions:   No changes  Labwork:   none  Testing/Procedures:  Your physician has requested that you have an echocardiogram. Echocardiography is a painless test that uses sound waves to create images of your heart. It provides your doctor with information about the size and shape of your heart and how well your heart's chambers and valves are working. This procedure takes approximately one hour. There are no restrictions for this procedure.   Follow-Up:  With Dr. Claiborne Billings in 1 year.   If you need a refill on your cardiac medications before your next appointment, please call your pharmacy.

## 2017-03-01 ENCOUNTER — Telehealth: Payer: Self-pay | Admitting: Adult Health

## 2017-03-01 ENCOUNTER — Encounter: Payer: Self-pay | Admitting: Physician Assistant

## 2017-03-01 MED ORDER — EPINEPHRINE 0.3 MG/0.3ML IJ SOAJ: 0.3000 mg | Freq: Once | INTRAMUSCULAR | 10 refills | Status: AC

## 2017-03-01 NOTE — Telephone Encounter (Signed)
Rx sent 

## 2017-03-01 NOTE — Telephone Encounter (Signed)
Vials: 1 Order Date: 03/01/2017 Ship Date: 03/01/2017

## 2017-03-02 NOTE — Telephone Encounter (Signed)
Arrival Date: 03/02/17 # of Vials: 1 Lot #: [id number] Expiration Date: 05/2019

## 2017-03-03 LAB — MYCOBACTERIA,CULT W/FLUOROCHROME SMEAR
MICRO NUMBER:: 81178059
SMEAR:: NONE SEEN
SPECIMEN QUALITY: ADEQUATE

## 2017-03-08 ENCOUNTER — Ambulatory Visit (HOSPITAL_COMMUNITY): Payer: Medicare Other | Attending: Cardiology

## 2017-03-08 ENCOUNTER — Other Ambulatory Visit: Payer: Self-pay

## 2017-03-08 DIAGNOSIS — R011 Cardiac murmur, unspecified: Secondary | ICD-10-CM | POA: Diagnosis not present

## 2017-03-08 DIAGNOSIS — I350 Nonrheumatic aortic (valve) stenosis: Secondary | ICD-10-CM | POA: Insufficient documentation

## 2017-03-08 DIAGNOSIS — G35 Multiple sclerosis: Secondary | ICD-10-CM | POA: Insufficient documentation

## 2017-03-08 DIAGNOSIS — E785 Hyperlipidemia, unspecified: Secondary | ICD-10-CM | POA: Diagnosis not present

## 2017-03-08 DIAGNOSIS — I10 Essential (primary) hypertension: Secondary | ICD-10-CM | POA: Diagnosis not present

## 2017-03-08 DIAGNOSIS — I25119 Atherosclerotic heart disease of native coronary artery with unspecified angina pectoris: Secondary | ICD-10-CM | POA: Diagnosis not present

## 2017-03-08 DIAGNOSIS — J45909 Unspecified asthma, uncomplicated: Secondary | ICD-10-CM | POA: Diagnosis not present

## 2017-03-09 ENCOUNTER — Telehealth: Payer: Self-pay

## 2017-03-09 ENCOUNTER — Other Ambulatory Visit (HOSPITAL_COMMUNITY): Payer: Medicare Other

## 2017-03-09 NOTE — Telephone Encounter (Signed)
Patient states has is experiencing a tight pain across her chest, notices pain when she is reaching in any direction or turning, has been going on for 6 months, wants to know if cardiac or orthopedic, has MS and Asthma but states it is not a phantom pain. States wants to know if this is pleurisy?

## 2017-03-09 NOTE — Telephone Encounter (Signed)
Worse with movement does not sound like a cardiac pain - more likely musculoskeletal. Pleurisy would be worse with breathing.  Dr. Debara Pickett

## 2017-03-11 ENCOUNTER — Emergency Department (HOSPITAL_BASED_OUTPATIENT_CLINIC_OR_DEPARTMENT_OTHER)
Admission: EM | Admit: 2017-03-11 | Discharge: 2017-03-11 | Disposition: A | Discharge status: Home / Self Care | Payer: Medicare Other | Attending: Emergency Medicine | Admitting: Emergency Medicine

## 2017-03-11 ENCOUNTER — Encounter (HOSPITAL_BASED_OUTPATIENT_CLINIC_OR_DEPARTMENT_OTHER): Payer: Self-pay | Admitting: Emergency Medicine

## 2017-03-11 ENCOUNTER — Other Ambulatory Visit: Payer: Self-pay

## 2017-03-11 DIAGNOSIS — Z7982 Long term (current) use of aspirin: Secondary | ICD-10-CM | POA: Insufficient documentation

## 2017-03-11 DIAGNOSIS — D649 Anemia, unspecified: Secondary | ICD-10-CM | POA: Insufficient documentation

## 2017-03-11 DIAGNOSIS — G35 Multiple sclerosis: Secondary | ICD-10-CM | POA: Insufficient documentation

## 2017-03-11 DIAGNOSIS — I2510 Atherosclerotic heart disease of native coronary artery without angina pectoris: Secondary | ICD-10-CM | POA: Diagnosis not present

## 2017-03-11 DIAGNOSIS — Z79899 Other long term (current) drug therapy: Secondary | ICD-10-CM | POA: Insufficient documentation

## 2017-03-11 DIAGNOSIS — B370 Candidal stomatitis: Secondary | ICD-10-CM | POA: Diagnosis not present

## 2017-03-11 DIAGNOSIS — K1370 Unspecified lesions of oral mucosa: Secondary | ICD-10-CM | POA: Diagnosis present

## 2017-03-11 MED ORDER — NYSTATIN 100000 UNIT/ML MT SUSP: 500000.0000 [IU] | Freq: Four times a day (QID) | OROMUCOSAL | 0 refills | Status: DC

## 2017-03-11 NOTE — ED Notes (Signed)
ED Provider at bedside. 

## 2017-03-11 NOTE — ED Triage Notes (Signed)
Pt c/o thrush sxs x 2-3 days; receives IV steroids for MS

## 2017-03-11 NOTE — ED Provider Notes (Signed)
[facility] EMERGENCY DEPARTMENT Provider Note   CSN: [id number] Arrival date & time: 03/11/17  6599     History   Chief Complaint Chief Complaint  Patient presents with  . Thrush    HPI [name] is a 71 y.o. female.  Patient is a 71 year old female with a history of anemia, angina, coronary artery disease, MS presenting today with lesions and soreness of her mouth and throat.  Patient states she recently received IV Solu-Medrol approximately 2-3 weeks ago for a new infusion she is getting for her MS and noticed these patches a few days ago.  She has had a history of thrush in the past and feels that this is the same.  She denies any difficulty swallowing or eating.  She has no difficulty breathing.  She denies any fever or systemic symptoms.   The history is provided by the patient.    Past Medical History:  Diagnosis Date  . Anemia    past history-many yrs ago  . Anginal pain ([place])    being evaluated by Dr. Tyrone Sage, arm pain,"bad indigestion" -no heart related findings as of yet  . Arthritis    hip. back pain  . Complication of anesthesia    s/p Hysterectomy "vagal response "heart stopped" -did not require shocking.  . Coronary artery disease   . Dry eyes   . Esophageal spasm   . GERD (gastroesophageal reflux disease)   . MS (multiple sclerosis) ([place])    stable-sees [name] every 6 months    Patient Active Problem List   Diagnosis Date Noted  . Asthma exacerbation 10/07/2016  . Tightness in chest 06/01/2015  . Nodule of left lung 06/01/2015  . Pain in the chest   . Leukocytosis 04/25/2015  . Hyperlipidemia LDL goal <70 02/25/2014  . Peripheral neuropathy 02/25/2014  . GERD (gastroesophageal reflux disease) 02/25/2014  . Mild CAD 08/29/2013  . Chest pain 08/29/2013  . Multiple sclerosis ([place]) 08/29/2013  . Sinus tarsi syndrome of right ankle 08/01/2013  . Pes cavus 08/01/2013  . Syncope and collapse 07/02/2011  . Abdominal pain  07/02/2011  . Nausea & vomiting 07/02/2011  . Ovarian cyst 07/02/2011  . Weakness generalized 07/02/2011  . Diarrhea 07/02/2011    Past Surgical History:  Procedure Laterality Date  . ABDOMINAL HYSTERECTOMY    . BLEPHAROPLASTY Bilateral   . CARDIAC CATHETERIZATION  10/04/06   MINOR CAD,SINGLE VESSEL INVOLVING THE CIRCUMFLEX. 20 TO 30% PROXIMALLY AND 10 TO 20% IN THE MIDDLE SEGMENT.MILD MUSCLE BRIDGING, MID LAD.NORMAL RCA.NORMAL LV FUNCTION.NORMAL MITRAL AND AORTIC VALVE.NORMAL APPEARING AORTA,THORACIC AND ABDOMINAL.NORMAL RENAL ARTERIES.  [name] CARDIOLOGY NUCLEAR MED STUDY  06/22/12   NL LV FUNCTION,EF 68%,NL WALL MOTION.  [name] CAROTID DUPLEX  07/02/11   JTT:SVXB SOFT PLAQUE NOTED DISTAL CCA AND ORGIN AND PROXIMAL ICA,LEFT>RIGHT.NO ICA STENOSIS. VERTEBRAL ARTERY FLOW IS ANTEGRADE.  [name] CESAREAN SECTION     x2   . COLONOSCOPY WITH PROPOFOL N/A 12/11/2014   Procedure: COLONOSCOPY WITH PROPOFOL;  Surgeon: Ronald Lobo, MD;  Location: [facility];  Service: Endoscopy;  Laterality: N/A;  . KNEE ARTHROSCOPY Left    scope  . PARATHYROIDECTOMY     partial-many years ago  . thumb surgery Bilateral    built up and bone removal  . TONSILLECTOMY    . TRANSTHORACIC ECHOCARDIOGRAM  07/02/11   LV CAVITY SIZE IS NORMAL. SYSTOLIC FUNCTION WAS NORMAL.EF=55% TO 60%.INCREASED RELATIVE CONTRIBUTION OF ATRIAL CONTRACTION TO VENTRICULAR FILLING;MAYBE DUE TO HYPOVOLEMIA. AV=MILD REGURG.    OB History  No data available       Home Medications    Prior to Admission medications   Medication Sig Start Date End Date Taking? Authorizing Provider  albuterol (PROVENTIL) (2.5 MG/3ML) 0.083% nebulizer solution Take 3 mLs (2.5 mg total) by nebulization every 4 (four) hours as needed for wheezing or shortness of breath. Dx: P10.258 01/13/17   Parrett, Fonnie Mu, NP  amLODipine (NORVASC) 5 MG tablet TAKE 1 TABLET ONCE DAILY. 05/23/16   Troy Sine, MD  aspirin EC 81 MG tablet Take 1 tablet (81 mg total) by mouth daily.  04/16/15   Barrett, Evelene Croon, PA-C  benzonatate (TESSALON) 200 MG capsule Take 1 capsule (200 mg total) by mouth 3 (three) times daily as needed for cough. 01/13/17   Parrett, Fonnie Mu, NP  budesonide-formoterol (SYMBICORT) 160-4.5 MCG/ACT inhaler Inhale 2 puffs into the lungs 2 (two) times daily. 10/07/16   Parrett, Fonnie Mu, NP  carvedilol (COREG) 6.25 MG tablet TAKE 1 TABLET TWICE DAILY WITH FOOD. 07/12/16   Troy Sine, MD  cetirizine (ZYRTEC ALLERGY) 10 MG tablet Take 1 tablet (10 mg total) by mouth daily. 11/10/16   Parrett, Fonnie Mu, NP  gabapentin (NEURONTIN) 300 MG capsule Take 600 mg by mouth 4 (four) times daily as needed (pain).     [provider]  hyoscyamine (LEVSIN, ANASPAZ) 0.125 MG tablet Take 0.125 mg every 4 (four) hours as needed by mouth.    [provider]  Ipratropium-Albuterol (COMBIVENT RESPIMAT) 20-100 MCG/ACT AERS respimat Inhale 1 puff into the lungs every 6 (six) hours as needed for wheezing.    [provider]  isosorbide mononitrate (IMDUR) 60 MG 24 hr tablet TAKE 1 TABLET ONCE DAILY. 02/13/17   Troy Sine, MD  levothyroxine (SYNTHROID, LEVOTHROID) 25 MCG tablet Take 25 mcg daily before breakfast by mouth.    [provider]  montelukast (SINGULAIR) 10 MG tablet Take 1 tablet (10 mg total) by mouth at bedtime. 10/07/16   Parrett, Fonnie Mu, NP  nitroGLYCERIN (NITROSTAT) 0.4 MG SL tablet Place 1 tablet (0.4 mg total) under the tongue every 5 (five) minutes as needed for chest pain. 08/29/13   Troy Sine, MD  nystatin (MYCOSTATIN) 100000 UNIT/ML suspension Take 5 mLs (500,000 Units total) by mouth 4 (four) times daily. Swish and swallow 03/11/17   Blanchie Dessert, MD  ocrelizumab (OCREVUS) 300 MG/10ML injection Inject 600 mg into the vein every 6 (six) months.    [provider]  pantoprazole (PROTONIX) 20 MG tablet Take 1 tablet (20 mg total) by mouth 2 (two) times daily. 02/21/17   Mannam, Hart Robinsons, MD  PARoxetine (PAXIL)  10 MG tablet Take 5 mg by mouth every morning.     [provider]  predniSONE (DELTASONE) 10 MG tablet Take 40mg  for 3 days, then 30mg  for 3 days, 20mg  for 3 days, 10mg  for 3 days, then stop 01/27/17   Mannam, Praveen, MD  ranitidine (ZANTAC) 150 MG tablet Take 1 tablet (150 mg total) by mouth at bedtime. Patient taking differently: Take 150 mg by mouth daily as needed for heartburn.  04/26/15   Barton Dubois, MD  VYTORIN 10-20 MG per tablet Take 1 tablet by mouth daily.  07/30/13   [provider]    Family History Family History  Problem Relation Age of Onset  . Heart disease Mother   . Asthma Father   . Bladder Cancer Father   . Heart failure Father   . Hyperlipidemia Brother  Social History Social History   Tobacco Use  . Smoking status: Never Smoker  . Smokeless tobacco: Never Used  Substance Use Topics  . Alcohol use: Yes    Alcohol/week: 0.0 oz    Comment: wine occ.  . Drug use: No     Allergies   Crestor [rosuvastatin]; Vicodin [hydrocodone-acetaminophen]; and Isovue [iopamidol]   Review of Systems Review of Systems  All other systems reviewed and are negative.    Physical Exam Updated Vital Signs BP (!) 144/83 (BP Location: Left Arm)   Pulse 72   Temp 98.1 F (36.7 C) (Oral)   Resp 18   Ht 5\' 3"  (1.6 m)   Wt 70.8 kg (156 lb)   SpO2 100%   BMI 27.63 kg/m   Physical Exam  Constitutional: She is oriented to person, place, and time. She appears well-developed and well-nourished. No distress.  HENT:  Mouth/Throat: Mucous membranes are normal. Oral lesions present. No trismus in the jaw. No uvula swelling.    Eyes: EOM are normal. Pupils are equal, round, and reactive to light.  Neck: Normal range of motion. Neck supple.  Cardiovascular: Normal rate.  Pulmonary/Chest: Effort normal.  Neurological: She is alert and oriented to person, place, and time.  Skin: Skin is dry.  Psychiatric: She has a normal mood and affect. Her behavior  is normal.  Nursing note and vitals reviewed.    ED Treatments / Results  Labs (all labs ordered are listed, but only abnormal results are displayed) Labs Reviewed - No data to display  EKG  EKG Interpretation None       Radiology No results found.  Procedures Procedures (including critical care time)  Medications Ordered in ED Medications - No data to display   Initial Impression / Assessment and Plan / ED Course  I have reviewed the triage vital signs and the nursing notes.  Pertinent labs & imaging results that were available during my care of the patient were reviewed by me and considered in my medical decision making (see chart for details).     Patient with evidence of oral thrush most likely from recurrent IV infusions of Solu-Medrol.  She is otherwise well-appearing at this time.  Patient given nystatin suspension.  Also recommended starting a probiotic or eating yogurt to try to prevent this in the future.  Final Clinical Impressions(s) / ED Diagnoses   Final diagnoses:  Thrush, oral    ED Discharge Orders        Ordered    nystatin (MYCOSTATIN) 100000 UNIT/ML suspension  4 times daily     03/11/17 4401       Blanchie Dessert, MD 03/11/17 1001

## 2017-03-15 ENCOUNTER — Ambulatory Visit: Payer: Medicare Other

## 2017-03-15 NOTE — Telephone Encounter (Signed)
Patient voiced understanding. No further questions at this time.

## 2017-03-16 NOTE — Telephone Encounter (Signed)
Forgot to put order info. In this note but It's in nucala smart text. Nucala was ordered 03/01/17, it arrived 03/02/17. Nothing further needed. (I been covered up in work and with the snow days and I was scheduled off 2 days this wk.[name])

## 2017-03-18 ENCOUNTER — Other Ambulatory Visit: Payer: Self-pay | Admitting: Cardiovascular Disease

## 2017-03-19 ENCOUNTER — Emergency Department (HOSPITAL_BASED_OUTPATIENT_CLINIC_OR_DEPARTMENT_OTHER): Payer: Medicare Other

## 2017-03-19 ENCOUNTER — Other Ambulatory Visit: Payer: Self-pay

## 2017-03-19 ENCOUNTER — Emergency Department (HOSPITAL_BASED_OUTPATIENT_CLINIC_OR_DEPARTMENT_OTHER)
Admission: EM | Admit: 2017-03-19 | Discharge: 2017-03-19 | Disposition: A | Discharge status: Home / Self Care | Payer: Medicare Other | Attending: Emergency Medicine | Admitting: Emergency Medicine

## 2017-03-19 ENCOUNTER — Encounter (HOSPITAL_BASED_OUTPATIENT_CLINIC_OR_DEPARTMENT_OTHER): Payer: Self-pay | Admitting: Emergency Medicine

## 2017-03-19 DIAGNOSIS — J45909 Unspecified asthma, uncomplicated: Secondary | ICD-10-CM | POA: Diagnosis not present

## 2017-03-19 DIAGNOSIS — R0600 Dyspnea, unspecified: Secondary | ICD-10-CM | POA: Insufficient documentation

## 2017-03-19 DIAGNOSIS — D649 Anemia, unspecified: Secondary | ICD-10-CM | POA: Insufficient documentation

## 2017-03-19 DIAGNOSIS — J069 Acute upper respiratory infection, unspecified: Secondary | ICD-10-CM | POA: Insufficient documentation

## 2017-03-19 DIAGNOSIS — R05 Cough: Secondary | ICD-10-CM | POA: Diagnosis present

## 2017-03-19 DIAGNOSIS — B9789 Other viral agents as the cause of diseases classified elsewhere: Secondary | ICD-10-CM | POA: Diagnosis not present

## 2017-03-19 DIAGNOSIS — G35 Multiple sclerosis: Secondary | ICD-10-CM | POA: Diagnosis not present

## 2017-03-19 DIAGNOSIS — I2510 Atherosclerotic heart disease of native coronary artery without angina pectoris: Secondary | ICD-10-CM | POA: Diagnosis not present

## 2017-03-19 DIAGNOSIS — R0789 Other chest pain: Secondary | ICD-10-CM | POA: Diagnosis not present

## 2017-03-19 LAB — CBC WITH DIFFERENTIAL/PLATELET
BASOS PCT: 0 %
Basophils Absolute: 0 10*3/uL (ref 0.0–0.1)
Eosinophils Absolute: 0.3 10*3/uL (ref 0.0–0.7)
Eosinophils Relative: 5 %
HEMATOCRIT: 39 % (ref 36.0–46.0)
HEMOGLOBIN: 13 g/dL (ref 12.0–15.0)
LYMPHS ABS: 1 10*3/uL (ref 0.7–4.0)
Lymphocytes Relative: 19 %
MCH: 30.9 pg (ref 26.0–34.0)
MCHC: 33.3 g/dL (ref 30.0–36.0)
MCV: 92.6 fL (ref 78.0–100.0)
MONOS PCT: 17 %
Monocytes Absolute: 0.9 10*3/uL (ref 0.1–1.0)
NEUTROS ABS: 3.1 10*3/uL (ref 1.7–7.7)
NEUTROS PCT: 59 %
Platelets: 193 10*3/uL (ref 150–400)
RBC: 4.21 MIL/uL (ref 3.87–5.11)
RDW: 12.6 % (ref 11.5–15.5)
WBC: 5.2 10*3/uL (ref 4.0–10.5)

## 2017-03-19 LAB — COMPREHENSIVE METABOLIC PANEL
ALBUMIN: 3.9 g/dL (ref 3.5–5.0)
ALT: 24 U/L (ref 14–54)
ANION GAP: 10 (ref 5–15)
AST: 21 U/L (ref 15–41)
Alkaline Phosphatase: 75 U/L (ref 38–126)
BUN: 10 mg/dL (ref 6–20)
CHLORIDE: 107 mmol/L (ref 101–111)
CO2: 23 mmol/L (ref 22–32)
Calcium: 8.9 mg/dL (ref 8.9–10.3)
Creatinine, Ser: 0.72 mg/dL (ref 0.44–1.00)
GFR calc non Af Amer: >60 mL/min (ref >60)
GLUCOSE: 101 mg/dL — AB (ref 65–99)
POTASSIUM: 3.8 mmol/L (ref 3.5–5.1)
SODIUM: 140 mmol/L (ref 135–145)
Total Bilirubin: 0.6 mg/dL (ref 0.3–1.2)
Total Protein: 6.4 g/dL — ABNORMAL LOW (ref 6.5–8.1)

## 2017-03-19 LAB — TROPONIN I

## 2017-03-19 LAB — LIPASE, BLOOD: Lipase: 38 U/L (ref 11–51)

## 2017-03-19 MED ORDER — IPRATROPIUM-ALBUTEROL 0.5-2.5 (3) MG/3ML IN SOLN
3.0000 mL | Freq: Four times a day (QID) | RESPIRATORY_TRACT | Status: DC
  Administered 2017-03-19: 3 mL via RESPIRATORY_TRACT
  Filled 2017-03-19: qty 3

## 2017-03-19 MED ORDER — SODIUM CHLORIDE 0.9 % IV BOLUS (SEPSIS): 500.0000 mL | Freq: Once | INTRAVENOUS | Status: DC

## 2017-03-19 MED ORDER — AZITHROMYCIN 250 MG PO TABS: 250.0000 mg | ORAL_TABLET | Freq: Every day | ORAL | 0 refills | Status: DC

## 2017-03-19 MED ORDER — ALBUTEROL SULFATE HFA 108 (90 BASE) MCG/ACT IN AERS: 1.0000 | INHALATION_SPRAY | Freq: Four times a day (QID) | RESPIRATORY_TRACT | 0 refills | Status: DC | PRN

## 2017-03-19 MED ORDER — ONDANSETRON HCL 4 MG/2ML IJ SOLN: 4.0000 mg | Freq: Once | INTRAMUSCULAR | Status: DC

## 2017-03-19 MED ORDER — HYDROCODONE-GUAIFENESIN 2.5-200 MG/5ML PO SOLN: 5.0000 mL | Freq: Four times a day (QID) | ORAL | 0 refills | Status: AC | PRN

## 2017-03-19 NOTE — ED Notes (Signed)
RT assessed patient upon arrival to room. Strong NPC. BBS clear at the moment, but stated she just had a treatment one hour ago.

## 2017-03-19 NOTE — ED Provider Notes (Signed)
Emergency Department Provider Note   I have reviewed the triage vital signs and the nursing notes.   HISTORY  Chief Complaint Cough   HPI [name] is a 71 y.o. female with PMH of asthma, GERD, and MS since the emergency department for evaluation of cough, congestion, dyspnea, and chest discomfort mainly with coughing.  Patient states that she feels terrible and is experiencing severe fatigue.  She receives infusions for her MS with the last one performed 4 weeks ago.  Patient states that frequently after these infusions she will develop bronchitis symptoms.  She has been using her albuterol at home with some intermittent relief.  No abdominal pain or diarrhea.  She has felt some nausea but no vomiting.   Past Medical History:  Diagnosis Date  . Anemia    past history-many yrs ago  . Anginal pain ([place])    being evaluated by Dr. Tyrone Sage, arm pain,"bad indigestion" -no heart related findings as of yet  . Arthritis    hip. back pain  . Complication of anesthesia    s/p Hysterectomy "vagal response "heart stopped" -did not require shocking.  . Coronary artery disease   . Dry eyes   . Esophageal spasm   . GERD (gastroesophageal reflux disease)   . MS (multiple sclerosis) ([place])    stable-sees [name] every 6 months    Patient Active Problem List   Diagnosis Date Noted  . Asthma exacerbation 10/07/2016  . Tightness in chest 06/01/2015  . Nodule of left lung 06/01/2015  . Pain in the chest   . Leukocytosis 04/25/2015  . Hyperlipidemia LDL goal <70 02/25/2014  . Peripheral neuropathy 02/25/2014  . GERD (gastroesophageal reflux disease) 02/25/2014  . Mild CAD 08/29/2013  . Chest pain 08/29/2013  . Multiple sclerosis ([place]) 08/29/2013  . Sinus tarsi syndrome of right ankle 08/01/2013  . Pes cavus 08/01/2013  . Syncope and collapse 07/02/2011  . Abdominal pain 07/02/2011  . Nausea & vomiting 07/02/2011  . Ovarian cyst 07/02/2011  . Weakness generalized 07/02/2011    . Diarrhea 07/02/2011    Past Surgical History:  Procedure Laterality Date  . ABDOMINAL HYSTERECTOMY    . BLEPHAROPLASTY Bilateral   . CARDIAC CATHETERIZATION  10/04/06   MINOR CAD,SINGLE VESSEL INVOLVING THE CIRCUMFLEX. 20 TO 30% PROXIMALLY AND 10 TO 20% IN THE MIDDLE SEGMENT.MILD MUSCLE BRIDGING, MID LAD.NORMAL RCA.NORMAL LV FUNCTION.NORMAL MITRAL AND AORTIC VALVE.NORMAL APPEARING AORTA,THORACIC AND ABDOMINAL.NORMAL RENAL ARTERIES.  [name] CARDIOLOGY NUCLEAR MED STUDY  06/22/12   NL LV FUNCTION,EF 68%,NL WALL MOTION.  [name] CAROTID DUPLEX  07/02/11   PJK:DTOI SOFT PLAQUE NOTED DISTAL CCA AND ORGIN AND PROXIMAL ICA,LEFT>RIGHT.NO ICA STENOSIS. VERTEBRAL ARTERY FLOW IS ANTEGRADE.  [name] CESAREAN SECTION     x2   . COLONOSCOPY WITH PROPOFOL N/A 12/11/2014   Procedure: COLONOSCOPY WITH PROPOFOL;  Surgeon: Ronald Lobo, MD;  Location: [facility];  Service: Endoscopy;  Laterality: N/A;  . KNEE ARTHROSCOPY Left    scope  . PARATHYROIDECTOMY     partial-many years ago  . thumb surgery Bilateral    built up and bone removal  . TONSILLECTOMY    . TRANSTHORACIC ECHOCARDIOGRAM  07/02/11   LV CAVITY SIZE IS NORMAL. SYSTOLIC FUNCTION WAS NORMAL.EF=55% TO 60%.INCREASED RELATIVE CONTRIBUTION OF ATRIAL CONTRACTION TO VENTRICULAR FILLING;MAYBE DUE TO HYPOVOLEMIA. AV=MILD REGURG.    Current Outpatient Rx  . Order #: [id number] Class: Print  . Order #: [id number] Class: Normal  . Order #: [id number] Class: OTC  . Order #: [id number] Class: Print  .  Order #: [id number] Class: Normal  . Order #: [id number] Class: Normal  . Order #: [id number] Class: Normal  . Order #: [id number] Class: Normal  . Order #: [id number] Class: Historical Med  . Order #: [id number] Class: Print  . Order #: [id number] Class: Historical Med  . Order #: [id number] Class: Historical Med  . Order #: [id number] Class: Normal  . Order #: [id number] Class: Historical Med  . Order #: [id number] Class: Normal  . Order #: [id number] Class: Normal  . Order #: [id number] Class:  Print  . Order #: [id number] Class: Historical Med  . Order #: [id number] Class: Normal  . Order #: [id number] Class: Historical Med  . Order #: [id number] Class: Normal  . Order #: [id number] Class: No Print  . Order #: [id number] Class: Historical Med    Allergies Crestor [rosuvastatin]; Vicodin [hydrocodone-acetaminophen]; and Isovue [iopamidol]  Family History  Problem Relation Age of Onset  . Heart disease Mother   . Asthma Father   . Bladder Cancer Father   . Heart failure Father   . Hyperlipidemia Brother     Social History Social History   Tobacco Use  . Smoking status: Never Smoker  . Smokeless tobacco: Never Used  Substance Use Topics  . Alcohol use: Yes    Alcohol/week: 0.0 oz    Comment: wine occ.  . Drug use: No    Review of Systems  Constitutional: No fever/chills. Positive fatigue and body aches.  Eyes: No visual changes. ENT: No sore throat. Cardiovascular: Positive chest pain with coughing only. Respiratory: Positive shortness of breath. Positive cough.  Gastrointestinal: No abdominal pain.  No nausea, no vomiting.  No diarrhea.  No constipation. Genitourinary: Negative for dysuria. Musculoskeletal: Negative for back pain. Skin: Negative for rash. Neurological: Negative for headaches, focal weakness or numbness.  10-point ROS otherwise negative.  ____________________________________________   PHYSICAL EXAM:  VITAL SIGNS: ED Triage Vitals  Enc Vitals Group     BP 03/19/17 1511 134/75     Pulse Rate 03/19/17 1511 78     Resp 03/19/17 1511 18     Temp 03/19/17 1511 98.5 F (36.9 C)     Temp Source 03/19/17 1511 Oral     SpO2 03/19/17 1511 95 %     Weight 03/19/17 1510 153 lb (69.4 kg)     Height 03/19/17 1510 5\' 3"  (1.6 m)     Pain Score 03/19/17 1509 3    Constitutional: Alert and oriented. Well appearing and in no acute distress. Eyes: Conjunctivae are normal.  Head: Atraumatic. Nose: Positive congestion/rhinnorhea. Mouth/Throat: Mucous  membranes are slightly dry.  Oropharynx non-erythematous. Neck: No stridor.  Cardiovascular: Normal rate, regular rhythm. Good peripheral circulation. Grossly normal heart sounds.   Respiratory: Increased respiratory effort.  No retractions. Lungs CTAB. Gastrointestinal: Soft and nontender. No distention.  Musculoskeletal: No lower extremity tenderness nor edema. No gross deformities of extremities. Neurologic:  Normal speech and language. No gross focal neurologic deficits are appreciated.  Skin:  Skin is warm, dry and intact. No rash noted.  ____________________________________________   LABS (all labs ordered are listed, but only abnormal results are displayed)  Labs Reviewed  COMPREHENSIVE METABOLIC PANEL - Abnormal; Notable for the following components:      Result Value   Glucose, Bld 101 (*)    Total Protein 6.4 (*)    All other components within normal limits  LIPASE, BLOOD  CBC WITH DIFFERENTIAL/PLATELET  TROPONIN I   ____________________________________________  EKG   EKG Interpretation  Date/Time:  Sunday March 19 2017 16:08:31 EST Ventricular Rate:  71 PR Interval:  QRS Duration: 81 QT Interval:  403 QTC Calculation: 435 R Axis:   41 Text Interpretation:  Sinus rhythm Borderline repolarization abnormality No STEMI.  Confirmed by Nanda Quinton [phone]) on 03/19/2017 4:43:25 PM       ____________________________________________  RADIOLOGY  Dg Chest 2 View  Result Date: 03/19/2017 CLINICAL DATA:  Shortness of breath.  Cough and congestion. EXAM: CHEST  2 VIEW COMPARISON:  02/01/2017 CT scan FINDINGS: Mild atherosclerotic calcification of the aortic arch. Faint nodularity in the apical segment right upper lobe. Airway thickening is present, suggesting bronchitis or reactive airways disease. Mild subsegmental atelectasis for scarring anteriorly in the left lower lobe and lingula. No pleural effusion. IMPRESSION: 1. Airway thickening is present, suggesting  bronchitis or reactive airways disease. 2. Faint residual nodularity at the right lung apex, previously in a tree-in-bud pattern on CT characteristic of atypical infectious bronchiolitis. 3.  Aortic Atherosclerosis (ICD10-I70.0). Electronically Signed   By: Van Clines M.D.   On: 03/19/2017 16:08    ____________________________________________   PROCEDURES  Procedure(s) performed:   Procedures  None ____________________________________________   INITIAL IMPRESSION / ASSESSMENT AND PLAN / ED COURSE  Pertinent labs & imaging results that were available during my care of the patient were reviewed by me and considered in my medical decision making (see chart for details).  She presents to the emergency department for evaluation of bronchitis type symptoms.  She is afebrile with no tachycardia or hypoxemia.  Plan for chest x-ray, labs, IV fluids, and reassess after nebulizer.  Patient feeling better on reassessment after nebs. CXR with questionable area of residual atypical infection. Plan to start Azithromycin. No hypoxemia of other indication for admission at this time. Patient has been taking Tessalon so will transition to Codine cough syrup.   At this time, I do not feel there is any life-threatening condition present. I have reviewed and discussed all results (EKG, imaging, lab, urine as appropriate), exam findings with patient. I have reviewed nursing notes and appropriate previous records.  I feel the patient is safe to be discharged home without further emergent workup. Discussed usual and customary return precautions. Patient and family (if present) verbalize understanding and are comfortable with this plan.  Patient will follow-up with their primary care provider. If they do not have a primary care provider, information for follow-up has been provided to them. All questions have been answered.  ____________________________________________  FINAL CLINICAL IMPRESSION(S) / ED  DIAGNOSES  Final diagnoses:  Viral URI with cough    Note:  This document was prepared using Dragon voice recognition software and may include unintentional dictation errors.  Nanda Quinton, MD Emergency Medicine    Long, Wonda Olds, MD 03/19/17 2322

## 2017-03-19 NOTE — ED Notes (Addendum)
Spoke to [place] at [facility], Hydrocodone-Guaifenesin not available on formulary. Script changed to Codeine-guaifenesin, as ordered by Dr Laverta Baltimore

## 2017-03-19 NOTE — ED Notes (Signed)
IV not accessed, blood obtained. Fluids and zofran held at this time.

## 2017-03-19 NOTE — Discharge Instructions (Signed)

## 2017-03-19 NOTE — ED Triage Notes (Signed)
Patient states that she thinks that she has had a cough and congestion with course cough since last night. The patient is SOB and having increased WOB

## 2017-03-19 NOTE — ED Notes (Signed)
Delay in xray, pt receiving breathing tx and getting IV

## 2017-03-19 NOTE — ED Notes (Signed)
Patient transported to X-ray 

## 2017-03-20 NOTE — Telephone Encounter (Signed)
REFILL 

## 2017-03-21 ENCOUNTER — Emergency Department (HOSPITAL_COMMUNITY)
Admission: EM | Admit: 2017-03-21 | Discharge: 2017-03-21 | Disposition: A | Discharge status: Home / Self Care | Payer: Medicare Other | Attending: Emergency Medicine | Admitting: Emergency Medicine

## 2017-03-21 ENCOUNTER — Emergency Department (HOSPITAL_COMMUNITY): Payer: Medicare Other

## 2017-03-21 ENCOUNTER — Encounter (HOSPITAL_COMMUNITY): Payer: Self-pay | Admitting: *Deleted

## 2017-03-21 ENCOUNTER — Other Ambulatory Visit: Payer: Self-pay

## 2017-03-21 DIAGNOSIS — G35 Multiple sclerosis: Secondary | ICD-10-CM | POA: Insufficient documentation

## 2017-03-21 DIAGNOSIS — R0602 Shortness of breath: Secondary | ICD-10-CM | POA: Insufficient documentation

## 2017-03-21 DIAGNOSIS — J209 Acute bronchitis, unspecified: Secondary | ICD-10-CM

## 2017-03-21 DIAGNOSIS — I2510 Atherosclerotic heart disease of native coronary artery without angina pectoris: Secondary | ICD-10-CM | POA: Insufficient documentation

## 2017-03-21 DIAGNOSIS — R05 Cough: Secondary | ICD-10-CM | POA: Diagnosis present

## 2017-03-21 MED ORDER — PREDNISONE 20 MG PO TABS
60.0000 mg | ORAL_TABLET | Freq: Once | ORAL | Status: AC
  Administered 2017-03-21: 60 mg via ORAL
  Filled 2017-03-21: qty 3

## 2017-03-21 MED ORDER — IPRATROPIUM-ALBUTEROL 0.5-2.5 (3) MG/3ML IN SOLN
3.0000 mL | Freq: Once | RESPIRATORY_TRACT | Status: AC
Start: 2017-03-21 — End: 2017-03-21
  Administered 2017-03-21: 3 mL via RESPIRATORY_TRACT
  Filled 2017-03-21: qty 3

## 2017-03-21 MED ORDER — ALBUTEROL SULFATE (2.5 MG/3ML) 0.083% IN NEBU
5.0000 mg | INHALATION_SOLUTION | Freq: Once | RESPIRATORY_TRACT | Status: AC
  Administered 2017-03-21: 5 mg via RESPIRATORY_TRACT
  Filled 2017-03-21: qty 6

## 2017-03-21 MED ORDER — IPRATROPIUM-ALBUTEROL 0.5-2.5 (3) MG/3ML IN SOLN
3.0000 mL | Freq: Once | RESPIRATORY_TRACT | Status: AC
  Administered 2017-03-21: 3 mL via RESPIRATORY_TRACT
  Filled 2017-03-21: qty 3

## 2017-03-21 NOTE — Discharge Instructions (Signed)
Continue your current treatment including prednisone and azithromycin. Continue using your inhaler as needed. Return if symptoms are not being adequately controlled at home.

## 2017-03-21 NOTE — ED Notes (Signed)
Patient transported to X-ray 

## 2017-03-21 NOTE — ED Triage Notes (Signed)
Pt has been having coughing, congestion, and chills for the past 48 hours.  Pt was seen at [facility] yesterday and was given codeine and antibiotics.  Pt also got a prescription of steroids from her PCP today.  Pt has been taking all of these medications in addition to tessalon and inhalers.  Earlier this evening pt had an episode of coughing that got so severe and pt felt like she couldn't catch her breath.  Pt states that they were finally able to calm down the coughing with the aid of the a humidifier.  Bilateral expiratory wheezing heard with stethoscope in triage. Pt a/o x 4 and ambulatory.

## 2017-03-21 NOTE — ED Provider Notes (Signed)
[facility] DEPT Provider Note   CSN: [id number] Arrival date & time: 03/21/17  0030     History   Chief Complaint Chief Complaint  Patient presents with  . Shortness of Breath  . Cough    HPI [name] is a 71 y.o. female.  The history is provided by the patient.  She has history of multiple sclerosis, asthma, bronchitis and comes in with a 2-day history of cough productive of some light yellowish sputum.  She has not had fever, but states she usually does not run fevers.  She denies chills but has had some sweats.  She was seen in emergency yesterday and given a prescription for azithromycin and hydrocodone cough syrup.  She saw her primary care provider who added prednisone and she took a dose of prednisone 60mg  at about 8:30 PM.  Today, she had a severe coughing paroxysm where she felt like she could not catch her breath at all so she came back to the emergency department.  She has also been using her albuterol inhaler and taking Tessalon without relief.  Past Medical History:  Diagnosis Date  . Anemia    past history-many yrs ago  . Anginal pain ([place])    being evaluated by Dr. Tyrone Sage, arm pain,"bad indigestion" -no heart related findings as of yet  . Arthritis    hip. back pain  . Complication of anesthesia    s/p Hysterectomy "vagal response "heart stopped" -did not require shocking.  . Coronary artery disease   . Dry eyes   . Esophageal spasm   . GERD (gastroesophageal reflux disease)   . MS (multiple sclerosis) ([place])    stable-sees [name] every 6 months    Patient Active Problem List   Diagnosis Date Noted  . Asthma exacerbation 10/07/2016  . Tightness in chest 06/01/2015  . Nodule of left lung 06/01/2015  . Pain in the chest   . Leukocytosis 04/25/2015  . Hyperlipidemia LDL goal <70 02/25/2014  . Peripheral neuropathy 02/25/2014  . GERD (gastroesophageal reflux disease) 02/25/2014  . Mild CAD 08/29/2013  . Chest  pain 08/29/2013  . Multiple sclerosis ([place]) 08/29/2013  . Sinus tarsi syndrome of right ankle 08/01/2013  . Pes cavus 08/01/2013  . Syncope and collapse 07/02/2011  . Abdominal pain 07/02/2011  . Nausea & vomiting 07/02/2011  . Ovarian cyst 07/02/2011  . Weakness generalized 07/02/2011  . Diarrhea 07/02/2011    Past Surgical History:  Procedure Laterality Date  . ABDOMINAL HYSTERECTOMY    . BLEPHAROPLASTY Bilateral   . CARDIAC CATHETERIZATION  10/04/06   MINOR CAD,SINGLE VESSEL INVOLVING THE CIRCUMFLEX. 20 TO 30% PROXIMALLY AND 10 TO 20% IN THE MIDDLE SEGMENT.MILD MUSCLE BRIDGING, MID LAD.NORMAL RCA.NORMAL LV FUNCTION.NORMAL MITRAL AND AORTIC VALVE.NORMAL APPEARING AORTA,THORACIC AND ABDOMINAL.NORMAL RENAL ARTERIES.  [name] CARDIOLOGY NUCLEAR MED STUDY  06/22/12   NL LV FUNCTION,EF 68%,NL WALL MOTION.  [name] CAROTID DUPLEX  07/02/11   WER:XVQM SOFT PLAQUE NOTED DISTAL CCA AND ORGIN AND PROXIMAL ICA,LEFT>RIGHT.NO ICA STENOSIS. VERTEBRAL ARTERY FLOW IS ANTEGRADE.  [name] CESAREAN SECTION     x2   . COLONOSCOPY WITH PROPOFOL N/A 12/11/2014   Procedure: COLONOSCOPY WITH PROPOFOL;  Surgeon: Ronald Lobo, MD;  Location: [facility];  Service: Endoscopy;  Laterality: N/A;  . KNEE ARTHROSCOPY Left    scope  . PARATHYROIDECTOMY     partial-many years ago  . thumb surgery Bilateral    built up and bone removal  . TONSILLECTOMY    . TRANSTHORACIC ECHOCARDIOGRAM  07/02/11   LV CAVITY SIZE IS NORMAL. SYSTOLIC FUNCTION WAS NORMAL.EF=55% TO 60%.INCREASED RELATIVE CONTRIBUTION OF ATRIAL CONTRACTION TO VENTRICULAR FILLING;MAYBE DUE TO HYPOVOLEMIA. AV=MILD REGURG.    OB History    No data available       Home Medications    Prior to Admission medications   Medication Sig Start Date End Date Taking? Authorizing Provider  albuterol (PROVENTIL HFA;VENTOLIN HFA) 108 (90 Base) MCG/ACT inhaler Inhale 1-2 puffs into the lungs every 6 (six) hours as needed for wheezing or shortness of breath. 03/19/17   Long,  Wonda Olds, MD  amLODipine (NORVASC) 5 MG tablet TAKE 1 TABLET ONCE DAILY. 05/23/16   Troy Sine, MD  aspirin EC 81 MG tablet Take 1 tablet (81 mg total) by mouth daily. 04/16/15   Barrett, Evelene Croon, PA-C  azithromycin (ZITHROMAX) 250 MG tablet Take 1 tablet (250 mg total) by mouth daily. Take first 2 tablets together, then 1 every day until finished. 03/19/17   Long, Wonda Olds, MD  benzonatate (TESSALON) 200 MG capsule Take 1 capsule (200 mg total) by mouth 3 (three) times daily as needed for cough. 01/13/17   Parrett, Fonnie Mu, NP  budesonide-formoterol (SYMBICORT) 160-4.5 MCG/ACT inhaler Inhale 2 puffs into the lungs 2 (two) times daily. 10/07/16   Parrett, Fonnie Mu, NP  carvedilol (COREG) 6.25 MG tablet TAKE 1 TABLET TWICE DAILY WITH FOOD. 07/12/16   Troy Sine, MD  cetirizine (ZYRTEC ALLERGY) 10 MG tablet Take 1 tablet (10 mg total) by mouth daily. 11/10/16   Parrett, Fonnie Mu, NP  gabapentin (NEURONTIN) 300 MG capsule Take 600 mg by mouth 4 (four) times daily as needed (pain).     [provider]  HYDROcodone-GuaiFENesin 2.5-200 MG/5ML SOLN Take 5 mLs by mouth every 6 (six) hours as needed for up to 5 days (cough). 03/19/17 03/24/17  Long, Wonda Olds, MD  hyoscyamine (LEVSIN, ANASPAZ) 0.125 MG tablet Take 0.125 mg every 4 (four) hours as needed by mouth.    [provider]  Ipratropium-Albuterol (COMBIVENT RESPIMAT) 20-100 MCG/ACT AERS respimat Inhale 1 puff into the lungs every 6 (six) hours as needed for wheezing.    [provider]  isosorbide mononitrate (IMDUR) 60 MG 24 hr tablet TAKE 1 TABLET ONCE DAILY. 03/20/17   Troy Sine, MD  levothyroxine (SYNTHROID, LEVOTHROID) 25 MCG tablet Take 25 mcg daily before breakfast by mouth.    [provider]  montelukast (SINGULAIR) 10 MG tablet Take 1 tablet (10 mg total) by mouth at bedtime. 10/07/16   Parrett, Fonnie Mu, NP  nitroGLYCERIN (NITROSTAT) 0.4 MG SL tablet Place 1 tablet (0.4 mg total) under the tongue  every 5 (five) minutes as needed for chest pain. 08/29/13   Troy Sine, MD  nystatin (MYCOSTATIN) 100000 UNIT/ML suspension Take 5 mLs (500,000 Units total) by mouth 4 (four) times daily. Swish and swallow 03/11/17   Blanchie Dessert, MD  ocrelizumab (OCREVUS) 300 MG/10ML injection Inject 600 mg into the vein every 6 (six) months.    [provider]  pantoprazole (PROTONIX) 20 MG tablet Take 1 tablet (20 mg total) by mouth 2 (two) times daily. 02/21/17   Mannam, Hart Robinsons, MD  PARoxetine (PAXIL) 10 MG tablet Take 5 mg by mouth every morning.     [provider]  predniSONE (DELTASONE) 10 MG tablet Take 40mg  for 3 days, then 30mg  for 3 days, 20mg  for 3 days, 10mg  for 3 days, then stop 01/27/17   Marshell Garfinkel, MD  ranitidine (ZANTAC)  150 MG tablet Take 1 tablet (150 mg total) by mouth at bedtime. Patient taking differently: Take 150 mg by mouth daily as needed for heartburn.  04/26/15   Barton Dubois, MD  VYTORIN 10-20 MG per tablet Take 1 tablet by mouth daily.  07/30/13   [provider]    Family History Family History  Problem Relation Age of Onset  . Heart disease Mother   . Asthma Father   . Bladder Cancer Father   . Heart failure Father   . Hyperlipidemia Brother     Social History Social History   Tobacco Use  . Smoking status: Never Smoker  . Smokeless tobacco: Never Used  Substance Use Topics  . Alcohol use: Yes    Alcohol/week: 0.0 oz    Comment: wine occ.  . Drug use: No     Allergies   Crestor [rosuvastatin]; Vicodin [hydrocodone-acetaminophen]; and Isovue [iopamidol]   Review of Systems Review of Systems  All other systems reviewed and are negative.    Physical Exam Updated Vital Signs BP 122/75 (BP Location: Left Arm)   Pulse 82   Temp 97.9 F (36.6 C) (Oral)   Resp 20   Ht 5\' 3"  (1.6 m)   Wt 69.4 kg (153 lb)   SpO2 95%   BMI 27.10 kg/m   Physical Exam  Nursing note and vitals reviewed.  71 year old female, resting  comfortably and in no acute distress. Vital signs are normal. Oxygen saturation is 95%, which is normal. Head is normocephalic and atraumatic. PERRLA, EOMI. Oropharynx is clear. Neck is nontender and supple without adenopathy or JVD. Back is nontender and there is no CVA tenderness. Lungs have mild to moderate expiratory wheezes without rales or rhonchi. Chest is nontender. Heart has regular rate and rhythm without murmur. Abdomen is soft, flat, nontender without masses or hepatosplenomegaly and peristalsis is normoactive. Extremities have no cyanosis or edema, full range of motion is present. Skin is warm and dry without rash. Neurologic: Mental status is normal, cranial nerves are intact, there are no motor or sensory deficits.  ED Treatments / Results   EKG  EKG Interpretation  Date/Time:  Tuesday March 21 2017 00:42:59 EST Ventricular Rate:  83 PR Interval:    QRS Duration: 78 QT Interval:  383 QTC Calculation: 450 R Axis:   49 Text Interpretation:  Sinus rhythm Nonspecific T abnormalities, lateral leads When compared with ECG of 03/19/2017, No significant change was found Confirmed by Delora Fuel ([id number]) on 03/21/2017 12:51:07 AM       Radiology Dg Chest 2 View  Result Date: 03/21/2017 CLINICAL DATA:  Subacute onset of cough and congestion. EXAM: CHEST  2 VIEW COMPARISON:  Chest radiograph performed 03/19/2017 FINDINGS: The lungs are well-aerated and clear. There is no evidence of focal opacification, pleural effusion or pneumothorax. The heart is normal in size; the mediastinal contour is within normal limits. No acute osseous abnormalities are seen. IMPRESSION: No acute cardiopulmonary process seen. Electronically Signed   By: Garald Balding M.D.   On: 03/21/2017 01:42   Dg Chest 2 View  Result Date: 03/19/2017 CLINICAL DATA:  Shortness of breath.  Cough and congestion. EXAM: CHEST  2 VIEW COMPARISON:  02/01/2017 CT scan FINDINGS: Mild atherosclerotic calcification of  the aortic arch. Faint nodularity in the apical segment right upper lobe. Airway thickening is present, suggesting bronchitis or reactive airways disease. Mild subsegmental atelectasis for scarring anteriorly in the left lower lobe and lingula. No pleural effusion. IMPRESSION: 1. Airway  thickening is present, suggesting bronchitis or reactive airways disease. 2. Faint residual nodularity at the right lung apex, previously in a tree-in-bud pattern on CT characteristic of atypical infectious bronchiolitis. 3.  Aortic Atherosclerosis (ICD10-I70.0). Electronically Signed   By: Van Clines M.D.   On: 03/19/2017 16:08    Procedures Procedures (including critical care time)  Medications Ordered in ED Medications  ipratropium-albuterol (DUONEB) 0.5-2.5 (3) MG/3ML nebulizer solution 3 mL (not administered)  albuterol (PROVENTIL) (2.5 MG/3ML) 0.083% nebulizer solution 5 mg (5 mg Nebulization Given 03/21/17 0123)  ipratropium-albuterol (DUONEB) 0.5-2.5 (3) MG/3ML nebulizer solution 3 mL (3 mLs Nebulization Given 03/21/17 0229)  predniSONE (DELTASONE) tablet 60 mg (60 mg Oral Given 03/21/17 0229)     Initial Impression / Assessment and Plan / ED Course  I have reviewed the triage vital signs and the nursing notes.  Pertinent labs & imaging results that were available during my care of the patient were reviewed by me and considered in my medical decision making (see chart for details).  Acute bronchitis with bronchospasm.  Chest x-ray showed no evidence of pneumonia.  Old records are reviewed confirming recent ED visit with prescription given for azithromycin and hydrocodone-guaifenesin.  She is given additional prednisone and will be given additional albuterol with ipratropium.  She had received one treatment in the ED prior to my seeing her and states she is feeling somewhat better.  3:52 AM She says she is feeling back to baseline, but, on reevaluation, there is still slight wheezing.  I feel  that she is doing well enough to go home, but will be given 1 additional nebulizer treatment prior to discharge.  She is already on appropriate and maximum outpatient treatment and she is to continue her current course including antibiotics and steroids.  Final Clinical Impressions(s) / ED Diagnoses   Final diagnoses:  Acute bronchitis, unspecified organism    ED Discharge Orders    None       Delora Fuel, MD 46/80/32 [phone]

## 2017-03-22 ENCOUNTER — Telehealth: Payer: Self-pay | Admitting: Internal Medicine

## 2017-03-23 MED ORDER — FLUTTER DEVI: 0 refills | Status: AC

## 2017-03-23 NOTE — Telephone Encounter (Signed)
Spoke with patient. She currently has bronchitis and has a productive cough with yellow phlegm. She has been back and forth to the ED for the past few weeks with this. Last ED visit was on Christmas.   She is currently taking a zpak, prednisone taper and OTC mucinex. She was advised to stop taking the hydrocodone cough syrup and Tessalon perles by the ED doctor.   She wants to know if there is anything else she can take to help with her cough and feeling fatigue.   Patient wishes to use [facility].   CY, please advise since MR is not here today. Thanks!

## 2017-03-23 NOTE — Telephone Encounter (Signed)
Pt is aware of below message and voiced hee understanding. Flutter device has been placed up front for pickup. Pt aware that demo is needed. Nothing further is needed.

## 2017-03-23 NOTE — Telephone Encounter (Signed)
Prednisone and Zpak need time to work.   If the problem now is mainly cough control-   Does she still have hydrocodone cough syrup? She may use it. Does she need something specific?

## 2017-03-23 NOTE — Telephone Encounter (Signed)
Called and spoke with pt and she stated the following:  She stated that she cannot take the hydrocodone cough meds as this hurts her stomach too much.  She is currently taking the MS infusion that she will have to stop as she stated that this infusion has messed everything up.    She is using the neb every 4 hours along with the mucinex DM 600 mg  BID---she stated that every time she uses the neb, it feels like her chest is bubbling up and this is what is causing her to cough.  She is wanting to know if there is something that she can take to help clear out her chest.    CY please advise. Thanks  Allergies  Allergen Reactions  . Crestor [Rosuvastatin] Other (See Comments)    Joint pain   . Vicodin [Hydrocodone-Acetaminophen]     Made pass-out one time and is okay taking now  . Isovue [Iopamidol]     Pt had sneezing and itching of her throat and soft palate.  Dr Alvester Chou checked pt.  She will need premeds in the future.  Alfonse Alpers

## 2017-03-23 NOTE — Telephone Encounter (Signed)
She may do better using the nebulizer less often- every 6 or 8 hours.  If she doesn't have a Flutter device, could we provide one:    Blow through 4 blows per set, three sets per day, to help clear her chest.

## 2017-03-29 ENCOUNTER — Telehealth: Payer: Self-pay | Admitting: Internal Medicine

## 2017-03-29 MED ORDER — NYSTATIN 100000 UNIT/ML MT SUSP: 5.0000 mL | Freq: Four times a day (QID) | OROMUCOSAL | 0 refills | Status: DC

## 2017-03-29 NOTE — Telephone Encounter (Signed)
Spoke with pt. States that she is having issues with thrush. Reports last month she was given prednisone from the ED. Prednisone always gives her thrush. She is requesting a refill on Nystatin suspension.  MR - please advise. Thanks.

## 2017-03-29 NOTE — Telephone Encounter (Signed)
Called and spoke with pt letting her know that MR had okayed [place] sending a Rx to her preferred pharmacy to help with the thrush. Pt expressed understanding. Rx sent. Nothing further needed.

## 2017-03-29 NOTE — Telephone Encounter (Signed)
#   Oral thrush - For Oral thrush: Take Suspension (swish and swallow): 500,000 units 4 times/day for 5 days; swish in the mouth and retain for as long as possible (several minutes) before swallowing   Dr. Brand Males, M.D., [facility].C.P Pulmonary and Critical Care Medicine Staff Physician, [place] Director - Interstitial Lung Disease  Program  Pulmonary [place] at [place], [place], [address]  Pager: [phone], If no answer or between  15:00h - 7:00h: call [phone] Telephone: [phone]

## 2017-04-19 ENCOUNTER — Ambulatory Visit: Payer: Medicare Other | Admitting: Internal Medicine

## 2017-04-19 ENCOUNTER — Encounter: Payer: Self-pay | Admitting: Internal Medicine

## 2017-04-19 VITALS — BP 116/70 | HR 73 | Ht 63.0 in | Wt 157.0 lb

## 2017-04-19 DIAGNOSIS — J4540 Moderate persistent asthma, uncomplicated: Secondary | ICD-10-CM | POA: Diagnosis not present

## 2017-04-19 NOTE — Patient Instructions (Addendum)
ICD-10-CM   1. Moderate persistent asthma without complication G86.76     Stable Concerned that Ocrevus might be driving asthma based on your history; but I do not know for sure  Plan - continue symbicort scheduled _ duoneb as needed  - talk to PCP [name], MD and see if you can stop coreg which is a non-specific beta blocker - agree based on ocrevus concern that we should hold off nucala - continue acid reflux treatment - agree that you want to hold off ocrevus in May 2019  followup  - June 2019 with Dr [name]; do ACQ at followup and feno at followup

## 2017-04-19 NOTE — Progress Notes (Signed)
Subjective:     Patient ID: [name], female   DOB: 1945-09-27, 72 y.o.   MRN: [id number]  HPI  OV 11/30/2016  Chief Complaint  Patient presents with  . Follow-up    Pt here after acute visit with TP on 8.31.2018. Pt states she is feeling improved but not back to baseline. Pt states she is still having prod cough with pale yellow mucus, still some SOB. Pt denies CP/tightness, f/c/s.      72 year old female with moderate persistent asthma. Last seen 11/25/2013 by her practitioner. Given prednisone and Z-Pak. Currently she is improved. Pulmonary function test yesterday shows normalized FEV1 and DLCO. Nitric oxide was high 3 weeks ago. She is here with [name]. Her son has graduated from anesthesia residency at the [employer]. She is worried about repeated it takes of steroids. She admitted to noncompliance with Symbicort and Singulair up until May 2018. She is also worried about her monoclonal antibody infusion for multiple sclerosis which she gets every 6 months for the last year or 2. She says after that she gets extremely fatigued and might require a prednisone burst. Occasionally she is not a respiratory infection. However she's been advised by her neurologist to continue with these injections in order to support her multiple sclerosis. There are no other new issues. She had a 7 mm lung nodule that resolved a year ago  72 year old female never smoker followed for moderate persistent asthma and lung nodule. MS on Infusion -OCREVUS every 6 months  Son is an anesthesiologist   01/13/2017 Follow up : Asthma  Pt returns for follow up for Asthma . She was seen on 12/27/16 for asthma bronchitic flare with 3 week hx of productive cough with thick yellow mucus and wheezing . Her FENO testing was elevated . She was treated with Zpack and Prednisone taper. Says she did get better with less cough, congestion and wheezing . She is concerned because she gets intermittent wheezing  especially in am. Goes away after she uses Symbicort.  No fever, discolored mucus, chest pain, orthopnea,.  She remains on Symbicort, Singulair and Zyrtec. Says she is compliant .  She says she and family are concerned regarding steroids use. We went over steroid use and potential side effects and goal is to limit use as much as possible . Last ov Nucala was ordered and is under approval process . This was added to help with improved asthma control .  She is concerned for adrenal insufficiency , discussed that it can occur with prolonged steroid use. She has been on three 1 week steroid tapers over last 4 months . Suggested she discuss this with PCP to have additional testing or referral to endocrinology if indicated.  FENO testing today is slightly improved at 150 .     Acute 01/27/17 72 year old with history of persistent asthma, multiple sclerosis on  complains of increasing dyspnea for the past 1 week associated with yellow mucus, wheezing, chest tightness.  She denies any nebulizer but has not received them yet.  Paperwork is underway for initiation of nucala but has not been started yet.   She has significant history of GERD with esophageal spasm, atypical chest pain.  She follows at [facility] for this.  She is taking Protonix alternating with Zyrtec.     OV 02/08/2017  Chief Complaint  Patient presents with  . Follow-up    Pt last seen by PM on 11.2.2018 for an acute visit. Pt states she started to  imporve but now is feeling like she is catching a cold again. Pt c/o prod cough with yellow mucus. Pt deniee SOB, CP/tihgtness and f/c/s.      72 year old female with eosinophilic and allergic asthma.  Presents for follow-up.  Most recently seen January 27, 2017 by my colleague for asthma exacerbation.  She is on her last day of prednisone today.  She feels improved.  Her nitric oxide has been the lowest it has been in 2018 which is 115 ppb but still significantly elevated.  She feels she is  catching a cold although she feels overall better.  She is on Symbicort schedule and Singulair scheduled.  She is not on any inhaled anticholinergic.  She is awaiting her interleukin-5 receptor antibody treatment.  She is worried about taking this because of cross interference with the other monoclonal antibody that she takes for her multiple sclerosis.  We had a discussion on this.  I did tell her that the cross-reactive and side effect profile is unknown and the general unpredictability can be higher.  Despite this we thought that the overall risk benefit ratio is in favor of her taking her interleukin-5 receptor antibody for asthma.   TEST  CT chest 01/2016 7 mm nodule resolved, stable other nodules , improved tree in bud nodularity  09/2016 IgE 4, neg RAST  09/2016 Eosinophils 300-400  Feno >.150 10/2016 >187 12/27/16  PFT 11/2016 >nml  FEV 1 , no restriction or obstruction , nml DLCO   OV 04/19/2017  Chief Complaint  Patient presents with  . Follow-up    has had 2 ED visit in December 2018 with asthma/bronchitis,no admission,feels like breathing at this time is better,she can tell improvement   Follow-up allergic asthma with eosinophilic asthma but normal IgE  She is here with [name] who runs a daycare for after school kids at [employer] [facility] [employer] [facility].  Her son is no longer at the [employer] but is now working in [place].  She tells me currently her asthma is stable.  She and her daughter are very categorical that the whole asthma started after she started taking monoclonal antibody infusions for her multiple sclerosis.   there is an infusion administered over 6 hours every 6 months.  She first started this infusion around November 2017.  She tells me that she has infusion reactions that start within a day or so of receiving the infusion.  These infusion reactions are characterized by body ache and fever and aggravation of past medical issues such as the meniscus.  She also has throat  irritation and bronchospasm.  Then some 2 weeks later like clockwork she gets acute bronchitis which is then treated as an asthma exacerbation.  She also has significant fatigue.  She says it takes her months to recover from this and she has a normal good 5 months before the cycle is repeated again.  Most recently the infusion was in November 2018 and then she ended up in the ER for asthma exacerbation in December 2018.  She was supposed to start interleukin-5 receptor antibody for asthma that was poorly controlled and because of persistent eosinophilia and high nitric oxide but given the cycle she and her daughter have decided against Nucala especially because she is convinced it is the [place] for multiple sclerosis that is causing this problem.  She is going to skip her next multiple sclerosis infusion in May 2019.  At this point in time she is compliant with her Symbicort.  Med review  shows coreg   Results for LUCILLIE, KIESEL (MRN [id number]) as of 04/19/2017 10:02  Ref. Range 10/07/2016 12:29 11/10/2016 15:22 12/27/2016 17:04 01/27/2017 15:29 02/08/2017 10:30  Nitric Oxide Unknown 180 150 187 146 115    Results for EDITHE, DOBBIN (MRN [id number]) as of 04/19/2017 10:02  Ref. Range 05/09/2009 12:25 05/10/2009 04:32 10/07/2016 13:11 10/07/2016 20:51 03/19/2017 15:45  Eosinophils Absolute Latest Ref Range: 0.0 - 0.7 K/uL 0.0 0.1 0.4 0.3 0.3     has a past medical history of Anemia, Anginal pain ([place]), Arthritis, Complication of anesthesia, Coronary artery disease, Dry eyes, Esophageal spasm, GERD (gastroesophageal reflux disease), and MS (multiple sclerosis) ([place]).   reports that  has never smoked. she has never used smokeless tobacco.  Past Surgical History:  Procedure Laterality Date  . ABDOMINAL HYSTERECTOMY    . BLEPHAROPLASTY Bilateral   . CARDIAC CATHETERIZATION  10/04/06   MINOR CAD,SINGLE VESSEL INVOLVING THE CIRCUMFLEX. 20 TO 30% PROXIMALLY AND 10 TO 20% IN THE MIDDLE SEGMENT.MILD MUSCLE  BRIDGING, MID LAD.NORMAL RCA.NORMAL LV FUNCTION.NORMAL MITRAL AND AORTIC VALVE.NORMAL APPEARING AORTA,THORACIC AND ABDOMINAL.NORMAL RENAL ARTERIES.  [name] CARDIOLOGY NUCLEAR MED STUDY  06/22/12   NL LV FUNCTION,EF 68%,NL WALL MOTION.  [name] CAROTID DUPLEX  07/02/11   NWG:NFAO SOFT PLAQUE NOTED DISTAL CCA AND ORGIN AND PROXIMAL ICA,LEFT>RIGHT.NO ICA STENOSIS. VERTEBRAL ARTERY FLOW IS ANTEGRADE.  [name] CESAREAN SECTION     x2   . COLONOSCOPY WITH PROPOFOL N/A 12/11/2014   Procedure: COLONOSCOPY WITH PROPOFOL;  Surgeon: Ronald Lobo, MD;  Location: [facility] ENDOSCOPY;  Service: Endoscopy;  Laterality: N/A;  . KNEE ARTHROSCOPY Left    scope  . PARATHYROIDECTOMY     partial-many years ago  . thumb surgery Bilateral    built up and bone removal  . TONSILLECTOMY    . TRANSTHORACIC ECHOCARDIOGRAM  07/02/11   LV CAVITY SIZE IS NORMAL. SYSTOLIC FUNCTION WAS NORMAL.EF=55% TO 60%.INCREASED RELATIVE CONTRIBUTION OF ATRIAL CONTRACTION TO VENTRICULAR FILLING;MAYBE DUE TO HYPOVOLEMIA. AV=MILD REGURG.    Allergies  Allergen Reactions  . Crestor [Rosuvastatin] Other (See Comments)    Joint pain   . Vicodin [Hydrocodone-Acetaminophen]     Made pass-out one time and is okay taking now  . Isovue [Iopamidol]     Pt had sneezing and itching of her throat and soft palate.  Dr Alvester Chou checked pt.  She will need premeds in the future.  J Bohm    Immunization History  Administered Date(s) Administered  . Influenza, High Dose Seasonal PF 12/22/2013, 12/14/2015, 12/14/2016  . Influenza,inj,Quad PF,6+ Mos 11/27/2014  . Pneumococcal-Unspecified 10/26/2013    Family History  Problem Relation Age of Onset  . Heart disease Mother   . Asthma Father   . Bladder Cancer Father   . Heart failure Father   . Hyperlipidemia Brother      Current Outpatient Medications:  .  albuterol (PROVENTIL HFA;VENTOLIN HFA) 108 (90 Base) MCG/ACT inhaler, Inhale 1-2 puffs into the lungs every 6 (six) hours as needed for wheezing or shortness of  breath., Disp: 1 Inhaler, Rfl: 0 .  amLODipine (NORVASC) 5 MG tablet, TAKE 1 TABLET ONCE DAILY., Disp: 30 tablet, Rfl: 11 .  aspirin EC 81 MG tablet, Take 1 tablet (81 mg total) by mouth daily., Disp: 90 tablet, Rfl: 3 .  benzonatate (TESSALON) 200 MG capsule, Take 1 capsule (200 mg total) by mouth 3 (three) times daily as needed for cough., Disp: 45 capsule, Rfl: 3 .  budesonide-formoterol (SYMBICORT) 160-4.5 MCG/ACT inhaler, Inhale 2 puffs into the  lungs 2 (two) times daily., Disp: 1 Inhaler, Rfl: 5 .  calcium-vitamin D (OSCAL WITH D) 500-200 MG-UNIT tablet, Take 1 tablet by mouth daily., Disp: , Rfl:  .  carvedilol (COREG) 6.25 MG tablet, TAKE 1 TABLET TWICE DAILY WITH FOOD., Disp: 60 tablet, Rfl: 8 .  cetirizine (ZYRTEC ALLERGY) 10 MG tablet, Take 1 tablet (10 mg total) by mouth daily., Disp: 30 tablet, Rfl: 5 .  Cholecalciferol (VITAMIN D PO), Take 1 tablet by mouth daily., Disp: , Rfl:  .  CINNAMON PO, Take 1 tablet by mouth daily., Disp: , Rfl:  .  dextromethorphan-guaiFENesin (MUCINEX DM) 30-600 MG 12hr tablet, Take 1 tablet by mouth 2 (two) times daily as needed for cough., Disp: , Rfl:  .  DM-Phenylephrine-Acetaminophen (VICKS DAYQUIL COLD & FLU) 10-5-325 MG CAPS, Take 2 capsules by mouth once., Disp: , Rfl:  .  hyoscyamine (LEVSIN, ANASPAZ) 0.125 MG tablet, Take 0.125 mg by mouth every 4 (four) hours as needed (for spasms). , Disp: , Rfl:  .  Ipratropium-Albuterol (COMBIVENT RESPIMAT) 20-100 MCG/ACT AERS respimat, Inhale 1 puff into the lungs every 6 (six) hours as needed for wheezing., Disp: , Rfl:  .  isosorbide mononitrate (IMDUR) 60 MG 24 hr tablet, TAKE 1 TABLET ONCE DAILY., Disp: 30 tablet, Rfl: 11 .  levothyroxine (SYNTHROID, LEVOTHROID) 25 MCG tablet, Take 25 mcg daily before breakfast by mouth., Disp: , Rfl:  .  montelukast (SINGULAIR) 10 MG tablet, Take 1 tablet (10 mg total) by mouth at bedtime., Disp: 30 tablet, Rfl: 11 .  Multiple Vitamin (MULTIVITAMIN WITH MINERALS) TABS  tablet, Take 1 tablet by mouth daily., Disp: , Rfl:  .  nitroGLYCERIN (NITROSTAT) 0.4 MG SL tablet, Place 1 tablet (0.4 mg total) under the tongue every 5 (five) minutes as needed for chest pain., Disp: 25 tablet, Rfl: 3 .  ocrelizumab (OCREVUS) 300 MG/10ML injection, Inject 600 mg into the vein every 6 (six) months., Disp: , Rfl:  .  pantoprazole (PROTONIX) 20 MG tablet, Take 1 tablet (20 mg total) by mouth 2 (two) times daily., Disp: 60 tablet, Rfl: 3 .  PARoxetine (PAXIL) 10 MG tablet, Take 5 mg by mouth daily. , Disp: , Rfl:  .  Respiratory Therapy Supplies (FLUTTER) DEVI, Use as directed, Disp: 1 each, Rfl: 0 .  TURMERIC PO, Take 1 tablet by mouth daily., Disp: , Rfl:  .  VYTORIN 10-20 MG per tablet, Take 1 tablet by mouth daily. , Disp: , Rfl:      Review of Systems  Review of Systems     Objective:   Physical Exam  Constitutional: She is oriented to person, place, and time. She appears well-developed and well-nourished. No distress.  HENT:  Head: Normocephalic and atraumatic.  Right Ear: External ear normal.  Left Ear: External ear normal.  Mouth/Throat: Oropharynx is clear and moist. No oropharyngeal exudate.  Eyes: Conjunctivae and EOM are normal. Pupils are equal, round, and reactive to light. Right eye exhibits no discharge. Left eye exhibits no discharge. No scleral icterus.  Neck: Normal range of motion. Neck supple. No JVD present. No tracheal deviation present. No thyromegaly present.  Cardiovascular: Normal rate, regular rhythm, normal heart sounds and intact distal pulses. Exam reveals no gallop and no friction rub.  No murmur heard. Pulmonary/Chest: Effort normal and breath sounds normal. No respiratory distress. She has no wheezes. She has no rales. She exhibits no tenderness.  Abdominal: Soft. Bowel sounds are normal. She exhibits no distension and no mass. There is no  tenderness. There is no rebound and no guarding.  Musculoskeletal: Normal range of motion. She  exhibits no edema or tenderness.  Lymphadenopathy:    She has no cervical adenopathy.  Neurological: She is alert and oriented to person, place, and time. She has normal reflexes. No cranial nerve deficit. She exhibits normal muscle tone. Coordination normal.  Skin: Skin is warm and dry. No rash noted. She is not diaphoretic. No erythema. No pallor.  Psychiatric: She has a normal mood and affect. Her behavior is normal. Judgment and thought content normal.  Vitals reviewed.  Vitals:   04/19/17 0959  BP: 116/70  Pulse: 73  SpO2: 95%   Vitals:   04/19/17 0959  BP: 116/70  Pulse: 73  SpO2: 95%  Weight: 157 lb (71.2 kg)  Height: 5\' 3"  (1.6 m)       Assessment:       ICD-10-CM   1. Moderate persistent asthma without complication O45.99        Plan:       Stable Concerned that Ocrevus might be driving asthma based on your history; but I do not know for sure  Plan - continue symbicort scheduled _ duoneb as needed  - talk to PCP [name], MD and see if you can stop coreg which is a non-specific beta blocker - agree based on ocrevus concern that we should hold off nucala - continue acid reflux treatment - agree that you want to hold off ocrevus in May 2019  followup  - June 2019 with Dr [name]; do ACQ at followup and feno at followup    > 50% of this > 25 min visit spent in face to face counseling or coordination of care   Dr. Brand Males, M.D., [facility].C.P Pulmonary and Critical Care Medicine Staff Physician, [place] Director - Interstitial Lung Disease  Program  Pulmonary [place] at [place], [place], [address]  Pager: [phone], If no answer or between  15:00h - 7:00h: call [phone] Telephone: [phone]

## 2017-04-24 ENCOUNTER — Other Ambulatory Visit: Payer: Self-pay | Admitting: Cardiovascular Disease

## 2017-05-10 ENCOUNTER — Encounter: Payer: Self-pay | Admitting: Internal Medicine

## 2017-05-10 ENCOUNTER — Ambulatory Visit: Payer: Medicare Other | Admitting: Internal Medicine

## 2017-05-10 ENCOUNTER — Telehealth: Payer: Self-pay | Admitting: Internal Medicine

## 2017-05-10 VITALS — BP 118/70 | HR 87 | Temp 97.7°F | Ht 63.0 in | Wt 156.8 lb

## 2017-05-10 DIAGNOSIS — J82 Pulmonary eosinophilia, not elsewhere classified: Secondary | ICD-10-CM

## 2017-05-10 DIAGNOSIS — J4541 Moderate persistent asthma with (acute) exacerbation: Secondary | ICD-10-CM

## 2017-05-10 DIAGNOSIS — J8283 Eosinophilic asthma: Secondary | ICD-10-CM

## 2017-05-10 MED ORDER — FLUTICASONE FUROATE-VILANTEROL 200-25 MCG/INH IN AEPB: 1.0000 | INHALATION_SPRAY | Freq: Every day | RESPIRATORY_TRACT | 5 refills | Status: DC

## 2017-05-10 MED ORDER — PREDNISONE 10 MG PO TABS: ORAL_TABLET | ORAL | 0 refills | Status: DC

## 2017-05-10 MED ORDER — TIOTROPIUM BROMIDE MONOHYDRATE 2.5 MCG/ACT IN AERS: 2.0000 | INHALATION_SPRAY | Freq: Every day | RESPIRATORY_TRACT | 5 refills | Status: DC

## 2017-05-10 NOTE — Telephone Encounter (Signed)
Per MR, pt needs to have coreg changed to a calcium channel blocker.  Called Dr. Katrine Coho office and spoke with the receptionist Keenan Bachelor letting her know I needed to speak with Dr. Pennie Banter nurse regarding mutual pt.  Keenan Bachelor told me she would send a message to Dr. Pennie Banter nurse to have her return my call.  Will leave this encounter open until this is taken care of.

## 2017-05-10 NOTE — Addendum Note (Signed)
Addended by: Lorretta Harp on: 05/10/2017 12:37 PM   Modules accepted: Orders

## 2017-05-10 NOTE — Progress Notes (Signed)
Subjective:     Patient ID: [name], female   DOB: [birth year], 72 y.o.   MRN: [id number]  HPI   OV 11/30/2016  Chief Complaint  Patient presents with  . Follow-up    Pt here after acute visit with TP on 8.31.2018. Pt states she is feeling improved but not back to baseline. Pt states she is still having prod cough with pale yellow mucus, still some SOB. Pt denies CP/tightness, f/c/s.      72 year old female with moderate persistent asthma. Last seen 11/25/2013 by her practitioner. Given prednisone and Z-Pak. Currently she is improved. Pulmonary function test yesterday shows normalized FEV1 and DLCO. Nitric oxide was high 3 weeks ago. She is here with [name]. Her son has graduated from anesthesia residency at the [employer]. She is worried about repeated it takes of steroids. She admitted to noncompliance with Symbicort and Singulair up until May 2018. She is also worried about her monoclonal antibody infusion for multiple sclerosis which she gets every 6 months for the last year or 2. She says after that she gets extremely fatigued and might require a prednisone burst. Occasionally she is not a respiratory infection. However she's been advised by her neurologist to continue with these injections in order to support her multiple sclerosis. There are no other new issues. She had a 7 mm lung nodule that resolved a year ago  72 year old female never smoker followed for moderate persistent asthma and lung nodule. MS on Infusion -OCREVUS every 6 months  Son is an anesthesiologist   01/13/2017 Follow up : Asthma  Pt returns for follow up for Asthma . She was seen on 12/27/16 for asthma bronchitic flare with 3 week hx of productive cough with thick yellow mucus and wheezing . Her FENO testing was elevated . She was treated with Zpack and Prednisone taper. Says she did get better with less cough, congestion and wheezing . She is concerned because she gets intermittent wheezing  especially in am. Goes away after she uses Symbicort.  No fever, discolored mucus, chest pain, orthopnea,.  She remains on Symbicort, Singulair and Zyrtec. Says she is compliant .  She says she and family are concerned regarding steroids use. We went over steroid use and potential side effects and goal is to limit use as much as possible . Last ov Nucala was ordered and is under approval process . This was added to help with improved asthma control .  She is concerned for adrenal insufficiency , discussed that it can occur with prolonged steroid use. She has been on three 1 week steroid tapers over last 4 months . Suggested she discuss this with PCP to have additional testing or referral to endocrinology if indicated.  FENO testing today is slightly improved at 150 .     Acute 01/27/17 72 year old with history of persistent asthma, multiple sclerosis on  complains of increasing dyspnea for the past 1 week associated with yellow mucus, wheezing, chest tightness.  She denies any nebulizer but has not received them yet.  Paperwork is underway for initiation of nucala but has not been started yet.   She has significant history of GERD with esophageal spasm, atypical chest pain.  She follows at [facility] for this.  She is taking Protonix alternating with Zyrtec.     OV 02/08/2017  Chief Complaint  Patient presents with  . Follow-up    Pt last seen by PM on 11.2.2018 for an acute visit. Pt states she started  to imporve but now is feeling like she is catching a cold again. Pt c/o prod cough with yellow mucus. Pt deniee SOB, CP/tihgtness and f/c/s.      72 year old female with eosinophilic and allergic asthma.  Presents for follow-up.  Most recently seen January 27, 2017 by my colleague for asthma exacerbation.  She is on her last day of prednisone today.  She feels improved.  Her nitric oxide has been the lowest it has been in 2018 which is 115 ppb but still significantly elevated.  She feels she is  catching a cold although she feels overall better.  She is on Symbicort schedule and Singulair scheduled.  She is not on any inhaled anticholinergic.  She is awaiting her interleukin-5 receptor antibody treatment.  She is worried about taking this because of cross interference with the other monoclonal antibody that she takes for her multiple sclerosis.  We had a discussion on this.  I did tell her that the cross-reactive and side effect profile is unknown and the general unpredictability can be higher.  Despite this we thought that the overall risk benefit ratio is in favor of her taking her interleukin-5 receptor antibody for asthma.   TEST  CT chest 01/2016 7 mm nodule resolved, stable other nodules , improved tree in bud nodularity  09/2016 IgE 4, neg RAST  09/2016 Eosinophils 300-400  Feno >.150 10/2016 >187 12/27/16  PFT 11/2016 >nml  FEV 1 , no restriction or obstruction , nml DLCO   OV 04/19/2017  Chief Complaint  Patient presents with  . Follow-up    has had 2 ED visit in December 2018 with asthma/bronchitis,no admission,feels like breathing at this time is better,she can tell improvement   Follow-up allergic asthma with eosinophilic asthma but normal IgE  She is here with [name] who runs a daycare for after school kids at [employer] [facility] [employer].  Her son is no longer at the [employer] but is now working in [employer].  She tells me currently her asthma is stable.  She and her daughter are very categorical that the whole asthma started after she started taking monoclonal antibody infusions for her multiple sclerosis.   there is an infusion administered over 6 hours every 6 months.  She first started this infusion around November 2017.  She tells me that she has infusion reactions that start within a day or so of receiving the infusion.  These infusion reactions are characterized by body ache and fever and aggravation of past medical issues such as the meniscus.  She also has throat  irritation and bronchospasm.  Then some 2 weeks later like clockwork she gets acute bronchitis which is then treated as an asthma exacerbation.  She also has significant fatigue.  She says it takes her months to recover from this and she has a normal good 5 months before the cycle is repeated again.  Most recently the infusion was in November 2018 and then she ended up in the ER for asthma exacerbation in December 2018.  She was supposed to start interleukin-5 receptor antibody for asthma that was poorly controlled and because of persistent eosinophilia and high nitric oxide but given the cycle she and her daughter have decided against Nucala especially because she is convinced it is the [facility] for multiple sclerosis that is causing this problem.  She is going to skip her next multiple sclerosis infusion in May 2019.  At this point in time she is compliant with her Symbicort.  Med  review shows coreg   OV 05/10/2017  Chief Complaint  Patient presents with  . Acute Visit    Pt has complaints of coughing with thick yellow mucus and occ. dark brown mucus along with wheezing and hoarseness. Pt denies any fever.   42-year-old female with asthma eosinophilic and moderate persistent on Symbicort.  She presents for an acute visit.  Since seeing me 3 weeks ago she had gradual deterioration in symptoms.  She is having significant nocturnal symptoms.  Her friends are telling her that her voice is hoarse.  She says she is compliant with her Symbicort and Singulair.  Her interleukin-5 receptor antibody is on hold because she is worried about interactions with the monoclonal antibody for multiple sclerosis still in her system and the washout of that is expected only June 2019.  Currently her asthma control questionnaire shows a score of 3.6 showing significant symptoms.  She is waking up a few times at night because of asthma symptoms and when she wakes up she has moderate symptoms.  She is moderately limited in her  activities because of asthma.  She is short of breath quite a bit and wheezing all the time although she hold off on using her albuterol for rescue.  Med review shows that she is on nonspecific beta blocker carvedilol.  There is no fever or chills.   Results for DESTENIE, INGBER (MRN [id number]) as of 04/19/2017 10:02  Ref. Range 10/07/2016 12:29 11/10/2016 15:22 12/27/2016 17:04 01/27/2017 15:29 02/08/2017 10:30 05/10/2017   Nitric Oxide Unknown 180 150 187 146 115 153  ACQ score       3.6     Results for VIRNA, LIVENGOOD (MRN [id number]) as of 04/19/2017 10:02  Ref. Range 05/09/2009 12:25 05/10/2009 04:32 10/07/2016 13:11 10/07/2016 20:51 03/19/2017 15:45  Eosinophils Absolute Latest Ref Range: 0.0 - 0.7 K/uL 0.0 0.1 0.4 0.3 0.3   Results for DEVRI, KREHER (MRN [id number]) as of 05/10/2017 12:14  Ref. Range 10/07/2016 13:11  IgE (Immunoglobulin E), Serum Latest Ref Range: <115 kU/L 4      has a past medical history of Anemia, Anginal pain (HCC), Arthritis, Complication of anesthesia, Coronary artery disease, Dry eyes, Esophageal spasm, GERD (gastroesophageal reflux disease), and MS (multiple sclerosis) ([place]).   reports that  has never smoked. she has never used smokeless tobacco.  Past Surgical History:  Procedure Laterality Date  . ABDOMINAL HYSTERECTOMY    . BLEPHAROPLASTY Bilateral   . CARDIAC CATHETERIZATION  10/04/06   MINOR CAD,SINGLE VESSEL INVOLVING THE CIRCUMFLEX. 20 TO 30% PROXIMALLY AND 10 TO 20% IN THE MIDDLE SEGMENT.MILD MUSCLE BRIDGING, MID LAD.NORMAL RCA.NORMAL LV FUNCTION.NORMAL MITRAL AND AORTIC VALVE.NORMAL APPEARING AORTA,THORACIC AND ABDOMINAL.NORMAL RENAL ARTERIES.  [name] CARDIOLOGY NUCLEAR MED STUDY  06/22/12   NL LV FUNCTION,EF 68%,NL WALL MOTION.  [name] CAROTID DUPLEX  07/02/11   DZH:GDJM SOFT PLAQUE NOTED DISTAL CCA AND ORGIN AND PROXIMAL ICA,LEFT>RIGHT.NO ICA STENOSIS. VERTEBRAL ARTERY FLOW IS ANTEGRADE.  [name] CESAREAN SECTION     x2   . COLONOSCOPY WITH PROPOFOL N/A 12/11/2014    Procedure: COLONOSCOPY WITH PROPOFOL;  Surgeon: Ronald Lobo, MD;  Location: [facility];  Service: Endoscopy;  Laterality: N/A;  . KNEE ARTHROSCOPY Left    scope  . PARATHYROIDECTOMY     partial-many years ago  . thumb surgery Bilateral    built up and bone removal  . TONSILLECTOMY    . TRANSTHORACIC ECHOCARDIOGRAM  07/02/11   LV CAVITY SIZE IS NORMAL. SYSTOLIC FUNCTION WAS NORMAL.EF=55% TO 60%.INCREASED  RELATIVE CONTRIBUTION OF ATRIAL CONTRACTION TO VENTRICULAR FILLING;MAYBE DUE TO HYPOVOLEMIA. AV=MILD REGURG.    Allergies  Allergen Reactions  . Crestor [Rosuvastatin] Other (See Comments)    Joint pain   . Vicodin [Hydrocodone-Acetaminophen]     Made pass-out one time and is okay taking now  . Isovue [Iopamidol]     Pt had sneezing and itching of her throat and soft palate.  Dr Alvester Chou checked pt.  She will need premeds in the future.  J Bohm    Immunization History  Administered Date(s) Administered  . Influenza, High Dose Seasonal PF 12/22/2013, 12/14/2015, 12/14/2016  . Influenza,inj,Quad PF,6+ Mos 11/27/2014  . Pneumococcal-Unspecified 10/26/2013    Family History  Problem Relation Age of Onset  . Heart disease Mother   . Asthma Father   . Bladder Cancer Father   . Heart failure Father   . Hyperlipidemia Brother      Current Outpatient Medications:  .  albuterol (PROVENTIL HFA;VENTOLIN HFA) 108 (90 Base) MCG/ACT inhaler, Inhale 1-2 puffs into the lungs every 6 (six) hours as needed for wheezing or shortness of breath., Disp: 1 Inhaler, Rfl: 0 .  amLODipine (NORVASC) 5 MG tablet, TAKE 1 TABLET ONCE DAILY., Disp: 30 tablet, Rfl: 11 .  aspirin EC 81 MG tablet, Take 1 tablet (81 mg total) by mouth daily., Disp: 90 tablet, Rfl: 3 .  benzonatate (TESSALON) 200 MG capsule, Take 1 capsule (200 mg total) by mouth 3 (three) times daily as needed for cough., Disp: 45 capsule, Rfl: 3 .  budesonide-formoterol (SYMBICORT) 160-4.5 MCG/ACT inhaler, Inhale 2 puffs into the lungs 2  (two) times daily., Disp: 1 Inhaler, Rfl: 5 .  calcium-vitamin D (OSCAL WITH D) 500-200 MG-UNIT tablet, Take 1 tablet by mouth daily., Disp: , Rfl:  .  carvedilol (COREG) 6.25 MG tablet, TAKE 1 TABLET TWICE DAILY WITH FOOD., Disp: 60 tablet, Rfl: 0 .  cetirizine (ZYRTEC ALLERGY) 10 MG tablet, Take 1 tablet (10 mg total) by mouth daily., Disp: 30 tablet, Rfl: 5 .  Cholecalciferol (VITAMIN D PO), Take 1 tablet by mouth daily., Disp: , Rfl:  .  CINNAMON PO, Take 1 tablet by mouth daily., Disp: , Rfl:  .  hyoscyamine (LEVSIN, ANASPAZ) 0.125 MG tablet, Take 0.125 mg by mouth every 4 (four) hours as needed (for spasms). , Disp: , Rfl:  .  Ipratropium-Albuterol (COMBIVENT RESPIMAT) 20-100 MCG/ACT AERS respimat, Inhale 1 puff into the lungs every 6 (six) hours as needed for wheezing., Disp: , Rfl:  .  isosorbide mononitrate (IMDUR) 60 MG 24 hr tablet, TAKE 1 TABLET ONCE DAILY., Disp: 30 tablet, Rfl: 11 .  levothyroxine (SYNTHROID, LEVOTHROID) 25 MCG tablet, Take 25 mcg daily before breakfast by mouth., Disp: , Rfl:  .  montelukast (SINGULAIR) 10 MG tablet, Take 1 tablet (10 mg total) by mouth at bedtime., Disp: 30 tablet, Rfl: 11 .  Multiple Vitamin (MULTIVITAMIN WITH MINERALS) TABS tablet, Take 1 tablet by mouth daily., Disp: , Rfl:  .  ocrelizumab (OCREVUS) 300 MG/10ML injection, Inject 600 mg into the vein every 6 (six) months., Disp: , Rfl:  .  pantoprazole (PROTONIX) 20 MG tablet, Take 1 tablet (20 mg total) by mouth 2 (two) times daily., Disp: 60 tablet, Rfl: 3 .  PARoxetine (PAXIL) 10 MG tablet, Take 5 mg by mouth daily. , Disp: , Rfl:  .  Respiratory Therapy Supplies (FLUTTER) DEVI, Use as directed, Disp: 1 each, Rfl: 0 .  TURMERIC PO, Take 1 tablet by mouth daily., Disp: , Rfl:  .  VYTORIN 10-20 MG per tablet, Take 1 tablet by mouth daily. , Disp: , Rfl:  .  nitroGLYCERIN (NITROSTAT) 0.4 MG SL tablet, Place 1 tablet (0.4 mg total) under the tongue every 5 (five) minutes as needed for chest pain.  (Patient not taking: Reported on 05/10/2017), Disp: 25 tablet, Rfl: 3   Review of Systems     Objective:   Physical Exam  Constitutional: She is oriented to person, place, and time. She appears well-developed and well-nourished. No distress.  HENT:  Head: Normocephalic and atraumatic.  Right Ear: External ear normal.  Left Ear: External ear normal.  Mouth/Throat: Oropharynx is clear and moist. No oropharyngeal exudate.  Eyes: Conjunctivae and EOM are normal. Pupils are equal, round, and reactive to light. Right eye exhibits no discharge. Left eye exhibits no discharge. No scleral icterus.  Neck: Normal range of motion. Neck supple. No JVD present. No tracheal deviation present. No thyromegaly present.  Cardiovascular: Normal rate, regular rhythm, normal heart sounds and intact distal pulses. Exam reveals no gallop and no friction rub.  No murmur heard. Pulmonary/Chest: Effort normal and breath sounds normal. No respiratory distress. She has no wheezes. She has no rales. She exhibits no tenderness.  occassional scattered faint anterior wheeze  Abdominal: Soft. Bowel sounds are normal. She exhibits no distension and no mass. There is no tenderness. There is no rebound and no guarding.  Musculoskeletal: Normal range of motion. She exhibits no edema or tenderness.  Lymphadenopathy:    She has no cervical adenopathy.  Neurological: She is alert and oriented to person, place, and time. She has normal reflexes. No cranial nerve deficit. She exhibits normal muscle tone. Coordination normal.  Skin: Skin is warm and dry. No rash noted. She is not diaphoretic. No erythema. No pallor.  Psychiatric: She has a normal mood and affect. Her behavior is normal. Judgment and thought content normal.  Vitals reviewed.  Vitals:   05/10/17 1205  BP: 118/70  Pulse: 87  Temp: 97.7 F (36.5 C)  TempSrc: Oral  SpO2: 94%  Weight: 156 lb 12.8 oz (71.1 kg)  Height: 5\' 3"  (1.6 m)    Estimated body mass  index is 27.78 kg/m as calculated from the following:   Height as of this encounter: 5\' 3"  (1.6 m).   Weight as of this encounter: 156 lb 12.8 oz (71.1 kg).     Assessment:       ICD-10-CM   1. Eosinophilic asthma ([place]) V40   2. Moderate persistent asthma with exacerbation J45.41     Gradual  deterioration in symptoms since last visit 3 weeks ago Probably due to significant asthma inflammation for which symbicorr is not enough With each passing day it looks less like is Ocrevus related thought it could have unmmasked your asthma beyond point of no return     Plan:       Plan - Take prednisone 40 mg daily x 2 days, then 20mg  daily x 2 days, then 10mg  daily x 2 days, then 5mg  daily x 2 days and stop - chagne symbicort to high dose breo daily  -My Elpidio Galea to call PCP [name], MD and change coreg to a calcium channel blocker - start spiriva respimat daily - continue singulair - continue acid reflux treatment - agree that you want to hold off ocrevus in May 2019 - Attend to home environment  - duct cleaning  - allergen avoidance - use high end hepa filter   - if above measures do  not help then reinitiate consideration for nucala (which is currently on hold)  followup  - 4 weeks with Dr [name] or an APP; ACQ and FeNO at followup   Dr. Brand Males, M.D., [facility]C.P Pulmonary and Critical Care Medicine Staff Physician, [place] Director - Interstitial Lung Disease  Program  Pulmonary [place] [facility] [place], [place], [address]  Pager: [phone], If no answer or between  15:00h - 7:00h: call [phone] Telephone: [phone]

## 2017-05-10 NOTE — Telephone Encounter (Signed)
Dr. Pennie Banter office returned my call and I specified to them that we were needing to change the coreg to a calcium channel blocker.  They expressed understanding. Nothing further needed at this time.

## 2017-05-10 NOTE — Patient Instructions (Addendum)
ICD-10-CM   1. Eosinophilic asthma ([place]) C12   2. Moderate persistent asthma with exacerbation J45.41    Gradual  deterioration in symptoms since last visit 3 weeks ago Probably due to significant asthma inflammation   Plan - Take prednisone 40 mg daily x 2 days, then 20mg  daily x 2 days, then 10mg  daily x 2 days, then 5mg  daily x 2 days and stop - chagne symbicort to high dose breo daily  -My Elpidio Galea to call PCP [name], MD and change coreg to a calcium channel blocker - start spiriva respimat daily - continue singulair - continue acid reflux treatment - agree that you want to hold off ocrevus in May 2019 - Attend to home environment  - duct cleaning  - allergen avoidance - use high end hepa filter   - if above measures do not help then reinitiate consideration for nucala (which is currently on hold)  followup  - 4 weeks with Dr [name] or an APP; ACQ and FeNO at followup

## 2017-05-10 NOTE — Addendum Note (Signed)
Addended by: Lorretta Harp on: 05/10/2017 12:38 PM   Modules accepted: Orders

## 2017-05-11 LAB — NITRIC OXIDE: Nitric Oxide: 153

## 2017-05-11 NOTE — Addendum Note (Signed)
Addended by: Lorretta Harp on: 05/11/2017 02:26 PM   Modules accepted: Orders

## 2017-05-28 ENCOUNTER — Other Ambulatory Visit: Payer: Self-pay | Admitting: Cardiovascular Disease

## 2017-06-08 ENCOUNTER — Ambulatory Visit: Payer: Medicare Other | Admitting: Internal Medicine

## 2017-06-08 ENCOUNTER — Ambulatory Visit (INDEPENDENT_AMBULATORY_CARE_PROVIDER_SITE_OTHER)
Admission: RE | Admit: 2017-06-08 | Discharge: 2017-06-08 | Disposition: A | Discharge status: Home / Self Care | Payer: Medicare Other | Source: Ambulatory Visit | Attending: Internal Medicine | Admitting: Internal Medicine

## 2017-06-08 ENCOUNTER — Other Ambulatory Visit: Payer: Medicare Other

## 2017-06-08 ENCOUNTER — Encounter: Payer: Self-pay | Admitting: Internal Medicine

## 2017-06-08 VITALS — BP 122/74 | HR 97 | Ht 63.0 in | Wt 156.2 lb

## 2017-06-08 DIAGNOSIS — J82 Pulmonary eosinophilia, not elsewhere classified: Secondary | ICD-10-CM

## 2017-06-08 DIAGNOSIS — J4541 Moderate persistent asthma with (acute) exacerbation: Secondary | ICD-10-CM

## 2017-06-08 DIAGNOSIS — J8283 Eosinophilic asthma: Secondary | ICD-10-CM

## 2017-06-08 LAB — NITRIC OXIDE: NITRIC OXIDE: 156

## 2017-06-08 MED ORDER — MOMETASONE FURO-FORMOTEROL FUM 200-5 MCG/ACT IN AERO: 2.0000 | INHALATION_SPRAY | Freq: Two times a day (BID) | RESPIRATORY_TRACT | 5 refills | Status: DC

## 2017-06-08 MED ORDER — PREDNISONE 10 MG PO TABS: ORAL_TABLET | ORAL | 0 refills | Status: DC

## 2017-06-08 NOTE — Progress Notes (Signed)
Subjective:     Patient ID: [name], female   DOB: Dec 20, 1945, 72 y.o.   MRN: [id number]  HPI  OV 11/30/2016  Chief Complaint  Patient presents with  . Follow-up    Pt here after acute visit with TP on 8.31.2018. Pt states she is feeling improved but not back to baseline. Pt states she is still having prod cough with pale yellow mucus, still some SOB. Pt denies CP/tightness, f/c/s.      72 year old female with moderate persistent asthma. Last seen 11/25/2013 by her practitioner. Given prednisone and Z-Pak. Currently she is improved. Pulmonary function test yesterday shows normalized FEV1 and DLCO. Nitric oxide was high 3 weeks ago. She is here with [name]. Her son has graduated from anesthesia residency at the [employer]. She is worried about repeated it takes of steroids. She admitted to noncompliance with Symbicort and Singulair up until May 2018. She is also worried about her monoclonal antibody infusion for multiple sclerosis which she gets every 6 months for the last year or 2. She says after that she gets extremely fatigued and might require a prednisone burst. Occasionally she is not a respiratory infection. However she's been advised by her neurologist to continue with these injections in order to support her multiple sclerosis. There are no other new issues. She had a 7 mm lung nodule that resolved a year ago  72 year old female never smoker followed for moderate persistent asthma and lung nodule. MS on Infusion -OCREVUS every 6 months  Son is an anesthesiologist   01/13/2017 Follow up : Asthma  Pt returns for follow up for Asthma . She was seen on 12/27/16 for asthma bronchitic flare with 3 week hx of productive cough with thick yellow mucus and wheezing . Her FENO testing was elevated . She was treated with Zpack and Prednisone taper. Says she did get better with less cough, congestion and wheezing . She is concerned because she gets intermittent wheezing  especially in am. Goes away after she uses Symbicort.  No fever, discolored mucus, chest pain, orthopnea,.  She remains on Symbicort, Singulair and Zyrtec. Says she is compliant .  She says she and family are concerned regarding steroids use. We went over steroid use and potential side effects and goal is to limit use as much as possible . Last ov Nucala was ordered and is under approval process . This was added to help with improved asthma control .  She is concerned for adrenal insufficiency , discussed that it can occur with prolonged steroid use. She has been on three 1 week steroid tapers over last 4 months . Suggested she discuss this with PCP to have additional testing or referral to endocrinology if indicated.  FENO testing today is slightly improved at 150 .     Acute 01/27/17 72 year old with history of persistent asthma, multiple sclerosis on  complains of increasing dyspnea for the past 1 week associated with yellow mucus, wheezing, chest tightness.  She denies any nebulizer but has not received them yet.  Paperwork is underway for initiation of nucala but has not been started yet.   She has significant history of GERD with esophageal spasm, atypical chest pain.  She follows at [facility] for this.  She is taking Protonix alternating with Zyrtec.     OV 02/08/2017  Chief Complaint  Patient presents with  . Follow-up    Pt last seen by PM on 11.2.2018 for an acute visit. Pt states she started to  imporve but now is feeling like she is catching a cold again. Pt c/o prod cough with yellow mucus. Pt deniee SOB, CP/tihgtness and f/c/s.      72 year old female with eosinophilic and allergic asthma.  Presents for follow-up.  Most recently seen January 27, 2017 by my colleague for asthma exacerbation.  She is on her last day of prednisone today.  She feels improved.  Her nitric oxide has been the lowest it has been in 2018 which is 115 ppb but still significantly elevated.  She feels she is  catching a cold although she feels overall better.  She is on Symbicort schedule and Singulair scheduled.  She is not on any inhaled anticholinergic.  She is awaiting her interleukin-5 receptor antibody treatment.  She is worried about taking this because of cross interference with the other monoclonal antibody that she takes for her multiple sclerosis.  We had a discussion on this.  I did tell her that the cross-reactive and side effect profile is unknown and the general unpredictability can be higher.  Despite this we thought that the overall risk benefit ratio is in favor of her taking her interleukin-5 receptor antibody for asthma.   TEST  CT chest 01/2016 7 mm nodule resolved, stable other nodules , improved tree in bud nodularity  09/2016 IgE 4, neg RAST  09/2016 Eosinophils 300-400  Feno >.150 10/2016 >187 12/27/16  PFT 11/2016 >nml  FEV 1 , no restriction or obstruction , nml DLCO   OV 04/19/2017  Chief Complaint  Patient presents with  . Follow-up    has had 2 ED visit in December 2018 with asthma/bronchitis,no admission,feels like breathing at this time is better,she can tell improvement   Follow-up allergic asthma with eosinophilic asthma but normal IgE  She is here with [name] who runs a daycare for after school kids at [facility].  Her son is no longer at the [employer] but is now working in [place].  She tells me currently her asthma is stable.  She and her daughter are very categorical that the whole asthma started after she started taking monoclonal antibody infusions for her multiple sclerosis.   there is an infusion administered over 6 hours every 6 months.  She first started this infusion around November 2017.  She tells me that she has infusion reactions that start within a day or so of receiving the infusion.  These infusion reactions are characterized by body ache and fever and aggravation of past medical issues such as the meniscus.  She also has throat  irritation and bronchospasm.  Then some 2 weeks later like clockwork she gets acute bronchitis which is then treated as an asthma exacerbation.  She also has significant fatigue.  She says it takes her months to recover from this and she has a normal good 5 months before the cycle is repeated again.  Most recently the infusion was in November 2018 and then she ended up in the ER for asthma exacerbation in December 2018.  She was supposed to start interleukin-5 receptor antibody for asthma that was poorly controlled and because of persistent eosinophilia and high nitric oxide but given the cycle she and her daughter have decided against Nucala especially because she is convinced it is the Bowler for multiple sclerosis that is causing this problem.  She is going to skip her next multiple sclerosis infusion in May 2019.  At this point in time she is compliant with her Symbicort.  Med review  shows coreg   OV 05/10/2017  Chief Complaint  Patient presents with  . Acute Visit    Pt has complaints of coughing with thick yellow mucus and occ. dark brown mucus along with wheezing and hoarseness. Pt denies any fever.   70 -year-old female with asthma eosinophilic and moderate persistent on Symbicort.  She presents for an acute visit.  Since seeing me 3 weeks ago she had gradual deterioration in symptoms.  She is having significant nocturnal symptoms.  Her friends are telling her that her voice is hoarse.  She says she is compliant with her Symbicort and Singulair.  Her interleukin-5 receptor antibody is on hold because she is worried about interactions with the monoclonal antibody for multiple sclerosis still in her system and the washout of that is expected only June 2019.  Currently her asthma control questionnaire shows a score of 3.6 showing significant symptoms.  She is waking up a few times at night because of asthma symptoms and when she wakes up she has moderate symptoms.  She is moderately limited in her  activities because of asthma.  She is short of breath quite a bit and wheezing all the time although she hold off on using her albuterol for rescue.  Med review shows that she is on nonspecific beta blocker carvedilol.  There is no fever or chills.    OV 06/08/2017  Chief Complaint  Patient presents with  . Follow-up    Pt has had problems with side effects from the spiriva due to eye problems and dry mouth.  Breo and spiriva had helped with the wheezing but due to side effects, meds were stopped x2 days. States she had a coughing fit with the meds as well. Pt has c/o cough with yellow mucus, SOB, and is back to wheezing.   73 year old female with asthma eosinophilic and moderate persistent.  At last visit she was in acute visit.  I added Spiriva to her regimen.  Switch to Symbicort 2 higher dose Brio.  I asked her to continue her Singulair.  She still was not interested in her biologic therapy given her issues doing biologic therapy with multiple sclerosis.  We also had her talk to her physician and get her nonspecific beta-blocker carvedilol switched out.  She did all this.  However she tells me that Spiriva is causing dry mouth and she stopped it like a week ago.  For some unclear reason she also stopped the Brio for a few days a week ago and then again for a few days earlier this week and then starting yesterday or like 2 days ago started having worsening asthma symptoms.  She is now reporting significant cough.  When she wakes up she has mild symptoms when she is active she is very slightly limited because of her asthma.  She is short of breath a moderate amount because of the asthma and is wheezing a moderate amount because of the asthma and in the last 2 days most of the time and she is now using nebulizer for rescue 2 times daily.  Average asthma control question as 2.2.  Her exact nitric oxide test continues to be elevated at 156 as always.  She is now accepting that she will have to go through  biologic therapy.  Her daughter status with her.  The questioning alternative etiologies.  She tells me that she might have mold in the house.  Review of the labs show she had normal allergy blood test in July 2018  and a clear chest x-ray in December 2018 [CT chest in November showed tree-in-bud in the right upper lobe 1 month earlier] IgE was normal back in July 2018.  Aspergillus panel never checked.      Results for Robyn, [name] (MRN [id number]) as of 04/19/2017 10:02  Ref. Range 10/07/2016 12:29 11/10/2016 15:22 12/27/2016 17:04 01/27/2017 15:29 02/08/2017 10:30 05/10/2017  06/08/2017   Nitric Oxide Unknown 180 150 187 146 115 153 156  ACQ score       3.6   2.2    Results for Robyn, [name] (MRN [id number]) as of 04/19/2017 10:02  Ref. Range 05/09/2009 12:25 05/10/2009 04:32 10/07/2016 13:11 10/07/2016 20:51 03/19/2017 15:45  Eosinophils Absolute Latest Ref Range: 0.0 - 0.7 K/uL 0.0 0.1 0.4 0.3 0.3   Results for Robyn, [name] (MRN [id number]) as of 05/10/2017 12:14  Ref. Range 10/07/2016 13:11  IgE (Immunoglobulin E), Serum Latest Ref Range: <115 kU/L 4       has a past medical history of Anemia, Anginal pain (HCC), Arthritis, Complication of anesthesia, Coronary artery disease, Dry eyes, Esophageal spasm, GERD (gastroesophageal reflux disease), and MS (multiple sclerosis) ([place]).   reports that  has never smoked. she has never used smokeless tobacco.  Past Surgical History:  Procedure Laterality Date  . ABDOMINAL HYSTERECTOMY    . BLEPHAROPLASTY Bilateral   . CARDIAC CATHETERIZATION  10/04/06   MINOR CAD,SINGLE VESSEL INVOLVING THE CIRCUMFLEX. 20 TO 30% PROXIMALLY AND 10 TO 20% IN THE MIDDLE SEGMENT.MILD MUSCLE BRIDGING, MID LAD.NORMAL RCA.NORMAL LV FUNCTION.NORMAL MITRAL AND AORTIC VALVE.NORMAL APPEARING AORTA,THORACIC AND ABDOMINAL.NORMAL RENAL ARTERIES.  [name] CARDIOLOGY NUCLEAR MED STUDY  06/22/12   NL LV FUNCTION,EF 68%,NL WALL MOTION.  [name] CAROTID DUPLEX  07/02/11   IFO:YDXA SOFT PLAQUE  NOTED DISTAL CCA AND ORGIN AND PROXIMAL ICA,LEFT>RIGHT.NO ICA STENOSIS. VERTEBRAL ARTERY FLOW IS ANTEGRADE.  [name] CESAREAN SECTION     x2   . COLONOSCOPY WITH PROPOFOL N/A 12/11/2014   Procedure: COLONOSCOPY WITH PROPOFOL;  Surgeon: Ronald Lobo, MD;  Location: [facility];  Service: Endoscopy;  Laterality: N/A;  . KNEE ARTHROSCOPY Left    scope  . PARATHYROIDECTOMY     partial-many years ago  . thumb surgery Bilateral    built up and bone removal  . TONSILLECTOMY    . TRANSTHORACIC ECHOCARDIOGRAM  07/02/11   LV CAVITY SIZE IS NORMAL. SYSTOLIC FUNCTION WAS NORMAL.EF=55% TO 60%.INCREASED RELATIVE CONTRIBUTION OF ATRIAL CONTRACTION TO VENTRICULAR FILLING;MAYBE DUE TO HYPOVOLEMIA. AV=MILD REGURG.    Allergies  Allergen Reactions  . Crestor [Rosuvastatin] Other (See Comments)    Joint pain   . Vicodin [Hydrocodone-Acetaminophen]     Made pass-out one time and is okay taking now  . Isovue [Iopamidol]     Pt had sneezing and itching of her throat and soft palate.  Dr Alvester Chou checked pt.  She will need premeds in the future.  J Bohm    Immunization History  Administered Date(s) Administered  . Influenza, High Dose Seasonal PF 12/22/2013, 12/14/2015, 12/14/2016  . Influenza,inj,Quad PF,6+ Mos 11/27/2014  . Pneumococcal Polysaccharide-23 10/26/2013  . Pneumococcal-Unspecified 10/26/2013  . Tdap 06/14/2011    Family History  Problem Relation Age of Onset  . Heart disease Mother   . Asthma Father   . Bladder Cancer Father   . Heart failure Father   . Hyperlipidemia Brother      Current Outpatient Medications:  .  albuterol (PROVENTIL HFA;VENTOLIN HFA) 108 (90 Base) MCG/ACT inhaler, Inhale 1-2 puffs into the lungs every 6 (  six) hours as needed for wheezing or shortness of breath., Disp: 1 Inhaler, Rfl: 0 .  amLODipine (NORVASC) 5 MG tablet, TAKE 1 TABLET ONCE DAILY., Disp: 30 tablet, Rfl: 11 .  aspirin EC 81 MG tablet, Take 1 tablet (81 mg total) by mouth daily., Disp: 90 tablet, Rfl:  3 .  benzonatate (TESSALON) 200 MG capsule, Take 1 capsule (200 mg total) by mouth 3 (three) times daily as needed for cough., Disp: 45 capsule, Rfl: 3 .  calcium-vitamin D (OSCAL WITH D) 500-200 MG-UNIT tablet, Take 1 tablet by mouth daily., Disp: , Rfl:  .  cetirizine (ZYRTEC ALLERGY) 10 MG tablet, Take 1 tablet (10 mg total) by mouth daily., Disp: 30 tablet, Rfl: 5 .  Cholecalciferol (VITAMIN D PO), Take 1 tablet by mouth daily., Disp: , Rfl:  .  CINNAMON PO, Take 1 tablet by mouth daily., Disp: , Rfl:  .  hyoscyamine (LEVSIN, ANASPAZ) 0.125 MG tablet, Take 0.125 mg by mouth every 4 (four) hours as needed (for spasms). , Disp: , Rfl:  .  Ipratropium-Albuterol (COMBIVENT RESPIMAT) 20-100 MCG/ACT AERS respimat, Inhale 1 puff into the lungs every 6 (six) hours as needed for wheezing., Disp: , Rfl:  .  isosorbide mononitrate (IMDUR) 60 MG 24 hr tablet, TAKE 1 TABLET ONCE DAILY., Disp: 30 tablet, Rfl: 11 .  levothyroxine (SYNTHROID, LEVOTHROID) 25 MCG tablet, Take 25 mcg daily before breakfast by mouth., Disp: , Rfl:  .  losartan (COZAAR) 50 MG tablet, , Disp: , Rfl:  .  montelukast (SINGULAIR) 10 MG tablet, Take 1 tablet (10 mg total) by mouth at bedtime., Disp: 30 tablet, Rfl: 11 .  Multiple Vitamin (MULTIVITAMIN WITH MINERALS) TABS tablet, Take 1 tablet by mouth daily., Disp: , Rfl:  .  ocrelizumab (OCREVUS) 300 MG/10ML injection, Inject 600 mg into the vein every 6 (six) months., Disp: , Rfl:  .  pantoprazole (PROTONIX) 20 MG tablet, Take 1 tablet (20 mg total) by mouth 2 (two) times daily., Disp: 60 tablet, Rfl: 3 .  PARoxetine (PAXIL) 10 MG tablet, Take 5 mg by mouth daily. , Disp: , Rfl:  .  Respiratory Therapy Supplies (FLUTTER) DEVI, Use as directed, Disp: 1 each, Rfl: 0 .  TURMERIC PO, Take 1 tablet by mouth daily., Disp: , Rfl:  .  VYTORIN 10-20 MG per tablet, Take 1 tablet by mouth daily. , Disp: , Rfl:  .  fluticasone furoate-vilanterol (BREO ELLIPTA) 200-25 MCG/INH AEPB, Inhale 1 puff  into the lungs daily. (Patient not taking: Reported on 06/08/2017), Disp: 1 each, Rfl: 5 .  nitroGLYCERIN (NITROSTAT) 0.4 MG SL tablet, Place 1 tablet (0.4 mg total) under the tongue every 5 (five) minutes as needed for chest pain. (Patient not taking: Reported on 06/08/2017), Disp: 25 tablet, Rfl: 3 .  Tiotropium Bromide Monohydrate (SPIRIVA RESPIMAT) 2.5 MCG/ACT AERS, Inhale 2 puffs into the lungs daily. (Patient not taking: Reported on 06/08/2017), Disp: 1 Inhaler, Rfl: 5   Review of Systems     Objective:   Physical Exam  Constitutional: She is oriented to person, place, and time. She appears well-developed and well-nourished. No distress.  HENT:  Head: Normocephalic and atraumatic.  Right Ear: External ear normal.  Left Ear: External ear normal.  Mouth/Throat: Oropharynx is clear and moist. No oropharyngeal exudate.  coughing a lot - much worse than before  Eyes: Conjunctivae and EOM are normal. Pupils are equal, round, and reactive to light. Right eye exhibits no discharge. Left eye exhibits no discharge. No scleral icterus.  Neck: Normal range of motion. Neck supple. No JVD present. No tracheal deviation present. No thyromegaly present.  Cardiovascular: Normal rate, regular rhythm, normal heart sounds and intact distal pulses. Exam reveals no gallop and no friction rub.  No murmur heard. Pulmonary/Chest: Effort normal and breath sounds normal. No respiratory distress. She has no wheezes. She has no rales. She exhibits no tenderness.  Abdominal: Soft. Bowel sounds are normal. She exhibits no distension and no mass. There is no tenderness. There is no rebound and no guarding.  Musculoskeletal: Normal range of motion. She exhibits no edema or tenderness.  Lymphadenopathy:    She has no cervical adenopathy.  Neurological: She is alert and oriented to person, place, and time. She has normal reflexes. No cranial nerve deficit. She exhibits normal muscle tone. Coordination normal.  Skin: Skin  is warm and dry. No rash noted. She is not diaphoretic. No erythema. No pallor.  Psychiatric: She has a normal mood and affect. Her behavior is normal. Judgment and thought content normal.  Vitals reviewed.  Vitals:   06/08/17 1044  BP: 122/74  Pulse: 97  SpO2: 94%  Weight: 156 lb 3.2 oz (70.9 kg)  Height: 5\' 3"  (1.6 m)   Estimated body mass index is 27.67 kg/m as calculated from the following:   Height as of this encounter: 5\' 3"  (1.6 m).   Weight as of this encounter: 156 lb 3.2 oz (70.9 kg).        Assessment:       ICD-10-CM   1. Moderate persistent asthma with exacerbation J45.41   2. Eosinophilic asthma ([place]) B93        Plan:       Flare up due to stopping breo  Understand spiriva was causing dry mout No other etiology identified so far - but perhaps we ccan retest for ABPA Might have to consider autoimmune at followup depending on course  Plan Retest for ABPA  - check IgE, aspergillus antibody blood test , do CXR Take prednisone 40 mg daily x 2 days, then 20mg  daily x 2 days, then 10mg  daily x 2 days, then 5mg  daily x 2 days and stop Start dulera high dose - you need to be on inhaled steroids No spiriva for you Continue singulair Glad coreg got changed out Start Nucala starting next week  Followup 4-6 weeks or sooner if needed with myself or APP  - feno and ACQ at followup    Dr. Brand Males, M.D., [facility].C.P Pulmonary and Critical Care Medicine Staff Physician, [place] Director - Interstitial Lung Disease  Program  Pulmonary [place] at [place], [place], [address]  Pager: [phone], If no answer or between  15:00h - 7:00h: call [phone] Telephone: [phone]

## 2017-06-08 NOTE — Patient Instructions (Signed)
ICD-10-CM   1. Moderate persistent asthma with exacerbation J45.41   2. Eosinophilic asthma ([place]) Y72     Flare up due to stopping breo  Understand spiriva was causing dry mout  plan Retest for ABPA  - check IgE, aspergillus antibody blood test , do CXR Take prednisone 40 mg daily x 2 days, then 20mg  daily x 2 days, then 10mg  daily x 2 days, then 5mg  daily x 2 days and stop Start dulera high dose - you need to be on inhaled steroids No spiriva for you Continue singulair Glad coreg got changed out Start Nucala starting next week  Followup 4-6 weeks or sooner if needed with myself or APP  - feno and ACQ at followup

## 2017-06-12 LAB — ASPERGILLUS ANTIBODY BY IMMUNODIFF: Aspergillus Antibody ID: NEGATIVE

## 2017-06-12 LAB — IGE: IgE (Immunoglobulin E), Serum: 2 kU/L (ref <=114)

## 2017-06-13 ENCOUNTER — Other Ambulatory Visit: Payer: Self-pay | Admitting: Internal Medicine

## 2017-06-13 ENCOUNTER — Ambulatory Visit (INDEPENDENT_AMBULATORY_CARE_PROVIDER_SITE_OTHER): Payer: Medicare Other

## 2017-06-13 DIAGNOSIS — J4541 Moderate persistent asthma with (acute) exacerbation: Secondary | ICD-10-CM

## 2017-06-13 MED ORDER — ALBUTEROL SULFATE HFA 108 (90 BASE) MCG/ACT IN AERS: 1.0000 | INHALATION_SPRAY | Freq: Four times a day (QID) | RESPIRATORY_TRACT | 5 refills | Status: DC | PRN

## 2017-06-14 ENCOUNTER — Telehealth: Payer: Self-pay | Admitting: Internal Medicine

## 2017-06-14 ENCOUNTER — Ambulatory Visit (INDEPENDENT_AMBULATORY_CARE_PROVIDER_SITE_OTHER): Payer: Medicare Other

## 2017-06-14 ENCOUNTER — Other Ambulatory Visit: Payer: Self-pay | Admitting: Internal Medicine

## 2017-06-14 DIAGNOSIS — R002 Palpitations: Secondary | ICD-10-CM

## 2017-06-14 DIAGNOSIS — J4541 Moderate persistent asthma with (acute) exacerbation: Secondary | ICD-10-CM | POA: Diagnosis not present

## 2017-06-14 DIAGNOSIS — R0789 Other chest pain: Secondary | ICD-10-CM

## 2017-06-14 MED ORDER — FLUCONAZOLE 150 MG PO TABS: 150.0000 mg | ORAL_TABLET | Freq: Every day | ORAL | 0 refills | Status: DC

## 2017-06-14 MED ORDER — MEPOLIZUMAB 100 MG ~~LOC~~ SOLR
100.0000 mg | SUBCUTANEOUS | Status: DC
  Administered 2017-06-14: 100 mg via SUBCUTANEOUS

## 2017-06-14 NOTE — Telephone Encounter (Signed)
Spoke with pt  She was seen 06/08/17 by MR and given pred taper  She states every time she takes pred she gets thrush She has white coating on tongue and her mouth is sore  She is on [facility] and reports she is diligent about rinsing her mouth after use  Please advise, thanks! Allergies  Allergen Reactions  . Crestor [Rosuvastatin] Other (See Comments)    Joint pain   . Vicodin [Hydrocodone-Acetaminophen]     Made pass-out one time and is okay taking now  . Isovue [Iopamidol]     Pt had sneezing and itching of her throat and soft palate.  Dr Alvester Chou checked pt.  She will need premeds in the future.  Alfonse Alpers   Current Outpatient Medications on File Prior to Visit  Medication Sig Dispense Refill  . albuterol (PROVENTIL HFA;VENTOLIN HFA) 108 (90 Base) MCG/ACT inhaler Inhale 1-2 puffs into the lungs every 6 (six) hours as needed for wheezing or shortness of breath. 1 Inhaler 5  . amLODipine (NORVASC) 5 MG tablet TAKE 1 TABLET ONCE DAILY. 30 tablet 11  . aspirin EC 81 MG tablet Take 1 tablet (81 mg total) by mouth daily. 90 tablet 3  . benzonatate (TESSALON) 200 MG capsule Take 1 capsule (200 mg total) by mouth 3 (three) times daily as needed for cough. 45 capsule 3  . calcium-vitamin D (OSCAL WITH D) 500-200 MG-UNIT tablet Take 1 tablet by mouth daily.    . cetirizine (ZYRTEC ALLERGY) 10 MG tablet Take 1 tablet (10 mg total) by mouth daily. 30 tablet 5  . Cholecalciferol (VITAMIN D PO) Take 1 tablet by mouth daily.    [name] CINNAMON PO Take 1 tablet by mouth daily.    . hyoscyamine (LEVSIN, ANASPAZ) 0.125 MG tablet Take 0.125 mg by mouth every 4 (four) hours as needed (for spasms).     . Ipratropium-Albuterol (COMBIVENT RESPIMAT) 20-100 MCG/ACT AERS respimat Inhale 1 puff into the lungs every 6 (six) hours as needed for wheezing.    . isosorbide mononitrate (IMDUR) 60 MG 24 hr tablet TAKE 1 TABLET ONCE DAILY. 30 tablet 11  . levothyroxine (SYNTHROID, LEVOTHROID) 25 MCG tablet Take 25 mcg daily before  breakfast by mouth.    . losartan (COZAAR) 50 MG tablet     . mometasone-formoterol (DULERA) 200-5 MCG/ACT AERO Inhale 2 puffs into the lungs 2 (two) times daily. 1 Inhaler 5  . montelukast (SINGULAIR) 10 MG tablet Take 1 tablet (10 mg total) by mouth at bedtime. 30 tablet 11  . Multiple Vitamin (MULTIVITAMIN WITH MINERALS) TABS tablet Take 1 tablet by mouth daily.    . nitroGLYCERIN (NITROSTAT) 0.4 MG SL tablet Place 1 tablet (0.4 mg total) under the tongue every 5 (five) minutes as needed for chest pain. (Patient not taking: Reported on 06/08/2017) 25 tablet 3  . ocrelizumab (OCREVUS) 300 MG/10ML injection Inject 600 mg into the vein every 6 (six) months.    . pantoprazole (PROTONIX) 20 MG tablet Take 1 tablet (20 mg total) by mouth 2 (two) times daily. 60 tablet 3  . PARoxetine (PAXIL) 10 MG tablet Take 5 mg by mouth daily.     . predniSONE (DELTASONE) 10 MG tablet Take 40mg x2days,20mg x2days,10mg x2days,5mg x2days,then stop 15 tablet 0  . Respiratory Therapy Supplies (FLUTTER) DEVI Use as directed 1 each 0  . TURMERIC PO Take 1 tablet by mouth daily.    [name] VYTORIN 10-20 MG per tablet Take 1 tablet by mouth daily.      Current Facility-Administered Medications  on File Prior to Visit  Medication Dose Route Frequency Provider Last Rate Last Dose  . Mepolizumab SOLR 100 mg  100 mg Subcutaneous Q28 days Brand Males, MD   100 mg at 06/14/17 [phone]

## 2017-06-14 NOTE — Telephone Encounter (Signed)
Spoke with the pt and notified of recs per CDY Rx was sent to pharm   

## 2017-06-14 NOTE — Telephone Encounter (Signed)
Offer Diflucan 150 mg, # 6, 1 daily

## 2017-06-23 ENCOUNTER — Other Ambulatory Visit: Payer: Self-pay | Admitting: Pulmonary Disease

## 2017-06-23 ENCOUNTER — Telehealth: Payer: Self-pay

## 2017-06-23 ENCOUNTER — Encounter: Payer: Self-pay | Admitting: Physician Assistant

## 2017-06-23 ENCOUNTER — Ambulatory Visit: Payer: Medicare Other | Admitting: Physician Assistant

## 2017-06-23 VITALS — BP 119/77 | HR 76 | Ht 62.0 in | Wt 154.0 lb

## 2017-06-23 DIAGNOSIS — G35 Multiple sclerosis: Secondary | ICD-10-CM

## 2017-06-23 DIAGNOSIS — I10 Essential (primary) hypertension: Secondary | ICD-10-CM | POA: Diagnosis not present

## 2017-06-23 DIAGNOSIS — J4540 Moderate persistent asthma, uncomplicated: Secondary | ICD-10-CM

## 2017-06-23 DIAGNOSIS — E785 Hyperlipidemia, unspecified: Secondary | ICD-10-CM

## 2017-06-23 DIAGNOSIS — R002 Palpitations: Secondary | ICD-10-CM

## 2017-06-23 MED ORDER — DILTIAZEM HCL ER COATED BEADS 120 MG PO CP24: 120.0000 mg | ORAL_CAPSULE | Freq: Every day | ORAL | 6 refills | Status: DC

## 2017-06-23 NOTE — Patient Instructions (Addendum)
Medication Instructions:  DISCONTINUE Coreg DISCONTINUE Norvasc START Cardizem CD 120mg  Take 1 tablet daily at bedtime   Labwork: None   Testing/Procedures: None   Follow-Up: Your physician wants you to follow-up in: 6 months with Dr Claiborne Billings. You will receive a reminder letter in the mail two months in advance. If you don't receive a letter, please call our office to schedule the follow-up appointment.  Any Other Special Instructions Will Be Listed Below (If Applicable).  If you need a refill on your cardiac medications before your next appointment, please call your pharmacy.

## 2017-06-23 NOTE — Telephone Encounter (Signed)
Called patient to schedule 6 month visit, unaware that Dr Evette Georges schedule was open in September. Appointment made. Patient was concern that Cardizem could raise her vytorin level. Per Isaac Laud he recommends the patient to take the med and monitor for muscle aches. Patient voiced understanding.

## 2017-06-23 NOTE — Progress Notes (Addendum)
Cardiology Office Note    Date:  06/24/2017   ID:  Abagael, Kramm 1945-08-27, MRN [id number]  PCP:  [name], MD  Cardiologist:  Dr. Claiborne Billings  Primary pulmonologist: [name]    Chief Complaint  Patient presents with  . Follow-up    seen for Dr. Claiborne Billings, recent palpitation. s/p holter monitor    History of Present Illness:  [name] is a 72 y.o. female with PMH of GERD, HTN, HLD, moderate persistent asthma, multiple sclerosis, esophageal spasm and CAD.  She was noted to have mild CAD on cardiac catheterization in July 2008 by Dr. Glade Lloyd.  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  She was placed on medical therapy.  Myoview in March 2014 showed nonspecific T wave changes, nondiagnostic 0.5-1 mm inferior lateral ST segment changes with stress.  Perfusion image was normal.  Last Myoview in December 2016 revealed normal perfusion with the 66%.  She was last seen by Dr. Claiborne Billings on 01/11/2016, she was doing well at the time.  A one-year follow-up was recommended.  I last saw the patient on 02/28/2017, at which time she was having some occasional chest pain more consistent with esophageal spasm.  She also had atypical pain under the left and right breast every time she turned her body.  This is consistent with musculoskeletal pain versus pleurisy.  She did have a heart murmur at the time, I repeated echocardiogram on 03/08/2017 which showed EF 60-65%, grade 1 DD, mild aortic stenosis with trivial AI.   Her asthma has been worsening for the past year.  She presents today at the recommendation of Dr. Darliss Ridgel for evaluation of palpitation and chest pain.  This is still the same chest pain I saw her for near the end of last year, she says she does not have chest pain unless she over stretched her body.  This is clearly musculoskeletal in nature and I do not recommend any further workup.  She did have a  24-hour monitor to check her palpitation.  This did reveal some short bursts of SVT, PAC and PVCs.  She says she noticed the symptoms more so when she is laying on her bed.  I did ask her to cut back on caffeine.  Since we cannot use carvedilol, I would recommend long-acting diltiazem 120 mg daily to manage her symptom.  Otherwise she does not appears to have any heart failure symptom, she has no lower extremity edema, orthopnea or PND.   Past Medical History:  Diagnosis Date  . Anemia    past history-many yrs ago  . Anginal pain ([place])    being evaluated by Dr. Tyrone Sage, arm pain,"bad indigestion" -no heart related findings as of yet  . Arthritis    hip. back pain  . Complication of anesthesia    s/p Hysterectomy "vagal response "heart stopped" -did not require shocking.  . Coronary artery disease   . Dry eyes   . Esophageal spasm   . GERD (gastroesophageal reflux disease)   . MS (multiple sclerosis) ([place])    stable-sees [name] every 6 months    Past Surgical History:  Procedure Laterality Date  . ABDOMINAL HYSTERECTOMY    . BLEPHAROPLASTY Bilateral   . CARDIAC CATHETERIZATION  10/04/06   MINOR CAD,SINGLE VESSEL INVOLVING THE CIRCUMFLEX. 20 TO 30% PROXIMALLY AND 10 TO 20% IN THE MIDDLE SEGMENT.MILD MUSCLE BRIDGING, MID LAD.NORMAL RCA.NORMAL LV FUNCTION.NORMAL MITRAL  AND AORTIC VALVE.NORMAL APPEARING AORTA,THORACIC AND ABDOMINAL.NORMAL RENAL ARTERIES.  [name] CARDIOLOGY NUCLEAR MED STUDY  06/22/12   NL LV FUNCTION,EF 68%,NL WALL MOTION.  [name] CAROTID DUPLEX  07/02/11   RCV:ELFY SOFT PLAQUE NOTED DISTAL CCA AND ORGIN AND PROXIMAL ICA,LEFT>RIGHT.NO ICA STENOSIS. VERTEBRAL ARTERY FLOW IS ANTEGRADE.  [name] CESAREAN SECTION     x2   . COLONOSCOPY WITH PROPOFOL N/A 12/11/2014   Procedure: COLONOSCOPY WITH PROPOFOL;  Surgeon: Ronald Lobo, MD;  Location: [facility];  Service: Endoscopy;  Laterality: N/A;  . KNEE ARTHROSCOPY Left    scope  . PARATHYROIDECTOMY     partial-many years ago  . thumb  surgery Bilateral    built up and bone removal  . TONSILLECTOMY    . TRANSTHORACIC ECHOCARDIOGRAM  07/02/11   LV CAVITY SIZE IS NORMAL. SYSTOLIC FUNCTION WAS NORMAL.EF=55% TO 60%.INCREASED RELATIVE CONTRIBUTION OF ATRIAL CONTRACTION TO VENTRICULAR FILLING;MAYBE DUE TO HYPOVOLEMIA. AV=MILD REGURG.    Current Medications: Outpatient Medications Prior to Visit  Medication Sig Dispense Refill  . albuterol (PROVENTIL HFA;VENTOLIN HFA) 108 (90 Base) MCG/ACT inhaler Inhale 1-2 puffs into the lungs every 6 (six) hours as needed for wheezing or shortness of breath. 1 Inhaler 5  . aspirin EC 81 MG tablet Take 1 tablet (81 mg total) by mouth daily. 90 tablet 3  . calcium-vitamin D (OSCAL WITH D) 500-200 MG-UNIT tablet Take 1 tablet by mouth daily.    . carbamazepine (TEGRETOL) 200 MG tablet Take 1 tablet by mouth 2 (two) times daily.    . cetirizine (ZYRTEC ALLERGY) 10 MG tablet Take 1 tablet (10 mg total) by mouth daily. 30 tablet 5  . Cholecalciferol (VITAMIN D PO) Take 1 tablet by mouth daily.    [name] CINNAMON PO Take 1 tablet by mouth daily.    . diazepam (VALIUM) 2 MG tablet     . fluconazole (DIFLUCAN) 150 MG tablet Take 1 tablet (150 mg total) by mouth daily. 6 tablet 0  . hyoscyamine (LEVSIN, ANASPAZ) 0.125 MG tablet Take 0.125 mg by mouth every 4 (four) hours as needed (for spasms).     . isosorbide mononitrate (IMDUR) 60 MG 24 hr tablet TAKE 1 TABLET ONCE DAILY. 30 tablet 11  . levothyroxine (SYNTHROID, LEVOTHROID) 25 MCG tablet Take 25 mcg daily before breakfast by mouth.    . losartan (COZAAR) 50 MG tablet     . mometasone-formoterol (DULERA) 200-5 MCG/ACT AERO Inhale 2 puffs into the lungs 2 (two) times daily. 1 Inhaler 5  . montelukast (SINGULAIR) 10 MG tablet Take 1 tablet (10 mg total) by mouth at bedtime. 30 tablet 11  . Multiple Vitamin (MULTIVITAMIN WITH MINERALS) TABS tablet Take 1 tablet by mouth daily.    . nitroGLYCERIN (NITROSTAT) 0.4 MG SL tablet Place 1 tablet (0.4 mg total) under  the tongue every 5 (five) minutes as needed for chest pain. 25 tablet 3  . ocrelizumab (OCREVUS) 300 MG/10ML injection Inject 600 mg into the vein every 6 (six) months.    [name] PARoxetine (PAXIL) 10 MG tablet Take 5 mg by mouth daily.     [name] Respiratory Therapy Supplies (FLUTTER) DEVI Use as directed 1 each 0  . TURMERIC PO Take 1 tablet by mouth daily.    [name] VYTORIN 10-20 MG per tablet Take 1 tablet by mouth daily.     [name] amLODipine (NORVASC) 5 MG tablet TAKE 1 TABLET ONCE DAILY. 30 tablet 11  . pantoprazole (PROTONIX) 20 MG tablet Take 1 tablet (20 mg total) by mouth 2 (  two) times daily. 60 tablet 3  . benzonatate (TESSALON) 200 MG capsule Take 1 capsule (200 mg total) by mouth 3 (three) times daily as needed for cough. (Patient not taking: Reported on 06/23/2017) 45 capsule 3  . Ipratropium-Albuterol (COMBIVENT RESPIMAT) 20-100 MCG/ACT AERS respimat Inhale 1 puff into the lungs every 6 (six) hours as needed for wheezing.    . predniSONE (DELTASONE) 10 MG tablet Take 40mg x2days,20mg x2days,10mg x2days,5mg x2days,then stop (Patient not taking: Reported on 06/23/2017) 15 tablet 0   Facility-Administered Medications Prior to Visit  Medication Dose Route Frequency Provider Last Rate Last Dose  . Mepolizumab SOLR 100 mg  100 mg Subcutaneous Q28 days Robyn Males, MD   100 mg at 06/14/17 8250     Allergies:   Crestor [rosuvastatin]; Vicodin [hydrocodone-acetaminophen]; and Isovue [iopamidol]   Social History   Socioeconomic History  . Marital status: Divorced    Spouse name: Not on file  . Number of children: Not on file  . Years of education: Not on file  . Highest education level: Not on file  Occupational History  . Occupation: retired  Scientific laboratory technician  . Financial resource strain: Not on file  . Food insecurity:    Worry: Not on file    Inability: Not on file  . Transportation needs:    Medical: Not on file    Non-medical: Not on file  Tobacco Use  . Smoking status: Never Smoker  .  Smokeless tobacco: Never Used  Substance and Sexual Activity  . Alcohol use: Yes    Alcohol/week: 0.0 oz    Comment: wine occ.  . Drug use: No  . Sexual activity: Not on file  Lifestyle  . Physical activity:    Days per week: Not on file    Minutes per session: Not on file  . Stress: Not on file  Relationships  . Social connections:    Talks on phone: Not on file    Gets together: Not on file    Attends religious service: Not on file    Active member of club or organization: Not on file    Attends meetings of clubs or organizations: Not on file    Relationship status: Not on file  Other Topics Concern  . Not on file  Social History Narrative  . Not on file     Family History:  The patient's family history includes Asthma in her father; Bladder Cancer in her father; Heart disease in her mother; Heart failure in her father; Hyperlipidemia in her brother.   ROS:   Please see the history of present illness.    ROS All other systems reviewed and are negative.   PHYSICAL EXAM:   VS:  BP 119/77   Pulse 76   Ht 5\' 2"  (1.575 m)   Wt 154 lb (69.9 kg)   BMI 28.17 kg/m    GEN: Well nourished, well developed, in no acute distress  HEENT: normal  Neck: no JVD, carotid bruits, or masses Cardiac: RRR; no murmurs, rubs, or gallops,no edema  Respiratory:  clear to auscultation bilaterally, normal work of breathing GI: soft, nontender, nondistended, + BS MS: no deformity or atrophy  Skin: warm and dry, no rash Neuro:  Alert and Oriented x 3, Strength and sensation are intact Psych: euthymic mood, full affect  Wt Readings from Last 3 Encounters:  06/23/17 154 lb (69.9 kg)  06/08/17 156 lb 3.2 oz (70.9 kg)  05/10/17 156 lb 12.8 oz (71.1 kg)      Studies/Labs Reviewed:  EKG:  EKG is ordered today.  The ekg ordered today demonstrates normal sinus rhythm without significant ST-T wave changes  Recent Labs: 03/19/2017: ALT 24; BUN 10; Creatinine, Ser 0.72; Hemoglobin 13.0;  Platelets 193; Potassium 3.8; Sodium 140   Lipid Panel No results found for: CHOL, TRIG, HDL, CHOLHDL, VLDL, LDLCALC, LDLDIRECT  Additional studies/ records that were reviewed today include:   Echo 03/08/2017 LV EF: 60% -   65%  Study Conclusions  - Procedure narrative: Transthoracic echocardiography. Image   quality was adequate. The study was technically difficult. - Left ventricle: The cavity size was normal. Wall thickness was   increased in a pattern of moderate LVH. Systolic function was   normal. The estimated ejection fraction was in the range of 60%   to 65%. Wall motion was normal; there were no regional wall   motion abnormalities. Doppler parameters are consistent with   abnormal left ventricular relaxation (grade 1 diastolic   dysfunction). The E/e&' ratio is between 8-15, suggesting   indeterminate LV filling pressure. - Aortic valve: Mildly calcified leaflets. Mild stenosis. There was   trivial regurgitation. Mean gradient (S): 10 mm Hg. Peak gradient   (S): 20 mm Hg. Valve area (VTI): 1.34 cm^2. Valve area (Vmax):   1.23 cm^2. - Left atrium: The atrium was normal in size. - Inferior vena cava: The vessel was normal in size. The   respirophasic diameter changes were in the normal range (>= 50%),   consistent with normal central venous pressure.  Impressions:  - Technically difficult study. LVEF 60-65%, moderate LVH, normal   wall motion, grade 1 DD, indeterminate LV filling pressure, mild   aortic stenosis with trivial AI, normal LA size, normal IVC.   ASSESSMENT:    1. Palpitation   2. Essential hypertension   3. Hyperlipidemia, unspecified hyperlipidemia type   4. Moderate persistent asthma without complication   5. Multiple sclerosis ([place])      PLAN:  In order of problems listed above:  1. Palpitation: Her carvedilol was recently discontinued due to recurrent asthma, she is having palpitation mainly when she lays down at night.  Heart monitor  showed PACs and PVCs with occasional short bursts of SVT.  We will add a low-dose Cardizem.  Discontinue Norvasc.  2. Moderate persistent asthma: Followed by pulmonology service, carvedilol was recently discontinued due to concern for bronchospasm  3. Hypertension: Blood pressure stable.  I will add Cardizem for rate control purposes, discontinue Norvasc.  4. Hyperlipidemia: On Vytorin.  Although diltiazem can potentially increase the level of Vytorin, I have advised her to monitor for symptom of the myalgia for now.    Medication Adjustments/Labs and Tests Ordered: Current medicines are reviewed at length with the patient today.  Concerns regarding medicines are outlined above.  Medication changes, Labs and Tests ordered today are listed in the Patient Instructions below. Patient Instructions  Medication Instructions:  DISCONTINUE Coreg DISCONTINUE Norvasc START Cardizem CD 120mg  Take 1 tablet daily at bedtime   Labwork: None   Testing/Procedures: None   Follow-Up: Your physician wants you to follow-up in: 6 months with Dr Claiborne Billings. You will receive a reminder letter in the mail two months in advance. If you don't receive a letter, please call our office to schedule the follow-up appointment.  Any Other Special Instructions Will Be Listed Below (If Applicable).  If you need a refill on your cardiac medications before your next appointment, please call your pharmacy.     Signed, [name], PA  06/24/2017 12:05 PM    [address], [place], [place]  [address] Phone: [phone]; Fax: [phone]

## 2017-06-24 ENCOUNTER — Encounter: Payer: Self-pay | Admitting: Physician Assistant

## 2017-06-26 NOTE — Addendum Note (Signed)
Addended by: Ulice Brilliant T on: 06/26/2017 04:06 PM   Modules accepted: Orders

## 2017-06-30 ENCOUNTER — Telehealth: Payer: Self-pay | Admitting: Internal Medicine

## 2017-07-03 ENCOUNTER — Ambulatory Visit: Payer: Medicare Other | Admitting: Acute Care

## 2017-07-03 ENCOUNTER — Telehealth: Payer: Self-pay | Admitting: Internal Medicine

## 2017-07-03 NOTE — Telephone Encounter (Signed)
I called [facility] back, pt's nucala is coming in Wed. 07/05/2017. Nothing further needed.

## 2017-07-03 NOTE — Telephone Encounter (Signed)
1 Vial Order Date: 06/02/2017 Shipping Date: 07/04/2017

## 2017-07-05 NOTE — Telephone Encounter (Signed)
Arrival Date: 07/05/17  # of Vials: 1 Lot #: [id number] Expiration Date: 05/27/2019

## 2017-07-06 ENCOUNTER — Encounter: Payer: Self-pay | Admitting: Acute Care

## 2017-07-06 ENCOUNTER — Ambulatory Visit (INDEPENDENT_AMBULATORY_CARE_PROVIDER_SITE_OTHER): Payer: Medicare Other | Admitting: Acute Care

## 2017-07-06 VITALS — BP 118/78 | HR 85 | Ht 62.0 in | Wt 156.2 lb

## 2017-07-06 DIAGNOSIS — J4541 Moderate persistent asthma with (acute) exacerbation: Secondary | ICD-10-CM | POA: Diagnosis not present

## 2017-07-06 LAB — POCT EXHALED NITRIC OXIDE: FeNO level (ppb): 144

## 2017-07-06 NOTE — Progress Notes (Signed)
History of Present Illness [name] is a 72 y.o. female with moderate eosinophilic and allergic asthma. She is followed by [name].  HPI: Patient  and her daughter are very categorical that the  asthma started after she started taking monoclonal antibody infusions for her multiple sclerosis.   there is an infusion administered over 6 hours every 6 months.  She first started this infusion around November 2017.  She tells me that she has infusion reactions that start within a day or so of receiving the infusion.  These infusion reactions are characterized by body ache and fever and aggravation of past medical issues such as the meniscus.  She also has throat irritation and bronchospasm.  Then some 2 weeks later like clockwork she gets acute bronchitis which is then treated as an asthma exacerbation.  She also has significant fatigue.  She says it takes her months to recover from this and she has a normal good 5 months before the cycle is repeated again.  Most recently the infusion was in November 2018 and then she ended up in the ER for asthma exacerbation in December 2018.  She was supposed to start interleukin-5 receptor antibody for asthma that was poorly controlled and because of persistent eosinophilia and high nitric oxide but given the cycle she and her daughter have decided against Nucala especially because she is convinced it is the [place] for multiple sclerosis that is causing this problem.  She is going to skip her next multiple sclerosis infusion in May 2019.  At this point in time she is compliant with her Symbicort.  07/06/2017  Pt. Was seen for an acute flare 06/08/2017 after stopping her Breo. At that visit, Dr. Golden Pop plan was as follows: Plan Retest for ABPA             - check IgE, aspergillus antibody blood test , do CXR Take prednisone 40 mg daily x 2 days, then 20mg  daily x 2 days, then 10mg  daily x 2 days, then 5mg  daily x 2 days and stop Start dulera high dose -  you need to be on inhaled steroids No spiriva for you Continue singulair Glad coreg got changed out Start Nucala starting next week FENO at this visit was 156 ppb  Followup 4-6 weeks or sooner if needed with myself or APP             - feno and ACQ at follow up  Pt. Presents today . She states she completed he prednisone taper . She has been compliant with her Dulera, Singulair and Zyrtec. She has not had to use her rescue inhaler. She is not coughing. She states maybe once daily. Her cough is productive for yellow secretions on the occasion she does cough. She has had 1 Nucala injection.She did have some fatigue 1 week after the injection. It lasted x 1 day. She did have some mild arm tenderness one week after the injection. She does feel she is better than she was last month.She denies fever, chest pain, orthopnea or hemoptysis.     Test Results: 07/06/2017>>ACQ= 6 07/06/2017>> FENO 144 ppb 06/08/2017 IgE>> 2 Aspergillis Antibody>> Negative LOV:[id number]: Mild atelectasis of bilateral lung bases. No focal pneumonia identified.   CBC Latest Ref Rng & Units 03/19/2017 10/07/2016 10/07/2016  WBC 4.0 - 10.5 K/uL 5.2 6.0 6.7  Hemoglobin 12.0 - 15.0 g/dL 13.0 13.7 14.6  Hematocrit 36.0 - 46.0 % 39.0 39.5 42.7  Platelets 150 - 400 K/uL 193 230 264.0  BMP Latest Ref Rng & Units 03/19/2017 10/07/2016 04/26/2015  Glucose 65 - 99 mg/dL 101(H) 128(H) 148(H)  BUN 6 - 20 mg/dL 10 16 16   Creatinine 0.44 - 1.00 mg/dL 0.72 0.77 0.73  Sodium 135 - 145 mmol/L 140 141 141  Potassium 3.5 - 5.1 mmol/L 3.8 4.0 3.8  Chloride 101 - 111 mmol/L 107 108 106  CO2 22 - 32 mmol/L 23 23 27   Calcium 8.9 - 10.3 mg/dL 8.9 9.2 8.9    BNP No results found for: BNP  ProBNP No results found for: PROBNP  PFT    Component Value Date/Time   FEV1PRE 2.08 11/29/2016 1007   FEV1POST 2.00 11/29/2016 1007   FVCPRE 2.62 11/29/2016 1007   FVCPOST 2.50 11/29/2016 1007   TLC 5.05 11/29/2016 1007   DLCOUNC  20.03 11/29/2016 1007   PREFEV1FVCRT 79 11/29/2016 1007   PSTFEV1FVCRT 80 11/29/2016 1007    Dg Chest 2 View  Result Date: 06/08/2017 CLINICAL DATA:  Cough wheezing and shortness of breath for 2 days EXAM: CHEST - 2 VIEW COMPARISON:  March 21, 2017 FINDINGS: The heart size and mediastinal contours are within normal limits. There is mild atelectasis of bilateral lung bases. There is no focal infiltrate, pulmonary edema, or pleural effusion. The visualized skeletal structures are stable. IMPRESSION: Mild atelectasis of bilateral lung bases. No focal pneumonia identified. Electronically Signed   By: Abelardo Diesel M.D.   On: 06/08/2017 14:48     Past medical hx Past Medical History:  Diagnosis Date  . Anemia    past history-many yrs ago  . Anginal pain ([place])    being evaluated by Dr. Tyrone Sage, arm pain,"bad indigestion" -no heart related findings as of yet  . Arthritis    hip. back pain  . Complication of anesthesia    s/p Hysterectomy "vagal response "heart stopped" -did not require shocking.  . Coronary artery disease   . Dry eyes   . Esophageal spasm   . GERD (gastroesophageal reflux disease)   . MS (multiple sclerosis) ([place])    stable-sees [name] every 6 months     Social History   Tobacco Use  . Smoking status: Never Smoker  . Smokeless tobacco: Never Used  Substance Use Topics  . Alcohol use: Yes    Alcohol/week: 0.0 oz    Comment: wine occ.  . Drug use: No    [name] reports that she has never smoked. She has never used smokeless tobacco. She reports that she drinks alcohol. She reports that she does not use drugs.  Tobacco Cessation: Never smoker  Past surgical hx, Family hx, Social hx all reviewed.  Current Outpatient Medications on File Prior to Visit  Medication Sig  . albuterol (PROVENTIL HFA;VENTOLIN HFA) 108 (90 Base) MCG/ACT inhaler Inhale 1-2 puffs into the lungs every 6 (six) hours as needed for wheezing or shortness of breath.  [name] aspirin EC 81  MG tablet Take 1 tablet (81 mg total) by mouth daily.  . calcium-vitamin D (OSCAL WITH D) 500-200 MG-UNIT tablet Take 1 tablet by mouth daily.  . carbamazepine (TEGRETOL) 200 MG tablet Take 1 tablet by mouth 2 (two) times daily.  . cetirizine (ZYRTEC ALLERGY) 10 MG tablet Take 1 tablet (10 mg total) by mouth daily.  . Cholecalciferol (VITAMIN D PO) Take 1 tablet by mouth daily.  [name] CINNAMON PO Take 1 tablet by mouth daily.  . diazepam (VALIUM) 2 MG tablet   . fluconazole (DIFLUCAN) 150 MG tablet Take 1 tablet (150 mg total)  by mouth daily.  . hyoscyamine (LEVSIN, ANASPAZ) 0.125 MG tablet Take 0.125 mg by mouth every 4 (four) hours as needed (for spasms).   . isosorbide mononitrate (IMDUR) 60 MG 24 hr tablet TAKE 1 TABLET ONCE DAILY.  [name] levothyroxine (SYNTHROID, LEVOTHROID) 25 MCG tablet Take 25 mcg daily before breakfast by mouth.  . losartan (COZAAR) 50 MG tablet   . mometasone-formoterol (DULERA) 200-5 MCG/ACT AERO Inhale 2 puffs into the lungs 2 (two) times daily.  . montelukast (SINGULAIR) 10 MG tablet Take 1 tablet (10 mg total) by mouth at bedtime.  . Multiple Vitamin (MULTIVITAMIN WITH MINERALS) TABS tablet Take 1 tablet by mouth daily.  . nitroGLYCERIN (NITROSTAT) 0.4 MG SL tablet Place 1 tablet (0.4 mg total) under the tongue every 5 (five) minutes as needed for chest pain.  . pantoprazole (PROTONIX) 20 MG tablet TAKE 1 TABLET BY MOUTH TWICE DAILY.  [name] PARoxetine (PAXIL) 10 MG tablet Take 5 mg by mouth daily.   [name] Respiratory Therapy Supplies (FLUTTER) DEVI Use as directed  . TURMERIC PO Take 1 tablet by mouth daily.  [name] VYTORIN 10-20 MG per tablet Take 1 tablet by mouth daily.   [name] diltiazem (CARDIZEM CD) 120 MG 24 hr capsule Take 1 capsule (120 mg total) by mouth daily. (Patient not taking: Reported on 07/06/2017)  . ocrelizumab (OCREVUS) 300 MG/10ML injection Inject 600 mg into the vein every 6 (six) months.   Current Facility-Administered Medications on File Prior to Visit  Medication   . Mepolizumab SOLR 100 mg     Allergies  Allergen Reactions  . Crestor [Rosuvastatin] Other (See Comments)    Joint pain   . Vicodin [Hydrocodone-Acetaminophen]     Made pass-out one time and is okay taking now  . Isovue [Iopamidol]     Pt had sneezing and itching of her throat and soft palate.  Dr Alvester Chou checked pt.  She will need premeds in the future.  J Bohm    Review Of Systems:  Constitutional:   No  weight loss, night sweats,  Fevers, chills, fatigue, or  lassitude.  HEENT:   No headaches,  Difficulty swallowing,  Tooth/dental problems, or  Sore throat,                No sneezing, itching, ear ache, + nasal congestion, post nasal drip,   CV:  No chest pain,  Orthopnea, PND, swelling in lower extremities, anasarca, dizziness, palpitations, syncope.   GI  No heartburn, indigestion, abdominal pain, nausea, vomiting, diarrhea, change in bowel habits, loss of appetite, bloody stools.   Resp: Occasional shortness of breath with exertion less at rest.  No excess mucus, no productive cough,  No non-productive cough,  No coughing up of blood.  No change in color of mucus.  Rare wheezing.  No chest wall deformity  Skin: no rash or lesions.  GU: no dysuria, change in color of urine, no urgency or frequency.  No flank pain, no hematuria   MS:  No joint pain or swelling.  No decreased range of motion.  No back pain.  Psych:  No change in mood or affect. No depression or anxiety.  No memory loss.   Vital Signs BP 118/78 (BP Location: Left Arm, Cuff Size: Normal)   Pulse 85   Ht 5\' 2"  (1.575 m)   Wt 156 lb 3.2 oz (70.9 kg)   SpO2 96%   BMI 28.57 kg/m    Physical Exam:  General- No distress,  A&Ox3, pleasant ENT: No  sinus tenderness, TM clear, pale nasal mucosa, no oral exudate,no post nasal drip, no LAN Cardiac: S1, S2, regular rate and rhythm, no murmur Chest: No wheeze/ rales/ dullness; no accessory muscle use, no nasal flaring, no sternal retractions Abd.: Soft  Non-tender, ND, BS +, Reflux Ext: No clubbing cyanosis, edema Neuro:  normal strength Skin: No rashes, warm and dry Psych: normal mood and behavior   Assessment/Plan  Asthma exacerbation FENO still high, but trending down. First Nucala injection given>> tolerated well Compliant with Dulera Plan: We will continue Dulera 2 puffs twice daily Rinse mouth after use Continue Singulair and Zyrtec Sputum Culture ( AFB/Fungal/Sputum) We will call you with results. Continue Nucala injections Keep note of whether or not you have fatigue after the next injection. Sips of water instead of throat clearing. Sugar free hard candies for throat soothing Continue Protonix. Reschedule appointment in [facility] for assessment of reflux. Follow up in 1 month with FENO with [name] or MR Please contact office for sooner follow up if symptoms do not improve or worsen or seek emergency care      Magdalen Spatz, NP 07/06/2017  10:15 AM

## 2017-07-06 NOTE — Assessment & Plan Note (Addendum)
FENO still high, but trending down. First Nucala injection given>> tolerated well Compliant with Dulera Plan: We will continue Dulera 2 puffs twice daily Rinse mouth after use Continue Singulair and Zyrtec Sputum Culture ( AFB/Fungal/Sputum) We will call you with results. Continue Nucala injections Keep note of whether or not you have fatigue after the next injection. Sips of water instead of throat clearing. Sugar free hard candies for throat soothing Continue Protonix. Reschedule appointment in [facility] for assessment of reflux. Follow up in 1 month with FENO with [name] or MR Please contact office for sooner follow up if symptoms do not improve or worsen or seek emergency care

## 2017-07-06 NOTE — Patient Instructions (Addendum)
It is good to see you today. We will continue Dulera 2 puffs twice daily Rinse mouth after use Continue Singulair and Zyrtec Sputum Culture ( Send for same as previous order0 We will call you with results. Continue Nucala injections Keep note of whether or not you have fatigue after the next injection. Sips of water instead of throat clearing. Sugar free hard candies for throat soothing Continue Protonix. Reschedule appointment in [facility] for assessment of reflux. Follow up in 1 month with FENO with [name] or MR Please contact office for sooner follow up if symptoms do not improve or worsen or seek emergency care

## 2017-07-10 ENCOUNTER — Other Ambulatory Visit: Payer: Medicare Other

## 2017-07-10 DIAGNOSIS — J4541 Moderate persistent asthma with (acute) exacerbation: Secondary | ICD-10-CM

## 2017-07-11 ENCOUNTER — Ambulatory Visit (INDEPENDENT_AMBULATORY_CARE_PROVIDER_SITE_OTHER): Payer: Medicare Other

## 2017-07-11 DIAGNOSIS — J8289 Other pulmonary eosinophilia, not elsewhere classified: Secondary | ICD-10-CM

## 2017-07-11 DIAGNOSIS — J82 Pulmonary eosinophilia, not elsewhere classified: Secondary | ICD-10-CM | POA: Diagnosis not present

## 2017-07-12 DIAGNOSIS — J82 Pulmonary eosinophilia, not elsewhere classified: Secondary | ICD-10-CM | POA: Diagnosis not present

## 2017-07-12 MED ORDER — MEPOLIZUMAB 100 MG ~~LOC~~ SOLR
100.0000 mg | SUBCUTANEOUS | Status: DC
  Administered 2017-07-12: 100 mg via SUBCUTANEOUS

## 2017-07-12 NOTE — Progress Notes (Signed)
Documentation of medication administration and charges of Nucala have been completed by Lindsay Lemons, CMA based on the Nucala documentation sheet completed by Tammy Scott.   

## 2017-07-24 ENCOUNTER — Other Ambulatory Visit: Payer: Self-pay | Admitting: Pulmonary Disease

## 2017-07-31 ENCOUNTER — Telehealth: Payer: Self-pay | Admitting: Internal Medicine

## 2017-08-01 NOTE — Telephone Encounter (Signed)
1 Vial Order Date: 08/01/2017 Shipping Date: 08/02/2017

## 2017-08-03 NOTE — Telephone Encounter (Signed)
1 vial Arrival Date:08/03/17 Lot #: [id number]

## 2017-08-07 ENCOUNTER — Other Ambulatory Visit: Payer: Self-pay | Admitting: Psychiatry

## 2017-08-07 ENCOUNTER — Ambulatory Visit: Payer: Medicare Other | Admitting: Internal Medicine

## 2017-08-07 DIAGNOSIS — G35 Multiple sclerosis: Secondary | ICD-10-CM

## 2017-08-08 ENCOUNTER — Encounter: Payer: Self-pay | Admitting: Internal Medicine

## 2017-08-08 ENCOUNTER — Other Ambulatory Visit (INDEPENDENT_AMBULATORY_CARE_PROVIDER_SITE_OTHER): Payer: Medicare Other

## 2017-08-08 ENCOUNTER — Ambulatory Visit: Payer: Medicare Other | Admitting: Internal Medicine

## 2017-08-08 ENCOUNTER — Ambulatory Visit: Payer: Medicare Other

## 2017-08-08 VITALS — BP 122/78 | HR 84 | Ht 62.0 in | Wt 155.0 lb

## 2017-08-08 DIAGNOSIS — J4541 Moderate persistent asthma with (acute) exacerbation: Secondary | ICD-10-CM | POA: Diagnosis not present

## 2017-08-08 DIAGNOSIS — J82 Pulmonary eosinophilia, not elsewhere classified: Secondary | ICD-10-CM

## 2017-08-08 DIAGNOSIS — J8289 Other pulmonary eosinophilia, not elsewhere classified: Secondary | ICD-10-CM

## 2017-08-08 LAB — CBC WITH DIFFERENTIAL/PLATELET
BASOS PCT: 0.6 % (ref 0.0–3.0)
Basophils Absolute: 0 10*3/uL (ref 0.0–0.1)
EOS ABS: 0 10*3/uL (ref 0.0–0.7)
Eosinophils Relative: 0.5 % (ref 0.0–5.0)
HEMATOCRIT: 43 % (ref 36.0–46.0)
Hemoglobin: 14.4 g/dL (ref 12.0–15.0)
LYMPHS PCT: 26.2 % (ref 12.0–46.0)
Lymphs Abs: 1.5 10*3/uL (ref 0.7–4.0)
MCHC: 33.5 g/dL (ref 30.0–36.0)
MCV: 91.7 fl (ref 78.0–100.0)
Monocytes Absolute: 0.8 10*3/uL (ref 0.1–1.0)
Monocytes Relative: 13.7 % — ABNORMAL HIGH (ref 3.0–12.0)
NEUTROS ABS: 3.4 10*3/uL (ref 1.4–7.7)
Neutrophils Relative %: 59 % (ref 43.0–77.0)
PLATELETS: 250 10*3/uL (ref 150.0–400.0)
RBC: 4.69 Mil/uL (ref 3.87–5.11)
RDW: 13.2 % (ref 11.5–15.5)
WBC: 5.8 10*3/uL (ref 4.0–10.5)

## 2017-08-08 LAB — NITRIC OXIDE: Nitric Oxide: 162

## 2017-08-08 MED ORDER — PREDNISONE 10 MG PO TABS: ORAL_TABLET | ORAL | 0 refills | Status: DC

## 2017-08-08 NOTE — Progress Notes (Signed)
Subjective:     Patient ID: [name], female   DOB: [birth year], 72 y.o.   MRN: [id number]  HPI OV 11/30/2016  Chief Complaint  Patient presents with  . Follow-up    Pt here after acute visit with TP on 8.31.2018. Pt states she is feeling improved but not back to baseline. Pt states she is still having prod cough with pale yellow mucus, still some SOB. Pt denies CP/tightness, f/c/s.      72 year old female with moderate persistent asthma. Last seen 11/25/2013 by her practitioner. Given prednisone and Z-Pak. Currently she is improved. Pulmonary function test yesterday shows normalized FEV1 and DLCO. Nitric oxide was high 3 weeks ago. She is here with [name]. Her son has graduated from anesthesia residency at the [place]. She is worried about repeated it takes of steroids. She admitted to noncompliance with Symbicort and Singulair up until May 2018. She is also worried about her monoclonal antibody infusion for multiple sclerosis which she gets every 6 months for the last year or 2. She says after that she gets extremely fatigued and might require a prednisone burst. Occasionally she is not a respiratory infection. However she's been advised by her neurologist to continue with these injections in order to support her multiple sclerosis. There are no other new issues. She had a 7 mm lung nodule that resolved a year ago  72 year old female never smoker followed for moderate persistent asthma and lung nodule. MS on Infusion -OCREVUS every 6 months  Son is an anesthesiologist   01/13/2017 Follow up : Asthma  Pt returns for follow up for Asthma . She was seen on 12/27/16 for asthma bronchitic flare with 3 week hx of productive cough with thick yellow mucus and wheezing . Her FENO testing was elevated . She was treated with Zpack and Prednisone taper. Says she did get better with less cough, congestion and wheezing . She is concerned because she gets intermittent wheezing  especially in am. Goes away after she uses Symbicort.  No fever, discolored mucus, chest pain, orthopnea,.  She remains on Symbicort, Singulair and Zyrtec. Says she is compliant .  She says she and family are concerned regarding steroids use. We went over steroid use and potential side effects and goal is to limit use as much as possible . Last ov Nucala was ordered and is under approval process . This was added to help with improved asthma control .  She is concerned for adrenal insufficiency , discussed that it can occur with prolonged steroid use. She has been on three 1 week steroid tapers over last 4 months . Suggested she discuss this with PCP to have additional testing or referral to endocrinology if indicated.  FENO testing today is slightly improved at 150 .     Acute 01/27/17 72 year old with history of persistent asthma, multiple sclerosis on  complains of increasing dyspnea for the past 1 week associated with yellow mucus, wheezing, chest tightness.  She denies any nebulizer but has not received them yet.  Paperwork is underway for initiation of nucala but has not been started yet.   She has significant history of GERD with esophageal spasm, atypical chest pain.  She follows at [facility] for this.  She is taking Protonix alternating with Zyrtec.     OV 02/08/2017  Chief Complaint  Patient presents with  . Follow-up    Pt last seen by PM on 11.2.2018 for an acute visit. Pt states she started to [employer]  but now is feeling like she is catching a cold again. Pt c/o prod cough with yellow mucus. Pt deniee SOB, CP/tihgtness and f/c/s.      72 year old female with eosinophilic and allergic asthma.  Presents for follow-up.  Most recently seen January 27, 2017 by my colleague for asthma exacerbation.  She is on her last day of prednisone today.  She feels improved.  Her nitric oxide has been the lowest it has been in 2018 which is 115 ppb but still significantly elevated.  She feels she is  catching a cold although she feels overall better.  She is on Symbicort schedule and Singulair scheduled.  She is not on any inhaled anticholinergic.  She is awaiting her interleukin-5 receptor antibody treatment.  She is worried about taking this because of cross interference with the other monoclonal antibody that she takes for her multiple sclerosis.  We had a discussion on this.  I did tell her that the cross-reactive and side effect profile is unknown and the general unpredictability can be higher.  Despite this we thought that the overall risk benefit ratio is in favor of her taking her interleukin-5 receptor antibody for asthma.   TEST  CT chest 01/2016 7 mm nodule resolved, stable other nodules , improved tree in bud nodularity  09/2016 IgE 4, neg RAST  09/2016 Eosinophils 300-400  Feno >.150 10/2016 >187 12/27/16  PFT 11/2016 >nml  FEV 1 , no restriction or obstruction , nml DLCO   OV 04/19/2017  Chief Complaint  Patient presents with  . Follow-up    has had 2 ED visit in December 2018 with asthma/bronchitis,no admission,feels like breathing at this time is better,she can tell improvement   Follow-up allergic asthma with eosinophilic asthma but normal IgE  She is here with [name] who runs a daycare for after school kids at [employer].  Her son is no longer at the [place] but is now working in [place].  She tells me currently her asthma is stable.  She and her daughter are very categorical that the whole asthma started after she started taking monoclonal antibody infusions for her multiple sclerosis.   there is an infusion administered over 6 hours every 6 months.  She first started this infusion around November 2017.  She tells me that she has infusion reactions that start within a day or so of receiving the infusion.  These infusion reactions are characterized by body ache and fever and aggravation of past medical issues such as the meniscus.  She also has throat  irritation and bronchospasm.  Then some 2 weeks later like clockwork she gets acute bronchitis which is then treated as an asthma exacerbation.  She also has significant fatigue.  She says it takes her months to recover from this and she has a normal good 5 months before the cycle is repeated again.  Most recently the infusion was in November 2018 and then she ended up in the ER for asthma exacerbation in December 2018.  She was supposed to start interleukin-5 receptor antibody for asthma that was poorly controlled and because of persistent eosinophilia and high nitric oxide but given the cycle she and her daughter have decided against Nucala especially because she is convinced it is the [place] for multiple sclerosis that is causing this problem.  She is going to skip her next multiple sclerosis infusion in May 2019.  At this point in time she is compliant with her Symbicort.  Med review shows  coreg   OV 05/10/2017  Chief Complaint  Patient presents with  . Acute Visit    Pt has complaints of coughing with thick yellow mucus and occ. dark brown mucus along with wheezing and hoarseness. Pt denies any fever.   86 -year-old female with asthma eosinophilic and moderate persistent on Symbicort.  She presents for an acute visit.  Since seeing me 3 weeks ago she had gradual deterioration in symptoms.  She is having significant nocturnal symptoms.  Her friends are telling her that her voice is hoarse.  She says she is compliant with her Symbicort and Singulair.  Her interleukin-5 receptor antibody is on hold because she is worried about interactions with the monoclonal antibody for multiple sclerosis still in her system and the washout of that is expected only June 2019.  Currently her asthma control questionnaire shows a score of 3.6 showing significant symptoms.  She is waking up a few times at night because of asthma symptoms and when she wakes up she has moderate symptoms.  She is moderately limited in her  activities because of asthma.  She is short of breath quite a bit and wheezing all the time although she hold off on using her albuterol for rescue.  Med review shows that she is on nonspecific beta blocker carvedilol.  There is no fever or chills.    OV 06/08/2017  Chief Complaint  Patient presents with  . Follow-up    Pt has had problems with side effects from the spiriva due to eye problems and dry mouth.  Breo and spiriva had helped with the wheezing but due to side effects, meds were stopped x2 days. States she had a coughing fit with the meds as well. Pt has c/o cough with yellow mucus, SOB, and is back to wheezing.   72 year old female with asthma eosinophilic and moderate persistent.  At last visit she was in acute visit.  I added Spiriva to her regimen.  Switch to Symbicort 2 higher dose Brio.  I asked her to continue her Singulair.  She still was not interested in her biologic therapy given her issues doing biologic therapy with multiple sclerosis.  We also had her talk to her physician and get her nonspecific beta-blocker carvedilol switched out.  She did all this.  However she tells me that Spiriva is causing dry mouth and she stopped it like a week ago.  For some unclear reason she also stopped the Brio for a few days a week ago and then again for a few days earlier this week and then starting yesterday or like 2 days ago started having worsening asthma symptoms.  She is now reporting significant cough.  When she wakes up she has mild symptoms when she is active she is very slightly limited because of her asthma.  She is short of breath a moderate amount because of the asthma and is wheezing a moderate amount because of the asthma and in the last 2 days most of the time and she is now using nebulizer for rescue 2 times daily.  Average asthma control question as 2.2.  Her exact nitric oxide test continues to be elevated at 156 as always.  She is now accepting that she will have to go through  biologic therapy.  Her daughter status with her.  The questioning alternative etiologies.  She tells me that she might have mold in the house.  Review of the labs show she had normal allergy blood test in July 2018 and  a clear chest x-ray in December 2018 [CT chest in November showed tree-in-bud in the right upper lobe 1 month earlier] IgE was normal back in July 2018.  Aspergillus panel never checked.   07/06/2017  Pt. Was seen for an acute flare 06/08/2017 after stopping her Breo. At that visit, Dr. Golden Pop plan was as follows: Plan Retest for ABPA             - check IgE, aspergillus antibody blood test , do CXR Take prednisone 40 mg daily x 2 days, then 20mg  daily x 2 days, then 10mg  daily x 2 days, then 5mg  daily x 2 days and stop Start dulera high dose - you need to be on inhaled steroids No spiriva for you Continue singulair Glad coreg got changed out Start Nucala starting next week FENO at this visit was 156 ppb  Followup 4-6 weeks or sooner if needed with myself or APP             - feno and ACQ at follow up  Pt. Presents today . She states she completed he prednisone taper . She has been compliant with her Dulera, Singulair and Zyrtec. She has not had to use her rescue inhaler. She is not coughing. She states maybe once daily. Her cough is productive for yellow secretions on the occasion she does cough. She has had 1 Nucala injection.She did have some fatigue 1 week after the injection. It lasted x 1 day. She did have some mild arm tenderness one week after the injection. She does feel she is better than she was last month.She denies fever, chest pain, orthopnea or hemoptysis.    OV 08/08/2017  Chief Complaint  Patient presents with  . Follow-up    Pt states she is coughing up a lot of yellow mucus in the mornings. Other than the cough, pt states she has been doing good.    [name] presents for follow-up.  She has severe eosinophilic asthma that is poorly controlled  with persistently significant high elevated nitric oxide levels  I personally saw her in mid March 2019.  At that time I recommended starting nucala.  She is now had 2 doses 4 weeks apart of that same injection.  Today is the third dose.  She says for the last few weeks she is noticing increased sputum production and loosening of the sputum and a yellow color in the sputum.  She feels that it is because the injection is working.  She does not feel it is the asthma exacerbation.  Her exam nitric oxide continues to be very high at 162 today.  She says she is compliant with her Dulera.  In terms of her multiple sclerosis she is coming off Ocrivis and going to sart on TYSABRI.  the care for this is being coordinated by Dr. Gorden Harms.  And apparently the neurologist was wondering about she says that between the last visit in this current visit she got her immunoglobulin checked by him and all these levels were low the correlation of that and asthma.  With in our labs we do notice that the IgE levels are low.  Currently she is not running any fever or chills or orthopnea or active wheezing    Results for Robyn, [name] (MRN [id number]) as of 04/19/2017 10:02  Ref. Range 10/07/2016 12:29 11/10/2016 15:22 12/27/2016 17:04 01/27/2017 15:29 02/08/2017 10:30 05/10/2017  06/08/2017  08/08/2017   Nitric Oxide Unknown 180 150 187 146 115 153 156 162  ACQ score       3.6   2.2     Results for Robyn, [name] (MRN [id number]) as of 04/19/2017 10:02  Ref. Range 05/09/2009 12:25 05/10/2009 04:32 10/07/2016 13:11 10/07/2016 20:51 03/19/2017 15:45  Eosinophils Absolute Latest Ref Range: 0.0 - 0.7 K/uL 0.0 0.1 0.4 0.3 0.3   Results for Robyn, [name] (MRN [id number]) as of 05/10/2017 12:14  Ref. Range 10/07/2016 13:11  IgEcoming (Immunoglobulin E), Serum Latest Ref Range: <115 kU/L 4       has a past medical history of Anemia, Anginal pain (HCC), Arthritis, Complication of anesthesia, Coronary artery disease, Dry eyes,  Esophageal spasm, GERD (gastroesophageal reflux disease), and MS (multiple sclerosis) ([place]).   reports that she has never smoked. She has never used smokeless tobacco.  Past Surgical History:  Procedure Laterality Date  . ABDOMINAL HYSTERECTOMY    . BLEPHAROPLASTY Bilateral   . CARDIAC CATHETERIZATION  10/04/06   MINOR CAD,SINGLE VESSEL INVOLVING THE CIRCUMFLEX. 20 TO 30% PROXIMALLY AND 10 TO 20% IN THE MIDDLE SEGMENT.MILD MUSCLE BRIDGING, MID LAD.NORMAL RCA.NORMAL LV FUNCTION.NORMAL MITRAL AND AORTIC VALVE.NORMAL APPEARING AORTA,THORACIC AND ABDOMINAL.NORMAL RENAL ARTERIES.  [name] CARDIOLOGY NUCLEAR MED STUDY  06/22/12   NL LV FUNCTION,EF 68%,NL WALL MOTION.  [name] CAROTID DUPLEX  07/02/11   KVQ:QVZD SOFT PLAQUE NOTED DISTAL CCA AND ORGIN AND PROXIMAL ICA,LEFT>RIGHT.NO ICA STENOSIS. VERTEBRAL ARTERY FLOW IS ANTEGRADE.  [name] CESAREAN SECTION     x2   . COLONOSCOPY WITH PROPOFOL N/A 12/11/2014   Procedure: COLONOSCOPY WITH PROPOFOL;  Surgeon: Ronald Lobo, MD;  Location: [facility];  Service: Endoscopy;  Laterality: N/A;  . KNEE ARTHROSCOPY Left    scope  . PARATHYROIDECTOMY     partial-many years ago  . thumb surgery Bilateral    built up and bone removal  . TONSILLECTOMY    . TRANSTHORACIC ECHOCARDIOGRAM  07/02/11   LV CAVITY SIZE IS NORMAL. SYSTOLIC FUNCTION WAS NORMAL.EF=55% TO 60%.INCREASED RELATIVE CONTRIBUTION OF ATRIAL CONTRACTION TO VENTRICULAR FILLING;MAYBE DUE TO HYPOVOLEMIA. AV=MILD REGURG.    Allergies  Allergen Reactions  . Crestor [Rosuvastatin] Other (See Comments)    Joint pain   . Vicodin [Hydrocodone-Acetaminophen]     Made pass-out one time and is okay taking now  . Isovue [Iopamidol]     Pt had sneezing and itching of her throat and soft palate.  Dr Alvester Chou checked pt.  She will need premeds in the future.  J Bohm    Immunization History  Administered Date(s) Administered  . Influenza, High Dose Seasonal PF 12/22/2013, 12/14/2015, 12/14/2016  . Influenza,inj,Quad PF,6+  Mos 11/27/2014  . Pneumococcal Polysaccharide-23 10/26/2013  . Pneumococcal-Unspecified 10/26/2013  . Tdap 06/14/2011    Family History  Problem Relation Age of Onset  . Heart disease Mother   . Asthma Father   . Bladder Cancer Father   . Heart failure Father   . Hyperlipidemia Brother      Current Outpatient Medications:  .  albuterol (PROVENTIL HFA;VENTOLIN HFA) 108 (90 Base) MCG/ACT inhaler, Inhale 1-2 puffs into the lungs every 6 (six) hours as needed for wheezing or shortness of breath., Disp: 1 Inhaler, Rfl: 5 .  aspirin EC 81 MG tablet, Take 1 tablet (81 mg total) by mouth daily., Disp: 90 tablet, Rfl: 3 .  calcium-vitamin D (OSCAL WITH D) 500-200 MG-UNIT tablet, Take 1 tablet by mouth daily., Disp: , Rfl:  .  cetirizine (ZYRTEC ALLERGY) 10 MG tablet, Take 1 tablet (10 mg total) by mouth daily., Disp: 30 tablet, Rfl:  5 .  Cholecalciferol (VITAMIN D PO), Take 1 tablet by mouth daily., Disp: , Rfl:  .  CINNAMON PO, Take 1 tablet by mouth daily., Disp: , Rfl:  .  diazepam (VALIUM) 2 MG tablet, , Disp: , Rfl:  .  isosorbide mononitrate (IMDUR) 60 MG 24 hr tablet, TAKE 1 TABLET ONCE DAILY., Disp: 30 tablet, Rfl: 11 .  levothyroxine (SYNTHROID, LEVOTHROID) 25 MCG tablet, Take 25 mcg daily before breakfast by mouth., Disp: , Rfl:  .  losartan (COZAAR) 50 MG tablet, , Disp: , Rfl:  .  mometasone-formoterol (DULERA) 200-5 MCG/ACT AERO, Inhale 2 puffs into the lungs 2 (two) times daily., Disp: 1 Inhaler, Rfl: 5 .  montelukast (SINGULAIR) 10 MG tablet, Take 1 tablet (10 mg total) by mouth at bedtime., Disp: 30 tablet, Rfl: 11 .  Multiple Vitamin (MULTIVITAMIN WITH MINERALS) TABS tablet, Take 1 tablet by mouth daily., Disp: , Rfl:  .  pantoprazole (PROTONIX) 20 MG tablet, TAKE 1 TABLET BY MOUTH TWICE DAILY., Disp: 60 tablet, Rfl: 0 .  PARoxetine (PAXIL) 10 MG tablet, Take 5 mg by mouth daily. , Disp: , Rfl:  .  Respiratory Therapy Supplies (FLUTTER) DEVI, Use as directed, Disp: 1 each,  Rfl: 0 .  VYTORIN 10-20 MG per tablet, Take 1 tablet by mouth daily. , Disp: , Rfl:  .  nitroGLYCERIN (NITROSTAT) 0.4 MG SL tablet, Place 1 tablet (0.4 mg total) under the tongue every 5 (five) minutes as needed for chest pain. (Patient not taking: Reported on 08/08/2017), Disp: 25 tablet, Rfl: 3 .  ocrelizumab (OCREVUS) 300 MG/10ML injection, Inject 600 mg into the vein every 6 (six) months., Disp: , Rfl:   Current Facility-Administered Medications:  [name]  Mepolizumab SOLR 100 mg, 100 mg, Subcutaneous, Q28 days, Brand Males, MD, 100 mg at 06/14/17 0917 .  Mepolizumab SOLR 100 mg, 100 mg, Subcutaneous, Q28 days, Brand Males, MD, 100 mg at 07/12/17 6195   Review of Systems     Objective:   Physical Exam  Constitutional: She is oriented to person, place, and time. She appears well-developed and well-nourished. No distress.  HENT:  Head: Normocephalic and atraumatic.  Right Ear: External ear normal.  Left Ear: External ear normal.  Mouth/Throat: Oropharynx is clear and moist. No oropharyngeal exudate.  Eyes: Pupils are equal, round, and reactive to light. Conjunctivae and EOM are normal. Right eye exhibits no discharge. Left eye exhibits no discharge. No scleral icterus.  Neck: Normal range of motion. Neck supple. No JVD present. No tracheal deviation present. No thyromegaly present.  Cardiovascular: Normal rate, regular rhythm, normal heart sounds and intact distal pulses. Exam reveals no gallop and no friction rub.  No murmur heard. Pulmonary/Chest: Effort normal and breath sounds normal. No respiratory distress. She has no wheezes. She has no rales. She exhibits no tenderness.  Abdominal: Soft. Bowel sounds are normal. She exhibits no distension and no mass. There is no tenderness. There is no rebound and no guarding.  Musculoskeletal: Normal range of motion. She exhibits no edema or tenderness.  Lymphadenopathy:    She has no cervical adenopathy.  Neurological: She is alert and  oriented to person, place, and time. She has normal reflexes. No cranial nerve deficit. She exhibits normal muscle tone. Coordination normal.  Skin: Skin is warm and dry. No rash noted. She is not diaphoretic. No erythema. No pallor.  Psychiatric: She has a normal mood and affect. Her behavior is normal. Judgment and thought content normal.  Vitals reviewed.  Today's Vitals  08/08/17 1032  BP: 122/78  Pulse: 84  SpO2: 95%  Weight: 155 lb (70.3 kg)  Height: 5\' 2"  (1.575 m)    Estimated body mass index is 28.35 kg/m as calculated from the following:   Height as of this encounter: 5\' 2"  (1.575 m).   Weight as of this encounter: 155 lb (70.3 kg).     Assessment:       ICD-10-CM   1. Moderate persistent asthma with exacerbation J45.41   2. Pulmonary eosinophilia ([place]) J82        Plan:      You have some exacerbatioin again but ok to get Nucala 08/08/2017   Plan - Please take Take prednisone 40mg  once daily x 3 days, then 30mg  once daily x 3 days, then 20mg  once daily x 3 days, then prednisone 10mg  once daily  x 3 days and then prednisone 5mg  daily x 3 days and stop - might have to consider daily prednisone at some point if Nucala ineffective or [facility] 2nd opinon - continue dulera - ok for nucala 08/08/2017 - do cbc with diff 08/08/2017  To see if nucala has kicked in - use albuterol as needed - use mucinex as needed - sign release to get Immuneglobulin levels checked from Dr [name] - I will try to read about TySABRI your new MS med that is being proposed - at some point might consider a [facility] referral  Followup  8 weeks with FeNO and ACQ    Dr. Brand Males, M.D., [facility].C.P Pulmonary and Critical Care Medicine Staff Physician, [place] Director - Interstitial Lung Disease  Program  Pulmonary [place] [facility] [place], [place], [address]  Pager: [phone], If no answer or between  15:00h - 7:00h: call [phone] Telephone: [phone]

## 2017-08-08 NOTE — Patient Instructions (Addendum)
ICD-10-CM   1. Moderate persistent asthma with exacerbation J45.41   2. Pulmonary eosinophilia ([place]) J82     You have some exacerbatioin again but ok to get Nucala 08/08/2017   Plan - Please take Take prednisone 40mg  once daily x 3 days, then 30mg  once daily x 3 days, then 20mg  once daily x 3 days, then prednisone 10mg  once daily  x 3 days and then prednisone 5mg  daily x 3 days and stop - might have to consider daily prednisone at some point if Nucala ineffective or [facility] 2nd opinon - continue dulera - ok for nucala 08/08/2017 - do cbc with diff 08/08/2017  To see if nucala has kicked in - use albuterol as needed - use mucinex as needed - sign release to get Immuneglobulin levels checked from Dr [name] - I will try to read about TySABRI your new MS med that is being proposed - at some point might consider a [facility] referral  Followup  8 weeks with FeNO and ACQ

## 2017-08-09 MED ORDER — MEPOLIZUMAB 100 MG ~~LOC~~ SOLR
100.0000 mg | Freq: Once | SUBCUTANEOUS | Status: AC
  Administered 2017-08-08: 100 mg via SUBCUTANEOUS

## 2017-08-09 NOTE — Addendum Note (Signed)
Addended by: Clayborne Dana C on: 08/09/2017 12:39 PM   Modules accepted: Orders

## 2017-08-14 ENCOUNTER — Telehealth: Payer: Self-pay | Admitting: Internal Medicine

## 2017-08-14 ENCOUNTER — Other Ambulatory Visit: Payer: Medicare Other

## 2017-08-14 MED ORDER — NYSTATIN 100000 UNIT/ML MT SUSP: OROMUCOSAL | 0 refills | Status: DC

## 2017-08-14 NOTE — Telephone Encounter (Signed)
Spoke with pt, she states every time she takes prednisone she develops thrush. It started four days ago and she has a burning sensation in her mouth. She would like the suspension, not the tablets. [name] can we send something in for pt?    [place]  Current Outpatient Medications on File Prior to Visit  Medication Sig Dispense Refill  . albuterol (PROVENTIL HFA;VENTOLIN HFA) 108 (90 Base) MCG/ACT inhaler Inhale 1-2 puffs into the lungs every 6 (six) hours as needed for wheezing or shortness of breath. 1 Inhaler 5  . aspirin EC 81 MG tablet Take 1 tablet (81 mg total) by mouth daily. 90 tablet 3  . calcium-vitamin D (OSCAL WITH D) 500-200 MG-UNIT tablet Take 1 tablet by mouth daily.    . cetirizine (ZYRTEC ALLERGY) 10 MG tablet Take 1 tablet (10 mg total) by mouth daily. 30 tablet 5  . Cholecalciferol (VITAMIN D PO) Take 1 tablet by mouth daily.    [name] CINNAMON PO Take 1 tablet by mouth daily.    . diazepam (VALIUM) 2 MG tablet     . isosorbide mononitrate (IMDUR) 60 MG 24 hr tablet TAKE 1 TABLET ONCE DAILY. 30 tablet 11  . levothyroxine (SYNTHROID, LEVOTHROID) 25 MCG tablet Take 25 mcg daily before breakfast by mouth.    . losartan (COZAAR) 50 MG tablet     . mometasone-formoterol (DULERA) 200-5 MCG/ACT AERO Inhale 2 puffs into the lungs 2 (two) times daily. 1 Inhaler 5  . montelukast (SINGULAIR) 10 MG tablet Take 1 tablet (10 mg total) by mouth at bedtime. 30 tablet 11  . Multiple Vitamin (MULTIVITAMIN WITH MINERALS) TABS tablet Take 1 tablet by mouth daily.    . nitroGLYCERIN (NITROSTAT) 0.4 MG SL tablet Place 1 tablet (0.4 mg total) under the tongue every 5 (five) minutes as needed for chest pain. (Patient not taking: Reported on 08/08/2017) 25 tablet 3  . ocrelizumab (OCREVUS) 300 MG/10ML injection Inject 600 mg into the vein every 6 (six) months.    . pantoprazole (PROTONIX) 20 MG tablet TAKE 1 TABLET BY MOUTH TWICE DAILY. 60 tablet 0  . PARoxetine (PAXIL) 10 MG tablet Take  5 mg by mouth daily.     . predniSONE (DELTASONE) 10 MG tablet 40mg  x3 day, then 30mg  x3 day, then 20mg x3 day, then 10mg  x3 day, then 5mg  x3 day , then STOP 32 tablet 0  . Respiratory Therapy Supplies (FLUTTER) DEVI Use as directed 1 each 0  . VYTORIN 10-20 MG per tablet Take 1 tablet by mouth daily.      Current Facility-Administered Medications on File Prior to Visit  Medication Dose Route Frequency Provider Last Rate Last Dose  . Mepolizumab SOLR 100 mg  100 mg Subcutaneous Q28 days Brand Males, MD   100 mg at 06/14/17 0917  . Mepolizumab SOLR 100 mg  100 mg Subcutaneous Q28 days Brand Males, MD   100 mg at 07/12/17 7169   Allergies  Allergen Reactions  . Crestor [Rosuvastatin] Other (See Comments)    Joint pain   . Vicodin [Hydrocodone-Acetaminophen]     Made pass-out one time and is okay taking now  . Isovue [Iopamidol]     Pt had sneezing and itching of her throat and soft palate.  Dr Alvester Chou checked pt.  She will need premeds in the future.  Alfonse Alpers

## 2017-08-14 NOTE — Telephone Encounter (Signed)
#   Oral thrush - For Oral thrush: Take Suspension (swish and swallow): 500,000 units 4 times/day for 5 days; swish in the mouth and retain for as long as possible (several minutes) before swallowing.    IF nystatin is in back order: alternatives are  A) clotrimazole troche (throt lozenge) 10mg dissolved and swish and swallow 5 times a day for 14 days.   

## 2017-08-14 NOTE — Telephone Encounter (Signed)
Called pt and advised message from the provider. Pt understood and verbalized understanding. Nothing further is needed.    Rx sent in. 

## 2017-08-15 ENCOUNTER — Ambulatory Visit: Payer: Medicare Other | Admitting: Internal Medicine

## 2017-08-17 ENCOUNTER — Other Ambulatory Visit: Payer: Medicare Other

## 2017-08-18 ENCOUNTER — Ambulatory Visit
Admission: RE | Admit: 2017-08-18 | Discharge: 2017-08-18 | Disposition: A | Discharge status: Home / Self Care | Payer: Medicare Other | Source: Ambulatory Visit | Attending: Psychiatry | Admitting: Psychiatry

## 2017-08-18 DIAGNOSIS — G35 Multiple sclerosis: Secondary | ICD-10-CM

## 2017-08-18 MED ORDER — GADOBENATE DIMEGLUMINE 529 MG/ML IV SOLN
14.0000 mL | Freq: Once | INTRAVENOUS | Status: AC | PRN
  Administered 2017-08-18: 14 mL via INTRAVENOUS

## 2017-08-22 ENCOUNTER — Encounter (HOSPITAL_COMMUNITY)
Admission: RE | Admit: 2017-08-22 | Discharge: 2017-08-22 | Disposition: A | Discharge status: Home / Self Care | Payer: Medicare Other | Source: Ambulatory Visit | Attending: Psychiatry | Admitting: Psychiatry

## 2017-08-22 DIAGNOSIS — G35 Multiple sclerosis: Secondary | ICD-10-CM | POA: Insufficient documentation

## 2017-08-23 ENCOUNTER — Ambulatory Visit: Payer: Medicare Other | Admitting: Internal Medicine

## 2017-08-23 ENCOUNTER — Encounter: Payer: Self-pay | Admitting: Internal Medicine

## 2017-08-23 DIAGNOSIS — R768 Other specified abnormal immunological findings in serum: Secondary | ICD-10-CM | POA: Diagnosis not present

## 2017-08-23 NOTE — Progress Notes (Signed)
[facility]      Reason for Consult: "Low Immunoglobulins"    Referring Physician: Dr. Effie Shy    Patient ID: [name], female    DOB: 27-Jan-1946, 72 y.o.   MRN: [id number]  HPI:   She is here for evaluation of above.  She has a history of Multiple Sclerosis and has been getting infusion of Ocrevus for her relapsing disease, which has been effective for her.  Unfortunately though, she has had upper respiratory reactions after her infusions.  In her history today, she describes airway reactivity relieved with albuterol inhalers and prednisone.  Not associated with fever, chills.  No rash.  CT scans and CXR in December of 2018 suggested inflammatory disease such as reactive airway vs bronchitis.  She describes her episodes and having a yellowish frothy sputum, tends to increase as she is getting better.  CXR and CTs independently reviewed, and no opacity. Immunoglobulin test was done with a low CD19 count.   Previous record reviewed in Epic from [name].  Being treated for asthma.    Past Medical History:  Diagnosis Date  . Anemia    past history-many yrs ago  . Anginal pain ([place])    being evaluated by Dr. Tyrone Sage, arm pain,"bad indigestion" -no heart related findings as of yet  . Arthritis    hip. back pain  . Complication of anesthesia    s/p Hysterectomy "vagal response "heart stopped" -did not require shocking.  . Coronary artery disease   . Dry eyes   . Esophageal spasm   . GERD (gastroesophageal reflux disease)   . MS (multiple sclerosis) ([place])    stable-sees [name] every 6 months    Prior to Admission medications   Medication Sig Start Date End Date Taking? Authorizing Provider  albuterol (PROVENTIL HFA;VENTOLIN HFA) 108 (90 Base) MCG/ACT inhaler Inhale 1-2 puffs into the lungs every 6 (six) hours as needed for wheezing or shortness of breath. 06/13/17  Yes Brand Males, MD  aspirin EC 81 MG tablet Take 1 tablet (81  mg total) by mouth daily. 04/16/15  Yes Barrett, Evelene Croon, PA-C  calcium-vitamin D (OSCAL WITH D) 500-200 MG-UNIT tablet Take 1 tablet by mouth daily.   Yes [provider]  cetirizine (ZYRTEC ALLERGY) 10 MG tablet Take 1 tablet (10 mg total) by mouth daily. 11/10/16  Yes Parrett, Tammy S, NP  Cholecalciferol (VITAMIN D PO) Take 1 tablet by mouth daily.   Yes [provider]  CINNAMON PO Take 1 tablet by mouth daily.   Yes [provider]  diazepam (VALIUM) 2 MG tablet  06/19/17  Yes [provider]  isosorbide mononitrate (IMDUR) 60 MG 24 hr tablet TAKE 1 TABLET ONCE DAILY. 03/20/17  Yes Troy Sine, MD  levothyroxine (SYNTHROID, LEVOTHROID) 25 MCG tablet Take 25 mcg daily before breakfast by mouth.   Yes [provider]  losartan (COZAAR) 50 MG tablet  06/06/17  Yes [provider]  mometasone-formoterol (DULERA) 200-5 MCG/ACT AERO Inhale 2 puffs into the lungs 2 (two) times daily. 06/08/17  Yes Brand Males, MD  montelukast (SINGULAIR) 10 MG tablet Take 1 tablet (10 mg total) by mouth at bedtime. 10/07/16  Yes Parrett, Tammy S, NP  Multiple Vitamin (MULTIVITAMIN WITH MINERALS) TABS tablet Take 1 tablet by mouth daily.   Yes [provider]  nitroGLYCERIN (NITROSTAT) 0.4 MG SL tablet Place 1 tablet (0.4 mg total) under the tongue every 5 (five) minutes as needed for  chest pain. 08/29/13  Yes Troy Sine, MD  nystatin (MYCOSTATIN) 100000 UNIT/ML suspension Swish and swallow 40mL four times daily x5-7days 08/14/17  Yes Ramaswamy, Belva Crome, MD  ocrelizumab (OCREVUS) 300 MG/10ML injection Inject 600 mg into the vein every 6 (six) months.   Yes [provider]  omeprazole (PRILOSEC OTC) 20 MG tablet Take 20 mg by mouth daily.   Yes [provider]  pantoprazole (PROTONIX) 20 MG tablet TAKE 1 TABLET BY MOUTH TWICE DAILY. 07/24/17  Yes Mannam, Praveen, MD  PARoxetine (PAXIL) 10 MG tablet Take 5 mg by mouth daily.    Yes  [provider]  predniSONE (DELTASONE) 10 MG tablet 40mg  x3 day, then 30mg  x3 day, then 20mg x3 day, then 10mg  x3 day, then 5mg  x3 day , then STOP 08/08/17  Yes Brand Males, MD  Respiratory Therapy Supplies (FLUTTER) DEVI Use as directed 03/23/17  Yes Young, Clinton D, MD  VYTORIN 10-20 MG per tablet Take 1 tablet by mouth daily.  07/30/13  Yes [provider]    Allergies  Allergen Reactions  . Crestor [Rosuvastatin] Other (See Comments)    Joint pain   . Iodinated Diagnostic Agents Other (See Comments)    Sneezing and itchy throat; dye was Isovue 300  . Vicodin [Hydrocodone-Acetaminophen]     Made pass-out one time and is okay taking now  . Isovue [Iopamidol]     Pt had sneezing and itching of her throat and soft palate.  Dr Alvester Chou checked pt.  She will need premeds in the future.  J Bohm    Social History   Tobacco Use  . Smoking status: Never Smoker  . Smokeless tobacco: Never Used  Substance Use Topics  . Alcohol use: Yes    Alcohol/week: 0.0 oz    Comment: wine occ.  . Drug use: No    Family History  Problem Relation Age of Onset  . Heart disease Mother   . Asthma Father   . Bladder Cancer Father   . Heart failure Father   . Hyperlipidemia Brother     Review of Systems  Constitutional: negative for fevers, chills, malaise, anorexia and weight loss Respiratory: negative for cough, sputum, wheezing or dyspnea on exertion Gastrointestinal: negative for diarrhea Integument/breast: negative for rash All other systems reviewed and are negative    Constitutional: in no apparent distress and alert  Vitals:   08/23/17 1454  BP: 128/69  Pulse: 70  Temp: 97.8 F (36.6 C)   EYES: anicteric ENMT: no thrush Cardiovascular: Cor RRR Respiratory: CTA B; normal respiratory effort GI: Bowel sounds are normal, liver is not enlarged, spleen is not enlarged Musculoskeletal: no pedal edema noted Skin: negatives: no rash Hematologic: no cervical  lad  Labs: Lab Results  Component Value Date   WBC 5.8 08/08/2017   HGB 14.4 08/08/2017   HCT 43.0 08/08/2017   MCV 91.7 08/08/2017   PLT 250.0 08/08/2017    Lab Results  Component Value Date   CREATININE 0.72 03/19/2017   BUN 10 03/19/2017   NA 140 03/19/2017   K 3.8 03/19/2017   CL 107 03/19/2017   CO2 23 03/19/2017    Lab Results  Component Value Date   ALT 24 03/19/2017   AST 21 03/19/2017   ALKPHOS 75 03/19/2017   BILITOT 0.6 03/19/2017   INR 1.04 04/26/2015     Assessment: reaction to infusions vs new asthma somehow triggered by recent disease, infusions.   For the recurrent episodes of reactive airway, I  don't see any evidence of recurrent infections, history suggests reactive airway since it responds to steroids and inhalers.  A viral illness would worsen with that.  I agree with [name] and continued treated for reactive airway disease. For the Bcell and immunoglobulins, I do not know of any significance of minimal low levels of IgA, G or E in this setting for infection risk and a brief literature review did not find anything new.  Also with a low CD19, no known clinical significance other than the association with control of infections.    Plan: 1) continue care as per [name].  No further immune test recommended. This was discussed with the patient.   Thanks for the consultation.

## 2017-08-24 ENCOUNTER — Other Ambulatory Visit: Payer: Self-pay | Admitting: Pulmonary Disease

## 2017-08-24 ENCOUNTER — Telehealth: Payer: Self-pay | Admitting: Internal Medicine

## 2017-08-24 LAB — MYCOBACTERIA,CULT W/FLUOROCHROME SMEAR
MICRO NUMBER:: 90460243
SMEAR:: NONE SEEN
SPECIMEN QUALITY: ADEQUATE

## 2017-08-24 LAB — FUNGUS CULTURE W SMEAR
MICRO NUMBER: 90460242
SMEAR: NONE SEEN
SPECIMEN QUALITY:: ADEQUATE

## 2017-08-24 LAB — RESPIRATORY CULTURE OR RESPIRATORY AND SPUTUM CULTURE
MICRO NUMBER: 90460244
RESULT:: NORMAL
SPECIMEN QUALITY: ADEQUATE

## 2017-08-24 NOTE — Telephone Encounter (Signed)
1 Vial Order Date: 08/24/17 Shipping Date: 08/29/17

## 2017-08-29 ENCOUNTER — Telehealth: Payer: Self-pay | Admitting: Acute Care

## 2017-08-29 ENCOUNTER — Telehealth: Payer: Self-pay | Admitting: Adult Health

## 2017-08-30 ENCOUNTER — Telehealth: Payer: Self-pay | Admitting: Adult Health

## 2017-08-30 NOTE — Telephone Encounter (Signed)
Arrival Date: 08/30/17 # of Vials: 1 Lot #: [id number] Expiration Date: 07/2020

## 2017-08-30 NOTE — Telephone Encounter (Signed)
1 Vial Order Date: 08/29/17 Shipping Date: 08/29/17

## 2017-08-30 NOTE — Telephone Encounter (Signed)
Error

## 2017-09-05 ENCOUNTER — Ambulatory Visit (INDEPENDENT_AMBULATORY_CARE_PROVIDER_SITE_OTHER): Payer: Medicare Other

## 2017-09-05 DIAGNOSIS — J82 Pulmonary eosinophilia, not elsewhere classified: Secondary | ICD-10-CM | POA: Diagnosis not present

## 2017-09-05 DIAGNOSIS — J8283 Eosinophilic asthma: Secondary | ICD-10-CM

## 2017-09-05 NOTE — Telephone Encounter (Signed)
TS can we close this encounter?

## 2017-09-05 NOTE — Telephone Encounter (Signed)
Yes, duplicate encounter. Del was recorded on original encounter. Nothing further needed.

## 2017-09-06 MED ORDER — MEPOLIZUMAB 100 MG ~~LOC~~ SOLR
100.0000 mg | Freq: Once | SUBCUTANEOUS | Status: AC
  Administered 2017-09-05: 100 mg via SUBCUTANEOUS

## 2017-09-07 ENCOUNTER — Encounter (HOSPITAL_COMMUNITY): Payer: Medicare Other

## 2017-09-14 ENCOUNTER — Other Ambulatory Visit: Payer: Self-pay | Admitting: Cardiovascular Disease

## 2017-09-14 ENCOUNTER — Ambulatory Visit (HOSPITAL_COMMUNITY): Payer: Medicare Other

## 2017-09-22 ENCOUNTER — Ambulatory Visit (INDEPENDENT_AMBULATORY_CARE_PROVIDER_SITE_OTHER)
Admission: RE | Admit: 2017-09-22 | Discharge: 2017-09-22 | Disposition: A | Discharge status: Home / Self Care | Payer: Medicare Other | Source: Ambulatory Visit | Attending: Pulmonary Disease | Admitting: Pulmonary Disease

## 2017-09-22 ENCOUNTER — Ambulatory Visit: Payer: Medicare Other | Admitting: Pulmonary Disease

## 2017-09-22 ENCOUNTER — Encounter: Payer: Self-pay | Admitting: Pulmonary Disease

## 2017-09-22 VITALS — BP 126/76 | HR 84 | Ht 62.0 in | Wt 152.2 lb

## 2017-09-22 DIAGNOSIS — J4541 Moderate persistent asthma with (acute) exacerbation: Secondary | ICD-10-CM | POA: Diagnosis not present

## 2017-09-22 DIAGNOSIS — K219 Gastro-esophageal reflux disease without esophagitis: Secondary | ICD-10-CM | POA: Diagnosis not present

## 2017-09-22 LAB — POCT EXHALED NITRIC OXIDE: FeNO level (ppb): 113

## 2017-09-22 MED ORDER — AZITHROMYCIN 250 MG PO TABS: ORAL_TABLET | ORAL | 0 refills | Status: DC

## 2017-09-22 MED ORDER — PREDNISONE 10 MG PO TABS: ORAL_TABLET | ORAL | 0 refills | Status: DC

## 2017-09-22 NOTE — Assessment & Plan Note (Signed)
PCP and patient are concerned the patient is on omeprazole daily.  Patient requesting to switch to Zantac.  Discussed with patient she needs to take Zantac daily I am okay with her doing this switch.  If cough or asthma symptoms worsen then they need to go back on PPI.

## 2017-09-22 NOTE — Progress Notes (Signed)
Discussed results with patient in office.  Nothing further is needed.   Wyn Quaker FNP

## 2017-09-22 NOTE — Progress Notes (Signed)
@Patient  ID: [name], female    DOB: 06-04-1945, 72 y.o.   MRN: [id number]  Chief Complaint  Patient presents with  . Acute Visit    wheezing, wet cough congestion     Referring provider: [name], MD  HPI: 72 year old with history of persistent asthma, multiple sclerosis on  complains of increasing dyspnea for the past 1 week associated with yellow mucus, wheezing, chest tightness.  She denies any nebulizer but has not received them yet.  Paperwork is underway for initiation of nucala but has not been started yet.   She has significant history of GERD with esophageal spasm, atypical chest pain.  She follows at [facility] for this.  She is taking Protonix alternating with Zyrtec.   09/22/17 Acute  Pleasant 72 year old seen office today.  Patient reporting 5 days of wheezing, lots of mucus, yellow mucus, shortness of breath.  Patient reports adherence to [facility], controller therapies, and not needing rescue inhaler.  Patient was confused about when to use rescue inhaler.  Patient is adherent to her Nucala.   Allergies  Allergen Reactions  . Crestor [Rosuvastatin] Other (See Comments)    Joint pain   . Iodinated Diagnostic Agents Other (See Comments)    Sneezing and itchy throat; dye was Isovue 300  . Vicodin [Hydrocodone-Acetaminophen]     Made pass-out one time and is okay taking now  . Isovue [Iopamidol]     Pt had sneezing and itching of her throat and soft palate.  Dr Alvester Chou checked pt.  She will need premeds in the future.  J Bohm    Immunization History  Administered Date(s) Administered  . Influenza, High Dose Seasonal PF 12/22/2013, 12/14/2015, 12/14/2016  . Influenza,inj,Quad PF,6+ Mos 11/27/2014  . Pneumococcal Polysaccharide-23 10/26/2013  . Pneumococcal-Unspecified 10/26/2013  . Tdap 06/14/2011    Past Medical History:  Diagnosis Date  . Anemia    past history-many yrs ago  . Anginal pain ([place])    being evaluated by Dr. Tyrone Sage, arm pain,"bad  indigestion" -no heart related findings as of yet  . Arthritis    hip. back pain  . Complication of anesthesia    s/p Hysterectomy "vagal response "heart stopped" -did not require shocking.  . Coronary artery disease   . Dry eyes   . Esophageal spasm   . GERD (gastroesophageal reflux disease)   . MS (multiple sclerosis) ([place])    stable-sees [name] every 6 months    Tobacco History: Social History   Tobacco Use  Smoking Status Never Smoker  Smokeless Tobacco Never Used   Counseling given: Not Answered Continue not smoking.  Outpatient Encounter Medications as of 09/22/2017  Medication Sig  . albuterol (PROVENTIL HFA;VENTOLIN HFA) 108 (90 Base) MCG/ACT inhaler Inhale 1-2 puffs into the lungs every 6 (six) hours as needed for wheezing or shortness of breath.  [name] aspirin EC 81 MG tablet Take 1 tablet (81 mg total) by mouth daily.  . calcium-vitamin D (OSCAL WITH D) 500-200 MG-UNIT tablet Take 1 tablet by mouth daily.  . cetirizine (ZYRTEC ALLERGY) 10 MG tablet Take 1 tablet (10 mg total) by mouth daily.  . Cholecalciferol (VITAMIN D PO) Take 1 tablet by mouth daily.  [name] CINNAMON PO Take 1 tablet by mouth daily.  . diazepam (VALIUM) 2 MG tablet   . isosorbide mononitrate (IMDUR) 60 MG 24 hr tablet TAKE 1 TABLET ONCE DAILY.  [name] levothyroxine (SYNTHROID, LEVOTHROID) 25 MCG tablet Take 25 mcg daily before breakfast by mouth.  . losartan (COZAAR)  50 MG tablet   . mometasone-formoterol (DULERA) 200-5 MCG/ACT AERO Inhale 2 puffs into the lungs 2 (two) times daily.  . montelukast (SINGULAIR) 10 MG tablet Take 1 tablet (10 mg total) by mouth at bedtime.  . Multiple Vitamin (MULTIVITAMIN WITH MINERALS) TABS tablet Take 1 tablet by mouth daily.  [name] NITROSTAT 0.4 MG SL tablet PLACE 1 TAB UNDER THE TONGUE AS NEEDED FOR CHEST PAIN MAY REPEAT EVERY 5 MINUTES.  [name] nystatin (MYCOSTATIN) 100000 UNIT/ML suspension Swish and swallow 37mL four times daily x5-7days  . ocrelizumab (OCREVUS) 300 MG/10ML  injection Inject 600 mg into the vein every 6 (six) months.  [name] omeprazole (PRILOSEC OTC) 20 MG tablet Take 20 mg by mouth daily.  . pantoprazole (PROTONIX) 20 MG tablet TAKE 1 TABLET BY MOUTH TWICE DAILY.  [name] PARoxetine (PAXIL) 10 MG tablet Take 5 mg by mouth daily.   [name] Respiratory Therapy Supplies (FLUTTER) DEVI Use as directed  . VYTORIN 10-20 MG per tablet Take 1 tablet by mouth daily.   . [DISCONTINUED] predniSONE (DELTASONE) 10 MG tablet 40mg  x3 day, then 30mg  x3 day, then 20mg x3 day, then 10mg  x3 day, then 5mg  x3 day , then STOP  . azithromycin (ZITHROMAX) 250 MG tablet 500mg  (two tablets) today, then 250mg  (1 tablet) for the next 4 days  . predniSONE (DELTASONE) 10 MG tablet 4 tabs for 2 days, then 3 tabs for 2 days, 2 tabs for 2 days, then 1 tab for 2 days, then stop   Facility-Administered Encounter Medications as of 09/22/2017  Medication  . Mepolizumab SOLR 100 mg  . Mepolizumab SOLR 100 mg     Review of Systems  Constitutional:  +fatigue     No  weight loss, night sweats,  fevers, chills HEENT: +post nasal drip  No headaches,  Difficulty swallowing,  Tooth/dental problems, or  Sore throat, No sneezing, itching, ear ache CV: No chest pain,  orthopnea, PND, swelling in lower extremities, anasarca, dizziness, palpitations, syncope  GI: No heartburn, indigestion, abdominal pain, nausea, vomiting, diarrhea, change in bowel habits, loss of appetite, bloody stools Resp: +yellow mucous with cough, sob with exertion   No shortness of breath at rest.  No coughing up of blood.  No change in color of mucus.  No wheezing.  No chest wall deformity Skin: no rash, lesions, no skin changes. GU: no dysuria, change in color of urine, no urgency or frequency.  No flank pain, no hematuria  MS:  No joint pain or swelling.  No decreased range of motion.  No back pain. Psych: No change in mood or affect. No depression or anxiety.  No memory loss.   Physical Exam  BP 126/76 (BP Location: Left Arm,  Cuff Size: Normal)   Pulse 84   Ht 5\' 2"  (1.575 m)   Wt 152 lb 3.2 oz (69 kg)   SpO2 96%   BMI 27.84 kg/m   Wt Readings from Last 3 Encounters:  09/22/17 152 lb 3.2 oz (69 kg)  08/23/17 157 lb (71.2 kg)  08/08/17 155 lb (70.3 kg)    GEN: A/Ox3; pleasant , NAD, well nourished, anxious     HEENT:  [place]/AT,  EACs-clear, TMs-wnl, NOSE-clear, THROAT- +post nasal drip Sinus - non tender   NECK:  Supple w/ fair ROM; no JVD; no lymphadenopathy.    RESP: +expiratory wheezes, good air movement  no accessory muscle use, no dullness to percussion  CARD:  RRR, no m/r/g, no peripheral edema, pulses intact, no cyanosis or clubbing.  GI:   Soft & nt; nml bowel sounds; no organomegaly or masses detected.   Musco: Warm bil, no deformities or joint swelling noted.   Neuro: alert, no focal deficits noted.    Skin: Warm, no lesions or rashes    Lab Results:  CBC    Component Value Date/Time   WBC 5.8 08/08/2017 1132   RBC 4.69 08/08/2017 1132   HGB 14.4 08/08/2017 1132   HCT 43.0 08/08/2017 1132   PLT 250.0 08/08/2017 1132   MCV 91.7 08/08/2017 1132   MCH 30.9 03/19/2017 1545   MCHC 33.5 08/08/2017 1132   RDW 13.2 08/08/2017 1132   LYMPHSABS 1.5 08/08/2017 1132   MONOABS 0.8 08/08/2017 1132   EOSABS 0.0 08/08/2017 1132   BASOSABS 0.0 08/08/2017 1132    BMET    Component Value Date/Time   NA 140 03/19/2017 1545   K 3.8 03/19/2017 1545   CL 107 03/19/2017 1545   CO2 23 03/19/2017 1545   GLUCOSE 101 (H) 03/19/2017 1545   BUN 10 03/19/2017 1545   CREATININE 0.72 03/19/2017 1545   CALCIUM 8.9 03/19/2017 1545   GFRNONAA >60 03/19/2017 1545   GFRAA >60 03/19/2017 1545    BNP No results found for: BNP  ProBNP No results found for: PROBNP  Imaging: No results found.   Assessment & Plan:   Asthma exacerbation  Chest x-ray today, prednisone taper, Z-Pak.  Keep her follow-up with [name] next month.  Keep appointment for Nucala.  Potentially consider switching  patient to [place] if exacerbations continue on Nucala.  Discussed case with [name].  Okay to stop omeprazole.  We can start Zantac daily.  Asthma exacerbation Chest Xray  Prednisone 10mg  tablet  >>>4 tabs for 2 days, then 3 tabs for 2 days, 2 tabs for 2 days, then 1 tab for 2 days, then stop >>> Take with food Z-Pak  250mg  tablet  >>> Take 2 tablets today, take 1 tablet daily for the next 4 days after >>> Take with food  Keep follow-up with [name] for next month  Keep appointment for Nucala  GERD (gastroesophageal reflux disease) PCP and patient are concerned the patient is on omeprazole daily.  Patient requesting to switch to Zantac.  Discussed with patient she needs to take Zantac daily I am okay with her doing this switch.  If cough or asthma symptoms worsen then they need to go back on PPI.     Lauraine Rinne, NP 09/22/2017

## 2017-09-22 NOTE — Patient Instructions (Addendum)
Chest Xray  Prednisone 10mg  tablet  >>>4 tabs for 2 days, then 3 tabs for 2 days, 2 tabs for 2 days, then 1 tab for 2 days, then stop >>> Take with food Z-Pak  250mg  tablet  >>> Take 2 tablets today, take 1 tablet daily for the next 4 days after >>> Take with food  Keep follow-up with [name] for next month  Keep appointment for [facility]  If symptoms do not improve please follow-up with our office.  If breathing or symptoms worsen our office is closed please present to the emergency room.  Please contact the office if your symptoms worsen or you have concerns that you are not improving.   Thank you for choosing [facility] for your healthcare, and for allowing [place] to partner with you on your healthcare journey. I am thankful to be able to provide care to you today.   Wyn Quaker FNP-C

## 2017-09-22 NOTE — Assessment & Plan Note (Signed)
Chest Xray  Prednisone 10mg  tablet  >>>4 tabs for 2 days, then 3 tabs for 2 days, 2 tabs for 2 days, then 1 tab for 2 days, then stop >>> Take with food Z-Pak  250mg  tablet  >>> Take 2 tablets today, take 1 tablet daily for the next 4 days after >>> Take with food  Keep follow-up with [name] for next month  Keep appointment for [facility]

## 2017-09-25 NOTE — Progress Notes (Signed)
No acute changes seen on exam.  Mild scarring is noted in the bases bilaterally.  Continue with current plan of care no changes based off of this chest x-ray.  Keep follow-up with [name].  Wyn Quaker, FNP

## 2017-09-26 ENCOUNTER — Telehealth: Payer: Self-pay | Admitting: Adult Health

## 2017-09-27 NOTE — Telephone Encounter (Signed)
1 Vial Order Date: 09/27/17 Shipping Date: 10/01/17 for del on Mon.. (10/02/17)

## 2017-09-29 NOTE — Telephone Encounter (Signed)
Yes, waiting for med to come in, leaving encounter open to record when med comes in.

## 2017-09-29 NOTE — Telephone Encounter (Signed)
Tammy, please advise if anything else is needed. Thanks!

## 2017-10-02 NOTE — Telephone Encounter (Signed)
1 vial Arrival Date:10/02/17 Lot #: [id number]

## 2017-10-03 ENCOUNTER — Ambulatory Visit (INDEPENDENT_AMBULATORY_CARE_PROVIDER_SITE_OTHER): Payer: Medicare Other

## 2017-10-03 DIAGNOSIS — J4540 Moderate persistent asthma, uncomplicated: Secondary | ICD-10-CM

## 2017-10-03 MED ORDER — MEPOLIZUMAB 100 MG ~~LOC~~ SOLR
100.0000 mg | Freq: Once | SUBCUTANEOUS | Status: AC
  Administered 2017-10-03: 100 mg via SUBCUTANEOUS

## 2017-10-09 ENCOUNTER — Other Ambulatory Visit: Payer: Self-pay | Admitting: Adult Health

## 2017-10-10 ENCOUNTER — Other Ambulatory Visit: Payer: Self-pay | Admitting: Internal Medicine

## 2017-10-16 ENCOUNTER — Encounter: Payer: Self-pay | Admitting: Internal Medicine

## 2017-10-16 ENCOUNTER — Ambulatory Visit: Payer: Medicare Other | Admitting: Internal Medicine

## 2017-10-16 VITALS — BP 128/76 | HR 88 | Ht 62.0 in | Wt 153.6 lb

## 2017-10-16 DIAGNOSIS — J4550 Severe persistent asthma, uncomplicated: Secondary | ICD-10-CM | POA: Diagnosis not present

## 2017-10-16 DIAGNOSIS — D721 Eosinophilia, unspecified: Secondary | ICD-10-CM

## 2017-10-16 LAB — NITRIC OXIDE: NITRIC OXIDE: 237

## 2017-10-16 NOTE — Addendum Note (Signed)
Addended by: Lorretta Harp on: 10/16/2017 11:00 AM   Modules accepted: Orders

## 2017-10-16 NOTE — Patient Instructions (Addendum)
ICD-10-CM   1. Severe persistent asthma, unspecified whether complicated R91.63   2. Eosinophilia D72.1     Currently asthma is without flare up However, not convinced nucala is working for you   - in fact nitric oxide levels are higher   Plan Continue nucala for now but recommend change to dupulimab high dose every 2 weeks  - take product information Continue dulera and singulair scheduled  + albuterol as needed  Followup 6-8 weeks; ACQ and feno at followup

## 2017-10-16 NOTE — Progress Notes (Signed)
Subjective:     Patient ID: [name], female   DOB: 02/23/1946, 72 y.o.   MRN: [id number]  HPI  OV 11/30/2016  Chief Complaint  Patient presents with  . Follow-up    Pt here after acute visit with TP on 8.31.2018. Pt states she is feeling improved but not back to baseline. Pt states she is still having prod cough with pale yellow mucus, still some SOB. Pt denies CP/tightness, f/c/s.      72 year old female with moderate persistent asthma. Last seen 11/25/2013 by her practitioner. Given prednisone and Z-Pak. Currently she is improved. Pulmonary function test yesterday shows normalized FEV1 and DLCO. Nitric oxide was high 3 weeks ago. She is here with [name]. Her son has graduated from anesthesia residency at the [employer]. She is worried about repeated it takes of steroids. She admitted to noncompliance with Symbicort and Singulair up until May 2018. She is also worried about her monoclonal antibody infusion for multiple sclerosis which she gets every 6 months for the last year or 2. She says after that she gets extremely fatigued and might require a prednisone burst. Occasionally she is not a respiratory infection. However she's been advised by her neurologist to continue with these injections in order to support her multiple sclerosis. There are no other new issues. She had a 7 mm lung nodule that resolved a year ago  72 year old female never smoker followed for moderate persistent asthma and lung nodule. MS on Infusion -OCREVUS every 6 months  Son is an anesthesiologist   01/13/2017 Follow up : Asthma  Pt returns for follow up for Asthma . She was seen on 12/27/16 for asthma bronchitic flare with 3 week hx of productive cough with thick yellow mucus and wheezing . Her FENO testing was elevated . She was treated with Zpack and Prednisone taper. Says she did get better with less cough, congestion and wheezing . She is concerned because she gets intermittent wheezing  especially in am. Goes away after she uses Symbicort.  No fever, discolored mucus, chest pain, orthopnea,.  She remains on Symbicort, Singulair and Zyrtec. Says she is compliant .  She says she and family are concerned regarding steroids use. We went over steroid use and potential side effects and goal is to limit use as much as possible . Last ov Nucala was ordered and is under approval process . This was added to help with improved asthma control .  She is concerned for adrenal insufficiency , discussed that it can occur with prolonged steroid use. She has been on three 1 week steroid tapers over last 4 months . Suggested she discuss this with PCP to have additional testing or referral to endocrinology if indicated.  FENO testing today is slightly improved at 150 .     Acute 01/27/17 72 year old with history of persistent asthma, multiple sclerosis on  complains of increasing dyspnea for the past 1 week associated with yellow mucus, wheezing, chest tightness.  She denies any nebulizer but has not received them yet.  Paperwork is underway for initiation of nucala but has not been started yet.   She has significant history of GERD with esophageal spasm, atypical chest pain.  She follows at [facility] for this.  She is taking Protonix alternating with Zyrtec.     OV 02/08/2017  Chief Complaint  Patient presents with  . Follow-up    Pt last seen by PM on 11.2.2018 for an acute visit. Pt states she started to  imporve but now is feeling like she is catching a cold again. Pt c/o prod cough with yellow mucus. Pt deniee SOB, CP/tihgtness and f/c/s.      72 year old female with eosinophilic and allergic asthma.  Presents for follow-up.  Most recently seen January 27, 2017 by my colleague for asthma exacerbation.  She is on her last day of prednisone today.  She feels improved.  Her nitric oxide has been the lowest it has been in 2018 which is 115 ppb but still significantly elevated.  She feels she is  catching a cold although she feels overall better.  She is on Symbicort schedule and Singulair scheduled.  She is not on any inhaled anticholinergic.  She is awaiting her interleukin-5 receptor antibody treatment.  She is worried about taking this because of cross interference with the other monoclonal antibody that she takes for her multiple sclerosis.  We had a discussion on this.  I did tell her that the cross-reactive and side effect profile is unknown and the general unpredictability can be higher.  Despite this we thought that the overall risk benefit ratio is in favor of her taking her interleukin-5 receptor antibody for asthma.   TEST  CT chest 01/2016 7 mm nodule resolved, stable other nodules , improved tree in bud nodularity  09/2016 IgE 4, neg RAST  09/2016 Eosinophils 300-400  Feno >.150 10/2016 >187 12/27/16  PFT 11/2016 >nml  FEV 1 , no restriction or obstruction , nml DLCO   OV 04/19/2017  Chief Complaint  Patient presents with  . Follow-up    has had 2 ED visit in December 2018 with asthma/bronchitis,no admission,feels like breathing at this time is better,she can tell improvement   Follow-up allergic asthma with eosinophilic asthma but normal IgE  She is here with [name] who runs a daycare for after school kids at [facility].  Her son is no longer at the [employer] but is now working in [place].  She tells me currently her asthma is stable.  She and her daughter are very categorical that the whole asthma started after she started taking monoclonal antibody infusions for her multiple sclerosis.   there is an infusion administered over 6 hours every 6 months.  She first started this infusion around November 2017.  She tells me that she has infusion reactions that start within a day or so of receiving the infusion.  These infusion reactions are characterized by body ache and fever and aggravation of past medical issues such as the meniscus.  She also has throat  irritation and bronchospasm.  Then some 2 weeks later like clockwork she gets acute bronchitis which is then treated as an asthma exacerbation.  She also has significant fatigue.  She says it takes her months to recover from this and she has a normal good 5 months before the cycle is repeated again.  Most recently the infusion was in November 2018 and then she ended up in the ER for asthma exacerbation in December 2018.  She was supposed to start interleukin-5 receptor antibody for asthma that was poorly controlled and because of persistent eosinophilia and high nitric oxide but given the cycle she and her daughter have decided against Nucala especially because she is convinced it is the Bowler for multiple sclerosis that is causing this problem.  She is going to skip her next multiple sclerosis infusion in May 2019.  At this point in time she is compliant with her Symbicort.  Med review  shows coreg   OV 05/10/2017  Chief Complaint  Patient presents with  . Acute Visit    Pt has complaints of coughing with thick yellow mucus and occ. dark brown mucus along with wheezing and hoarseness. Pt denies any fever.   70 -year-old female with asthma eosinophilic and moderate persistent on Symbicort.  She presents for an acute visit.  Since seeing me 3 weeks ago she had gradual deterioration in symptoms.  She is having significant nocturnal symptoms.  Her friends are telling her that her voice is hoarse.  She says she is compliant with her Symbicort and Singulair.  Her interleukin-5 receptor antibody is on hold because she is worried about interactions with the monoclonal antibody for multiple sclerosis still in her system and the washout of that is expected only June 2019.  Currently her asthma control questionnaire shows a score of 3.6 showing significant symptoms.  She is waking up a few times at night because of asthma symptoms and when she wakes up she has moderate symptoms.  She is moderately limited in her  activities because of asthma.  She is short of breath quite a bit and wheezing all the time although she hold off on using her albuterol for rescue.  Med review shows that she is on nonspecific beta blocker carvedilol.  There is no fever or chills.    OV 06/08/2017  Chief Complaint  Patient presents with  . Follow-up    Pt has had problems with side effects from the spiriva due to eye problems and dry mouth.  Breo and spiriva had helped with the wheezing but due to side effects, meds were stopped x2 days. States she had a coughing fit with the meds as well. Pt has c/o cough with yellow mucus, SOB, and is back to wheezing.   73 year old female with asthma eosinophilic and moderate persistent.  At last visit she was in acute visit.  I added Spiriva to her regimen.  Switch to Symbicort 2 higher dose Brio.  I asked her to continue her Singulair.  She still was not interested in her biologic therapy given her issues doing biologic therapy with multiple sclerosis.  We also had her talk to her physician and get her nonspecific beta-blocker carvedilol switched out.  She did all this.  However she tells me that Spiriva is causing dry mouth and she stopped it like a week ago.  For some unclear reason she also stopped the Brio for a few days a week ago and then again for a few days earlier this week and then starting yesterday or like 2 days ago started having worsening asthma symptoms.  She is now reporting significant cough.  When she wakes up she has mild symptoms when she is active she is very slightly limited because of her asthma.  She is short of breath a moderate amount because of the asthma and is wheezing a moderate amount because of the asthma and in the last 2 days most of the time and she is now using nebulizer for rescue 2 times daily.  Average asthma control question as 2.2.  Her exact nitric oxide test continues to be elevated at 156 as always.  She is now accepting that she will have to go through  biologic therapy.  Her daughter status with her.  The questioning alternative etiologies.  She tells me that she might have mold in the house.  Review of the labs show she had normal allergy blood test in July 2018  and a clear chest x-ray in December 2018 [CT chest in November showed tree-in-bud in the right upper lobe 1 month earlier] IgE was normal back in July 2018.  Aspergillus panel never checked.   07/06/2017  Pt. Was seen for an acute flare 06/08/2017 after stopping her Breo. At that visit, Dr. Golden Pop plan was as follows: Plan Retest for ABPA             - check IgE, aspergillus antibody blood test , do CXR Take prednisone 40 mg daily x 2 days, then 20mg  daily x 2 days, then 10mg  daily x 2 days, then 5mg  daily x 2 days and stop Start dulera high dose - you need to be on inhaled steroids No spiriva for you Continue singulair Glad coreg got changed out Start Nucala starting next week FENO at this visit was 156 ppb  Followup 4-6 weeks or sooner if needed with myself or APP             - feno and ACQ at follow up  Pt. Presents today . She states she completed he prednisone taper . She has been compliant with her Dulera, Singulair and Zyrtec. She has not had to use her rescue inhaler. She is not coughing. She states maybe once daily. Her cough is productive for yellow secretions on the occasion she does cough. She has had 1 Nucala injection.She did have some fatigue 1 week after the injection. It lasted x 1 day. She did have some mild arm tenderness one week after the injection. She does feel she is better than she was last month.She denies fever, chest pain, orthopnea or hemoptysis.    OV 08/08/2017  Chief Complaint  Patient presents with  . Follow-up    Pt states she is coughing up a lot of yellow mucus in the mornings. Other than the cough, pt states she has been doing good.    Rena Hunke presents for follow-up.  She has severe eosinophilic asthma that is poorly controlled  with persistently significant high elevated nitric oxide levels  I personally saw her in mid March 2019.  At that time I recommended starting nucala.  She is now had 2 doses 4 weeks apart of that same injection.  Today is the third dose.  She says for the last few weeks she is noticing increased sputum production and loosening of the sputum and a yellow color in the sputum.  She feels that it is because the injection is working.  She does not feel it is the asthma exacerbation.  Her exam nitric oxide continues to be very high at 162 today.  She says she is compliant with her Dulera.  In terms of her multiple sclerosis she is coming off Ocrivis and going to sart on TYSABRI.  the care for this is being coordinated by Dr. Gorden Harms.  And apparently the neurologist was wondering about she says that between the last visit in this current visit she got her immunoglobulin checked by him and all these levels were low the correlation of that and asthma.  With in our labs we do notice that the IgE levels are low.  Currently she is not running any fever or chills or orthopnea or active wheezing   OV 10/16/2017  Chief Complaint  Patient presents with  . Follow-up    Pt states she has been doing well since last OV with Wyn Quaker, NP but states she is still coughing up phlegm.    Florena Kozma presents  for follow-up.  She has severe persistent asthma with eosinophila asthma that is poorly controlled with persistently significant high elevated nitric oxide levels usually > 150. On Nucala since mid-march 2019   Since I saw her last 2 months ago she's had an exacerbation requiring prednisone. She continues on interleukin-5 receptor antibody subcutaneous injections every 4 weeks since mid March 2019. Currently she feels better than before asthma control questionnaire is1.2 and is on the better score she's had over time.However nitric oxide test is significantly elevated and worse and now in the 200s. She is very puzzled  by this. She denies anyone infestation. Most recent eosinophil count to show improvement. But she still overall very symptomatic and still requiring prednisone. We discussed the switch to another biologic dupulimab and she is open to this idea   Results for JRUE, YAMBAO (MRN [id number]) as of 04/19/2017 10:02  Ref. Range 10/07/2016 12:29 11/10/2016 15:22 12/27/2016 17:04 01/27/2017 15:29 02/08/2017 10:30 05/10/2017  06/08/2017 Start nucala 08/08/2017  10/16/2017    Nitric Oxide Unknown 180 150 187 146 115 153 156 162 237  ACQ score       3.6   2.2  1.2   Results for TIANAH, LONARDO (MRN [id number]) as of 10/16/2017 10:02  Ref. Range 05/10/2009 04:32 10/07/2016 13:11 10/07/2016 20:51 03/19/2017 15:45 08/08/2017 11:32  Eosinophils Absolute Latest Ref Range: 0.0 - 0.7 K/uL 0.1 0.4 0.3 0.3 0.0  Results for CAMARYN, LUMBERT (MRN [id number]) as of 05/10/2017 12:14  Ref. Range 10/07/2016 13:11  IgEcoming (Immunoglobulin E), Serum Latest Ref Range: <115 kU/L 4       has a past medical history of Anemia, Anginal pain (HCC), Arthritis, Complication of anesthesia, Coronary artery disease, Dry eyes, Esophageal spasm, GERD (gastroesophageal reflux disease), and MS (multiple sclerosis) ([place]).   reports that she has never smoked. She has never used smokeless tobacco.  Past Surgical History:  Procedure Laterality Date  . ABDOMINAL HYSTERECTOMY    . BLEPHAROPLASTY Bilateral   . CARDIAC CATHETERIZATION  10/04/06   MINOR CAD,SINGLE VESSEL INVOLVING THE CIRCUMFLEX. 20 TO 30% PROXIMALLY AND 10 TO 20% IN THE MIDDLE SEGMENT.MILD MUSCLE BRIDGING, MID LAD.NORMAL RCA.NORMAL LV FUNCTION.NORMAL MITRAL AND AORTIC VALVE.NORMAL APPEARING AORTA,THORACIC AND ABDOMINAL.NORMAL RENAL ARTERIES.  [name] CARDIOLOGY NUCLEAR MED STUDY  06/22/12   NL LV FUNCTION,EF 68%,NL WALL MOTION.  [name] CAROTID DUPLEX  07/02/11   GEX:BMWU SOFT PLAQUE NOTED DISTAL CCA AND ORGIN AND PROXIMAL ICA,LEFT>RIGHT.NO ICA STENOSIS. VERTEBRAL ARTERY FLOW IS ANTEGRADE.  [name]  CESAREAN SECTION     x2   . COLONOSCOPY WITH PROPOFOL N/A 12/11/2014   Procedure: COLONOSCOPY WITH PROPOFOL;  Surgeon: Ronald Lobo, MD;  Location: [facility];  Service: Endoscopy;  Laterality: N/A;  . KNEE ARTHROSCOPY Left    scope  . PARATHYROIDECTOMY     partial-many years ago  . thumb surgery Bilateral    built up and bone removal  . TONSILLECTOMY    . TRANSTHORACIC ECHOCARDIOGRAM  07/02/11   LV CAVITY SIZE IS NORMAL. SYSTOLIC FUNCTION WAS NORMAL.EF=55% TO 60%.INCREASED RELATIVE CONTRIBUTION OF ATRIAL CONTRACTION TO VENTRICULAR FILLING;MAYBE DUE TO HYPOVOLEMIA. AV=MILD REGURG.    Allergies  Allergen Reactions  . Crestor [Rosuvastatin] Other (See Comments)    Joint pain   . Iodinated Diagnostic Agents Other (See Comments)    Sneezing and itchy throat; dye was Isovue 300  . Vicodin [Hydrocodone-Acetaminophen]     Made pass-out one time and is okay taking now  . Isovue [Iopamidol]     Pt had sneezing  and itching of her throat and soft palate.  Dr Alvester Chou checked pt.  She will need premeds in the future.  J Bohm    Immunization History  Administered Date(s) Administered  . Influenza, High Dose Seasonal PF 12/22/2013, 12/14/2015, 12/14/2016  . Influenza,inj,Quad PF,6+ Mos 11/27/2014  . Pneumococcal Polysaccharide-23 10/26/2013  . Pneumococcal-Unspecified 10/26/2013  . Tdap 06/14/2011    Family History  Problem Relation Age of Onset  . Heart disease Mother   . Asthma Father   . Bladder Cancer Father   . Heart failure Father   . Hyperlipidemia Brother      Current Outpatient Medications:  .  albuterol (PROVENTIL HFA;VENTOLIN HFA) 108 (90 Base) MCG/ACT inhaler, Inhale 1-2 puffs into the lungs every 6 (six) hours as needed for wheezing or shortness of breath., Disp: 1 Inhaler, Rfl: 5 .  amLODipine (NORVASC) 5 MG tablet, , Disp: , Rfl:  .  aspirin EC 81 MG tablet, Take 1 tablet (81 mg total) by mouth daily., Disp: 90 tablet, Rfl: 3 .  calcium-vitamin D (OSCAL WITH D)  500-200 MG-UNIT tablet, Take 1 tablet by mouth daily., Disp: , Rfl:  .  cetirizine (ZYRTEC ALLERGY) 10 MG tablet, Take 1 tablet (10 mg total) by mouth daily., Disp: 30 tablet, Rfl: 5 .  Cholecalciferol (VITAMIN D PO), Take 1 tablet by mouth daily., Disp: , Rfl:  .  CINNAMON PO, Take 1 tablet by mouth daily., Disp: , Rfl:  .  diazepam (VALIUM) 2 MG tablet, , Disp: , Rfl:  .  isosorbide mononitrate (IMDUR) 60 MG 24 hr tablet, TAKE 1 TABLET ONCE DAILY., Disp: 30 tablet, Rfl: 11 .  levothyroxine (SYNTHROID, LEVOTHROID) 25 MCG tablet, Take 25 mcg daily before breakfast by mouth., Disp: , Rfl:  .  losartan (COZAAR) 50 MG tablet, , Disp: , Rfl:  .  mometasone-formoterol (DULERA) 200-5 MCG/ACT AERO, Inhale 2 puffs into the lungs 2 (two) times daily., Disp: 1 Inhaler, Rfl: 5 .  montelukast (SINGULAIR) 10 MG tablet, TAKE ONE TABLET AT BEDTIME., Disp: 30 tablet, Rfl: 0 .  Multiple Vitamin (MULTIVITAMIN WITH MINERALS) TABS tablet, Take 1 tablet by mouth daily., Disp: , Rfl:  .  NUCALA 100 MG SOLR, , Disp: , Rfl:  .  ocrelizumab (OCREVUS) 300 MG/10ML injection, Inject 600 mg into the vein every 6 (six) months., Disp: , Rfl:  .  pantoprazole (PROTONIX) 20 MG tablet, TAKE 1 TABLET BY MOUTH TWICE DAILY., Disp: 60 tablet, Rfl: 0 .  PARoxetine (PAXIL) 10 MG tablet, Take 5 mg by mouth daily. , Disp: , Rfl:  .  Respiratory Therapy Supplies (FLUTTER) DEVI, Use as directed, Disp: 1 each, Rfl: 0 .  VYTORIN 10-20 MG per tablet, Take 1 tablet by mouth daily. , Disp: , Rfl:  .  NITROSTAT 0.4 MG SL tablet, PLACE 1 TAB UNDER THE TONGUE AS NEEDED FOR CHEST PAIN MAY REPEAT EVERY 5 MINUTES. (Patient not taking: Reported on 10/16/2017), Disp: 25 tablet, Rfl: 3  Current Facility-Administered Medications:  [name]  Mepolizumab SOLR 100 mg, 100 mg, Subcutaneous, Q28 days, Brand Males, MD, 100 mg at 06/14/17 0917 .  Mepolizumab SOLR 100 mg, 100 mg, Subcutaneous, Q28 days, Brand Males, MD, 100 mg at 07/12/17 3818   Review  of Systems     Objective:   Physical Exam  Constitutional: She is oriented to person, place, and time. She appears well-developed and well-nourished. No distress.  HENT:  Head: Normocephalic and atraumatic.  Right Ear: External ear normal.  Left Ear: External ear  normal.  Mouth/Throat: Oropharynx is clear and moist. No oropharyngeal exudate.  No thrush  Eyes: Pupils are equal, round, and reactive to light. Conjunctivae and EOM are normal. Right eye exhibits no discharge. Left eye exhibits no discharge. No scleral icterus.  Neck: Normal range of motion. Neck supple. No JVD present. No tracheal deviation present. No thyromegaly present.  Cardiovascular: Normal rate, regular rhythm, normal heart sounds and intact distal pulses. Exam reveals no gallop and no friction rub.  No murmur heard. Pulmonary/Chest: Effort normal and breath sounds normal. No respiratory distress. She has no wheezes. She has no rales. She exhibits no tenderness.  Abdominal: Soft. Bowel sounds are normal. She exhibits no distension and no mass. There is no tenderness. There is no rebound and no guarding.  Musculoskeletal: Normal range of motion. She exhibits no edema or tenderness.  Lymphadenopathy:    She has no cervical adenopathy.  Neurological: She is alert and oriented to person, place, and time. She has normal reflexes. No cranial nerve deficit. She exhibits normal muscle tone. Coordination normal.  Skin: Skin is warm and dry. No rash noted. She is not diaphoretic. No erythema. No pallor.  Psychiatric: She has a normal mood and affect. Her behavior is normal. Judgment and thought content normal.  Vitals reviewed.  Vitals:   10/16/17 0956  BP: 128/76  Pulse: 88  SpO2: 96%    Estimated body mass index is 27.84 kg/m as calculated from the following:   Height as of 09/22/17: 5\' 2"  (1.575 m).   Weight as of 09/22/17: 152 lb 3.2 oz (69 kg).     Assessment:       ICD-10-CM   1. Severe persistent asthma,  unspecified whether complicated P10.31   2. Eosinophilia D72.1        Plan:      Currently asthma is without flare up However, not convinced nucala is working for you   - in fact nitric oxide levels are higher   Plan Continue nucala for now but recommend change to dupulimab high dose every 2 weeks  - take product information Continue dulera and singulair scheduled  + albuterol as needed  Followup 6-8 weeks; ACQ and feno at followup    Dr. Brand Males, M.D., [facility]P Pulmonary and Critical Care Medicine Staff Physician, [place] Director - Interstitial Lung Disease  Program  Pulmonary [place] at [place], [place], [address]  Pager: [phone], If no answer or between  15:00h - 7:00h: call [phone] Telephone: [phone]

## 2017-10-18 ENCOUNTER — Telehealth: Payer: Self-pay | Admitting: Internal Medicine

## 2017-10-18 NOTE — Telephone Encounter (Signed)
1 Vial Order Date: 10/18/17 Shipping Date: 10/19/17

## 2017-10-20 NOTE — Telephone Encounter (Signed)
1 vial Arrival Date:10/20/2017 Lot #: [id number] Exp Date:03/2021

## 2017-10-31 ENCOUNTER — Ambulatory Visit (INDEPENDENT_AMBULATORY_CARE_PROVIDER_SITE_OTHER): Payer: Medicare Other

## 2017-10-31 DIAGNOSIS — J82 Pulmonary eosinophilia, not elsewhere classified: Secondary | ICD-10-CM

## 2017-10-31 DIAGNOSIS — J8289 Other pulmonary eosinophilia, not elsewhere classified: Secondary | ICD-10-CM

## 2017-11-03 MED ORDER — MEPOLIZUMAB 100 MG ~~LOC~~ SOLR
100.0000 mg | SUBCUTANEOUS | Status: DC
  Administered 2017-10-31: 100 mg via SUBCUTANEOUS

## 2017-11-03 NOTE — Progress Notes (Signed)
Documentation of medication administration and charges of Nucala have been completed by Desmond Dike, CMA based on the [facility] documentation sheet completed by Alroy Bailiff.

## 2017-11-14 ENCOUNTER — Other Ambulatory Visit: Payer: Self-pay | Admitting: Adult Health

## 2017-11-14 ENCOUNTER — Other Ambulatory Visit: Payer: Self-pay | Admitting: Internal Medicine

## 2017-11-17 ENCOUNTER — Telehealth: Payer: Self-pay | Admitting: Internal Medicine

## 2017-11-17 NOTE — Telephone Encounter (Signed)
Was able to provide needed info as requested.  No further questions///hdp

## 2017-11-30 ENCOUNTER — Ambulatory Visit: Payer: Medicare Other | Admitting: Internal Medicine

## 2017-11-30 ENCOUNTER — Encounter: Payer: Self-pay | Admitting: Internal Medicine

## 2017-11-30 ENCOUNTER — Telehealth: Payer: Self-pay | Admitting: Internal Medicine

## 2017-11-30 VITALS — BP 122/64 | HR 68 | Ht 62.0 in | Wt 151.2 lb

## 2017-11-30 DIAGNOSIS — D721 Eosinophilia, unspecified: Secondary | ICD-10-CM

## 2017-11-30 DIAGNOSIS — Z23 Encounter for immunization: Secondary | ICD-10-CM | POA: Diagnosis not present

## 2017-11-30 DIAGNOSIS — J4550 Severe persistent asthma, uncomplicated: Secondary | ICD-10-CM | POA: Diagnosis not present

## 2017-11-30 LAB — NITRIC OXIDE: Nitric Oxide: 158

## 2017-11-30 MED ORDER — ACLIDINIUM BROMIDE 400 MCG/ACT IN AEPB: 1.0000 | INHALATION_SPRAY | Freq: Two times a day (BID) | RESPIRATORY_TRACT | 11 refills | Status: DC

## 2017-11-30 MED ORDER — ACLIDINIUM BROMIDE 400 MCG/ACT IN AEPB: 1.0000 | INHALATION_SPRAY | Freq: Two times a day (BID) | RESPIRATORY_TRACT | 0 refills | Status: DC

## 2017-11-30 NOTE — Telephone Encounter (Signed)
Called [facility] back set up del., 12/08/17. Will wait for med to come in.

## 2017-11-30 NOTE — Addendum Note (Signed)
Addended by: Lorretta Harp on: 11/30/2017 10:53 AM   Modules accepted: Orders

## 2017-11-30 NOTE — Patient Instructions (Addendum)
ICD-10-CM   1. Severe persistent asthma, unspecified whether complicated H15.05   2. Eosinophilia D72.1     Not in flare up but having baseline severe uncontrolled symptoms  Plan Continue dulera and singulair scheduled Take sample spiriva or incruse of tudorza for 1 month - see if this gives added control Use albuterol as needed High dose flu shot 11/30/2017 Continue nucala but please get an update from [facility] where we are in process for change CMA to investigate why you are giving sputum cultures -   Followup 3 months or sooner if needed

## 2017-11-30 NOTE — Progress Notes (Signed)
OV 11/30/2016  Chief Complaint  Patient presents with  . Follow-up    Pt here after acute visit with TP on 8.31.2018. Pt states she is feeling improved but not back to baseline. Pt states she is still having prod cough with pale yellow mucus, still some SOB. Pt denies CP/tightness, f/c/s.      72 year old female with moderate persistent asthma. Last seen 11/25/2013 by her practitioner. Given prednisone and Z-Pak. Currently she is improved. Pulmonary function test yesterday shows normalized FEV1 and DLCO. Nitric oxide was high 3 weeks ago. She is here with [name]. Her son has graduated from anesthesia residency at the [employer]. She is worried about repeated it takes of steroids. She admitted to noncompliance with Symbicort and Singulair up until May 2018. She is also worried about her monoclonal antibody infusion for multiple sclerosis which she gets every 6 months for the last year or 2. She says after that she gets extremely fatigued and might require a prednisone burst. Occasionally she is not a respiratory infection. However she's been advised by her neurologist to continue with these injections in order to support her multiple sclerosis. There are no other new issues. She had a 7 mm lung nodule that resolved a year ago  72 year old female never smoker followed for moderate persistent asthma and lung nodule. MS on Infusion -OCREVUS every 6 months  Son is an anesthesiologist   01/13/2017 Follow up : Asthma  Pt returns for follow up for Asthma . She was seen on 12/27/16 for asthma bronchitic flare with 3 week hx of productive cough with thick yellow mucus and wheezing . Her FENO testing was elevated . She was treated with Zpack and Prednisone taper. Says she did get better with less cough, congestion and wheezing . She is concerned because she gets intermittent wheezing especially in am. Goes away after she uses Symbicort.  No fever, discolored mucus, chest pain,  orthopnea,.  She remains on Symbicort, Singulair and Zyrtec. Says she is compliant .  She says she and family are concerned regarding steroids use. We went over steroid use and potential side effects and goal is to limit use as much as possible . Last ov Nucala was ordered and is under approval process . This was added to help with improved asthma control .  She is concerned for adrenal insufficiency , discussed that it can occur with prolonged steroid use. She has been on three 1 week steroid tapers over last 4 months . Suggested she discuss this with PCP to have additional testing or referral to endocrinology if indicated.  FENO testing today is slightly improved at 150 .     Acute 01/27/17 72 year old with history of persistent asthma, multiple sclerosis on  complains of increasing dyspnea for the past 1 week associated with yellow mucus, wheezing, chest tightness.  She denies any nebulizer but has not received them yet.  Paperwork is underway for initiation of nucala but has not been started yet.   She has significant history of GERD with esophageal spasm, atypical chest pain.  She follows at [facility] for this.  She is taking Protonix alternating with Zyrtec.     OV 02/08/2017  Chief Complaint  Patient presents with  . Follow-up    Pt last seen by PM on 11.2.2018 for an acute visit. Pt states she started to imporve but now is feeling like she is catching a cold again. Pt c/o prod cough with yellow mucus. Pt  deniee SOB, CP/tihgtness and f/c/s.      72 year old female with eosinophilic and allergic asthma.  Presents for follow-up.  Most recently seen January 27, 2017 by my colleague for asthma exacerbation.  She is on her last day of prednisone today.  She feels improved.  Her nitric oxide has been the lowest it has been in 2018 which is 115 ppb but still significantly elevated.  She feels she is catching a cold although she feels overall better.  She is on Symbicort schedule and Singulair  scheduled.  She is not on any inhaled anticholinergic.  She is awaiting her interleukin-5 receptor antibody treatment.  She is worried about taking this because of cross interference with the other monoclonal antibody that she takes for her multiple sclerosis.  We had a discussion on this.  I did tell her that the cross-reactive and side effect profile is unknown and the general unpredictability can be higher.  Despite this we thought that the overall risk benefit ratio is in favor of her taking her interleukin-5 receptor antibody for asthma.   TEST  CT chest 01/2016 7 mm nodule resolved, stable other nodules , improved tree in bud nodularity  09/2016 IgE 4, neg RAST  09/2016 Eosinophils 300-400  Feno >.150 10/2016 >187 12/27/16  PFT 11/2016 >nml  FEV 1 , no restriction or obstruction , nml DLCO   OV 04/19/2017  Chief Complaint  Patient presents with  . Follow-up    has had 2 ED visit in December 2018 with asthma/bronchitis,no admission,feels like breathing at this time is better,she can tell improvement   Follow-up allergic asthma with eosinophilic asthma but normal IgE  She is here with [name] who runs a daycare for after school kids at [facility].  Her son is no longer at the [employer] but is now working in [place].  She tells me currently her asthma is stable.  She and her daughter are very categorical that the whole asthma started after she started taking monoclonal antibody infusions for her multiple sclerosis.   there is an infusion administered over 6 hours every 6 months.  She first started this infusion around November 2017.  She tells me that she has infusion reactions that start within a day or so of receiving the infusion.  These infusion reactions are characterized by body ache and fever and aggravation of past medical issues such as the meniscus.  She also has throat irritation and bronchospasm.  Then some 2 weeks later like clockwork she gets acute bronchitis which  is then treated as an asthma exacerbation.  She also has significant fatigue.  She says it takes her months to recover from this and she has a normal good 5 months before the cycle is repeated again.  Most recently the infusion was in November 2018 and then she ended up in the ER for asthma exacerbation in December 2018.  She was supposed to start interleukin-5 receptor antibody for asthma that was poorly controlled and because of persistent eosinophilia and high nitric oxide but given the cycle she and her daughter have decided against Nucala especially because she is convinced it is the [place] for multiple sclerosis that is causing this problem.  She is going to skip her next multiple sclerosis infusion in May 2019.  At this point in time she is compliant with her Symbicort.  Med review shows coreg   OV 05/10/2017  Chief Complaint  Patient presents with  . Acute Visit  Pt has complaints of coughing with thick yellow mucus and occ. dark brown mucus along with wheezing and hoarseness. Pt denies any fever.   15 -year-old female with asthma eosinophilic and moderate persistent on Symbicort.  She presents for an acute visit.  Since seeing me 3 weeks ago she had gradual deterioration in symptoms.  She is having significant nocturnal symptoms.  Her friends are telling her that her voice is hoarse.  She says she is compliant with her Symbicort and Singulair.  Her interleukin-5 receptor antibody is on hold because she is worried about interactions with the monoclonal antibody for multiple sclerosis still in her system and the washout of that is expected only June 2019.  Currently her asthma control questionnaire shows a score of 3.6 showing significant symptoms.  She is waking up a few times at night because of asthma symptoms and when she wakes up she has moderate symptoms.  She is moderately limited in her activities because of asthma.  She is short of breath quite a bit and wheezing all the time although  she hold off on using her albuterol for rescue.  Med review shows that she is on nonspecific beta blocker carvedilol.  There is no fever or chills.    OV 06/08/2017  Chief Complaint  Patient presents with  . Follow-up    Pt has had problems with side effects from the spiriva due to eye problems and dry mouth.  Breo and spiriva had helped with the wheezing but due to side effects, meds were stopped x2 days. States she had a coughing fit with the meds as well. Pt has c/o cough with yellow mucus, SOB, and is back to wheezing.   72 year old female with asthma eosinophilic and moderate persistent.  At last visit she was in acute visit.  I added Spiriva to her regimen.  Switch to Symbicort 2 higher dose Brio.  I asked her to continue her Singulair.  She still was not interested in her biologic therapy given her issues doing biologic therapy with multiple sclerosis.  We also had her talk to her physician and get her nonspecific beta-blocker carvedilol switched out.  She did all this.  However she tells me that Spiriva is causing dry mouth and she stopped it like a week ago.  For some unclear reason she also stopped the Brio for a few days a week ago and then again for a few days earlier this week and then starting yesterday or like 2 days ago started having worsening asthma symptoms.  She is now reporting significant cough.  When she wakes up she has mild symptoms when she is active she is very slightly limited because of her asthma.  She is short of breath a moderate amount because of the asthma and is wheezing a moderate amount because of the asthma and in the last 2 days most of the time and she is now using nebulizer for rescue 2 times daily.  Average asthma control question as 2.2.  Her exact nitric oxide test continues to be elevated at 156 as always.  She is now accepting that she will have to go through biologic therapy.  Her daughter status with her.  The questioning alternative etiologies.  She tells me  that she might have mold in the house.  Review of the labs show she had normal allergy blood test in July 2018 and a clear chest x-ray in December 2018 [CT chest in November showed tree-in-bud in the right upper lobe 1  month earlier] IgE was normal back in July 2018.  Aspergillus panel never checked.   07/06/2017  Pt. Was seen for an acute flare 06/08/2017 after stopping her Breo. At that visit, Dr. Golden Pop plan was as follows: Plan Retest for ABPA             - check IgE, aspergillus antibody blood test , do CXR Take prednisone 40 mg daily x 2 days, then 20mg  daily x 2 days, then 10mg  daily x 2 days, then 5mg  daily x 2 days and stop Start dulera high dose - you need to be on inhaled steroids No spiriva for you Continue singulair Glad coreg got changed out Start Nucala starting next week FENO at this visit was 156 ppb  Followup 4-6 weeks or sooner if needed with myself or APP             - feno and ACQ at follow up  Pt. Presents today . She states she completed he prednisone taper . She has been compliant with her Dulera, Singulair and Zyrtec. She has not had to use her rescue inhaler. She is not coughing. She states maybe once daily. Her cough is productive for yellow secretions on the occasion she does cough. She has had 1 Nucala injection.She did have some fatigue 1 week after the injection. It lasted x 1 day. She did have some mild arm tenderness one week after the injection. She does feel she is better than she was last month.She denies fever, chest pain, orthopnea or hemoptysis.    OV 08/08/2017  Chief Complaint  Patient presents with  . Follow-up    Pt states she is coughing up a lot of yellow mucus in the mornings. Other than the cough, pt states she has been doing good.    [name] presents for follow-up.  She has severe eosinophilic asthma that is poorly controlled with persistently significant high elevated nitric oxide levels  I personally saw her in mid March  2019.  At that time I recommended starting nucala.  She is now had 2 doses 4 weeks apart of that same injection.  Today is the third dose.  She says for the last few weeks she is noticing increased sputum production and loosening of the sputum and a yellow color in the sputum.  She feels that it is because the injection is working.  She does not feel it is the asthma exacerbation.  Her exam nitric oxide continues to be very high at 162 today.  She says she is compliant with her Dulera.  In terms of her multiple sclerosis she is coming off Ocrivis and going to sart on TYSABRI.  the care for this is being coordinated by Dr. Gorden Harms.  And apparently the neurologist was wondering about she says that between the last visit in this current visit she got her immunoglobulin checked by him and all these levels were low the correlation of that and asthma.  With in our labs we do notice that the IgE levels are low.  Currently she is not running any fever or chills or orthopnea or active wheezing   OV 10/16/2017  Chief Complaint  Patient presents with  . Follow-up    Pt states she has been doing well since last OV with Robyn Quaker, NP but states she is still coughing up phlegm.    Aryia Delira presents for follow-up.  She has severe persistent asthma with eosinophila asthma that is poorly controlled with persistently significant high elevated  nitric oxide levels usually > 150. On Nucala since mid-march 2019   Since I saw her last 2 months ago she's had an exacerbation requiring prednisone. She continues on interleukin-5 receptor antibody subcutaneous injections every 4 weeks since mid March 2019. Currently she feels better than before asthma control questionnaire is1.2 and is on the better score she's had over time.However nitric oxide test is significantly elevated and worse and now in the 200s. She is very puzzled by this. She denies anyone infestation. Most recent eosinophil count to show improvement. But she  still overall very symptomatic and still requiring prednisone. We discussed the switch to another biologic dupulimab and she is open to this idea    OV 11/30/2017  Subjective:  Patient ID: [name], female , DOB: 1945/03/29 , age 76 y.o. , MRN: [id number] , ADDRESS: [address] [facility] [id number]   11/30/2017 -   Chief Complaint  Patient presents with  . Follow-up    Pt states she has been doing good since last visit except states she has had a headache x2 days and has had a cough. Pt also has c/o chest tightness.     HPI CHARRIE MCCONNON 72 y.o. -follow-up severe persistent asthma with poorly controlled symptoms. She is on Dulera, Singulair and biologic Nucala. She reports that currently it is one of her better days and weeks. Despite that asthma control questionnaire shows significant symptom with the 5 point score of 1.8. She says she is not waking up in the middle of the night because of asthma when she wakes up she is very mild symptoms when she is very slightly limited in her activities and she is moderately short of breath and wheezing a lot of the time but not using much albuterol for rescue. This is despite compliance. Last visit we switched her to [place] but this is stuck with insurance pre-authorization required and that is some delays with this. In addition she also tells me that at every visit she's been giving sputum samples at our lab basementand she is unclear why no one called her with results. I have told her that I was unaware that this was going on. Review of the charts indicate sputum samples given October 2018 in April 2019. Along and consistent with a respiratory in asthma symptoms at that early morning she has cough with yellow sputum which is consistent with high airway eosinophil load as evidenced by significantly high exhaled nitric oxide is despite biologic therapy.  [name]  ROS - per HPI    Results for LAMAE, FOSCO (MRN [id number]) as of 04/19/2017 10:02   Ref. Range 10/07/2016 12:29 05/10/2017  06/08/2017 Start nucala 08/08/2017  10/16/2017   11/30/2017   Nitric Oxide Unknown 180 153 156 162 237 158  ACQ score   3.6   2.2  1.2 1.8   Results for JAMIELYN, PETRUCCI (MRN [id number]) as of 10/16/2017 10:02  Ref. Range 05/10/2009 04:32 10/07/2016 13:11 10/07/2016 20:51 03/19/2017 15:45 08/08/2017 11:32  Eosinophils Absolute Latest Ref Range: 0.0 - 0.7 K/uL 0.1 0.4 0.3 0.3 0.0  Results for JAYRA, CHOYCE (MRN [id number]) as of 05/10/2017 12:14  Ref. Range 10/07/2016 13:11  IgEcoming (Immunoglobulin E), Serum Latest Ref Range: <115 kU/L 4     has a past medical history of Anemia, Anginal pain (HCC), Arthritis, Complication of anesthesia, Coronary artery disease, Dry eyes, Esophageal spasm, GERD (gastroesophageal reflux disease), and MS (multiple sclerosis) ([facility]).   reports that she has never smoked. She  has never used smokeless tobacco.  Past Surgical History:  Procedure Laterality Date  . ABDOMINAL HYSTERECTOMY    . BLEPHAROPLASTY Bilateral   . CARDIAC CATHETERIZATION  10/04/06   MINOR CAD,SINGLE VESSEL INVOLVING THE CIRCUMFLEX. 20 TO 30% PROXIMALLY AND 10 TO 20% IN THE MIDDLE SEGMENT.MILD MUSCLE BRIDGING, MID LAD.NORMAL RCA.NORMAL LV FUNCTION.NORMAL MITRAL AND AORTIC VALVE.NORMAL APPEARING AORTA,THORACIC AND ABDOMINAL.NORMAL RENAL ARTERIES.  [name] CARDIOLOGY NUCLEAR MED STUDY  06/22/12   NL LV FUNCTION,EF 68%,NL WALL MOTION.  [name] CAROTID DUPLEX  07/02/11   HQI:ONGE SOFT PLAQUE NOTED DISTAL CCA AND ORGIN AND PROXIMAL ICA,LEFT>RIGHT.NO ICA STENOSIS. VERTEBRAL ARTERY FLOW IS ANTEGRADE.  [name] CESAREAN SECTION     x2   . COLONOSCOPY WITH PROPOFOL N/A 12/11/2014   Procedure: COLONOSCOPY WITH PROPOFOL;  Surgeon: Ronald Lobo, MD;  Location: [facility];  Service: Endoscopy;  Laterality: N/A;  . KNEE ARTHROSCOPY Left    scope  . PARATHYROIDECTOMY     partial-many years ago  . thumb surgery Bilateral    built up and bone removal  . TONSILLECTOMY    . TRANSTHORACIC  ECHOCARDIOGRAM  07/02/11   LV CAVITY SIZE IS NORMAL. SYSTOLIC FUNCTION WAS NORMAL.EF=55% TO 60%.INCREASED RELATIVE CONTRIBUTION OF ATRIAL CONTRACTION TO VENTRICULAR FILLING;MAYBE DUE TO HYPOVOLEMIA. AV=MILD REGURG.    Allergies  Allergen Reactions  . Crestor [Rosuvastatin] Other (See Comments)    Joint pain   . Iodinated Diagnostic Agents Other (See Comments)    Sneezing and itchy throat; dye was Isovue 300  . Vicodin [Hydrocodone-Acetaminophen]     Made pass-out one time and is okay taking now  . Isovue [Iopamidol]     Pt had sneezing and itching of her throat and soft palate.  Dr Alvester Chou checked pt.  She will need premeds in the future.  J Bohm    Immunization History  Administered Date(s) Administered  . Influenza, High Dose Seasonal PF 12/22/2013, 12/14/2015, 12/14/2016  . Influenza,inj,Quad PF,6+ Mos 11/27/2014  . Pneumococcal Polysaccharide-23 10/26/2013  . Pneumococcal-Unspecified 10/26/2013  . Tdap 06/14/2011  . Zoster Recombinat (Shingrix) 11/13/2017    Family History  Problem Relation Age of Onset  . Heart disease Mother   . Asthma Father   . Bladder Cancer Father   . Heart failure Father   . Hyperlipidemia Brother      Current Outpatient Medications:  .  albuterol (PROVENTIL HFA;VENTOLIN HFA) 108 (90 Base) MCG/ACT inhaler, Inhale 1-2 puffs into the lungs every 6 (six) hours as needed for wheezing or shortness of breath., Disp: 1 Inhaler, Rfl: 5 .  amLODipine (NORVASC) 5 MG tablet, , Disp: , Rfl:  .  aspirin EC 81 MG tablet, Take 1 tablet (81 mg total) by mouth daily., Disp: 90 tablet, Rfl: 3 .  calcium-vitamin D (OSCAL WITH D) 500-200 MG-UNIT tablet, Take 1 tablet by mouth daily., Disp: , Rfl:  .  cetirizine (ZYRTEC ALLERGY) 10 MG tablet, Take 1 tablet (10 mg total) by mouth daily., Disp: 30 tablet, Rfl: 5 .  Cholecalciferol (VITAMIN D PO), Take 1 tablet by mouth daily., Disp: , Rfl:  .  CINNAMON PO, Take 1 tablet by mouth daily., Disp: , Rfl:  .  diazepam (VALIUM)  2 MG tablet, , Disp: , Rfl:  .  isosorbide mononitrate (IMDUR) 60 MG 24 hr tablet, TAKE 1 TABLET ONCE DAILY., Disp: 30 tablet, Rfl: 11 .  levothyroxine (SYNTHROID, LEVOTHROID) 25 MCG tablet, Take 25 mcg daily before breakfast by mouth., Disp: , Rfl:  .  losartan (COZAAR) 50 MG tablet, , Disp: ,  Rfl:  .  mometasone-formoterol (DULERA) 200-5 MCG/ACT AERO, Inhale 2 puffs into the lungs 2 (two) times daily., Disp: 1 Inhaler, Rfl: 5 .  montelukast (SINGULAIR) 10 MG tablet, TAKE ONE TABLET AT BEDTIME., Disp: 30 tablet, Rfl: 3 .  Multiple Vitamin (MULTIVITAMIN WITH MINERALS) TABS tablet, Take 1 tablet by mouth daily., Disp: , Rfl:  .  NITROSTAT 0.4 MG SL tablet, PLACE 1 TAB UNDER THE TONGUE AS NEEDED FOR CHEST PAIN MAY REPEAT EVERY 5 MINUTES., Disp: 25 tablet, Rfl: 3 .  pantoprazole (PROTONIX) 20 MG tablet, TAKE 1 TABLET BY MOUTH TWICE DAILY., Disp: 60 tablet, Rfl: 0 .  PARoxetine (PAXIL) 10 MG tablet, Take 5 mg by mouth daily. , Disp: , Rfl:  .  Respiratory Therapy Supplies (FLUTTER) DEVI, Use as directed, Disp: 1 each, Rfl: 0 .  VYTORIN 10-20 MG per tablet, Take 1 tablet by mouth daily. , Disp: , Rfl:   Current Facility-Administered Medications:  [name]  Mepolizumab SOLR 100 mg, 100 mg, Subcutaneous, Q28 days, Brand Males, MD, 100 mg at 06/14/17 0917 .  Mepolizumab SOLR 100 mg, 100 mg, Subcutaneous, Q28 days, Brand Males, MD, 100 mg at 07/12/17 0902 .  Mepolizumab SOLR 100 mg, 100 mg, Subcutaneous, Q28 days, Brand Males, MD, 100 mg at 10/31/17 0903      Objective:   Vitals:   11/30/17 0957  BP: 122/64  Pulse: 68  SpO2: 95%  Weight: 151 lb 3.2 oz (68.6 kg)  Height: 5\' 2"  (1.575 m)    Estimated body mass index is 27.65 kg/m as calculated from the following:   Height as of this encounter: 5\' 2"  (1.575 m).   Weight as of this encounter: 151 lb 3.2 oz (68.6 kg).   Filed Weights   11/30/17 0957  Weight: 151 lb 3.2 oz (68.6 kg)     Physical Exam  General Appearance:     Alert, cooperative, no distress, appears stated age - yes , sitting on - chair  Head:    Normocephalic, without obvious abnormality, atraumatic  Eyes:    PERRL, conjunctiva/corneas clear,  Ears:    Normal TM's and external ear canals, both ears  Nose:   Nares normal, septum midline, mucosa normal, no drainage    or sinus tenderness. OXYGEN ON  - no . Patient is @ room air   Throat:   Lips, mucosa, and tongue normal; teeth and gums normal. Cyanosis on lips - no  Neck:   Supple, symmetrical, trachea midline, no adenopathy;    thyroid:  no enlargement/tenderness/nodules; no carotid   bruit or JVD  Back:     Symmetric, no curvature, ROM normal, no CVA tenderness  Lungs:     Distress - no , Wheeze no, Barrell Chest - no, Purse lip breathing - no, Crackles - no   Chest Wall:    No tenderness or deformity. Scars in chest no   Heart:    Regular rate and rhythm, S1 and S2 normal, no murmur, rub   or gallop  Breast Exam:    NOT DONE  Abdomen:     Soft, non-tender, bowel sounds active all four quadrants,    no masses, no organomegaly  Genitalia:   NOT DONE  Rectal:   NOT DONE  Extremities:   Extremities normal, atraumatic, Clubbing - no, Edema - no  Pulses:   2+ and symmetric all extremities  Skin:   Stigmata of Connective Tissue Disease - no  Lymph nodes:   Cervical, supraclavicular, and axillary nodes normal  Psychiatric:  Neurologic:   pleasant CNII-XII intact, normal strength, sensation  throughout           Assessment:       ICD-10-CM   1. Severe persistent asthma, unspecified whether complicated A06.01   2. Eosinophilia D72.1        Plan:       Not in flare up but having baseline severe uncontrolled symptoms  Plan Continue dulera and singulair scheduled Take sample spiriva or incruse of tudorza for 1 month - see if this gives added control Use albuterol as needed High dose flu shot 11/30/2017 Continue nucala but please get an update from [facility] where we are in  process for change CMA to investigate why you are giving sputum cultures -   Followup 3 months or sooner if needed           SIGNATURE    Dr. Brand Males, M.D., F.C.C.P,  Pulmonary and Critical Care Medicine Staff Physician, [facility] Director - Interstitial Lung Disease  Program  [facility] [place], [place], [address]  Pager: [phone], If no answer or between  15:00h - 7:00h: call [phone] Telephone: [phone]  10:33 AM 11/30/2017

## 2017-11-30 NOTE — Telephone Encounter (Signed)
1 prefilled syringe Ordered Date: 11/30/17 Shipping Date: 12/07/17

## 2017-12-01 NOTE — Telephone Encounter (Signed)
Pt. Is coming in for 1st Dupixent inj on 12/08/17 at 1:30.

## 2017-12-06 ENCOUNTER — Telehealth: Payer: Self-pay | Admitting: Internal Medicine

## 2017-12-06 NOTE — Telephone Encounter (Signed)
Received a PA Request from [facility] for [place]. [place] is [id number]. Per [employer], determination will take up to 72 hours.   Will route to [facility] for follow up.

## 2017-12-07 NOTE — Telephone Encounter (Signed)
Checked on [place]. Still awaiting determination.

## 2017-12-08 ENCOUNTER — Ambulatory Visit: Payer: Medicare Other

## 2017-12-08 ENCOUNTER — Ambulatory Visit (INDEPENDENT_AMBULATORY_CARE_PROVIDER_SITE_OTHER): Payer: Medicare Other

## 2017-12-08 DIAGNOSIS — J4550 Severe persistent asthma, uncomplicated: Secondary | ICD-10-CM | POA: Diagnosis not present

## 2017-12-08 MED ORDER — DUPILUMAB 300 MG/2ML ~~LOC~~ SOSY
600.0000 mg | PREFILLED_SYRINGE | Freq: Once | SUBCUTANEOUS | Status: AC
  Administered 2017-12-08: 600 mg via SUBCUTANEOUS

## 2017-12-08 NOTE — Telephone Encounter (Signed)
1 prefilled syringe Arrival Date: 12/08/17 Lot #:[id number] Exp date: 04/2019

## 2017-12-08 NOTE — Telephone Encounter (Signed)
Checked status of PA via [email or web address], PA was denied.  Called insurance to see what covered alternatives are available, states that pt must try and fail Inruse before Caprice Renshaw can be covered.    MR please advise if you'd like want to switch pt to Incruse per insurance requirements.  Thanks!

## 2017-12-08 NOTE — Telephone Encounter (Signed)
Spoke with pt, she states the [name] is keeping her wheezing under control. She would like to finish her samples and decide whether she wants to switch to Incruse. She is willing to pay cash price. She will follow up with a phone call to let us know what she would like to do. FYI MR.

## 2017-12-08 NOTE — Telephone Encounter (Signed)
incruse is fine

## 2017-12-11 NOTE — Telephone Encounter (Signed)
Called and spoke with patient she is aware and verbalized understanding. Nothing further

## 2017-12-11 NOTE — Telephone Encounter (Signed)
Let her know that if tudorza is helping  Not to pay cash price for it because incruse/spiriva will do same and she can try those samples first and if working use insurance to pay for these expensive meds

## 2017-12-13 NOTE — Telephone Encounter (Signed)
lmtcb for pt.  

## 2017-12-19 ENCOUNTER — Telehealth: Payer: Self-pay | Admitting: Internal Medicine

## 2017-12-19 MED ORDER — NYSTATIN 100000 UNIT/ML MT SUSP: 5.0000 mL | Freq: Four times a day (QID) | OROMUCOSAL | 0 refills | Status: AC

## 2017-12-19 NOTE — Telephone Encounter (Signed)
Left message for patient to call back  

## 2017-12-19 NOTE — Telephone Encounter (Signed)
Spoke with patient. She stated that the Tudorza inhaler was added to her daily treatment on 11/30/17. Since then, she has developed a burning sensation in her mouth. Last week, she noticed that she has began to develop thrush. She denies any symptoms of SOB or sore throat.   She wishes to have a mouthwash called into [facility].   MR, please advise. Thanks!

## 2017-12-19 NOTE — Telephone Encounter (Signed)
Pt returning call. Pt contact number [phone]

## 2017-12-19 NOTE — Telephone Encounter (Signed)
Called and spoke to patient. Advised her of Dr. Golden Pop recommendations. Rx sent to pharmacy of choice. Nothing further needed at this time.

## 2017-12-19 NOTE — Telephone Encounter (Signed)
tudorza can cause burning but not thrush. THe ICS is causing thrush   Plan # Oral thrush - For Oral thrush: Take Suspension (swish and swallow): 500,000 units 4 times/day for 5 days; swish in the mouth and retain for as long as possible (several minutes) before swallowing. IF nystatin is in back order: alternatives are  clotrimazole troche (throt lozenge) 10mg  dissolved and swish and swallow 5 times a day for 14 days.   - she can certainly come and get spiriva respimat from [place] and try instead of tudorza

## 2017-12-19 NOTE — Telephone Encounter (Signed)
Patient awaiting for MR to advise. Thanks!

## 2017-12-20 ENCOUNTER — Ambulatory Visit: Payer: Medicare Other | Admitting: Cardiovascular Disease

## 2017-12-20 ENCOUNTER — Encounter: Payer: Self-pay | Admitting: Cardiovascular Disease

## 2017-12-20 ENCOUNTER — Telehealth: Payer: Self-pay | Admitting: Internal Medicine

## 2017-12-20 VITALS — BP 148/77 | HR 71 | Ht 63.0 in | Wt 151.2 lb

## 2017-12-20 DIAGNOSIS — K224 Dyskinesia of esophagus: Secondary | ICD-10-CM

## 2017-12-20 DIAGNOSIS — I10 Essential (primary) hypertension: Secondary | ICD-10-CM

## 2017-12-20 DIAGNOSIS — J4520 Mild intermittent asthma, uncomplicated: Secondary | ICD-10-CM

## 2017-12-20 DIAGNOSIS — G478 Other sleep disorders: Secondary | ICD-10-CM | POA: Diagnosis not present

## 2017-12-20 DIAGNOSIS — R351 Nocturia: Secondary | ICD-10-CM | POA: Diagnosis not present

## 2017-12-20 DIAGNOSIS — I350 Nonrheumatic aortic (valve) stenosis: Secondary | ICD-10-CM

## 2017-12-20 DIAGNOSIS — R0683 Snoring: Secondary | ICD-10-CM

## 2017-12-20 DIAGNOSIS — R002 Palpitations: Secondary | ICD-10-CM | POA: Diagnosis not present

## 2017-12-20 DIAGNOSIS — E785 Hyperlipidemia, unspecified: Secondary | ICD-10-CM

## 2017-12-20 MED ORDER — DILTIAZEM HCL ER COATED BEADS 240 MG PO CP24: 240.0000 mg | ORAL_CAPSULE | Freq: Every day | ORAL | 3 refills | Status: DC

## 2017-12-20 NOTE — Patient Instructions (Signed)
Medication Instructions:  STOP amlodipine START Cardizem CD 240 mg daily after dinner  Testing/Procedures: Your physician has recommended that you have a sleep study. This test records several body functions during sleep, including: brain activity, eye movement, oxygen and carbon dioxide blood levels, heart rate and rhythm, breathing rate and rhythm, the flow of air through your mouth and nose, snoring, body muscle movements, and chest and belly movement.  Follow-Up: 3-4 months with Dr. Claiborne Billings  Any Other Special Instructions Will Be Listed Below (If Applicable).     If you need a refill on your cardiac medications before your next appointment, please call your pharmacy.

## 2017-12-20 NOTE — Progress Notes (Signed)
Patient ID: [name], female   DOB: 01-20-1946, 72 y.o.   MRN: [id number]    PCP: Dr. [name]  HPI: [name] is a 72 y.o. female who presents to the office today for a 2 year follow up cardiology evaluation.  [name] has documented mild CAD by  cardiac catheterization in July 2008 by Dr. Roe Rutherford.  She had mild narrowing of 20-30% in the proximal and 10-20% in the mid left circumflex vessel.  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  She has been on medical therapy.  She also has a history of hypertension,.  Her last stress test was done in March 2014 were she had nonspecific T changes and nondiagnostic 0.5-1 mm inferolateral ST segment changes with stress.  Scintigraphic images revealed normal perfusion and function.  She has a history of multiple sclerosis and is followed by Dr. Delphia Grates in [place].  She has a history of hyperlipidemia for which she takes Vytorin 10/20 and GERD for which he takes over-the-counter Prilosec.   She has experienced recent episodes of chest pain which have been occurring almost weekly. She experiences squeezing in her arms and jaw.  She denies chest pressure.  The symptoms are not associated with activity and typically resolve on her own.  She has taken nitroglycerin with questionable benefit.  She also notes calf discomfort at night while sleeping.  She denies restless legs.  She was started on a human monoclonal antibody ocrelizumab  for her multiple sclerosis and admits to improvement in symptomatology.  When I saw her last year she had experienced recurrent episodes of chest pain with some atypical features.  She underwent a nuclear perfusion study in December 2016 which revealed normal perfusion with an ejection fraction of 66%.  She developed recurrent chest pain in January and again was felt to be stable cardiovascularly.  She subsequently was evaluated at [facility] and was felt that her chest discomfort was  due to esophageal lower esophageal sphincter spasm and inability to relax appropriately.  Her symptoms have improved with Protonix and she is now being weaned off Protonix and has been started on Zantac.  Since I last saw her in October 2016, she has been evaluated on several occasions by Mammie Russian with his most recent evaluation in March 2019.  He had seen her in December 2018 with chest pain more consistent with esophageal spasm.  An echocardiogram in December 2018 showed an EF of 60 to 65% with grade 1 diastolic dysfunction, mild aortic stenosis with trivial AR.  She has had issues with asthma.  He also had experienced some palpitations and a 24-hour monitor revealed short bursts of SVT, PACs and PVCs.  Palpitations have improved though she still experiences some palpitations at night while falling asleep.  She eats chocolate on a daily basis.  Her sleep is very poor.  She has frequent awakenings.  She snores.  She has nocturia at least 3 times per night.  He denies any chest pain.  She presents for evaluation.     Past Medical History:  Diagnosis Date  . Anemia    past history-many yrs ago  . Anginal pain ([place])    being evaluated by Dr. Tyrone Sage, arm pain,"bad indigestion" -no heart related findings as of yet  . Arthritis    hip. back pain  . Complication of anesthesia    s/p Hysterectomy "vagal response "heart stopped" -did not require shocking.  . Coronary artery  disease   . Dry eyes   . Esophageal spasm   . GERD (gastroesophageal reflux disease)   . MS (multiple sclerosis) ([place])    stable-sees [name] every 6 months    Past Surgical History:  Procedure Laterality Date  . ABDOMINAL HYSTERECTOMY    . BLEPHAROPLASTY Bilateral   . CARDIAC CATHETERIZATION  10/04/06   MINOR CAD,SINGLE VESSEL INVOLVING THE CIRCUMFLEX. 20 TO 30% PROXIMALLY AND 10 TO 20% IN THE MIDDLE SEGMENT.MILD MUSCLE BRIDGING, MID LAD.NORMAL RCA.NORMAL LV FUNCTION.NORMAL MITRAL AND AORTIC VALVE.NORMAL APPEARING  AORTA,THORACIC AND ABDOMINAL.NORMAL RENAL ARTERIES.  [name] CARDIOLOGY NUCLEAR MED STUDY  06/22/12   NL LV FUNCTION,EF 68%,NL WALL MOTION.  [name] CAROTID DUPLEX  07/02/11   CXK:GYJE SOFT PLAQUE NOTED DISTAL CCA AND ORGIN AND PROXIMAL ICA,LEFT>RIGHT.NO ICA STENOSIS. VERTEBRAL ARTERY FLOW IS ANTEGRADE.  [name] CESAREAN SECTION     x2   . COLONOSCOPY WITH PROPOFOL N/A 12/11/2014   Procedure: COLONOSCOPY WITH PROPOFOL;  Surgeon: Ronald Lobo, MD;  Location: [facility] END[facility];  Service: Endoscopy;  Laterality: N/A;  . KNEE ARTHROSCOPY Left    scope  . PARATHYROIDECTOMY     partial-many years ago  . thumb surgery Bilateral    built up and bone removal  . TONSILLECTOMY    . TRANSTHORACIC ECHOCARDIOGRAM  07/02/11   LV CAVITY SIZE IS NORMAL. SYSTOLIC FUNCTION WAS NORMAL.EF=55% TO 60%.INCREASED RELATIVE CONTRIBUTION OF ATRIAL CONTRACTION TO VENTRICULAR FILLING;MAYBE DUE TO HYPOVOLEMIA. AV=MILD REGURG.    Allergies  Allergen Reactions  . Crestor [Rosuvastatin] Other (See Comments)    Joint pain   . Iodinated Diagnostic Agents Other (See Comments)    Sneezing and itchy throat; dye was Isovue 300  . Vicodin [Hydrocodone-Acetaminophen]     Made pass-out one time and is okay taking now  . Isovue [Iopamidol]     Pt had sneezing and itching of her throat and soft palate.  Dr Alvester Chou checked pt.  She will need premeds in the future.  J Bohm    Current Outpatient Medications  Medication Sig Dispense Refill  . Aclidinium Bromide (TUDORZA PRESSAIR) 400 MCG/ACT AEPB Inhale into the lungs.    [name] albuterol (PROVENTIL HFA;VENTOLIN HFA) 108 (90 Base) MCG/ACT inhaler Inhale 1-2 puffs into the lungs every 6 (six) hours as needed for wheezing or shortness of breath. 1 Inhaler 5  . aspirin EC 81 MG tablet Take 1 tablet (81 mg total) by mouth daily. 90 tablet 3  . calcium-vitamin D (OSCAL WITH D) 500-200 MG-UNIT tablet Take 1 tablet by mouth daily.    . cetirizine (ZYRTEC ALLERGY) 10 MG tablet Take 1 tablet (10 mg total) by mouth  daily. 30 tablet 5  . Cholecalciferol (VITAMIN D PO) Take 1 tablet by mouth daily.    [name] CINNAMON PO Take 1 tablet by mouth daily.    . diazepam (VALIUM) 2 MG tablet     . isosorbide mononitrate (IMDUR) 60 MG 24 hr tablet TAKE 1 TABLET ONCE DAILY. 30 tablet 11  . levothyroxine (SYNTHROID, LEVOTHROID) 25 MCG tablet Take 25 mcg daily before breakfast by mouth.    . losartan (COZAAR) 50 MG tablet     . mometasone-formoterol (DULERA) 200-5 MCG/ACT AERO Inhale 2 puffs into the lungs 2 (two) times daily. 1 Inhaler 5  . montelukast (SINGULAIR) 10 MG tablet TAKE ONE TABLET AT BEDTIME. 30 tablet 3  . Multiple Vitamin (MULTIVITAMIN WITH MINERALS) TABS tablet Take 1 tablet by mouth daily.    [name] NITROSTAT 0.4 MG SL tablet PLACE 1 TAB UNDER THE  TONGUE AS NEEDED FOR CHEST PAIN MAY REPEAT EVERY 5 MINUTES. 25 tablet 3  . PARoxetine (PAXIL) 10 MG tablet Take 5 mg by mouth daily.     [name] Respiratory Therapy Supplies (FLUTTER) DEVI Use as directed 1 each 0  . VYTORIN 10-20 MG per tablet Take 1 tablet by mouth daily.     [name] diltiazem (CARDIZEM CD) 240 MG 24 hr capsule Take 1 capsule (240 mg total) by mouth daily. 90 capsule 3  . pantoprazole (PROTONIX) 20 MG tablet TAKE 1 TABLET BY MOUTH TWICE DAILY. 90 tablet 3   Current Facility-Administered Medications  Medication Dose Route Frequency Provider Last Rate Last Dose  . Mepolizumab SOLR 100 mg  100 mg Subcutaneous Q28 days Brand Males, MD   100 mg at 06/14/17 0917  . Mepolizumab SOLR 100 mg  100 mg Subcutaneous Q28 days Brand Males, MD   100 mg at 07/12/17 0902  . Mepolizumab SOLR 100 mg  100 mg Subcutaneous Q28 days Brand Males, MD   100 mg at 10/31/17 7628    Social History   Socioeconomic History  . Marital status: Divorced    Spouse name: Not on file  . Number of children: Not on file  . Years of education: Not on file  . Highest education level: Not on file  Occupational History  . Occupation: retired  Scientific laboratory technician  . Financial  resource strain: Not on file  . Food insecurity:    Worry: Not on file    Inability: Not on file  . Transportation needs:    Medical: Not on file    Non-medical: Not on file  Tobacco Use  . Smoking status: Never Smoker  . Smokeless tobacco: Never Used  Substance and Sexual Activity  . Alcohol use: Yes    Alcohol/week: 0.0 standard drinks    Comment: wine occ.  . Drug use: No  . Sexual activity: Not on file  Lifestyle  . Physical activity:    Days per week: Not on file    Minutes per session: Not on file  . Stress: Not on file  Relationships  . Social connections:    Talks on phone: Not on file    Gets together: Not on file    Attends religious service: Not on file    Active member of club or organization: Not on file    Attends meetings of clubs or organizations: Not on file    Relationship status: Not on file  . Intimate partner violence:    Fear of current or ex partner: Not on file    Emotionally abused: Not on file    Physically abused: Not on file    Forced sexual activity: Not on file  Other Topics Concern  . Not on file  Social History Narrative  . Not on file    Family History  Problem Relation Age of Onset  . Heart disease Mother   . Asthma Father   . Bladder Cancer Father   . Heart failure Father   . Hyperlipidemia Brother     ROS General: Negative; No fevers, chills, or night sweats HEENT: Negative; No changes in vision or hearing, sinus congestion, difficulty swallowing Pulmonary: Negative; No cough, wheezing, shortness of breath, hemoptysis Cardiovascular: See history of present illness; No presyncope, syncope, palpatations GI: Positive for esophageal lower esophageal sphincter spasm GU: Negative; No dysuria, hematuria, or difficulty voiding Musculoskeletal: Negative; no myalgias, joint pain, or weakness Hematologic: Negative; no easy bruising, bleeding Endocrine: Negative; no heat/cold  intolerance; no diabetes, Neuro: Positive for multiple  sclerosis, currently on no therapy, no changes in balance, headaches Skin: Negative; No rashes or skin lesions Psychiatric: Negative; No behavioral problems, depression Sleep: Positive for snoring,  daytime sleepiness, hypersomnolence, bruxism, restless legs, hypnogognic hallucinations. Other comprehensive 14 point system review is negative    Physical Exam BP (!) 148/77   Pulse 71   Ht 5' 3"  (1.6 m)   Wt 151 lb 3.2 oz (68.6 kg)   BMI 26.78 kg/m    Repeat blood pressure by me 140/78  Wt Readings from Last 3 Encounters:  12/20/17 151 lb 3.2 oz (68.6 kg)  11/30/17 151 lb 3.2 oz (68.6 kg)  10/16/17 153 lb 9.6 oz (69.7 kg)   General: Alert, oriented, no distress.  Skin: normal turgor, no rashes, warm and dry HEENT: Normocephalic, atraumatic. Pupils equal round and reactive to light; sclera anicteric; extraocular muscles intact;  Nose without nasal septal hypertrophy Mouth/Parynx benign; Mallinpatti scale 3 Neck: No JVD, no carotid bruits; normal carotid upstroke Lungs: clear to ausculatation and percussion; no wheezing or rales Chest wall: without tenderness to palpitation Heart: PMI not displaced, RRR, s1 s2 normal, 1/6 systolic murmur, no diastolic murmur, no rubs, gallops, thrills, or heaves Abdomen: soft, nontender; no hepatosplenomehaly, BS+; abdominal aorta nontender and not dilated by palpation. Back: no CVA tenderness Pulses 2+ Musculoskeletal: full range of motion, normal strength, no joint deformities Extremities: no clubbing cyanosis or edema, Homan's sign negative  Neurologic: grossly nonfocal; Cranial nerves grossly wnl Psychologic: Normal mood and affect   ECG (independently read by me): Normal sinus rhythm at 71 bpm.  Nonspecific T changes.  Normal intervals.  No ectopy.  October 2016 ECG (independently read by me): Normal sinus rhythm at 62 bpm.  No ectopy.  Nonspecific T-wave changes.  December 2016 ECG (independently read by me):  Sinus bradycardia 58 bpm.   Nonspecific T changes.  ECG (independently read by me): Normal sinus rhythm at 62 bpm.  Normal intervals.  QTc interval 440 ms.  No significant ST segment changes.  June 2015 ECG (independently read by me): Sinus rhythm at 56 beats per minute.  Nonspecific ST changes.  LABS:  BMP Latest Ref Rng & Units 03/19/2017 10/07/2016 04/26/2015  Glucose 65 - 99 mg/dL 101(H) 128(H) 148(H)  BUN 6 - 20 mg/dL 10 16 16   Creatinine 0.44 - 1.00 mg/dL 0.72 0.77 0.73  Sodium 135 - 145 mmol/L 140 141 141  Potassium 3.5 - 5.1 mmol/L 3.8 4.0 3.8  Chloride 101 - 111 mmol/L 107 108 106  CO2 22 - 32 mmol/L 23 23 27   Calcium 8.9 - 10.3 mg/dL 8.9 9.2 8.9   Hepatic Function Latest Ref Rng & Units 03/19/2017 05/10/2009  Total Protein 6.5 - 8.1 g/dL 6.4(L) 5.9(L)  Albumin 3.5 - 5.0 g/dL 3.9 3.4(L)  AST 15 - 41 U/L 21 19  ALT 14 - 54 U/L 24 22  Alk Phosphatase 38 - 126 U/L 75 70  Total Bilirubin 0.3 - 1.2 mg/dL 0.6 0.4   CBC Latest Ref Rng & Units 08/08/2017 03/19/2017 10/07/2016  WBC 4.0 - 10.5 K/uL 5.8 5.2 6.0  Hemoglobin 12.0 - 15.0 g/dL 14.4 13.0 13.7  Hematocrit 36.0 - 46.0 % 43.0 39.0 39.5  Platelets 150.0 - 400.0 K/uL 250.0 193 230   Lab Results  Component Value Date   MCV 91.7 08/08/2017   MCV 92.6 03/19/2017   MCV 88.6 10/07/2016    Lab Results  Component Value Date   TSH 0.735  07/02/2011   No results found for: HGBA1C   Lipid Panel  No results found for: CHOL, TRIG, HDL, CHOLHDL, VLDL, LDLCALC, LDLDIRECT   RADIOLOGY: No results found.  IMPRESSION: 1. Palpitation   2. Essential hypertension   3. Frequent urination at night   4. Non-restorative sleep   5. Snoring   6. Esophageal spasm   7. Hyperlipidemia LDL goal <70   8. Mild aortic stenosis   9. Intermittent asthma without complication, unspecified asthma severity     ASSESSMENT AND PLAN: Ms. Elice Crigger is a 72 year-old female with previously documented mild CAD involving her left circumflex coronary artery in addition to  systolic bridging of the mid LAD noted at cardiac catheterization in February 2008. A nuclear perfusion study in March 2014 showed normal perfusion.She developed recurrent chest pain symptomatology in December 2016 and repeat nuclear perfusion study remained entirely normal.  Has been felt to have esophageal spasm to her chest pain as result of her lower esophageal stricture.  She was evaluated at [facility].  Her last echo Doppler study has shown normal systolic function with an EF of 60 to 86%, grade 1 diastolic dysfunction, and mild aortic stenosis with trivial AR.  She admits to occasional palpitations and in the past a 24-hour monitor has shown short bursts of SVT, PACs and PVCs.  She recently has noticed some palpitations more often at night.  Her sleep history is suggestive of possible obstructive sleep apnea which may be a contributing factor.  I discussed the importance of avoidance of chocolate which contains caffeine.  I have recommended discontinuance of amlodipine and in its place will initiate Cardizem CD 240 mg.  This should be helpful for blood pressure as well as potential reduction in palpitations.  I am scheduling her for sleep study to evaluate for obstructive sleep apnea.  She is on low-dose levothyroxine for mild hypothyroidism.  She is on Singulair and Dulera in addition to [place] for her asthma.  She is on Vytorin 10/20 for hyperlipidemia and is tolerating this well.  I will see her in 3 to 4 months for follow-up evaluation.    Time spent: 30 minutes Troy Sine, MD, [facility]  12/26/2017 7:39 PM

## 2017-12-20 NOTE — Telephone Encounter (Signed)
1 prefilled syringe Ordered Date: 12/20/17 Shipping Date: 12/21/17

## 2017-12-21 ENCOUNTER — Other Ambulatory Visit: Payer: Self-pay | Admitting: Internal Medicine

## 2017-12-22 NOTE — Telephone Encounter (Signed)
1 prefilled syringe Arrival Date: 12/22/17 Lot #:[id number] Exp date: 04/2019

## 2017-12-25 ENCOUNTER — Ambulatory Visit (INDEPENDENT_AMBULATORY_CARE_PROVIDER_SITE_OTHER): Payer: Medicare Other

## 2017-12-25 DIAGNOSIS — J4550 Severe persistent asthma, uncomplicated: Secondary | ICD-10-CM | POA: Diagnosis not present

## 2017-12-25 MED ORDER — DUPILUMAB 300 MG/2ML ~~LOC~~ SOSY
300.0000 mg | PREFILLED_SYRINGE | Freq: Once | SUBCUTANEOUS | Status: AC
  Administered 2017-12-25: 300 mg via SUBCUTANEOUS

## 2017-12-26 ENCOUNTER — Encounter: Payer: Self-pay | Admitting: Cardiovascular Disease

## 2018-01-08 ENCOUNTER — Ambulatory Visit (INDEPENDENT_AMBULATORY_CARE_PROVIDER_SITE_OTHER): Payer: Medicare Other

## 2018-01-08 DIAGNOSIS — J4550 Severe persistent asthma, uncomplicated: Secondary | ICD-10-CM | POA: Diagnosis not present

## 2018-01-08 MED ORDER — DUPILUMAB 300 MG/2ML ~~LOC~~ SOSY
300.0000 mg | PREFILLED_SYRINGE | Freq: Once | SUBCUTANEOUS | Status: AC
  Administered 2018-01-08: 300 mg via SUBCUTANEOUS

## 2018-01-16 ENCOUNTER — Other Ambulatory Visit: Payer: Self-pay | Admitting: Internal Medicine

## 2018-01-17 ENCOUNTER — Telehealth: Payer: Self-pay | Admitting: Internal Medicine

## 2018-01-17 NOTE — Telephone Encounter (Signed)
1 prefilled syringe Ordered Date: 01/17/18 Shipping Date: 01/18/18

## 2018-01-19 ENCOUNTER — Telehealth: Payer: Self-pay | Admitting: Internal Medicine

## 2018-01-19 NOTE — Telephone Encounter (Signed)
1 prefilled syringe Arrival Date: 01/19/18 Lot #:[id number] Exp date: 08/2019

## 2018-01-22 ENCOUNTER — Ambulatory Visit (INDEPENDENT_AMBULATORY_CARE_PROVIDER_SITE_OTHER): Payer: Medicare Other

## 2018-01-22 DIAGNOSIS — J4550 Severe persistent asthma, uncomplicated: Secondary | ICD-10-CM

## 2018-01-22 MED ORDER — DUPILUMAB 300 MG/2ML ~~LOC~~ SOSY
300.0000 mg | PREFILLED_SYRINGE | Freq: Once | SUBCUTANEOUS | Status: AC
  Administered 2018-01-22: 300 mg via SUBCUTANEOUS

## 2018-01-22 NOTE — Progress Notes (Signed)
Documentation of medication administration and charges of Dupxient have been completed by Olin Gurski, CMA based on the hand written Dupxient documentation sheet completed by Tammy Scott, who administered the medication.  

## 2018-01-23 NOTE — Telephone Encounter (Signed)
I created another encounter for the arrival of her [place]. Done. Usually the pt. Orders the med., so there's no need for an order. Nothing further needed.

## 2018-02-05 ENCOUNTER — Ambulatory Visit (INDEPENDENT_AMBULATORY_CARE_PROVIDER_SITE_OTHER): Payer: Medicare Other

## 2018-02-05 ENCOUNTER — Ambulatory Visit: Payer: Medicare Other

## 2018-02-05 DIAGNOSIS — J4550 Severe persistent asthma, uncomplicated: Secondary | ICD-10-CM | POA: Diagnosis not present

## 2018-02-05 MED ORDER — DUPILUMAB 300 MG/2ML ~~LOC~~ SOSY
300.0000 mg | PREFILLED_SYRINGE | Freq: Once | SUBCUTANEOUS | Status: AC
  Administered 2018-02-05: 300 mg via SUBCUTANEOUS

## 2018-02-05 NOTE — Progress Notes (Signed)
Documentation of medication administration and charges of Dupxient have been completed by Desmond Dike, CMA based on the hand written Dupxient documentation sheet completed by Alroy Bailiff, who administered the medication.

## 2018-02-19 ENCOUNTER — Ambulatory Visit: Payer: Medicare Other

## 2018-02-21 ENCOUNTER — Telehealth: Payer: Self-pay | Admitting: Internal Medicine

## 2018-02-21 NOTE — Telephone Encounter (Signed)
1 prefilled syringe Arrival Date: 02/21/18 Lot #:[id number] Exp date: 01/2020

## 2018-02-26 ENCOUNTER — Telehealth: Payer: Self-pay | Admitting: Internal Medicine

## 2018-02-26 ENCOUNTER — Ambulatory Visit (INDEPENDENT_AMBULATORY_CARE_PROVIDER_SITE_OTHER): Payer: Medicare Other

## 2018-02-26 DIAGNOSIS — J4550 Severe persistent asthma, uncomplicated: Secondary | ICD-10-CM

## 2018-02-26 NOTE — Telephone Encounter (Signed)
Returned pt's call. Pt is on Dupixent, I made an appt. For her to come in this afternoon at 2:30. Nothing further needed.

## 2018-02-28 MED ORDER — DUPILUMAB 300 MG/2ML ~~LOC~~ SOSY
300.0000 mg | PREFILLED_SYRINGE | Freq: Once | SUBCUTANEOUS | Status: AC
  Administered 2018-02-26: 300 mg via SUBCUTANEOUS

## 2018-03-01 ENCOUNTER — Encounter: Payer: Self-pay | Admitting: Internal Medicine

## 2018-03-01 ENCOUNTER — Ambulatory Visit (INDEPENDENT_AMBULATORY_CARE_PROVIDER_SITE_OTHER): Payer: Medicare Other | Admitting: Internal Medicine

## 2018-03-01 VITALS — BP 122/76 | HR 77 | Ht 62.0 in | Wt 151.6 lb

## 2018-03-01 DIAGNOSIS — H538 Other visual disturbances: Secondary | ICD-10-CM

## 2018-03-01 DIAGNOSIS — J82 Pulmonary eosinophilia, not elsewhere classified: Secondary | ICD-10-CM

## 2018-03-01 DIAGNOSIS — J4550 Severe persistent asthma, uncomplicated: Secondary | ICD-10-CM

## 2018-03-01 DIAGNOSIS — J8283 Eosinophilic asthma: Secondary | ICD-10-CM

## 2018-03-01 NOTE — Patient Instructions (Addendum)
ICD-10-CM   1. Severe persistent asthma, unspecified whether complicated H88.87 Nitric oxide  2. 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 Blurred vision H53.8    #asthma = well controlled and significantly improved after he started biologic -Continue Dupixent every 2 weeks -At the moment continue both Singulair and Dulera on a scheduled basis -Albuterol as needed -You are up-to-date with your pulmonary vaccines -At follow-up I can consider de-escalating some of your inhaler/oral agents for asthma -Call sooner if you have any evidence of asthma exacerbation  Asthma -Understand this is temporally correlated with starting Ventana -Review of the side effect profile shows between 2 and 10% chance of conjunctivitis or itchy eyes -but I do not see blurred vision reported -Please follow this with your eye doctor and neurologist -For the moment we will assume this is not because of Dupixent but we will be open to that possibility as we follow  #Follow-up 3-6 months or sooner if needed; ACQ and exam nitric oxide at follow-up

## 2018-03-01 NOTE — Progress Notes (Addendum)
OV 11/30/2016  Chief Complaint  Patient presents with  . Follow-up    Pt here after acute visit with TP on 8.31.2018. Pt states she is feeling improved but not back to baseline. Pt states she is still having prod cough with pale yellow mucus, still some SOB. Pt denies CP/tightness, f/c/s.      72 year old female with moderate persistent asthma. Last seen 11/25/2013 by her practitioner. Given prednisone and Z-Pak. Currently she is improved. Pulmonary function test yesterday shows normalized FEV1 and DLCO. Nitric oxide was high 3 weeks ago. She is here with [name]. Her son has graduated from anesthesia residency at the [employer]. She is worried about repeated it takes of steroids. She admitted to noncompliance with Symbicort and Singulair up until May 2018. She is also worried about her monoclonal antibody infusion for multiple sclerosis which she gets every 6 months for the last year or 2. She says after that she gets extremely fatigued and might require a prednisone burst. Occasionally she is not a respiratory infection. However she's been advised by her neurologist to continue with these injections in order to support her multiple sclerosis. There are no other new issues. She had a 7 mm lung nodule that resolved a year ago  72 year old female never smoker followed for moderate persistent asthma and lung nodule. MS on Infusion -OCREVUS every 6 months  Son is an anesthesiologist   01/13/2017 Follow up : Asthma  Pt returns for follow up for Asthma . She was seen on 12/27/16 for asthma bronchitic flare with 3 week hx of productive cough with thick yellow mucus and wheezing . Her FENO testing was elevated . She was treated with Zpack and Prednisone taper. Says she did get better with less cough, congestion and wheezing . She is concerned because she gets intermittent wheezing especially in am. Goes away after she uses Symbicort.  No fever, discolored mucus, chest pain,  orthopnea,.  She remains on Symbicort, Singulair and Zyrtec. Says she is compliant .  She says she and family are concerned regarding steroids use. We went over steroid use and potential side effects and goal is to limit use as much as possible . Last ov Nucala was ordered and is under approval process . This was added to help with improved asthma control .  She is concerned for adrenal insufficiency , discussed that it can occur with prolonged steroid use. She has been on three 1 week steroid tapers over last 4 months . Suggested she discuss this with PCP to have additional testing or referral to endocrinology if indicated.  FENO testing today is slightly improved at 150 .     Acute 01/27/17 72 year old with history of persistent asthma, multiple sclerosis on  complains of increasing dyspnea for the past 1 week associated with yellow mucus, wheezing, chest tightness.  She denies any nebulizer but has not received them yet.  Paperwork is underway for initiation of nucala but has not been started yet.   She has significant history of GERD with esophageal spasm, atypical chest pain.  She follows at [facility] for this.  She is taking Protonix alternating with Zyrtec.     OV 02/08/2017  Chief Complaint  Patient presents with  . Follow-up    Pt last seen by PM on 11.2.2018 for an acute visit. Pt states she started to imporve but now is feeling like she is catching a cold again. Pt c/o prod cough with yellow mucus. Pt deniee SOB,  CP/tihgtness and f/c/s.      72 year old female with eosinophilic and allergic asthma.  Presents for follow-up.  Most recently seen January 27, 2017 by my colleague for asthma exacerbation.  She is on her last day of prednisone today.  She feels improved.  Her nitric oxide has been the lowest it has been in 2018 which is 115 ppb but still significantly elevated.  She feels she is catching a cold although she feels overall better.  She is on Symbicort schedule and Singulair  scheduled.  She is not on any inhaled anticholinergic.  She is awaiting her interleukin-5 receptor antibody treatment.  She is worried about taking this because of cross interference with the other monoclonal antibody that she takes for her multiple sclerosis.  We had a discussion on this.  I did tell her that the cross-reactive and side effect profile is unknown and the general unpredictability can be higher.  Despite this we thought that the overall risk benefit ratio is in favor of her taking her interleukin-5 receptor antibody for asthma.   TEST  CT chest 01/2016 7 mm nodule resolved, stable other nodules , improved tree in bud nodularity  09/2016 IgE 4, neg RAST  09/2016 Eosinophils 300-400  Feno >.150 10/2016 >187 12/27/16  PFT 11/2016 >nml  FEV 1 , no restriction or obstruction , nml DLCO   OV 04/19/2017  Chief Complaint  Patient presents with  . Follow-up    has had 2 ED visit in December 2018 with asthma/bronchitis,no admission,feels like breathing at this time is better,she can tell improvement   Follow-up allergic asthma with eosinophilic asthma but normal IgE  She is here with [name] who runs a daycare for after school kids at [facility].  Her son is no longer at the [employer] but is now working in [place].  She tells me currently her asthma is stable.  She and her daughter are very categorical that the whole asthma started after she started taking monoclonal antibody infusions for her multiple sclerosis.   there is an infusion administered over 6 hours every 6 months.  She first started this infusion around November 2017.  She tells me that she has infusion reactions that start within a day or so of receiving the infusion.  These infusion reactions are characterized by body ache and fever and aggravation of past medical issues such as the meniscus.  She also has throat irritation and bronchospasm.  Then some 2 weeks later like clockwork she gets acute bronchitis which  is then treated as an asthma exacerbation.  She also has significant fatigue.  She says it takes her months to recover from this and she has a normal good 5 months before the cycle is repeated again.  Most recently the infusion was in November 2018 and then she ended up in the ER for asthma exacerbation in December 2018.  She was supposed to start interleukin-5 receptor antibody for asthma that was poorly controlled and because of persistent eosinophilia and high nitric oxide but given the cycle she and her daughter have decided against Nucala especially because she is convinced it is the Gilbert for multiple sclerosis that is causing this problem.  She is going to skip her next multiple sclerosis infusion in May 2019.  At this point in time she is compliant with her Symbicort.  Med review shows coreg   OV 05/10/2017  Chief Complaint  Patient presents with  . Acute Visit    Pt has  complaints of coughing with thick yellow mucus and occ. dark brown mucus along with wheezing and hoarseness. Pt denies any fever.   76 -year-old female with asthma eosinophilic and moderate persistent on Symbicort.  She presents for an acute visit.  Since seeing me 3 weeks ago she had gradual deterioration in symptoms.  She is having significant nocturnal symptoms.  Her friends are telling her that her voice is hoarse.  She says she is compliant with her Symbicort and Singulair.  Her interleukin-5 receptor antibody is on hold because she is worried about interactions with the monoclonal antibody for multiple sclerosis still in her system and the washout of that is expected only June 2019.  Currently her asthma control questionnaire shows a score of 3.6 showing significant symptoms.  She is waking up a few times at night because of asthma symptoms and when she wakes up she has moderate symptoms.  She is moderately limited in her activities because of asthma.  She is short of breath quite a bit and wheezing all the time although  she hold off on using her albuterol for rescue.  Med review shows that she is on nonspecific beta blocker carvedilol.  There is no fever or chills.    OV 06/08/2017  Chief Complaint  Patient presents with  . Follow-up    Pt has had problems with side effects from the spiriva due to eye problems and dry mouth.  Breo and spiriva had helped with the wheezing but due to side effects, meds were stopped x2 days. States she had a coughing fit with the meds as well. Pt has c/o cough with yellow mucus, SOB, and is back to wheezing.   72 year old female with asthma eosinophilic and moderate persistent.  At last visit she was in acute visit.  I added Spiriva to her regimen.  Switch to Symbicort 2 higher dose Brio.  I asked her to continue her Singulair.  She still was not interested in her biologic therapy given her issues doing biologic therapy with multiple sclerosis.  We also had her talk to her physician and get her nonspecific beta-blocker carvedilol switched out.  She did all this.  However she tells me that Spiriva is causing dry mouth and she stopped it like a week ago.  For some unclear reason she also stopped the Brio for a few days a week ago and then again for a few days earlier this week and then starting yesterday or like 2 days ago started having worsening asthma symptoms.  She is now reporting significant cough.  When she wakes up she has mild symptoms when she is active she is very slightly limited because of her asthma.  She is short of breath a moderate amount because of the asthma and is wheezing a moderate amount because of the asthma and in the last 2 days most of the time and she is now using nebulizer for rescue 2 times daily.  Average asthma control question as 2.2.  Her exact nitric oxide test continues to be elevated at 156 as always.  She is now accepting that she will have to go through biologic therapy.  Her daughter status with her.  The questioning alternative etiologies.  She tells me  that she might have mold in the house.  Review of the labs show she had normal allergy blood test in July 2018 and a clear chest x-ray in December 2018 [CT chest in November showed tree-in-bud in the right upper lobe 1 month earlier]  IgE was normal back in July 2018.  Aspergillus panel never checked.   07/06/2017  Pt. Was seen for an acute flare 06/08/2017 after stopping her Breo. At that visit, Dr. Golden Pop plan was as follows: Plan Retest for ABPA             - check IgE, aspergillus antibody blood test , do CXR Take prednisone 40 mg daily x 2 days, then 20mg  daily x 2 days, then 10mg  daily x 2 days, then 5mg  daily x 2 days and stop Start dulera high dose - you need to be on inhaled steroids No spiriva for you Continue singulair Glad coreg got changed out Start Nucala starting next week FENO at this visit was 156 ppb  Followup 4-6 weeks or sooner if needed with myself or APP             - feno and ACQ at follow up  Pt. Presents today . She states she completed he prednisone taper . She has been compliant with her Dulera, Singulair and Zyrtec. She has not had to use her rescue inhaler. She is not coughing. She states maybe once daily. Her cough is productive for yellow secretions on the occasion she does cough. She has had 1 Nucala injection.She did have some fatigue 1 week after the injection. It lasted x 1 day. She did have some mild arm tenderness one week after the injection. She does feel she is better than she was last month.She denies fever, chest pain, orthopnea or hemoptysis.    OV 08/08/2017  Chief Complaint  Patient presents with  . Follow-up    Pt states she is coughing up a lot of yellow mucus in the mornings. Other than the cough, pt states she has been doing good.    [name] presents for follow-up.  She has severe eosinophilic asthma that is poorly controlled with persistently significant high elevated nitric oxide levels  I personally saw her in mid March  2019.  At that time I recommended starting nucala.  She is now had 2 doses 4 weeks apart of that same injection.  Today is the third dose.  She says for the last few weeks she is noticing increased sputum production and loosening of the sputum and a yellow color in the sputum.  She feels that it is because the injection is working.  She does not feel it is the asthma exacerbation.  Her exam nitric oxide continues to be very high at 162 today.  She says she is compliant with her Dulera.  In terms of her multiple sclerosis she is coming off Ocrivis and going to sart on TYSABRI.  the care for this is being coordinated by Dr. Gorden Harms.  And apparently the neurologist was wondering about she says that between the last visit in this current visit she got her immunoglobulin checked by him and all these levels were low the correlation of that and asthma.  With in our labs we do notice that the IgE levels are low.  Currently she is not running any fever or chills or orthopnea or active wheezing   OV 10/16/2017  Chief Complaint  Patient presents with  . Follow-up    Pt states she has been doing well since last OV with Wyn Quaker, NP but states she is still coughing up phlegm.    [name] presents for follow-up.  She has severe persistent asthma with eosinophila asthma that is poorly controlled with persistently significant high elevated nitric oxide  levels usually > 150. On Nucala since mid-march 2019   Since I saw her last 2 months ago she's had an exacerbation requiring prednisone. She continues on interleukin-5 receptor antibody subcutaneous injections every 4 weeks since mid March 2019. Currently she feels better than before asthma control questionnaire is1.2 and is on the better score she's had over time.However nitric oxide test is significantly elevated and worse and now in the 200s. She is very puzzled by this. She denies anyone infestation. Most recent eosinophil count to show improvement. But she  still overall very symptomatic and still requiring prednisone. We discussed the switch to another biologic dupulimab and she is open to this idea    OV 11/30/2017  Subjective:  Patient ID: [name], female , DOB: 07-Jun-1945 , age 39 y.o. , MRN: [id number] , ADDRESS: [place] [facility] [id number]   11/30/2017 -   Chief Complaint  Patient presents with  . Follow-up    Pt states she has been doing good since last visit except states she has had a headache x2 days and has had a cough. Pt also has c/o chest tightness.     HPI [name] 72 y.o. -follow-up severe persistent asthma with poorly controlled symptoms. She is on Dulera, Singulair and biologic Nucala. She reports that currently it is one of her better days and weeks. Despite that asthma control questionnaire shows significant symptom with the 5 point score of 1.8. She says she is not waking up in the middle of the night because of asthma when she wakes up she is very mild symptoms when she is very slightly limited in her activities and she is moderately short of breath and wheezing a lot of the time but not using much albuterol for rescue. This is despite compliance. Last visit we switched her to Yetter but this is stuck with insurance pre-authorization required and that is some delays with this. In addition she also tells me that at every visit she's been giving sputum samples at our lab basementand she is unclear why no one called her with results. I have told her that I was unaware that this was going on. Review of the charts indicate sputum samples given October 2018 in April 2019. Along and consistent with a respiratory in asthma symptoms at that early morning she has cough with yellow sputum which is consistent with high airway eosinophil load as evidenced by significantly high exhaled nitric oxide is despite biologic therapy.  [name]  ROS - per HPI       OV 03/01/2018  Subjective:  Patient ID: [name],  female , DOB: [birth year] , age 3 y.o. , MRN: [id number] , ADDRESS: [place] [facility] [id number]   03/01/2018 -   Chief Complaint  Patient presents with  . Follow-up    f/u asthma, no wheezing since dupixent     HPI [name] 72 y.o. -  Follow-up severe eosinophilic asthma on Dupixent.  She is taking the shots in her arm every 2 weeks.  This is been going on for 8 weeks or so.  She reports excellent improvement in her asthma.  Asthma control questionnaire 0 out of 5.  Significant improvement in her exam nitric oxide today going from the mid 100s to 43.  She continues to be on Dulera and Singulair.  She is asking if she can de-escalate any of this therapy.  She is not waking up in the middle of the night with asthma.  She not having any symptoms when she wakes up.  Not limited in her activities.  When she wakes up she not short of breath.  No wheezing.  She not using albuterol for rescue.  She thinks she might be getting a cold but she is not sure.  Nitric oxide and symptom profile shows huge improvements.  However she is wondering if she is having side effects from the dupilumab.  She is reporting some blurred vision and needing to use reading glasses more so after starting the dupilumab.  This is temporally correlated.  Review of the literature shows conjunctivitis and pruritus reported at low percentages but not blurred vision.       Ref. Range 10/07/2016 12:29 05/10/2017  06/08/2017 Start nucala 08/08/2017  10/16/2017   11/30/2017  03/01/2018 On dupixemen  Nitric Oxide Unknown 180 153 156 162 237 158 43  ACQ score   3.6   2.2  1.2 1.8 0     Ref. Range 05/10/2009 04:32 10/07/2016 13:11 10/07/2016 20:51 03/19/2017 15:45 08/08/2017 11:32  Eosinophils Absolute Latest Ref Range: 0.0 - 0.7 K/uL 0.1 0.4 0.3 0.3 0.0    Ref. Range 10/07/2016 13:11  IgEcoming (Immunoglobulin E), Serum Latest Ref Range: <115 kU/L 4    ROS - per HPI     has a past medical history of Anemia, Anginal  pain ([place]), Arthritis, Complication of anesthesia, Coronary artery disease, Dry eyes, Esophageal spasm, GERD (gastroesophageal reflux disease), and MS (multiple sclerosis) ([place]).   reports that she has never smoked. She has never used smokeless tobacco.  Past Surgical History:  Procedure Laterality Date  . ABDOMINAL HYSTERECTOMY    . BLEPHAROPLASTY Bilateral   . CARDIAC CATHETERIZATION  10/04/06   MINOR CAD,SINGLE VESSEL INVOLVING THE CIRCUMFLEX. 20 TO 30% PROXIMALLY AND 10 TO 20% IN THE MIDDLE SEGMENT.MILD MUSCLE BRIDGING, MID LAD.NORMAL RCA.NORMAL LV FUNCTION.NORMAL MITRAL AND AORTIC VALVE.NORMAL APPEARING AORTA,THORACIC AND ABDOMINAL.NORMAL RENAL ARTERIES.  [name] CARDIOLOGY NUCLEAR MED STUDY  06/22/12   NL LV FUNCTION,EF 68%,NL WALL MOTION.  [name] CAROTID DUPLEX  07/02/11   BMW:UXLK SOFT PLAQUE NOTED DISTAL CCA AND ORGIN AND PROXIMAL ICA,LEFT>RIGHT.NO ICA STENOSIS. VERTEBRAL ARTERY FLOW IS ANTEGRADE.  [name] CESAREAN SECTION     x2   . COLONOSCOPY WITH PROPOFOL N/A 12/11/2014   Procedure: COLONOSCOPY WITH PROPOFOL;  Surgeon: Ronald Lobo, MD;  Location: [facility];  Service: Endoscopy;  Laterality: N/A;  . KNEE ARTHROSCOPY Left    scope  . PARATHYROIDECTOMY     partial-many years ago  . thumb surgery Bilateral    built up and bone removal  . TONSILLECTOMY    . TRANSTHORACIC ECHOCARDIOGRAM  07/02/11   LV CAVITY SIZE IS NORMAL. SYSTOLIC FUNCTION WAS NORMAL.EF=55% TO 60%.INCREASED RELATIVE CONTRIBUTION OF ATRIAL CONTRACTION TO VENTRICULAR FILLING;MAYBE DUE TO HYPOVOLEMIA. AV=MILD REGURG.    Allergies  Allergen Reactions  . Crestor [Rosuvastatin] Other (See Comments)    Joint pain   . Iodinated Diagnostic Agents Other (See Comments)    Sneezing and itchy throat; dye was Isovue 300  . Vicodin [Hydrocodone-Acetaminophen]     Made pass-out one time and is okay taking now  . Isovue [Iopamidol]     Pt had sneezing and itching of her throat and soft palate.  Dr Alvester Chou checked pt.  She will need premeds  in the future.  J Bohm    Immunization History  Administered Date(s) Administered  . Influenza, High Dose Seasonal PF 12/22/2013, 12/14/2015, 12/14/2016, 11/30/2017  . Influenza,inj,Quad PF,6+ Mos 11/27/2014  . Pneumococcal Polysaccharide-23  10/26/2013  . Pneumococcal-Unspecified 10/26/2013  . Tdap 06/14/2011  . Zoster Recombinat (Shingrix) 11/13/2017    Family History  Problem Relation Age of Onset  . Heart disease Mother   . Asthma Father   . Bladder Cancer Father   . Heart failure Father   . Hyperlipidemia Brother      Current Outpatient Medications:  .  albuterol (PROVENTIL HFA;VENTOLIN HFA) 108 (90 Base) MCG/ACT inhaler, Inhale 1-2 puffs into the lungs every 6 (six) hours as needed for wheezing or shortness of breath., Disp: 1 Inhaler, Rfl: 5 .  aspirin EC 81 MG tablet, Take 1 tablet (81 mg total) by mouth daily., Disp: 90 tablet, Rfl: 3 .  calcium-vitamin D (OSCAL WITH D) 500-200 MG-UNIT tablet, Take 1 tablet by mouth daily., Disp: , Rfl:  .  cetirizine (ZYRTEC ALLERGY) 10 MG tablet, Take 1 tablet (10 mg total) by mouth daily., Disp: 30 tablet, Rfl: 5 .  Cholecalciferol (VITAMIN D PO), Take 1 tablet by mouth daily., Disp: , Rfl:  .  CINNAMON PO, Take 1 tablet by mouth daily., Disp: , Rfl:  .  diazepam (VALIUM) 2 MG tablet, , Disp: , Rfl:  .  diltiazem (CARDIZEM CD) 240 MG 24 hr capsule, Take 1 capsule (240 mg total) by mouth daily., Disp: 90 capsule, Rfl: 3 .  DULERA 200-5 MCG/ACT AERO, USE 2 PUFFS TWICE DAILY., Disp: 13 g, Rfl: 3 .  isosorbide mononitrate (IMDUR) 60 MG 24 hr tablet, TAKE 1 TABLET ONCE DAILY., Disp: 30 tablet, Rfl: 11 .  levothyroxine (SYNTHROID, LEVOTHROID) 25 MCG tablet, Take 25 mcg daily before breakfast by mouth., Disp: , Rfl:  .  losartan (COZAAR) 50 MG tablet, , Disp: , Rfl:  .  montelukast (SINGULAIR) 10 MG tablet, TAKE ONE TABLET AT BEDTIME., Disp: 30 tablet, Rfl: 3 .  Multiple Vitamin (MULTIVITAMIN WITH MINERALS) TABS tablet, Take 1 tablet by mouth  daily., Disp: , Rfl:  .  NITROSTAT 0.4 MG SL tablet, PLACE 1 TAB UNDER THE TONGUE AS NEEDED FOR CHEST PAIN MAY REPEAT EVERY 5 MINUTES., Disp: 25 tablet, Rfl: 3 .  pantoprazole (PROTONIX) 20 MG tablet, TAKE 1 TABLET BY MOUTH TWICE DAILY., Disp: 90 tablet, Rfl: 3 .  PARoxetine (PAXIL) 10 MG tablet, Take 5 mg by mouth daily. , Disp: , Rfl:  .  Respiratory Therapy Supplies (FLUTTER) DEVI, Use as directed, Disp: 1 each, Rfl: 0 .  VYTORIN 10-20 MG per tablet, Take 1 tablet by mouth daily. , Disp: , Rfl:   Current Facility-Administered Medications:  [name]  Mepolizumab SOLR 100 mg, 100 mg, Subcutaneous, Q28 days, Brand Males, MD, 100 mg at 06/14/17 0917 .  Mepolizumab SOLR 100 mg, 100 mg, Subcutaneous, Q28 days, Brand Males, MD, 100 mg at 07/12/17 0902 .  Mepolizumab SOLR 100 mg, 100 mg, Subcutaneous, Q28 days, Brand Males, MD, 100 mg at 10/31/17 3810      Objective:   There were no vitals filed for this visit.  Estimated body mass index is 26.78 kg/m as calculated from the following:   Height as of 12/20/17: 5\' 3"  (1.6 m).   Weight as of 12/20/17: 151 lb 3.2 oz (68.6 kg).  @WEIGHTCHANGE @  There were no vitals filed for this visit.   Physical Exam  General Appearance:    Alert, cooperative, no distress, appears stated age - yes , Deconditioned looking - no , OBESE  - no, Sitting on Wheelchair -  no  Head:    Normocephalic, without obvious abnormality, atraumatic  Eyes:  PERRL, conjunctiva/corneas clear,  Ears:    Normal TM's and external ear canals, both ears  Nose:   Nares normal, septum midline, mucosa normal, no drainage    or sinus tenderness. OXYGEN ON  - no . Patient is @ ra   Throat:   Lips, mucosa, and tongue normal; teeth and gums normal. Cyanosis on lips - no  Neck:   Supple, symmetrical, trachea midline, no adenopathy;    thyroid:  no enlargement/tenderness/nodules; no carotid   bruit or JVD  Back:     Symmetric, no curvature, ROM normal, no CVA tenderness    Lungs:     Distress - no , Wheeze no, Barrell Chest - no, Purse lip breathing - no, Crackles - no   Chest Wall:    No tenderness or deformity.    Heart:    Regular rate and rhythm, S1 and S2 normal, no rub   or gallop, Murmur - no  Breast Exam:    NOT DONE  Abdomen:     Soft, non-tender, bowel sounds active all four quadrants,    no masses, no organomegaly. Visceral obesity - no  Genitalia:   NOT DONE  Rectal:   NOT DONE  Extremities:   Extremities - normal, Has Cane - no, Clubbing - no, Edema - no  Pulses:   2+ and symmetric all extremities  Skin:   Stigmata of Connective Tissue Disease - no  Lymph nodes:   Cervical, supraclavicular, and axillary nodes normal  Psychiatric:  Neurologic:   Pleasant - yes, Anxious - no, Flat affect - no  CAm-ICU - neg, Alert and Oriented x 3 - yes, Moves all 4s - yes, Speech - normal, Cognition - intact           Assessment:     ICD-10-CM   1. Severe persistent asthma, unspecified whether complicated V95.63 Nitric oxide  2. 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 Blurred vision H53.8        Plan:     Patient Instructions     ICD-10-CM   1. Severe persistent asthma, unspecified whether complicated I43.32 Nitric oxide  2. 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 Blurred vision H53.8    #asthma = well controlled and significantly improved after he started biologic -Continue Dupixent every 2 weeks -At the moment continue both Singulair and Dulera on a scheduled basis -Albuterol as needed -You are up-to-date with your pulmonary vaccines -At follow-up I can consider de-escalating some of your inhaler/oral agents for asthma -Call sooner if you have any evidence of asthma exacerbation  Asthma -Understand this is temporally correlated with starting [place] -Review of the side effect profile shows between 2 and 10% chance of conjunctivitis or itchy eyes -but I do not see blurred vision reported -Please follow this with your eye doctor and  neurologist -For the moment we will assume this is not because of [place] but we will be open to that possibility as we follow  #Follow-up 3-6 months or sooner if needed; ACQ and exam nitric oxide at follow-up      SIGNATURE    Dr. Brand Males, M.D., F.C.C.P,  Pulmonary and Critical Care Medicine Staff Physician, [place] Director - Interstitial Lung Disease  Program  Pulmonary [place] [place], [place], [address]  Pager: [phone], If no answer or between  15:00h - 7:00h: call [phone] Telephone: [phone]  2:56 PM 03/01/2018

## 2018-03-06 ENCOUNTER — Ambulatory Visit: Payer: Medicare Other | Admitting: Cardiovascular Disease

## 2018-03-07 ENCOUNTER — Telehealth: Payer: Self-pay | Admitting: Internal Medicine

## 2018-03-07 MED ORDER — NYSTATIN 100000 UNIT/ML MT SUSP: 5.0000 mL | Freq: Four times a day (QID) | OROMUCOSAL | 0 refills | Status: AC

## 2018-03-07 NOTE — Telephone Encounter (Signed)
Called and spoke with patient she is requesting to have something sent in for the thrush she has from her inhalers. MR please advise on this, thank you.

## 2018-03-07 NOTE — Telephone Encounter (Signed)
#   Oral thrush - For Oral thrush: Take Suspension (swish and swallow): 500,000 units 4 times/day for 5 days; swish in the mouth and retain for as long as possible (several minutes) before swallowing. IF nystatin is in back order: alternatives are  A) clotrimazole troche (throt lozenge) 10mg  dissolved and swish and swallow 5 times a day for 14 days. Or B     SIGNATURE    Dr. Brand Males, M.D., F.C.C.P,  Pulmonary and Critical Care Medicine Staff Physician, [place] Director - Interstitial Lung Disease  Program  Pulmonary [place] at [place], [place], [address]  Pager: [phone], If no answer or between  15:00h - 7:00h: call [phone] Telephone: [phone]  3:04 PM 03/07/2018

## 2018-03-07 NOTE — Telephone Encounter (Signed)
Called spoke with patient, advised MR okayed the nystatin suspension and backup options in case the nystatin is on backorder.  Pharmacy verified with patient. Rx sent. Nothing further needed; will sign off.

## 2018-03-12 ENCOUNTER — Ambulatory Visit (INDEPENDENT_AMBULATORY_CARE_PROVIDER_SITE_OTHER): Payer: Medicare Other

## 2018-03-12 DIAGNOSIS — J4550 Severe persistent asthma, uncomplicated: Secondary | ICD-10-CM

## 2018-03-16 MED ORDER — DUPILUMAB 300 MG/2ML ~~LOC~~ SOSY
300.0000 mg | PREFILLED_SYRINGE | Freq: Once | SUBCUTANEOUS | Status: AC
  Administered 2018-03-12: 300 mg via SUBCUTANEOUS

## 2018-03-19 ENCOUNTER — Telehealth: Payer: Self-pay | Admitting: Internal Medicine

## 2018-03-19 NOTE — Telephone Encounter (Signed)
1 prefilled syringe Ordered Date: 03/19/18 Shipping Date: 03/22/18

## 2018-03-23 ENCOUNTER — Ambulatory Visit: Payer: Medicare Other | Admitting: Nurse Practitioner

## 2018-03-23 ENCOUNTER — Telehealth: Payer: Self-pay | Admitting: Internal Medicine

## 2018-03-23 ENCOUNTER — Encounter: Payer: Self-pay | Admitting: Nurse Practitioner

## 2018-03-23 VITALS — BP 128/64 | HR 77 | Ht 62.0 in | Wt 152.0 lb

## 2018-03-23 DIAGNOSIS — J4541 Moderate persistent asthma with (acute) exacerbation: Secondary | ICD-10-CM

## 2018-03-23 DIAGNOSIS — R062 Wheezing: Secondary | ICD-10-CM | POA: Diagnosis not present

## 2018-03-23 LAB — NITRIC OXIDE: NITRIC OXIDE: 15

## 2018-03-23 MED ORDER — PREDNISONE 10 MG PO TABS: ORAL_TABLET | ORAL | 0 refills | Status: DC

## 2018-03-23 NOTE — Patient Instructions (Signed)
Will order prednisone taper May take mucinex Continue current medications Follow up with [name] as scheduled or sooner if needed

## 2018-03-23 NOTE — Telephone Encounter (Signed)
1 prefilled syringe Arrival Date: 03/23/18 Lot #:[id number] Exp date: 02/2020

## 2018-03-23 NOTE — Progress Notes (Signed)
@Patient  ID: [name], female    DOB: 01/12/1946, 72 y.o.   MRN: [id number]  Chief Complaint  Patient presents with  . Wheezing    Referring provider: [name], MD  HPI 72 year old female with moderate eosinophilic and allergic asthma who is followed by [name].  Tests: CXR 09/22/17>>Mild scarring stable from the prior exam. CXR 06/08/17>> Mild atelectasis of bilateral lung bases. No focal pneumonia identified.  FENO 03/23/18>> today was 15 ppb  Dupilumab SOSY 300 mg    Date Action Dose Route User   Discharged on 03/25/2018   Admitted on 03/25/2018   02/05/2018 0940 Given by Other 300 mg Subcutaneous (Right Arm) Alroy Bailiff B    Dupilumab SOSY 300 mg    Date Action Dose Route User   Discharged on 03/25/2018   Admitted on 03/25/2018   02/26/2018 8144 Given by Other 300 mg Subcutaneous (Left Arm) Alroy Bailiff B    Dupilumab SOSY 300 mg    Date Action Dose Route User   Discharged on 03/25/2018   Admitted on 03/25/2018   03/12/2018 1019 Given by Other 300 mg Subcutaneous (Right Arm) Scott, Tammy B       OV 03/23/18 - wheezing Patient presents with wheezing. She states that symptoms started 4 days ago.  States that symptoms have progressively worsened.  She also complains of productive cough with yellow sputum.  Denies any shortness of breath.  He has been using her albuterol nebs at home with minimal relief noted.  She also states that she has some sinus congestion.  Denies any fever, chest pain, or edema.She is currently on Dupixent.  Compliant with medications.  Vital signs are stable in office today.  Her O2 sats are 95% on room air.      Allergies  Allergen Reactions  . Crestor [Rosuvastatin] Other (See Comments)    Joint pain   . Iodinated Diagnostic Agents Other (See Comments)    Sneezing and itchy throat; dye was Isovue 300  . Vicodin [Hydrocodone-Acetaminophen]     Made pass-out one time and is okay taking now  . Isovue [Iopamidol]     Pt  had sneezing and itching of her throat and soft palate.  Dr Alvester Chou checked pt.  She will need premeds in the future.  J Bohm    Immunization History  Administered Date(s) Administered  . Influenza, High Dose Seasonal PF 12/22/2013, 12/14/2015, 12/14/2016, 11/30/2017  . Influenza,inj,Quad PF,6+ Mos 11/27/2014  . Pneumococcal Polysaccharide-23 10/26/2013  . Pneumococcal-Unspecified 10/26/2013  . Tdap 06/14/2011  . Zoster Recombinat (Shingrix) 11/13/2017, 02/08/2018    Past Medical History:  Diagnosis Date  . Anemia    past history-many yrs ago  . Anginal pain ([place])    being evaluated by Dr. Tyrone Sage, arm pain,"bad indigestion" -no heart related findings as of yet  . Arthritis    hip. back pain  . Complication of anesthesia    s/p Hysterectomy "vagal response "heart stopped" -did not require shocking.  . Coronary artery disease   . Dry eyes   . Esophageal spasm   . GERD (gastroesophageal reflux disease)   . MS (multiple sclerosis) ([place])    stable-sees [name] every 6 months    Tobacco History: Social History   Tobacco Use  Smoking Status Never Smoker  Smokeless Tobacco Never Used   Counseling given: Yes   Outpatient Encounter Medications as of 03/23/2018  Medication Sig  . albuterol (PROVENTIL HFA;VENTOLIN HFA) 108 (90 Base) MCG/ACT inhaler Inhale 1-2 puffs into the  lungs every 6 (six) hours as needed for wheezing or shortness of breath.  [name] aspirin EC 81 MG tablet Take 1 tablet (81 mg total) by mouth daily.  . calcium-vitamin D (OSCAL WITH D) 500-200 MG-UNIT tablet Take 1 tablet by mouth daily.  . carbamazepine (TEGRETOL) 200 MG tablet   . cetirizine (ZYRTEC ALLERGY) 10 MG tablet Take 1 tablet (10 mg total) by mouth daily.  . Cholecalciferol (VITAMIN D PO) Take 1 tablet by mouth daily.  [name] CINNAMON PO Take 1 tablet by mouth daily.  . diazepam (VALIUM) 2 MG tablet   . DULERA 200-5 MCG/ACT AERO USE 2 PUFFS TWICE DAILY.  . DUPIXENT 300 MG/2ML SOSY   . isosorbide  mononitrate (IMDUR) 60 MG 24 hr tablet TAKE 1 TABLET ONCE DAILY.  [name] levothyroxine (SYNTHROID, LEVOTHROID) 25 MCG tablet Take 25 mcg daily before breakfast by mouth.  . losartan (COZAAR) 50 MG tablet   . montelukast (SINGULAIR) 10 MG tablet TAKE ONE TABLET AT BEDTIME.  . Multiple Vitamin (MULTIVITAMIN WITH MINERALS) TABS tablet Take 1 tablet by mouth daily.  [name] NITROSTAT 0.4 MG SL tablet PLACE 1 TAB UNDER THE TONGUE AS NEEDED FOR CHEST PAIN MAY REPEAT EVERY 5 MINUTES.  . pantoprazole (PROTONIX) 20 MG tablet TAKE 1 TABLET BY MOUTH TWICE DAILY.  [name] PARoxetine (PAXIL) 10 MG tablet Take 5 mg by mouth daily.   [name] Respiratory Therapy Supplies (FLUTTER) DEVI Use as directed  . VYTORIN 10-20 MG per tablet Take 1 tablet by mouth daily.   [name] diltiazem (CARDIZEM CD) 240 MG 24 hr capsule Take 1 capsule (240 mg total) by mouth daily.  . predniSONE (DELTASONE) 10 MG tablet Take 4 tabs for 2 days, then 3 tabs for 2 days, then 2 tabs for 2 days, then 1 tab for 2 days, then stop   Facility-Administered Encounter Medications as of 03/23/2018  Medication  . Mepolizumab SOLR 100 mg  . Mepolizumab SOLR 100 mg  . Mepolizumab SOLR 100 mg     Review of Systems  Review of Systems  Constitutional: Negative.  Negative for chills and fever.  HENT: Positive for congestion, sinus pressure and sinus pain.   Respiratory: Positive for cough, shortness of breath and wheezing.   Cardiovascular: Negative.  Negative for chest pain, palpitations and leg swelling.  Gastrointestinal: Negative.   Allergic/Immunologic: Negative.   Neurological: Negative.   Psychiatric/Behavioral: Negative.        Physical Exam  BP 128/64 (BP Location: Right Arm, Patient Position: Sitting, Cuff Size: Normal)   Pulse 77   Ht 5\' 2"  (1.575 m)   Wt 152 lb (68.9 kg)   SpO2 95%   BMI 27.80 kg/m   Wt Readings from Last 5 Encounters:  03/25/18 150 lb (68 kg)  03/23/18 152 lb (68.9 kg)  03/01/18 151 lb 9.6 oz (68.8 kg)  12/20/17 151 lb 3.2  oz (68.6 kg)  11/30/17 151 lb 3.2 oz (68.6 kg)     Physical Exam Vitals signs and nursing note reviewed.  Constitutional:      General: She is not in acute distress.    Appearance: She is well-developed.  HENT:     Nose:     Right Sinus: Maxillary sinus tenderness and frontal sinus tenderness present.     Left Sinus: Maxillary sinus tenderness present.  Cardiovascular:     Rate and Rhythm: Normal rate and regular rhythm.  Pulmonary:     Effort: Pulmonary effort is normal. No respiratory distress.     Breath  sounds: Normal breath sounds. No wheezing or rhonchi.  Musculoskeletal:        General: No swelling.  Neurological:     Mental Status: She is alert and oriented to person, place, and time.     Dg Chest 2 View  Result Date: 03/25/2018 CLINICAL DATA:  Productive cough. EXAM: CHEST - 2 VIEW COMPARISON:  Radiographs of September 22, 2017. FINDINGS: The heart size and mediastinal contours are within normal limits. Both lungs are clear. The visualized skeletal structures are unremarkable. IMPRESSION: No active cardiopulmonary disease. Electronically Signed   By: Marijo Conception, M.D.   On: 03/25/2018 15:57   Ct Head Wo Contrast  Result Date: 03/25/2018 CLINICAL DATA:  72 year old who tripped over a dog gate at home, falling forward, striking her head on a carpeted floor. Possible loss of consciousness. Dizziness and nausea since the fall. Initial encounter. EXAM: CT HEAD WITHOUT CONTRAST TECHNIQUE: Contiguous axial images were obtained from the base of the skull through the vertex without intravenous contrast. COMPARISON:  MRI brain 08/18/2017. No prior head CT. FINDINGS: Brain: Mild age-appropriate cortical atrophy. Ventricular system normal in size and appearance for age. Moderate to severe changes of small vessel disease of the white matter as noted on the prior MRI. No mass lesion. No midline shift. No acute hemorrhage or hematoma. No extra-axial fluid collections. No evidence of acute  infarction. Vascular: Mild BILATERAL carotid siphon atherosclerosis. No hyperdense vessel. Skull: No skull fracture or other focal osseous abnormality involving the skull. Sinuses/Orbits: Mucous retention cyst or polyp involving the LEFT maxillary sinus as noted on the prior MRI. Mucosal thickening involving ANTERIOR ethmoid air cells bilaterally. Remaining visualized paranasal sinuses, BILATERAL mastoid air cells and BILATERAL middle ear cavities well-aerated. Orbits and globes normal in appearance. Other: None. IMPRESSION: 1. No acute intracranial abnormality. 2. Mild age-appropriate cortical atrophy and stable moderate to severe chronic microvascular ischemic changes of the white matter. Electronically Signed   By: Evangeline Dakin M.D.   On: 03/25/2018 17:06     Assessment & Plan:   Asthma exacerbation Feno in office today was 15 ppb.  She was not wheezing in office today.  Considering symptoms will treat for asthma exacerbation.  Patient is experiencing sinus congestion as well but would like to defer antibiotics at this time.  Patient Instructions  Will order prednisone taper May take mucinex Continue current medications Follow up with [name] as scheduled or sooner if needed       Fenton Foy, NP 03/26/2018

## 2018-03-23 NOTE — Telephone Encounter (Signed)
Called and spoke to pt.  Pt stated that her wheezing had completely subsided with Dupixent but returned 4 days ago.  Pt also reports of prod cough with light yellow mucus and chest heaviness that developed 3 days ago. Sob is baseline.  Pt stated that she used albuterol neb twice with mild relief.  Pt states that MR mentioned starting prednisone if wheezing returned.  Pt requested an appt to possible do a feno to see if there is inflammation in her lungs. appt has been scheduled today with Tonya at 4:30. Nothing further is needed.   Routing to [place] as a FYI.

## 2018-03-25 ENCOUNTER — Emergency Department (HOSPITAL_BASED_OUTPATIENT_CLINIC_OR_DEPARTMENT_OTHER)
Admission: EM | Admit: 2018-03-25 | Discharge: 2018-03-25 | Disposition: A | Discharge status: Home / Self Care | Payer: Medicare Other | Attending: Emergency Medicine | Admitting: Emergency Medicine

## 2018-03-25 ENCOUNTER — Emergency Department (HOSPITAL_BASED_OUTPATIENT_CLINIC_OR_DEPARTMENT_OTHER): Payer: Medicare Other

## 2018-03-25 ENCOUNTER — Other Ambulatory Visit: Payer: Self-pay

## 2018-03-25 ENCOUNTER — Encounter (HOSPITAL_BASED_OUTPATIENT_CLINIC_OR_DEPARTMENT_OTHER): Payer: Self-pay | Admitting: Emergency Medicine

## 2018-03-25 DIAGNOSIS — J069 Acute upper respiratory infection, unspecified: Secondary | ICD-10-CM | POA: Insufficient documentation

## 2018-03-25 DIAGNOSIS — I25119 Atherosclerotic heart disease of native coronary artery with unspecified angina pectoris: Secondary | ICD-10-CM | POA: Insufficient documentation

## 2018-03-25 DIAGNOSIS — Z79899 Other long term (current) drug therapy: Secondary | ICD-10-CM | POA: Insufficient documentation

## 2018-03-25 DIAGNOSIS — R42 Dizziness and giddiness: Secondary | ICD-10-CM | POA: Diagnosis not present

## 2018-03-25 DIAGNOSIS — Z7982 Long term (current) use of aspirin: Secondary | ICD-10-CM | POA: Insufficient documentation

## 2018-03-25 DIAGNOSIS — J4541 Moderate persistent asthma with (acute) exacerbation: Secondary | ICD-10-CM | POA: Diagnosis not present

## 2018-03-25 LAB — BASIC METABOLIC PANEL
Anion gap: 9 (ref 5–15)
BUN: 17 mg/dL (ref 8–23)
CO2: 21 mmol/L — ABNORMAL LOW (ref 22–32)
Calcium: 9.2 mg/dL (ref 8.9–10.3)
Chloride: 106 mmol/L (ref 98–111)
Creatinine, Ser: 0.72 mg/dL (ref 0.44–1.00)
GFR calc Af Amer: >60 mL/min (ref >60)
GFR calc non Af Amer: >60 mL/min (ref >60)
Glucose, Bld: 108 mg/dL — ABNORMAL HIGH (ref 70–99)
POTASSIUM: 3 mmol/L — AB (ref 3.5–5.1)
SODIUM: 136 mmol/L (ref 135–145)

## 2018-03-25 LAB — CBC WITH DIFFERENTIAL/PLATELET
ABS IMMATURE GRANULOCYTES: 0.03 10*3/uL (ref 0.00–0.07)
Basophils Absolute: 0 10*3/uL (ref 0.0–0.1)
Basophils Relative: 0 %
Eosinophils Absolute: 0 10*3/uL (ref 0.0–0.5)
Eosinophils Relative: 0 %
HCT: 39 % (ref 36.0–46.0)
Hemoglobin: 12.7 g/dL (ref 12.0–15.0)
Immature Granulocytes: 0 %
Lymphocytes Relative: 14 %
Lymphs Abs: 1.3 10*3/uL (ref 0.7–4.0)
MCH: 29.7 pg (ref 26.0–34.0)
MCHC: 32.6 g/dL (ref 30.0–36.0)
MCV: 91.1 fL (ref 80.0–100.0)
Monocytes Absolute: 0.8 10*3/uL (ref 0.1–1.0)
Monocytes Relative: 9 %
NRBC: 0 % (ref 0.0–0.2)
Neutro Abs: 6.7 10*3/uL (ref 1.7–7.7)
Neutrophils Relative %: 77 %
Platelets: 193 10*3/uL (ref 150–400)
RBC: 4.28 MIL/uL (ref 3.87–5.11)
RDW: 13.2 % (ref 11.5–15.5)
WBC: 8.8 10*3/uL (ref 4.0–10.5)

## 2018-03-25 MED ORDER — IPRATROPIUM-ALBUTEROL 0.5-2.5 (3) MG/3ML IN SOLN
3.0000 mL | RESPIRATORY_TRACT | Status: AC
  Administered 2018-03-25 (×3): 3 mL via RESPIRATORY_TRACT
  Filled 2018-03-25: qty 6
  Filled 2018-03-25: qty 3

## 2018-03-25 MED ORDER — PREDNISONE 50 MG PO TABS
60.0000 mg | ORAL_TABLET | Freq: Once | ORAL | Status: AC
  Administered 2018-03-25: 60 mg via ORAL
  Filled 2018-03-25: qty 1

## 2018-03-25 MED ORDER — PROCHLORPERAZINE EDISYLATE 10 MG/2ML IJ SOLN
10.0000 mg | Freq: Once | INTRAMUSCULAR | Status: AC
  Administered 2018-03-25: 10 mg via INTRAVENOUS
  Filled 2018-03-25: qty 2

## 2018-03-25 MED ORDER — ALBUTEROL SULFATE (2.5 MG/3ML) 0.083% IN NEBU
INHALATION_SOLUTION | RESPIRATORY_TRACT | Status: AC
  Administered 2018-03-25: 5 mg
  Filled 2018-03-25: qty 6

## 2018-03-25 MED ORDER — DIPHENHYDRAMINE HCL 50 MG/ML IJ SOLN
25.0000 mg | Freq: Once | INTRAMUSCULAR | Status: AC
  Administered 2018-03-25: 25 mg via INTRAVENOUS
  Filled 2018-03-25: qty 1

## 2018-03-25 MED ORDER — MECLIZINE HCL 25 MG PO TABS: 25.0000 mg | ORAL_TABLET | Freq: Three times a day (TID) | ORAL | 0 refills | Status: DC | PRN

## 2018-03-25 MED ORDER — MECLIZINE HCL 25 MG PO TABS
25.0000 mg | ORAL_TABLET | Freq: Once | ORAL | Status: AC
  Administered 2018-03-25: 25 mg via ORAL
  Filled 2018-03-25: qty 1

## 2018-03-25 MED ORDER — POTASSIUM CHLORIDE CRYS ER 20 MEQ PO TBCR
40.0000 meq | EXTENDED_RELEASE_TABLET | Freq: Once | ORAL | Status: AC
  Administered 2018-03-25: 40 meq via ORAL
  Filled 2018-03-25: qty 2

## 2018-03-25 MED ORDER — SODIUM CHLORIDE 0.9 % IV BOLUS
1000.0000 mL | Freq: Once | INTRAVENOUS | Status: AC
  Administered 2018-03-25: 1000 mL via INTRAVENOUS

## 2018-03-25 NOTE — ED Triage Notes (Signed)
Fall last night after tripping over the dog gate. C/o L shoulder pain. Reports she has been dizzy and nauseated today. She hit her face on the floor when she fell. Also c/o cough x 4 days.

## 2018-03-25 NOTE — ED Notes (Signed)
Patient transported to X-ray 

## 2018-03-25 NOTE — ED Provider Notes (Signed)
[place] EMERGENCY DEPARTMENT Provider Note   CSN: [id number] Arrival date & time: 03/25/18  1458     History   Chief Complaint Chief Complaint  Patient presents with  . Cough  . Fall  . Dizziness    HPI [name] is a 72 y.o. female.  72 yo F with a chief complaint of dizziness.  Patient has been coughing for the past 5 days.  Has a history of asthma and has had use her inhaler more often than normal.  She tripped over something on the ground and landed on the right side of her face.  This happened yesterday.  She woke up this morning with dizziness.  Worse when she lies back flat and better when she sits up.  Usually resolves quickly after she changes head position.  Has chronic tinnitus which is unchanged.  Denies unilateral numbness or weakness denies difficulty with speech or swallowing.  The history is provided by the patient.  Cough  This is a new problem. The current episode started 2 days ago. The problem occurs constantly. The problem has not changed since onset.The cough is non-productive. There has been no fever. Associated symptoms include rhinorrhea. Pertinent negatives include no chest pain, no chills, no headaches, no myalgias, no shortness of breath, no wheezing and no eye redness. She has tried cough syrup for the symptoms. The treatment provided no relief. She is not a smoker. Her past medical history is significant for asthma.  Fall  Pertinent negatives include no chest pain, no headaches and no shortness of breath.  Dizziness  Associated symptoms: no chest pain, no headaches, no nausea, no palpitations, no shortness of breath and no vomiting     Past Medical History:  Diagnosis Date  . Anemia    past history-many yrs ago  . Anginal pain ([place])    being evaluated by Dr. Tyrone Sage, arm pain,"bad indigestion" -no heart related findings as of yet  . Arthritis    hip. back pain  . Complication of anesthesia    s/p Hysterectomy "vagal  response "heart stopped" -did not require shocking.  . Coronary artery disease   . Dry eyes   . Esophageal spasm   . GERD (gastroesophageal reflux disease)   . MS (multiple sclerosis) ([place])    stable-sees [name] every 6 months    Patient Active Problem List   Diagnosis Date Noted  . Low immunoglobulin level 08/23/2017  . Asthma exacerbation 10/07/2016  . Tightness in chest 06/01/2015  . Nodule of left lung 06/01/2015  . Pain in the chest   . Leukocytosis 04/25/2015  . Hyperlipidemia LDL goal <70 02/25/2014  . Peripheral neuropathy 02/25/2014  . GERD (gastroesophageal reflux disease) 02/25/2014  . Mild CAD 08/29/2013  . Chest pain 08/29/2013  . Multiple sclerosis ([place]) 08/29/2013  . Sinus tarsi syndrome of right ankle 08/01/2013  . Pes cavus 08/01/2013  . Syncope and collapse 07/02/2011  . Abdominal pain 07/02/2011  . Nausea & vomiting 07/02/2011  . Ovarian cyst 07/02/2011  . Weakness generalized 07/02/2011  . Diarrhea 07/02/2011    Past Surgical History:  Procedure Laterality Date  . ABDOMINAL HYSTERECTOMY    . BLEPHAROPLASTY Bilateral   . CARDIAC CATHETERIZATION  10/04/06   MINOR CAD,SINGLE VESSEL INVOLVING THE CIRCUMFLEX. 20 TO 30% PROXIMALLY AND 10 TO 20% IN THE MIDDLE SEGMENT.MILD MUSCLE BRIDGING, MID LAD.NORMAL RCA.NORMAL LV FUNCTION.NORMAL MITRAL AND AORTIC VALVE.NORMAL APPEARING AORTA,THORACIC AND ABDOMINAL.NORMAL RENAL ARTERIES.  [name] CARDIOLOGY NUCLEAR MED STUDY  06/22/12  NL LV FUNCTION,EF 68%,NL WALL MOTION.  [name] CAROTID DUPLEX  07/02/11   KPT:WSFK SOFT PLAQUE NOTED DISTAL CCA AND ORGIN AND PROXIMAL ICA,LEFT>RIGHT.NO ICA STENOSIS. VERTEBRAL ARTERY FLOW IS ANTEGRADE.  [name] CESAREAN SECTION     x2   . COLONOSCOPY WITH PROPOFOL N/A 12/11/2014   Procedure: COLONOSCOPY WITH PROPOFOL;  Surgeon: Ronald Lobo, MD;  Location: [facility];  Service: Endoscopy;  Laterality: N/A;  . KNEE ARTHROSCOPY Left    scope  . PARATHYROIDECTOMY     partial-many years ago  . thumb  surgery Bilateral    built up and bone removal  . TONSILLECTOMY    . TRANSTHORACIC ECHOCARDIOGRAM  07/02/11   LV CAVITY SIZE IS NORMAL. SYSTOLIC FUNCTION WAS NORMAL.EF=55% TO 60%.INCREASED RELATIVE CONTRIBUTION OF ATRIAL CONTRACTION TO VENTRICULAR FILLING;MAYBE DUE TO HYPOVOLEMIA. AV=MILD REGURG.     OB History   No obstetric history on file.      Home Medications    Prior to Admission medications   Medication Sig Start Date End Date Taking? Authorizing Provider  albuterol (PROVENTIL HFA;VENTOLIN HFA) 108 (90 Base) MCG/ACT inhaler Inhale 1-2 puffs into the lungs every 6 (six) hours as needed for wheezing or shortness of breath. 06/13/17   Brand Males, MD  aspirin EC 81 MG tablet Take 1 tablet (81 mg total) by mouth daily. 04/16/15   Barrett, Evelene Croon, PA-C  calcium-vitamin D (OSCAL WITH D) 500-200 MG-UNIT tablet Take 1 tablet by mouth daily.    [provider]  carbamazepine (TEGRETOL) 200 MG tablet  01/29/18   [provider]  cetirizine (ZYRTEC ALLERGY) 10 MG tablet Take 1 tablet (10 mg total) by mouth daily. 11/10/16   Parrett, Fonnie Mu, NP  Cholecalciferol (VITAMIN D PO) Take 1 tablet by mouth daily.    [provider]  CINNAMON PO Take 1 tablet by mouth daily.    [provider]  diazepam (VALIUM) 2 MG tablet  06/19/17   [provider]  diltiazem (CARDIZEM CD) 240 MG 24 hr capsule Take 1 capsule (240 mg total) by mouth daily. 12/20/17 03/20/18  Troy Sine, MD  DULERA 200-5 MCG/ACT AERO USE 2 PUFFS TWICE DAILY. 01/16/18   Brand Males, MD  DUPIXENT 300 MG/2ML SOSY  02/15/18   [provider]  isosorbide mononitrate (IMDUR) 60 MG 24 hr tablet TAKE 1 TABLET ONCE DAILY. 03/20/17   Troy Sine, MD  levothyroxine (SYNTHROID, LEVOTHROID) 25 MCG tablet Take 25 mcg daily before breakfast by mouth.    [provider]  losartan (COZAAR) 50 MG tablet  06/06/17   [provider]  meclizine (ANTIVERT) 25 MG  tablet Take 1 tablet (25 mg total) by mouth 3 (three) times daily as needed for dizziness. 03/25/18   Deno Etienne, DO  montelukast (SINGULAIR) 10 MG tablet TAKE ONE TABLET AT BEDTIME. 11/14/17   Parrett, Fonnie Mu, NP  Multiple Vitamin (MULTIVITAMIN WITH MINERALS) TABS tablet Take 1 tablet by mouth daily.    [provider]  NITROSTAT 0.4 MG SL tablet PLACE 1 TAB UNDER THE TONGUE AS NEEDED FOR CHEST PAIN MAY REPEAT EVERY 5 MINUTES. 09/15/17   Troy Sine, MD  pantoprazole (PROTONIX) 20 MG tablet TAKE 1 TABLET BY MOUTH TWICE DAILY. 12/21/17   Brand Males, MD  PARoxetine (PAXIL) 10 MG tablet Take 5 mg by mouth daily.     [provider]  predniSONE (DELTASONE) 10 MG tablet Take 4 tabs for 2 days, then 3 tabs for 2 days, then 2 tabs for  2 days, then 1 tab for 2 days, then stop 03/23/18   Fenton Foy, NP  Respiratory Therapy Supplies (FLUTTER) DEVI Use as directed 03/23/17   Baird Lyons D, MD  VYTORIN 10-20 MG per tablet Take 1 tablet by mouth daily.  07/30/13   [provider]    Family History Family History  Problem Relation Age of Onset  . Heart disease Mother   . Asthma Father   . Bladder Cancer Father   . Heart failure Father   . Hyperlipidemia Brother     Social History Social History   Tobacco Use  . Smoking status: Never Smoker  . Smokeless tobacco: Never Used  Substance Use Topics  . Alcohol use: Yes    Alcohol/week: 0.0 standard drinks    Comment: wine occ.  . Drug use: No     Allergies   Crestor [rosuvastatin]; Iodinated diagnostic agents; Vicodin [hydrocodone-acetaminophen]; and Isovue [iopamidol]   Review of Systems Review of Systems  Constitutional: Negative for chills and fever.  HENT: Positive for rhinorrhea. Negative for congestion.   Eyes: Negative for redness and visual disturbance.  Respiratory: Positive for cough. Negative for shortness of breath and wheezing.   Cardiovascular: Negative for chest pain and  palpitations.  Gastrointestinal: Negative for nausea and vomiting.  Genitourinary: Negative for dysuria and urgency.  Musculoskeletal: Negative for arthralgias and myalgias.  Skin: Negative for pallor and wound.  Neurological: Positive for dizziness. Negative for headaches.     Physical Exam Updated Vital Signs BP (!) 142/66 (BP Location: Left Arm)   Pulse 74   Temp 98.2 F (36.8 C) (Oral)   Resp (!) 22   Ht 5\' 3"  (1.6 m)   Wt 68 kg   SpO2 97%   BMI 26.57 kg/m   Physical Exam Vitals signs and nursing note reviewed.  Constitutional:      General: She is not in acute distress.    Appearance: She is well-developed. She is not diaphoretic.  HENT:     Head: Normocephalic and atraumatic.     Comments: Fast going left nystagmus.  Swollen turbinates, posterior nasal drip, tm normal bilaterally. mild tenderness to the right maxillary sinus.      Nose: Congestion present.  Eyes:     Pupils: Pupils are equal, round, and reactive to light.  Neck:     Musculoskeletal: Normal range of motion and neck supple.  Cardiovascular:     Rate and Rhythm: Normal rate and regular rhythm.     Heart sounds: No murmur. No friction rub. No gallop.   Pulmonary:     Effort: Pulmonary effort is normal.     Breath sounds: Wheezing present. No rales.     Comments: Diffuse with prolonged expiration Abdominal:     General: There is no distension.     Palpations: Abdomen is soft.     Tenderness: There is no abdominal tenderness.  Musculoskeletal:        General: No tenderness.  Skin:    General: Skin is warm and dry.  Neurological:     Mental Status: She is alert and oriented to person, place, and time.  Psychiatric:        Behavior: Behavior normal.      ED Treatments / Results  Labs (all labs ordered are listed, but only abnormal results are displayed) Labs Reviewed  BASIC METABOLIC PANEL - Abnormal; Notable for the following components:      Result Value   Potassium 3.0 (*)  CO2  21 (*)    Glucose, Bld 108 (*)    All other components within normal limits  CBC WITH DIFFERENTIAL/PLATELET    EKG EKG Interpretation  Date/Time:  Sunday March 25 2018 15:10:05 EST Ventricular Rate:  68 PR Interval:  128 QRS Duration: 84 QT Interval:  406 QTC Calculation: 431 R Axis:   52 Text Interpretation:  Normal sinus rhythm Nonspecific T wave abnormality Abnormal ECG diminished t wave amplitude Otherwise no significant change Confirmed by Tynia Wiers ([id number]) on 03/25/2018 4:15:13 PM   Radiology Dg Chest 2 View  Result Date: 03/25/2018 CLINICAL DATA:  Productive cough. EXAM: CHEST - 2 VIEW COMPARISON:  Radiographs of September 22, 2017. FINDINGS: The heart size and mediastinal contours are within normal limits. Both lungs are clear. The visualized skeletal structures are unremarkable. IMPRESSION: No active cardiopulmonary disease. Electronically Signed   By: James  Green Jr, M.D.   On: 03/25/2018 15:57   Ct Head Wo Contrast  Result Date: 03/25/2018 CLINICAL DATA:  72 year old who tripped over a dog gate at home, falling forward, striking her head on a carpeted floor. Possible loss of consciousness. Dizziness and nausea since the fall. Initial encounter. EXAM: CT HEAD WITHOUT CONTRAST TECHNIQUE: Contiguous axial images were obtained from the base of the skull through the vertex without intravenous contrast. COMPARISON:  MRI brain 08/18/2017. No prior head CT. FINDINGS: Brain: Mild age-appropriate cortical atrophy. Ventricular system normal in size and appearance for age. Moderate to severe changes of small vessel disease of the white matter as noted on the prior MRI. No mass lesion. No midline shift. No acute hemorrhage or hematoma. No extra-axial fluid collections. No evidence of acute infarction. Vascular: Mild BILATERAL carotid siphon atherosclerosis. No hyperdense vessel. Skull: No skull fracture or other focal osseous abnormality involving the skull. Sinuses/Orbits: Mucous retention  cyst or polyp involving the LEFT maxillary sinus as noted on the prior MRI. Mucosal thickening involving ANTERIOR ethmoid air cells bilaterally. Remaining visualized paranasal sinuses, BILATERAL mastoid air cells and BILATERAL middle ear cavities well-aerated. Orbits and globes normal in appearance. Other: None. IMPRESSION: 1. No acute intracranial abnormality. 2. Mild age-appropriate cortical atrophy and stable moderate to severe chronic microvascular ischemic changes of the white matter. Electronically Signed   By: Thomas  Lawrence M.D.   On: 03/25/2018 17:06    Procedures Procedures (including critical care time)  Medications Ordered in ED Medications  albuterol (PROVENTIL) (2.5 MG/3ML) 0.083% nebulizer solution (5 mg  Given 03/25/18 1514)  ipratropium-albuterol (DUONEB) 0.5-2.5 (3) MG/3ML nebulizer solution 3 mL (3 mLs Nebulization Given 03/25/18 1726)  sodium chloride 0.9 % bolus 1,000 mL ( Intravenous Rate/Dose Verify 03/25/18 1757)  prochlorperazine (COMPAZINE) injection 10 mg (10 mg Intravenous Given 03/25/18 1654)  diphenhydrAMINE (BENADRYL) injection 25 mg (25 mg Intravenous Given 03/25/18 1654)  predniSONE (DELTASONE) tablet 60 mg (60 mg Oral Given 03/25/18 1655)  meclizine (ANTIVERT) tablet 25 mg (25 mg Oral Given 03/25/18 1654)  potassium chloride SA (K-DUR,KLOR-CON) CR tablet 40 mEq (40 mEq Oral Given 03/25/18 1737)     Initial Impression / Assessment and Plan / ED Course  I have reviewed the triage vital signs and the nursing notes.  Pertinent labs & imaging results that were available during my care of the patient were reviewed by me and considered in my medical decision making (see chart for details).     72  yo F with a cc of dizziness.  Patient had tripped over something yesterday and struck her head.  Now complaining of dizziness.  Will obtain a CT of the head since this was traumatic.  By history it sounds like BPPV.  Will give a headache cocktail a bolus of IV fluids  check basic labs and reassess.  The patient is diffusely wheezing on my exam we will give 3 duo nebs back-to-back and steroids and reassess.  Patient feeling better.  Clear lungs on repeat assessment.  Headache and dizziness with some improvement.  PCP follow up.   6:09 PM:  I have discussed the diagnosis/risks/treatment options with the patient and family and believe the pt to be eligible for discharge home to follow-up with PCP. We also discussed returning to the ED immediately if new or worsening sx occur. We discussed the sx which are most concerning (e.g., sudden worsening pain, fever, inability to tolerate by mouth) that necessitate immediate return. Medications administered to the patient during their visit and any new prescriptions provided to the patient are listed below.  Medications given during this visit Medications  albuterol (PROVENTIL) (2.5 MG/3ML) 0.083% nebulizer solution (5 mg  Given 03/25/18 1514)  ipratropium-albuterol (DUONEB) 0.5-2.5 (3) MG/3ML nebulizer solution 3 mL (3 mLs Nebulization Given 03/25/18 1726)  sodium chloride 0.9 % bolus 1,000 mL ( Intravenous Rate/Dose Verify 03/25/18 1757)  prochlorperazine (COMPAZINE) injection 10 mg (10 mg Intravenous Given 03/25/18 1654)  diphenhydrAMINE (BENADRYL) injection 25 mg (25 mg Intravenous Given 03/25/18 1654)  predniSONE (DELTASONE) tablet 60 mg (60 mg Oral Given 03/25/18 1655)  meclizine (ANTIVERT) tablet 25 mg (25 mg Oral Given 03/25/18 1654)  potassium chloride SA (K-DUR,KLOR-CON) CR tablet 40 mEq (40 mEq Oral Given 03/25/18 1737)      The patient appears reasonably screen and/or stabilized for discharge and I doubt any other medical condition or other [facility] requiring further screening, evaluation, or treatment in the ED at this time prior to discharge.     Final Clinical Impressions(s) / ED Diagnoses   Final diagnoses:  Vertigo  Upper respiratory tract infection, unspecified type  Moderate persistent asthma with  exacerbation    ED Discharge Orders         Ordered    meclizine (ANTIVERT) 25 MG tablet  3 times daily PRN     03/25/18 1807           Deno Etienne, DO 03/25/18 1809

## 2018-03-25 NOTE — ED Notes (Signed)
Pt voices feeling better and wanting to go home.

## 2018-03-25 NOTE — Discharge Instructions (Signed)
Use your inhaler every 4 hours(6 puffs) while awake, return for sudden worsening shortness of breath, or if you need to use your inhaler more often.  ° °

## 2018-03-26 ENCOUNTER — Ambulatory Visit: Payer: Medicare Other

## 2018-03-26 ENCOUNTER — Encounter: Payer: Self-pay | Admitting: Nurse Practitioner

## 2018-03-26 NOTE — Telephone Encounter (Signed)
[name], is it ok to close this encounter?

## 2018-03-26 NOTE — Assessment & Plan Note (Signed)
Feno in office today was 15 ppb.  She was not wheezing in office today.  Considering symptoms will treat for asthma exacerbation.  Patient is experiencing sinus congestion as well but would like to defer antibiotics at this time.  Patient Instructions  Will order prednisone taper May take mucinex Continue current medications Follow up with [name] as scheduled or sooner if needed

## 2018-03-29 ENCOUNTER — Telehealth: Payer: Self-pay | Admitting: Nurse Practitioner

## 2018-03-29 MED ORDER — DOXYCYCLINE HYCLATE 100 MG PO TABS: 100.0000 mg | ORAL_TABLET | Freq: Two times a day (BID) | ORAL | 0 refills | Status: DC

## 2018-03-29 NOTE — Telephone Encounter (Signed)
Called and spoke with Patient.  She stated that she is not feeling any better.  She is a MR patient seen by Kenney Houseman, NP, 03/23/18, started on Mucinex and Prednisone taper.  She went to [facility] ED, 03/25/18, and was given Antivert.  She stated that she is not feeling any better, still wheezing, cough with occasional green/yellow sputum.  She has declined OV today, but would like something sent to [facility].     Will route message to [facility]

## 2018-03-29 NOTE — Telephone Encounter (Signed)
I sent in some doxycycline for her. thanks

## 2018-03-29 NOTE — Telephone Encounter (Signed)
Called and spoke with Patient. Kenney Houseman NP, recommendations given.  Understanding stated.  Nothing further at this time.

## 2018-03-30 ENCOUNTER — Other Ambulatory Visit: Payer: Self-pay | Admitting: Cardiovascular Disease

## 2018-03-30 ENCOUNTER — Ambulatory Visit (INDEPENDENT_AMBULATORY_CARE_PROVIDER_SITE_OTHER): Payer: Medicare Other

## 2018-03-30 ENCOUNTER — Other Ambulatory Visit: Payer: Self-pay | Admitting: Adult Health

## 2018-03-30 DIAGNOSIS — J82 Pulmonary eosinophilia, not elsewhere classified: Secondary | ICD-10-CM | POA: Diagnosis not present

## 2018-03-30 DIAGNOSIS — J8283 Eosinophilic asthma: Secondary | ICD-10-CM

## 2018-03-30 MED ORDER — DUPILUMAB 300 MG/2ML ~~LOC~~ SOSY
300.0000 mg | PREFILLED_SYRINGE | Freq: Once | SUBCUTANEOUS | Status: AC
  Administered 2018-03-30: 300 mg via SUBCUTANEOUS

## 2018-03-30 NOTE — Telephone Encounter (Signed)
Electronic refill request received for Albuterol neb solution Q4hr prn.  Discontinued in ED by Dr. Nanda Quinton Dec 2018.  Medication is no longer active on med list - please advise if wish to restart this medication. Last refilled by TP Oct 2018.

## 2018-04-02 ENCOUNTER — Ambulatory Visit: Payer: Medicare Other | Admitting: Cardiovascular Disease

## 2018-04-02 ENCOUNTER — Encounter: Payer: Self-pay | Admitting: Cardiovascular Disease

## 2018-04-02 VITALS — BP 120/66 | HR 71 | Ht 63.0 in | Wt 148.8 lb

## 2018-04-02 DIAGNOSIS — R0683 Snoring: Secondary | ICD-10-CM

## 2018-04-02 DIAGNOSIS — I10 Essential (primary) hypertension: Secondary | ICD-10-CM | POA: Diagnosis not present

## 2018-04-02 DIAGNOSIS — J4520 Mild intermittent asthma, uncomplicated: Secondary | ICD-10-CM

## 2018-04-02 DIAGNOSIS — I2510 Atherosclerotic heart disease of native coronary artery without angina pectoris: Secondary | ICD-10-CM | POA: Diagnosis not present

## 2018-04-02 DIAGNOSIS — R002 Palpitations: Secondary | ICD-10-CM

## 2018-04-02 DIAGNOSIS — G478 Other sleep disorders: Secondary | ICD-10-CM

## 2018-04-02 DIAGNOSIS — I350 Nonrheumatic aortic (valve) stenosis: Secondary | ICD-10-CM

## 2018-04-02 DIAGNOSIS — E785 Hyperlipidemia, unspecified: Secondary | ICD-10-CM

## 2018-04-02 NOTE — Progress Notes (Signed)
Patient ID: MONZERAT HANDLER, female   DOB: 10/17/1945, 73 y.o.   MRN: [id number]    PCP: Dr. [name]  HPI: JAELYN BOURGOIN is a 73 y.o. female who presents to the office today for a 4 month follow up cardiology evaluation.  Ms. Lascano has documented mild CAD by  cardiac catheterization in July 2008 by Dr. Roe Rutherford.  She had mild narrowing of 20-30% in the proximal and 10-20% in the mid left circumflex vessel.  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  She has been on medical therapy.  She also has a history of hypertension,.  Her last stress test was done in March 2014 were she had nonspecific T changes and nondiagnostic 0.5-1 mm inferolateral ST segment changes with stress.  Scintigraphic images revealed normal perfusion and function.  She has a history of multiple sclerosis and is followed by Dr. Delphia Grates in [place].  She has a history of hyperlipidemia for which she takes Vytorin 10/20 and GERD for which he takes over-the-counter Prilosec.   She has experienced recent episodes of chest pain which have been occurring almost weekly. She experiences squeezing in her arms and jaw.  She denies chest pressure.  The symptoms are not associated with activity and typically resolve on her own.  She has taken nitroglycerin with questionable benefit.  She also notes calf discomfort at night while sleeping.  She denies restless legs.  She was started on a human monoclonal antibody ocrelizumab  for her multiple sclerosis and admits to improvement in symptomatology.  When I saw her last year she had experienced recurrent episodes of chest pain with some atypical features.  She underwent a nuclear perfusion study in December 2016 which revealed normal perfusion with an ejection fraction of 66%.  She developed recurrent chest pain in January and again was felt to be stable cardiovascularly.  She subsequently was evaluated at [facility] and was felt that her chest discomfort  was due to esophageal lower esophageal sphincter spasm and inability to relax appropriately.  Her symptoms have improved with Protonix and she is now being weaned off Protonix and has been started on Zantac.  Since I last saw her in October 2016, she has been evaluated on several occasions by [name], [facility] with his most recent evaluation in March 2019.  He had seen her in December 2018 with chest pain more consistent with esophageal spasm.  An echocardiogram in December 2018 showed an EF of 60 to 65% with grade 1 diastolic dysfunction, mild aortic stenosis with trivial AR.  She has had issues with asthma.  She also had experienced some palpitations and a 24-hour monitor revealed short bursts of SVT, PACs and PVCs.  Palpitations have improved though she still experiences some palpitations at night while falling asleep.  She eats chocolate on a daily basis.  Her sleep is very poor.  She has frequent awakenings.  She snores.  She has nocturia at least 3 times per night.  He denies any chest pain.   I last saw her in September 2019 with her palpitations I recommended discontinuance of amlodipine and instituted Cardizem CD 240 mg.  We discussed avoidance of chocolate which contains caffeine.  Due to concerns for sleep apnea I also recommended she undergo a sleep evaluation.  She has also seen pulmonary since her last evaluation with complaints of wheezing and on March 25, 2018 was evaluated in the emergency room with dizziness.  She had fallen  over a dog gate and struck her head.  A head CT did not reveal any acute intracranial abnormality.  There was mild age-appropriate cortical atrophy with moderate to severe chronic microvascular ischemic changes of the white matter.  She is scheduled to undergo a sleep study on January 27.  She continues to experience occasional palpitations at night.  She recently was started on a prednisone taper due to her wheezing has had some chest congestion.  She presents for  evaluation.   Past Medical History:  Diagnosis Date  . Anemia    past history-many yrs ago  . Anginal pain ([place])    being evaluated by Dr. Tyrone Sage, arm pain,"bad indigestion" -no heart related findings as of yet  . Arthritis    hip. back pain  . Complication of anesthesia    s/p Hysterectomy "vagal response "heart stopped" -did not require shocking.  . Coronary artery disease   . Dry eyes   . Esophageal spasm   . GERD (gastroesophageal reflux disease)   . MS (multiple sclerosis) ([place])    stable-sees [name] every 6 months    Past Surgical History:  Procedure Laterality Date  . ABDOMINAL HYSTERECTOMY    . BLEPHAROPLASTY Bilateral   . CARDIAC CATHETERIZATION  10/04/06   MINOR CAD,SINGLE VESSEL INVOLVING THE CIRCUMFLEX. 20 TO 30% PROXIMALLY AND 10 TO 20% IN THE MIDDLE SEGMENT.MILD MUSCLE BRIDGING, MID LAD.NORMAL RCA.NORMAL LV FUNCTION.NORMAL MITRAL AND AORTIC VALVE.NORMAL APPEARING AORTA,THORACIC AND ABDOMINAL.NORMAL RENAL ARTERIES.  [name] CARDIOLOGY NUCLEAR MED STUDY  06/22/12   NL LV FUNCTION,EF 68%,NL WALL MOTION.  [name] CAROTID DUPLEX  07/02/11   EXM:DYJW SOFT PLAQUE NOTED DISTAL CCA AND ORGIN AND PROXIMAL ICA,LEFT>RIGHT.NO ICA STENOSIS. VERTEBRAL ARTERY FLOW IS ANTEGRADE.  [name] CESAREAN SECTION     x2   . COLONOSCOPY WITH PROPOFOL N/A 12/11/2014   Procedure: COLONOSCOPY WITH PROPOFOL;  Surgeon: Ronald Lobo, MD;  Location: [facility] ENDOSCOPY;  Service: Endoscopy;  Laterality: N/A;  . KNEE ARTHROSCOPY Left    scope  . PARATHYROIDECTOMY     partial-many years ago  . thumb surgery Bilateral    built up and bone removal  . TONSILLECTOMY    . TRANSTHORACIC ECHOCARDIOGRAM  07/02/11   LV CAVITY SIZE IS NORMAL. SYSTOLIC FUNCTION WAS NORMAL.EF=55% TO 60%.INCREASED RELATIVE CONTRIBUTION OF ATRIAL CONTRACTION TO VENTRICULAR FILLING;MAYBE DUE TO HYPOVOLEMIA. AV=MILD REGURG.    Allergies  Allergen Reactions  . Crestor [Rosuvastatin] Other (See Comments)    Joint pain   . Iodinated Diagnostic  Agents Other (See Comments)    Sneezing and itchy throat; dye was Isovue 300  . Vicodin [Hydrocodone-Acetaminophen]     Made pass-out one time and is okay taking now  . Isovue [Iopamidol]     Pt had sneezing and itching of her throat and soft palate.  Dr Alvester Chou checked pt.  She will need premeds in the future.  J Bohm    Current Outpatient Medications  Medication Sig Dispense Refill  . albuterol (PROVENTIL HFA;VENTOLIN HFA) 108 (90 Base) MCG/ACT inhaler Inhale 1-2 puffs into the lungs every 6 (six) hours as needed for wheezing or shortness of breath. 1 Inhaler 5  . aspirin EC 81 MG tablet Take 1 tablet (81 mg total) by mouth daily. 90 tablet 3  . calcium-vitamin D (OSCAL WITH D) 500-200 MG-UNIT tablet Take 1 tablet by mouth daily.    . carbamazepine (TEGRETOL) 200 MG tablet     . cetirizine (ZYRTEC ALLERGY) 10 MG tablet Take 1 tablet (10 mg total) by mouth daily.  30 tablet 5  . Cholecalciferol (VITAMIN D PO) Take 1 tablet by mouth daily.    [name] CINNAMON PO Take 1 tablet by mouth daily.    . diazepam (VALIUM) 2 MG tablet     . doxycycline (VIBRA-TABS) 100 MG tablet Take 1 tablet (100 mg total) by mouth 2 (two) times daily. 14 tablet 0  . DULERA 200-5 MCG/ACT AERO USE 2 PUFFS TWICE DAILY. 13 g 3  . DUPIXENT 300 MG/2ML SOSY     . isosorbide mononitrate (IMDUR) 60 MG 24 hr tablet Take 1 tablet (60 mg total) by mouth daily. 90 tablet 0  . levothyroxine (SYNTHROID, LEVOTHROID) 25 MCG tablet Take 25 mcg daily before breakfast by mouth.    . losartan (COZAAR) 50 MG tablet     . meclizine (ANTIVERT) 25 MG tablet Take 1 tablet (25 mg total) by mouth 3 (three) times daily as needed for dizziness. 30 tablet 0  . montelukast (SINGULAIR) 10 MG tablet TAKE ONE TABLET AT BEDTIME. 30 tablet 3  . Multiple Vitamin (MULTIVITAMIN WITH MINERALS) TABS tablet Take 1 tablet by mouth daily.    [name] NITROSTAT 0.4 MG SL tablet PLACE 1 TAB UNDER THE TONGUE AS NEEDED FOR CHEST PAIN MAY REPEAT EVERY 5 MINUTES. 25 tablet 3  .  pantoprazole (PROTONIX) 20 MG tablet TAKE 1 TABLET BY MOUTH TWICE DAILY. 90 tablet 3  . PARoxetine (PAXIL) 10 MG tablet Take 5 mg by mouth daily.     . predniSONE (DELTASONE) 10 MG tablet Take 4 tabs for 2 days, then 3 tabs for 2 days, then 2 tabs for 2 days, then 1 tab for 2 days, then stop 20 tablet 0  . Respiratory Therapy Supplies (FLUTTER) DEVI Use as directed 1 each 0  . VYTORIN 10-20 MG per tablet Take 1 tablet by mouth daily.     [name] diltiazem (CARDIZEM CD) 240 MG 24 hr capsule Take 1 capsule (240 mg total) by mouth daily. 90 capsule 3   Current Facility-Administered Medications  Medication Dose Route Frequency Provider Last Rate Last Dose  . Mepolizumab SOLR 100 mg  100 mg Subcutaneous Q28 days Brand Males, MD   100 mg at 06/14/17 0917  . Mepolizumab SOLR 100 mg  100 mg Subcutaneous Q28 days Brand Males, MD   100 mg at 07/12/17 0902  . Mepolizumab SOLR 100 mg  100 mg Subcutaneous Q28 days Brand Males, MD   100 mg at 10/31/17 5883    Social History   Socioeconomic History  . Marital status: Divorced    Spouse name: Not on file  . Number of children: Not on file  . Years of education: Not on file  . Highest education level: Not on file  Occupational History  . Occupation: retired  Scientific laboratory technician  . Financial resource strain: Not on file  . Food insecurity:    Worry: Not on file    Inability: Not on file  . Transportation needs:    Medical: Not on file    Non-medical: Not on file  Tobacco Use  . Smoking status: Never Smoker  . Smokeless tobacco: Never Used  Substance and Sexual Activity  . Alcohol use: Yes    Alcohol/week: 0.0 standard drinks    Comment: wine occ.  . Drug use: No  . Sexual activity: Not on file  Lifestyle  . Physical activity:    Days per week: Not on file    Minutes per session: Not on file  . Stress: Not on file  Relationships  . Social connections:    Talks on phone: Not on file    Gets together: Not on file    Attends  religious service: Not on file    Active member of club or organization: Not on file    Attends meetings of clubs or organizations: Not on file    Relationship status: Not on file  . Intimate partner violence:    Fear of current or ex partner: Not on file    Emotionally abused: Not on file    Physically abused: Not on file    Forced sexual activity: Not on file  Other Topics Concern  . Not on file  Social History Narrative  . Not on file    Family History  Problem Relation Age of Onset  . Heart disease Mother   . Asthma Father   . Bladder Cancer Father   . Heart failure Father   . Hyperlipidemia Brother     ROS General: Negative; No fevers, chills, or night sweats HEENT: Negative; No changes in vision or hearing, sinus congestion, difficulty swallowing Pulmonary: Negative; No cough, wheezing, shortness of breath, hemoptysis Cardiovascular: See history of present illness; No presyncope, syncope, palpatations GI: Positive for esophageal lower esophageal sphincter spasm GU: Negative; No dysuria, hematuria, or difficulty voiding Musculoskeletal: Negative; no myalgias, joint pain, or weakness Hematologic: Negative; no easy bruising, bleeding Endocrine: Negative; no heat/cold intolerance; no diabetes, Neuro: Positive for multiple sclerosis, currently on no therapy, no changes in balance, headaches Skin: Negative; No rashes or skin lesions Psychiatric: Negative; No behavioral problems, depression Sleep: Positive for snoring,  daytime sleepiness, hypersomnolence, bruxism, restless legs, hypnogognic hallucinations. Other comprehensive 14 point system review is negative    Physical Exam BP 120/66   Pulse 71   Ht '5\' 3"'$  (1.6 m)   Wt 148 lb 12.8 oz (67.5 kg)   BMI 26.36 kg/m    Repeat blood pressure by me was 118/70  Wt Readings from Last 3 Encounters:  04/02/18 148 lb 12.8 oz (67.5 kg)  03/25/18 150 lb (68 kg)  03/23/18 152 lb (68.9 kg)   General: Alert, oriented, no  distress.  Skin: normal turgor, no rashes, warm and dry HEENT: Normocephalic, atraumatic. Pupils equal round and reactive to light; sclera anicteric; extraocular muscles intact;  Nose without nasal septal hypertrophy Mouth/Parynx benign; Mallinpatti scale 3 Neck: No JVD, no carotid bruits; normal carotid upstroke Lungs: clear to ausculatation and percussion; no wheezing or rales Chest wall: without tenderness to palpitation Heart: PMI not displaced, RRR, s1 s2 normal, 1/6 systolic murmur, no diastolic murmur, no rubs, gallops, thrills, or heaves Abdomen: soft, nontender; no hepatosplenomehaly, BS+; abdominal aorta nontender and not dilated by palpation. Back: no CVA tenderness Pulses 2+ Musculoskeletal: full range of motion, normal strength, no joint deformities Extremities: no clubbing cyanosis or edema, Homan's sign negative  Neurologic: grossly nonfocal; Cranial nerves grossly wnl Psychologic: Normal mood and affect   ECG (independently read by me): Normal sinus rhythm at 71 bpm.  PAC.  Normal intervals.  Nonspecific T wave abnormality.  September 2019 ECG (independently read by me): Normal sinus rhythm at 71 bpm.  Nonspecific T changes.  Normal intervals.  No ectopy.  October 2016 ECG (independently read by me): Normal sinus rhythm at 62 bpm.  No ectopy.  Nonspecific T-wave changes.  December 2016 ECG (independently read by me):  Sinus bradycardia 58 bpm.  Nonspecific T changes.  ECG (independently read by me): Normal sinus rhythm at 62 bpm.  Normal intervals.  QTc interval 440 ms.  No significant ST segment changes.  June 2015 ECG (independently read by me): Sinus rhythm at 56 beats per minute.  Nonspecific ST changes.  LABS:  BMP Latest Ref Rng & Units 03/25/2018 03/19/2017 10/07/2016  Glucose 70 - 99 mg/dL 108(H) 101(H) 128(H)  BUN 8 - 23 mg/dL 17 10 16   Creatinine 0.44 - 1.00 mg/dL 0.72 0.72 0.77  Sodium 135 - 145 mmol/L 136 140 141  Potassium 3.5 - 5.1 mmol/L 3.0(L) 3.8  4.0  Chloride 98 - 111 mmol/L 106 107 108  CO2 22 - 32 mmol/L 21(L) 23 23  Calcium 8.9 - 10.3 mg/dL 9.2 8.9 9.2   Hepatic Function Latest Ref Rng & Units 03/19/2017 05/10/2009  Total Protein 6.5 - 8.1 g/dL 6.4(L) 5.9(L)  Albumin 3.5 - 5.0 g/dL 3.9 3.4(L)  AST 15 - 41 U/L 21 19  ALT 14 - 54 U/L 24 22  Alk Phosphatase 38 - 126 U/L 75 70  Total Bilirubin 0.3 - 1.2 mg/dL 0.6 0.4   CBC Latest Ref Rng & Units 03/25/2018 08/08/2017 03/19/2017  WBC 4.0 - 10.5 K/uL 8.8 5.8 5.2  Hemoglobin 12.0 - 15.0 g/dL 12.7 14.4 13.0  Hematocrit 36.0 - 46.0 % 39.0 43.0 39.0  Platelets 150 - 400 K/uL 193 250.0 193   Lab Results  Component Value Date   MCV 91.1 03/25/2018   MCV 91.7 08/08/2017   MCV 92.6 03/19/2017    Lab Results  Component Value Date   TSH 0.735 07/02/2011   No results found for: HGBA1C   Lipid Panel  No results found for: CHOL, TRIG, HDL, CHOLHDL, VLDL, LDLCALC, LDLDIRECT   RADIOLOGY: No results found.  IMPRESSION: 1. Essential hypertension   2. CAD in native artery   3. Palpitation   4. Intermittent asthma without complication, unspecified asthma severity   5. Mild aortic stenosis   6. Snoring   7. Non-restorative sleep   8. Hyperlipidemia LDL goal <70     ASSESSMENT AND PLAN: Ms. Ikran Patman is a 74 year-old female with previously documented mild CAD involving her left circumflex coronary artery in addition to systolic bridging of the mid LAD noted at cardiac catheterization in February 2008. A nuclear perfusion study in March 2014 showed normal perfusion.She developed recurrent chest pain symptomatology in December 2016 and repeat nuclear perfusion study remained entirely normal.  Has been felt to have esophageal spasm to her chest pain as result of her lower esophageal stricture.  She was evaluated at [facility].  Her March 08, 2017 echo Doppler study has shown normal systolic function with an EF of 60 to 21%, grade 1 diastolic dysfunction, and mild aortic stenosis  with trivial AR.  She has had occasional palpitations and a prior Holter monitor had shown predominant sinus rhythm at 83 bpm with occasional PACs, atrial couplets, atrial bigeminy and trigeminy and an 8 beat run of atrial tachycardia.  She also had isolated PVCs.  Her palpitations have improved with the change to Cardizem in place of amlodipine however she continues to experience some nocturnal events.  She was recently started on 2 pack sent continues to be on [facility], as needed albuterol and is on a prednisone taper.  I have suggested also she try Mucinex to see if this may help some of her ingestion in bringing up secretions.  She is not having any anginal symptoms and continues to be on isosorbide.  She is on levothyroxine for mild hypothyroidism.  She will be undergoing her  sleep study on January 27 for further evaluation of obstructive sleep apnea.  She continues to be on Vytorin 10/24 hyperlipidemia with target LDL less than 70.  I will see her in the office in 4 months for reevaluation.    Time spent: 30 minutes Troy Sine, MD, [facility]  04/07/2018 7:25 PM

## 2018-04-02 NOTE — Patient Instructions (Signed)
Medication Instructions:  The current medical regimen is effective;  continue present plan and medications.  If you need a refill on your cardiac medications before your next appointment, please call your pharmacy.   Follow-Up: At [facility], you and your health needs are our priority.  As part of our continuing mission to provide you with exceptional heart care, we have created designated Provider Care Teams.  These Care Teams include your primary Cardiologist (physician) and Advanced Practice Providers (APPs -  Physician Assistants and Nurse Practitioners) who all work together to provide you with the care you need, when you need it. You will need a follow up appointment in 4 months.  Please call our office 2 months in advance to schedule this appointment.  You may see Dr.Kelly or one of the following Advanced Practice Providers on your designated Care Team: Hao Meng, PA-C . Angela Duke, PA-C    

## 2018-04-07 ENCOUNTER — Encounter: Payer: Self-pay | Admitting: Cardiovascular Disease

## 2018-04-09 ENCOUNTER — Encounter: Payer: Self-pay | Admitting: Acute Care

## 2018-04-09 ENCOUNTER — Ambulatory Visit: Payer: Medicare Other | Admitting: Acute Care

## 2018-04-09 VITALS — BP 124/70 | HR 74 | Ht 62.0 in | Wt 149.0 lb

## 2018-04-09 DIAGNOSIS — B370 Candidal stomatitis: Secondary | ICD-10-CM | POA: Diagnosis not present

## 2018-04-09 DIAGNOSIS — J410 Simple chronic bronchitis: Secondary | ICD-10-CM | POA: Insufficient documentation

## 2018-04-09 DIAGNOSIS — J45909 Unspecified asthma, uncomplicated: Secondary | ICD-10-CM | POA: Diagnosis not present

## 2018-04-09 MED ORDER — MAGIC MOUTHWASH: 5.0000 mL | Freq: Three times a day (TID) | ORAL | 0 refills | Status: DC

## 2018-04-09 MED ORDER — IPRATROPIUM-ALBUTEROL 0.5-2.5 (3) MG/3ML IN SOLN: 3.0000 mL | Freq: Two times a day (BID) | RESPIRATORY_TRACT | 0 refills | Status: DC

## 2018-04-09 NOTE — Assessment & Plan Note (Signed)
Slow to resolve flare Plan: Continue your Zyrtec and Singulair every day. Continue your Dulera 2 puffs twice daily. Rinse mouth after use. We will send in prescription for magic mouthwash. Use 5 cc's three time daily, swish and swallow. Add Delsym as directed during the day. Sips of water instead of throat clearing Sugar Free [employer] or [employer] originals for throat soothing. Add pepcid ( famotidine)  at bedtime  Sinus irrigations twice daily. Use sterile or distilled water and the packets that come in the irrigation system. Flonase ( Fluticasone) 2 sprays daily in each nare. Chlorpheniramine 4 mg at bedtime ( Over the counter) This will make you sleepy, do not drive if sleepy Avoid mint, menthol, spearmint. We will prescribe the Duonebs nebulizer medication , use twice daily for wheezing and shortness of breath. Follow up with [name] 05/31/2018. Dupixent injection as scheduled 04/16/2018. Please contact office for sooner follow up if symptoms do not improve or worsen or seek emergency care

## 2018-04-09 NOTE — Patient Instructions (Addendum)
It is nice to see you today. Continue your Zyrtec and Singulair every day. Continue your Dulera 2 puffs twice daily. Rinse mouth after use. We will send in prescription for magic mouthwash. Use 5 cc's three time daily, swish and swallow. Add Delsym as directed during the day. Sips of water instead of throat clearing Sugar Free [employer] or [employer] originals for throat soothing. Add pepcid ( famotidine)  at bedtime  Sinus irrigations twice daily. Use sterile or distilled water and the packets that come in the irrigation system. Flonase ( Fluticasone) 2 sprays daily in each nare. Chlorpheniramine 4 mg at bedtime ( Over the counter) This will make you sleepy, do not drive if sleepy Avoid mint, menthol, spearmint. We will prescribe the Duonebs nebulizer medication , use twice daily for wheezing and shortness of breath. Follow up with [name] 05/31/2018. Dupixent injection as scheduled 04/16/2018. Please contact office for sooner follow up if symptoms do not improve or worsen or seek emergency care

## 2018-04-09 NOTE — Assessment & Plan Note (Signed)
Slow to resolve flare Plan Continue your Zyrtec and Singulair every day. Continue your Dulera 2 puffs twice daily. Rinse mouth after use. Follow up with [name] 05/31/2018. Dupixent injection as scheduled 04/16/2018. Please contact office for sooner follow up if symptoms do not improve or worsen or seek emergency care

## 2018-04-09 NOTE — Assessment & Plan Note (Signed)
Flare Plan Rinse mouth after use of all inhaled corticosteroids Magic Mouthwash 5 cc's 3 x daily, swish and swallow.

## 2018-04-09 NOTE — Progress Notes (Signed)
History of Present Illness [name] is a 73 y.o. female with moderate eosinophilic and allergic asthma , she is followed by [name].   04/09/2018 Hospital Follow up: Pt. Was seen in the ED 03/25/2018 for cough and dizziness. She had been coughing at that time for 5 days. She had also fallen and hit her head. ( Tripped over a dog gate in the dark) .CT head was normal She had been  seen in the pulmonary office earlier that day 03/25/2018 for a bronchitis flare/ asthma flare.  She was seen by Lazaro Arms. NP. She was treated with prednisone, and Doxycycline. FENO at the time was 15 ppb. She didn't get better,her wheezing progressed and was  much worse,  so she presented to the ED that same day after the  Fall to be assessed. CT head was normal.  .She was treated with back to back Duo Nebs x 3 which broke her wheezing. She was released with recommendation of follow up with Pulmonary. She presents today with continued cough and runny nose.She has been treating this with Mucinex. She has a neb machine at home. She was out of her albuterol nebs. She states she feels the Duonebs she had in the ED  worked better for her.She is compliant with her Dulera and Singulair.She uses her rescue inhaler only with wheezing, which has resolved.   She states the prednisone taper did not make her cough any better. She states she has thrush. She would like something to treat it. She is compliant with her Dupixent injection every 2 weeks. She is scheduled for her next injection 04/16/2018.She denies fever, chest pain, orthopnea or hemoptysis.  Test Results:  CT Head 03/25/2018 No acute intracranial abnormality. 2. Mild age-appropriate cortical atrophy and stable moderate to severe chronic microvascular ischemic changes of the white matter.  CXR 03/25/2018 No active cardiopulmonary disease  CXR 09/22/17>>Mild scarring stable from the prior exam. CXR 06/08/17>> Mild atelectasis of bilateral lung bases. No focal  pneumonia identified.  FENO 03/23/18>>  15 ppb  CBC Latest Ref Rng & Units 03/25/2018 08/08/2017 03/19/2017  WBC 4.0 - 10.5 K/uL 8.8 5.8 5.2  Hemoglobin 12.0 - 15.0 g/dL 12.7 14.4 13.0  Hematocrit 36.0 - 46.0 % 39.0 43.0 39.0  Platelets 150 - 400 K/uL 193 250.0 193    BMP Latest Ref Rng & Units 03/25/2018 03/19/2017 10/07/2016  Glucose 70 - 99 mg/dL 108(H) 101(H) 128(H)  BUN 8 - 23 mg/dL 17 10 16   Creatinine 0.44 - 1.00 mg/dL 0.72 0.72 0.77  Sodium 135 - 145 mmol/L 136 140 141  Potassium 3.5 - 5.1 mmol/L 3.0(L) 3.8 4.0  Chloride 98 - 111 mmol/L 106 107 108  CO2 22 - 32 mmol/L 21(L) 23 23  Calcium 8.9 - 10.3 mg/dL 9.2 8.9 9.2    BNP No results found for: BNP  ProBNP No results found for: PROBNP  PFT    Component Value Date/Time   FEV1PRE 2.08 11/29/2016 1007   FEV1POST 2.00 11/29/2016 1007   FVCPRE 2.62 11/29/2016 1007   FVCPOST 2.50 11/29/2016 1007   TLC 5.05 11/29/2016 1007   DLCOUNC 20.03 11/29/2016 1007   PREFEV1FVCRT 79 11/29/2016 1007   PSTFEV1FVCRT 80 11/29/2016 1007    Dg Chest 2 View  Result Date: 03/25/2018 CLINICAL DATA:  Productive cough. EXAM: CHEST - 2 VIEW COMPARISON:  Radiographs of September 22, 2017. FINDINGS: The heart size and mediastinal contours are within normal limits. Both lungs are clear. The visualized skeletal structures are  unremarkable. IMPRESSION: No active cardiopulmonary disease. Electronically Signed   By: Marijo Conception, M.D.   On: 03/25/2018 15:57   Ct Head Wo Contrast  Result Date: 03/25/2018 CLINICAL DATA:  73 year old who tripped over a dog gate at home, falling forward, striking her head on a carpeted floor. Possible loss of consciousness. Dizziness and nausea since the fall. Initial encounter. EXAM: CT HEAD WITHOUT CONTRAST TECHNIQUE: Contiguous axial images were obtained from the base of the skull through the vertex without intravenous contrast. COMPARISON:  MRI brain 08/18/2017. No prior head CT. FINDINGS: Brain: Mild  age-appropriate cortical atrophy. Ventricular system normal in size and appearance for age. Moderate to severe changes of small vessel disease of the white matter as noted on the prior MRI. No mass lesion. No midline shift. No acute hemorrhage or hematoma. No extra-axial fluid collections. No evidence of acute infarction. Vascular: Mild BILATERAL carotid siphon atherosclerosis. No hyperdense vessel. Skull: No skull fracture or other focal osseous abnormality involving the skull. Sinuses/Orbits: Mucous retention cyst or polyp involving the LEFT maxillary sinus as noted on the prior MRI. Mucosal thickening involving ANTERIOR ethmoid air cells bilaterally. Remaining visualized paranasal sinuses, BILATERAL mastoid air cells and BILATERAL middle ear cavities well-aerated. Orbits and globes normal in appearance. Other: None. IMPRESSION: 1. No acute intracranial abnormality. 2. Mild age-appropriate cortical atrophy and stable moderate to severe chronic microvascular ischemic changes of the white matter. Electronically Signed   By: Evangeline Dakin M.D.   On: 03/25/2018 17:06     Past medical hx Past Medical History:  Diagnosis Date  . Anemia    past history-many yrs ago  . Anginal pain ([place])    being evaluated by Dr. Tyrone Sage, arm pain,"bad indigestion" -no heart related findings as of yet  . Arthritis    hip. back pain  . Complication of anesthesia    s/p Hysterectomy "vagal response "heart stopped" -did not require shocking.  . Coronary artery disease   . Dry eyes   . Esophageal spasm   . GERD (gastroesophageal reflux disease)   . MS (multiple sclerosis) ([place])    stable-sees [name] every 6 months     Social History   Tobacco Use  . Smoking status: Never Smoker  . Smokeless tobacco: Never Used  Substance Use Topics  . Alcohol use: Yes    Alcohol/week: 0.0 standard drinks    Comment: wine occ.  . Drug use: No    Ms.Spinney reports that she has never smoked. She has never used smokeless  tobacco. She reports current alcohol use. She reports that she does not use drugs.  Tobacco Cessation: Never smoker  Past surgical hx, Family hx, Social hx all reviewed.  Current Outpatient Medications on File Prior to Visit  Medication Sig  . albuterol (PROVENTIL HFA;VENTOLIN HFA) 108 (90 Base) MCG/ACT inhaler Inhale 1-2 puffs into the lungs every 6 (six) hours as needed for wheezing or shortness of breath.  [name] aspirin EC 81 MG tablet Take 1 tablet (81 mg total) by mouth daily.  . calcium-vitamin D (OSCAL WITH D) 500-200 MG-UNIT tablet Take 1 tablet by mouth daily.  . carbamazepine (TEGRETOL) 200 MG tablet   . cetirizine (ZYRTEC ALLERGY) 10 MG tablet Take 1 tablet (10 mg total) by mouth daily.  . Cholecalciferol (VITAMIN D PO) Take 1 tablet by mouth daily.  [name] CINNAMON PO Take 1 tablet by mouth daily.  . diazepam (VALIUM) 2 MG tablet   . DULERA 200-5 MCG/ACT AERO USE 2 PUFFS TWICE DAILY.  [name]  DUPIXENT 300 MG/2ML SOSY   . isosorbide mononitrate (IMDUR) 60 MG 24 hr tablet Take 1 tablet (60 mg total) by mouth daily.  [name] levothyroxine (SYNTHROID, LEVOTHROID) 25 MCG tablet Take 25 mcg daily before breakfast by mouth.  . losartan (COZAAR) 50 MG tablet   . meclizine (ANTIVERT) 25 MG tablet Take 1 tablet (25 mg total) by mouth 3 (three) times daily as needed for dizziness.  . montelukast (SINGULAIR) 10 MG tablet TAKE ONE TABLET AT BEDTIME.  . Multiple Vitamin (MULTIVITAMIN WITH MINERALS) TABS tablet Take 1 tablet by mouth daily.  [name] NITROSTAT 0.4 MG SL tablet PLACE 1 TAB UNDER THE TONGUE AS NEEDED FOR CHEST PAIN MAY REPEAT EVERY 5 MINUTES.  . pantoprazole (PROTONIX) 20 MG tablet TAKE 1 TABLET BY MOUTH TWICE DAILY.  [name] PARoxetine (PAXIL) 10 MG tablet Take 5 mg by mouth daily.   [name] Respiratory Therapy Supplies (FLUTTER) DEVI Use as directed  . VYTORIN 10-20 MG per tablet Take 1 tablet by mouth daily.   [name] diltiazem (CARDIZEM CD) 240 MG 24 hr capsule Take 1 capsule (240 mg total) by mouth daily.    Current Facility-Administered Medications on File Prior to Visit  Medication  . Mepolizumab SOLR 100 mg  . Mepolizumab SOLR 100 mg  . Mepolizumab SOLR 100 mg     Allergies  Allergen Reactions  . Crestor [Rosuvastatin] Other (See Comments)    Joint pain   . Iodinated Diagnostic Agents Other (See Comments)    Sneezing and itchy throat; dye was Isovue 300  . Vicodin [Hydrocodone-Acetaminophen]     Made pass-out one time and is okay taking now  . Isovue [Iopamidol]     Pt had sneezing and itching of her throat and soft palate.  Dr Alvester Chou checked pt.  She will need premeds in the future.  J Bohm    Review Of Systems:  Constitutional:   No  weight loss, night sweats,  Fevers, chills, fatigue, or  lassitude.  HEENT:   No headaches,  Difficulty swallowing,  Tooth/dental problems, or  Sore throat,                No sneezing, itching, ear ache, nasal congestion,+  post nasal drip,   CV:  No chest pain,  Orthopnea, PND, swelling in lower extremities, anasarca, dizziness, palpitations, syncope.   GI  No heartburn, indigestion, abdominal pain, nausea, vomiting, diarrhea, change in bowel habits, loss of appetite, bloody stools.   Resp: No shortness of breath with exertion or at rest.  No excess mucus, no productive cough,  ++ non-productive cough,  No coughing up of blood.  No change in color of mucus.  No wheezing.  No chest wall deformity  Skin: no rash or lesions.  GU: no dysuria, change in color of urine, no urgency or frequency.  No flank pain, no hematuria   MS:  No joint pain or swelling.  No decreased range of motion.  No back pain.  Psych:  No change in mood or affect. No depression or anxiety.  No memory loss.   Vital Signs BP 124/70 (BP Location: Left Arm, Cuff Size: Normal)   Pulse 74   Ht 5\' 2"  (1.575 m)   Wt 149 lb (67.6 kg)   SpO2 95%   BMI 27.25 kg/m    Physical Exam:  General- No distress,  A&Ox3, pleasant ENT: No sinus tenderness, TM clear, pale nasal  mucosa, no oral exudate,+ post nasal drip, no LAN, cough Cardiac: S1, S2, regular rate  and rhythm, no murmur Chest: No wheeze/ rales/ dullness; no accessory muscle use, no nasal flaring, no sternal retractions Abd.: Soft Non-tender Ext: No clubbing cyanosis, edema Neuro:  normal strength Skin: No rashes, warm and dry Psych: normal mood and behavior, appropriate   Overdose risk is 120/999   Assessment/Plan  Simple chronic bronchitis (HCC) Slow to resolve flare Plan: Continue your Zyrtec and Singulair every day. Continue your Dulera 2 puffs twice daily. Rinse mouth after use. We will send in prescription for magic mouthwash. Use 5 cc's three time daily, swish and swallow. Add Delsym as directed during the day. Sips of water instead of throat clearing Sugar Free [employer] or [employer] originals for throat soothing. Add pepcid ( famotidine)  at bedtime  Sinus irrigations twice daily. Use sterile or distilled water and the packets that come in the irrigation system. Flonase ( Fluticasone) 2 sprays daily in each nare. Chlorpheniramine 4 mg at bedtime ( Over the counter) This will make you sleepy, do not drive if sleepy Avoid mint, menthol, spearmint. We will prescribe the Duonebs nebulizer medication , use twice daily for wheezing and shortness of breath. Follow up with [name] 05/31/2018. Dupixent injection as scheduled 04/16/2018. Please contact office for sooner follow up if symptoms do not improve or worsen or seek emergency care     Asthma Slow to resolve flare Plan Continue your Zyrtec and Singulair every day. Continue your Dulera 2 puffs twice daily. Rinse mouth after use. Follow up with [name] 05/31/2018. Dupixent injection as scheduled 04/16/2018. Please contact office for sooner follow up if symptoms do not improve or worsen or seek emergency care   Oral thrush Flare Plan Rinse mouth after use of all inhaled corticosteroids Magic Mouthwash 5 cc's  3 x daily, swish and swallow.    Magdalen Spatz, NP 04/09/2018  2:25 PM

## 2018-04-16 ENCOUNTER — Ambulatory Visit (INDEPENDENT_AMBULATORY_CARE_PROVIDER_SITE_OTHER): Payer: Medicare Other

## 2018-04-16 DIAGNOSIS — J45909 Unspecified asthma, uncomplicated: Secondary | ICD-10-CM | POA: Diagnosis not present

## 2018-04-18 MED ORDER — DUPILUMAB 300 MG/2ML ~~LOC~~ SOSY
300.0000 mg | PREFILLED_SYRINGE | Freq: Once | SUBCUTANEOUS | Status: AC
  Administered 2018-04-18: 300 mg via SUBCUTANEOUS

## 2018-04-23 ENCOUNTER — Ambulatory Visit (HOSPITAL_BASED_OUTPATIENT_CLINIC_OR_DEPARTMENT_OTHER): Payer: Medicare Other | Attending: Cardiovascular Disease | Admitting: Cardiovascular Disease

## 2018-04-23 VITALS — Ht 63.0 in | Wt 148.0 lb

## 2018-04-23 DIAGNOSIS — Z79899 Other long term (current) drug therapy: Secondary | ICD-10-CM | POA: Diagnosis not present

## 2018-04-23 DIAGNOSIS — R0683 Snoring: Secondary | ICD-10-CM | POA: Insufficient documentation

## 2018-04-23 DIAGNOSIS — Z79890 Hormone replacement therapy (postmenopausal): Secondary | ICD-10-CM | POA: Insufficient documentation

## 2018-04-23 DIAGNOSIS — R0902 Hypoxemia: Secondary | ICD-10-CM | POA: Diagnosis present

## 2018-04-23 DIAGNOSIS — G478 Other sleep disorders: Secondary | ICD-10-CM | POA: Diagnosis not present

## 2018-04-23 DIAGNOSIS — R002 Palpitations: Secondary | ICD-10-CM | POA: Diagnosis not present

## 2018-04-23 DIAGNOSIS — Z7982 Long term (current) use of aspirin: Secondary | ICD-10-CM | POA: Insufficient documentation

## 2018-04-23 DIAGNOSIS — I10 Essential (primary) hypertension: Secondary | ICD-10-CM | POA: Insufficient documentation

## 2018-04-23 DIAGNOSIS — R351 Nocturia: Secondary | ICD-10-CM | POA: Insufficient documentation

## 2018-04-24 ENCOUNTER — Telehealth: Payer: Self-pay | Admitting: Internal Medicine

## 2018-04-24 ENCOUNTER — Other Ambulatory Visit: Payer: Self-pay | Admitting: Adult Health

## 2018-04-24 NOTE — Telephone Encounter (Signed)
1 prefilled syringe Arrival Date: 04/24/2018 Lot #:[id number] Exp date: 01/2020  Pt. Orders med.[name]

## 2018-04-30 ENCOUNTER — Ambulatory Visit: Payer: Medicare Other

## 2018-04-30 ENCOUNTER — Ambulatory Visit (INDEPENDENT_AMBULATORY_CARE_PROVIDER_SITE_OTHER): Payer: Medicare Other

## 2018-04-30 DIAGNOSIS — J45909 Unspecified asthma, uncomplicated: Secondary | ICD-10-CM

## 2018-05-02 MED ORDER — DUPILUMAB 300 MG/2ML ~~LOC~~ SOSY
300.0000 mg | PREFILLED_SYRINGE | Freq: Once | SUBCUTANEOUS | Status: AC
  Administered 2018-04-30: 300 mg via SUBCUTANEOUS

## 2018-05-13 ENCOUNTER — Encounter (HOSPITAL_BASED_OUTPATIENT_CLINIC_OR_DEPARTMENT_OTHER): Payer: Self-pay | Admitting: Cardiovascular Disease

## 2018-05-13 NOTE — Procedures (Signed)
Patient Name: Robyn, [name] Date: 04/23/2018 Gender: Female D.O.B: [birth year] Age (years): 42 Referring Provider: Shelva Majestic MD, ABSM Height (inches): 63 Interpreting Physician: Shelva Majestic MD, ABSM Weight (lbs): 148 RPSGT: Laren Everts BMI: 26 MRN: [id number] Neck Size: 15.00  CLINICAL INFORMATION Sleep Study Type: NPSG  Indication for sleep study: Hypertension, Snoring, Witnessed Apneas  Epworth Sleepiness Score: 7  SLEEP STUDY TECHNIQUE As per the AASM Manual for the Scoring of Sleep and Associated Events v2.3 (April 2016) with a hypopnea requiring 4% desaturations.  The channels recorded and monitored were frontal, central and occipital EEG, electrooculogram (EOG), submentalis EMG (chin), nasal and oral airflow, thoracic and abdominal wall motion, anterior tibialis EMG, snore microphone, electrocardiogram, and pulse oximetry.  MEDICATIONS     albuterol (PROVENTIL HFA;VENTOLIN HFA) 108 (90 Base) MCG/ACT inhaler             aspirin EC 81 MG tablet         calcium-vitamin D (OSCAL WITH D) 500-200 MG-UNIT tablet         carbamazepine (TEGRETOL) 200 MG tablet         cetirizine (ZYRTEC ALLERGY) 10 MG tablet         Cholecalciferol (VITAMIN D PO)         CINNAMON PO         diazepam (VALIUM) 2 MG tablet         diltiazem (CARDIZEM CD) 240 MG 24 hr capsule (Expired)         DULERA 200-5 MCG/ACT AERO         DUPIXENT 300 MG/2ML SOSY         ipratropium-albuterol (DUONEB) 0.5-2.5 (3) MG/3ML SOLN         isosorbide mononitrate (IMDUR) 60 MG 24 hr tablet         levothyroxine (SYNTHROID, LEVOTHROID) 25 MCG tablet         losartan (COZAAR) 50 MG tablet         magic mouthwash SOLN         meclizine (ANTIVERT) 25 MG tablet         montelukast (SINGULAIR) 10 MG tablet         Multiple Vitamin (MULTIVITAMIN WITH MINERALS) TABS tablet         NITROSTAT 0.4 MG SL tablet         pantoprazole (PROTONIX) 20 MG tablet         PARoxetine (PAXIL) 10 MG tablet       Respiratory Therapy Supplies (FLUTTER) DEVI         VYTORIN 10-20 MG per tablet       Medications self-administered by patient taken the night of the study : BABY ASPIRIN, ZYRTEC, MONTELUKAST, DULERA, OLLY MELATONIN  SLEEP ARCHITECTURE The study was initiated at 10:58:59 PM and ended at 5:16:32 AM.  Sleep onset time was 43.0 minutes and the sleep efficiency was 59.7%%. The total sleep time was 225.5 minutes.  Stage REM latency was 63.5 minutes.  The patient spent 5.3%% of the night in stage N1 sleep, 78.9%% in stage N2 sleep, 0.4%% in stage N3 and 15.3% in REM.  Alpha intrusion was absent.  Supine sleep was 13.53%.  RESPIRATORY PARAMETERS The overall apnea/hypopnea index (AHI) was 1.1 per hour. The respiratory disturbance index (RDI) was 4.0/h.  There were 1 total apneas, including 0 obstructive, 1 central and 0 mixed apneas. There were 3 hypopneas and 11 RERAs.  The AHI during Stage REM sleep was 7.0  per hour.  AHI while supine was 0.0 per hour.  The mean oxygen saturation was 92.5%. The minimum SpO2 during sleep was 88.0%.  Soft snoring was noted during this study.  CARDIAC DATA The 2 lead EKG demonstrated sinus rhythm. The mean heart rate was 59.2 beats per minute. Other EKG findings include: None.  LEG MOVEMENT DATA The total PLMS were 0 with a resulting PLMS index of 0.0. Associated arousal with leg movement index was 0.0 .  IMPRESSIONS - No significant overall obstructive sleep apnea occurred during this study (AHI 1.1/h; RDI 4.0/h); however, very mild sleep apnea was present during REM sleep (AHI 7.0/h) - No significant central sleep apnea occurred during this study (CAI = 0.3/h). - The patient had minimal oxygen desaturation to a nadir of 88%. - The patient snored with soft snoring volume. - No cardiac abnormalities were noted during this study. - Clinically significant periodic limb movements did not occur during sleep. No significant associated  arousals.  DIAGNOSIS - Nocturnal Hypoxemia (327.26 [G47.36 ICD-10]) - Snoring - UARS  RECOMMENDATIONS - At present, there is no indication for CPAP therapy.  - Effort should be made to optimize nasal and oropharyngeal patency. - Avoid alcohol, sedatives and other CNS depressants that may worsen sleep apnea and disrupt normal sleep architecture. - Sleep hygiene should be reviewed to assess factors that may improve sleep quality. - Weight management and regular exercise should be initiated or continued if appropriate.  [Electronically signed] 05/13/2018 12:39 PM  Shelva Majestic MD, [facility], ABSM Diplomate, American Board of Sleep Medicine   NPI: [id number] [place] PH: [phone]   FX: [phone] [place]

## 2018-05-14 ENCOUNTER — Ambulatory Visit: Payer: Medicare Other

## 2018-05-15 ENCOUNTER — Telehealth: Payer: Self-pay | Admitting: Internal Medicine

## 2018-05-16 NOTE — Telephone Encounter (Signed)
Will forward to TS

## 2018-05-17 ENCOUNTER — Ambulatory Visit (INDEPENDENT_AMBULATORY_CARE_PROVIDER_SITE_OTHER): Payer: Medicare Other

## 2018-05-17 DIAGNOSIS — J45909 Unspecified asthma, uncomplicated: Secondary | ICD-10-CM

## 2018-05-17 NOTE — Telephone Encounter (Signed)
Pt was rescheduled, I gave her her Dupixent. Nothing further needed.

## 2018-05-18 ENCOUNTER — Other Ambulatory Visit: Payer: Self-pay | Admitting: Endocrinology

## 2018-05-18 DIAGNOSIS — E041 Nontoxic single thyroid nodule: Secondary | ICD-10-CM

## 2018-05-21 ENCOUNTER — Telehealth: Payer: Self-pay | Admitting: *Deleted

## 2018-05-21 NOTE — Telephone Encounter (Signed)
Patient notified of sleep study results. 

## 2018-05-22 MED ORDER — DUPILUMAB 300 MG/2ML ~~LOC~~ SOSY
300.0000 mg | PREFILLED_SYRINGE | Freq: Once | SUBCUTANEOUS | Status: AC
  Administered 2018-05-17: 300 mg via SUBCUTANEOUS

## 2018-05-25 ENCOUNTER — Telehealth: Payer: Self-pay | Admitting: Internal Medicine

## 2018-05-25 ENCOUNTER — Ambulatory Visit
Admission: RE | Admit: 2018-05-25 | Discharge: 2018-05-25 | Disposition: A | Discharge status: Home / Self Care | Payer: Medicare Other | Source: Ambulatory Visit | Attending: Endocrinology | Admitting: Endocrinology

## 2018-05-25 DIAGNOSIS — E041 Nontoxic single thyroid nodule: Secondary | ICD-10-CM

## 2018-05-25 NOTE — Telephone Encounter (Signed)
Called [facility], in case pt didn't order Dupixent. That way it would still have time to get here. Pt ordered, waiting on Dupixent to arrive 05/29/2018 so I can put it in smart.

## 2018-05-25 NOTE — Telephone Encounter (Signed)
Called pt., to ask her if she ordered her Dupixent. Lm with vm.. I don't have med for her appt. 05/30/2018.

## 2018-05-25 NOTE — Telephone Encounter (Signed)
Waiting for pt to call back.

## 2018-05-29 NOTE — Telephone Encounter (Signed)
1 prefilled syringe Arrival Date: 05/29/2018 Lot #:[id number] Exp date: 03/2020    Pt ordered med.[name]

## 2018-05-31 ENCOUNTER — Ambulatory Visit (INDEPENDENT_AMBULATORY_CARE_PROVIDER_SITE_OTHER): Payer: Medicare Other

## 2018-05-31 ENCOUNTER — Ambulatory Visit: Payer: Medicare Other | Admitting: Internal Medicine

## 2018-05-31 ENCOUNTER — Encounter: Payer: Self-pay | Admitting: Internal Medicine

## 2018-05-31 VITALS — BP 120/70 | HR 71 | Ht 63.0 in | Wt 150.0 lb

## 2018-05-31 DIAGNOSIS — J45909 Unspecified asthma, uncomplicated: Secondary | ICD-10-CM

## 2018-05-31 DIAGNOSIS — J82 Pulmonary eosinophilia, not elsewhere classified: Secondary | ICD-10-CM | POA: Diagnosis not present

## 2018-05-31 DIAGNOSIS — J8283 Eosinophilic asthma: Secondary | ICD-10-CM

## 2018-05-31 MED ORDER — DUPILUMAB 300 MG/2ML ~~LOC~~ SOSY
300.0000 mg | PREFILLED_SYRINGE | Freq: Once | SUBCUTANEOUS | Status: AC
  Administered 2018-05-31: 300 mg via SUBCUTANEOUS

## 2018-05-31 NOTE — Progress Notes (Signed)
On the med. Charge [name] should be 2 not 300. That's why I deleted it.

## 2018-05-31 NOTE — Patient Instructions (Signed)
ICD-10-CM   1. Moderate asthma, unspecified whether complicated, unspecified whether persistent J45.909   2. Eosinophilic asthma ([place]) J50     Improved and well controlled asthma  Plan - continue dupixent; highly recommend against stopping this  - I have reluctantly agreed to you giving a trial off inhalers  - esp with COVID-19 lurking  - practice extreme social distancing next 8-10 weeks for COVID-19 panedemic  Followup 3-5 months or sooner; ACQ/Feno at followup

## 2018-05-31 NOTE — Progress Notes (Signed)
OV 11/30/2016  Chief Complaint  Patient presents with  . Follow-up    Pt here after acute visit with TP on 8.31.2018. Pt states she is feeling improved but not back to baseline. Pt states she is still having prod cough with pale yellow mucus, still some SOB. Pt denies CP/tightness, f/c/s.      74 year old female with moderate persistent asthma. Last seen 11/25/2013 by her practitioner. Given prednisone and Z-Pak. Currently she is improved. Pulmonary function test yesterday shows normalized FEV1 and DLCO. Nitric oxide was high 3 weeks ago. She is here with [name]. Her son has graduated from anesthesia residency at the [employer]. She is worried about repeated it takes of steroids. She admitted to noncompliance with Symbicort and Singulair up until May 2018. She is also worried about her monoclonal antibody infusion for multiple sclerosis which she gets every 6 months for the last year or 2. She says after that she gets extremely fatigued and might require a prednisone burst. Occasionally she is not a respiratory infection. However she's been advised by her neurologist to continue with these injections in order to support her multiple sclerosis. There are no other new issues. She had a 7 mm lung nodule that resolved a year ago  73 year old female never smoker followed for moderate persistent asthma and lung nodule. MS on Infusion -OCREVUS every 6 months  Son is an anesthesiologist   01/13/2017 Follow up : Asthma  Pt returns for follow up for Asthma . She was seen on 12/27/16 for asthma bronchitic flare with 3 week hx of productive cough with thick yellow mucus and wheezing . Her FENO testing was elevated . She was treated with Zpack and Prednisone taper. Says she did get better with less cough, congestion and wheezing . She is concerned because she gets intermittent wheezing especially in am. Goes away after she uses Symbicort.  No fever, discolored mucus, chest pain,  orthopnea,.  She remains on Symbicort, Singulair and Zyrtec. Says she is compliant .  She says she and family are concerned regarding steroids use. We went over steroid use and potential side effects and goal is to limit use as much as possible . Last ov Nucala was ordered and is under approval process . This was added to help with improved asthma control .  She is concerned for adrenal insufficiency , discussed that it can occur with prolonged steroid use. She has been on three 1 week steroid tapers over last 4 months . Suggested she discuss this with PCP to have additional testing or referral to endocrinology if indicated.  FENO testing today is slightly improved at 150 .     Acute 01/27/17 73 year old with history of persistent asthma, multiple sclerosis on  complains of increasing dyspnea for the past 1 week associated with yellow mucus, wheezing, chest tightness.  She denies any nebulizer but has not received them yet.  Paperwork is underway for initiation of nucala but has not been started yet.   She has significant history of GERD with esophageal spasm, atypical chest pain.  She follows at [facility] for this.  She is taking Protonix alternating with Zyrtec.     OV 02/08/2017  Chief Complaint  Patient presents with  . Follow-up    Pt last seen by PM on 11.2.2018 for an acute visit. Pt states she started to imporve but now is feeling like she is catching a cold again. Pt c/o prod cough with yellow mucus. Pt  deniee SOB, CP/tihgtness and f/c/s.      74 year old female with eosinophilic and allergic asthma.  Presents for follow-up.  Most recently seen January 27, 2017 by my colleague for asthma exacerbation.  She is on her last day of prednisone today.  She feels improved.  Her nitric oxide has been the lowest it has been in 2018 which is 115 ppb but still significantly elevated.  She feels she is catching a cold although she feels overall better.  She is on Symbicort schedule and Singulair  scheduled.  She is not on any inhaled anticholinergic.  She is awaiting her interleukin-5 receptor antibody treatment.  She is worried about taking this because of cross interference with the other monoclonal antibody that she takes for her multiple sclerosis.  We had a discussion on this.  I did tell her that the cross-reactive and side effect profile is unknown and the general unpredictability can be higher.  Despite this we thought that the overall risk benefit ratio is in favor of her taking her interleukin-5 receptor antibody for asthma.   TEST  CT chest 01/2016 7 mm nodule resolved, stable other nodules , improved tree in bud nodularity  09/2016 IgE 4, neg RAST  09/2016 Eosinophils 300-400  Feno >.150 10/2016 >187 12/27/16  PFT 11/2016 >nml  FEV 1 , no restriction or obstruction , nml DLCO   OV 04/19/2017  Chief Complaint  Patient presents with  . Follow-up    has had 2 ED visit in December 2018 with asthma/bronchitis,no admission,feels like breathing at this time is better,she can tell improvement   Follow-up allergic asthma with eosinophilic asthma but normal IgE  She is here with [name] who runs a daycare for after school kids at [facility].  Her son is no longer at the [employer] but is now working in [place].  She tells me currently her asthma is stable.  She and her daughter are very categorical that the whole asthma started after she started taking monoclonal antibody infusions for her multiple sclerosis.   there is an infusion administered over 6 hours every 6 months.  She first started this infusion around November 2017.  She tells me that she has infusion reactions that start within a day or so of receiving the infusion.  These infusion reactions are characterized by body ache and fever and aggravation of past medical issues such as the meniscus.  She also has throat irritation and bronchospasm.  Then some 2 weeks later like clockwork she gets acute bronchitis which  is then treated as an asthma exacerbation.  She also has significant fatigue.  She says it takes her months to recover from this and she has a normal good 5 months before the cycle is repeated again.  Most recently the infusion was in November 2018 and then she ended up in the ER for asthma exacerbation in December 2018.  She was supposed to start interleukin-5 receptor antibody for asthma that was poorly controlled and because of persistent eosinophilia and high nitric oxide but given the cycle she and her daughter have decided against Nucala especially because she is convinced it is the [place] for multiple sclerosis that is causing this problem.  She is going to skip her next multiple sclerosis infusion in May 2019.  At this point in time she is compliant with her Symbicort.  Med review shows coreg   OV 05/10/2017  Chief Complaint  Patient presents with  . Acute Visit  Pt has complaints of coughing with thick yellow mucus and occ. dark brown mucus along with wheezing and hoarseness. Pt denies any fever.   15 -year-old female with asthma eosinophilic and moderate persistent on Symbicort.  She presents for an acute visit.  Since seeing me 3 weeks ago she had gradual deterioration in symptoms.  She is having significant nocturnal symptoms.  Her friends are telling her that her voice is hoarse.  She says she is compliant with her Symbicort and Singulair.  Her interleukin-5 receptor antibody is on hold because she is worried about interactions with the monoclonal antibody for multiple sclerosis still in her system and the washout of that is expected only June 2019.  Currently her asthma control questionnaire shows a score of 3.6 showing significant symptoms.  She is waking up a few times at night because of asthma symptoms and when she wakes up she has moderate symptoms.  She is moderately limited in her activities because of asthma.  She is short of breath quite a bit and wheezing all the time although  she hold off on using her albuterol for rescue.  Med review shows that she is on nonspecific beta blocker carvedilol.  There is no fever or chills.    OV 06/08/2017  Chief Complaint  Patient presents with  . Follow-up    Pt has had problems with side effects from the spiriva due to eye problems and dry mouth.  Breo and spiriva had helped with the wheezing but due to side effects, meds were stopped x2 days. States she had a coughing fit with the meds as well. Pt has c/o cough with yellow mucus, SOB, and is back to wheezing.   73 year old female with asthma eosinophilic and moderate persistent.  At last visit she was in acute visit.  I added Spiriva to her regimen.  Switch to Symbicort 2 higher dose Brio.  I asked her to continue her Singulair.  She still was not interested in her biologic therapy given her issues doing biologic therapy with multiple sclerosis.  We also had her talk to her physician and get her nonspecific beta-blocker carvedilol switched out.  She did all this.  However she tells me that Spiriva is causing dry mouth and she stopped it like a week ago.  For some unclear reason she also stopped the Brio for a few days a week ago and then again for a few days earlier this week and then starting yesterday or like 2 days ago started having worsening asthma symptoms.  She is now reporting significant cough.  When she wakes up she has mild symptoms when she is active she is very slightly limited because of her asthma.  She is short of breath a moderate amount because of the asthma and is wheezing a moderate amount because of the asthma and in the last 2 days most of the time and she is now using nebulizer for rescue 2 times daily.  Average asthma control question as 2.2.  Her exact nitric oxide test continues to be elevated at 156 as always.  She is now accepting that she will have to go through biologic therapy.  Her daughter status with her.  The questioning alternative etiologies.  She tells me  that she might have mold in the house.  Review of the labs show she had normal allergy blood test in July 2018 and a clear chest x-ray in December 2018 [CT chest in November showed tree-in-bud in the right upper lobe 1  month earlier] IgE was normal back in July 2018.  Aspergillus panel never checked.   07/06/2017  Pt. Was seen for an acute flare 06/08/2017 after stopping her Breo. At that visit, Dr. Golden Pop plan was as follows: Plan Retest for ABPA             - check IgE, aspergillus antibody blood test , do CXR Take prednisone 40 mg daily x 2 days, then 20mg  daily x 2 days, then 10mg  daily x 2 days, then 5mg  daily x 2 days and stop Start dulera high dose - you need to be on inhaled steroids No spiriva for you Continue singulair Glad coreg got changed out Start Nucala starting next week FENO at this visit was 156 ppb  Followup 4-6 weeks or sooner if needed with myself or APP             - feno and ACQ at follow up  Pt. Presents today . She states she completed he prednisone taper . She has been compliant with her Dulera, Singulair and Zyrtec. She has not had to use her rescue inhaler. She is not coughing. She states maybe once daily. Her cough is productive for yellow secretions on the occasion she does cough. She has had 1 Nucala injection.She did have some fatigue 1 week after the injection. It lasted x 1 day. She did have some mild arm tenderness one week after the injection. She does feel she is better than she was last month.She denies fever, chest pain, orthopnea or hemoptysis.    OV 08/08/2017  Chief Complaint  Patient presents with  . Follow-up    Pt states she is coughing up a lot of yellow mucus in the mornings. Other than the cough, pt states she has been doing good.    [name] presents for follow-up.  She has severe eosinophilic asthma that is poorly controlled with persistently significant high elevated nitric oxide levels  I personally saw her in mid March  2019.  At that time I recommended starting nucala.  She is now had 2 doses 4 weeks apart of that same injection.  Today is the third dose.  She says for the last few weeks she is noticing increased sputum production and loosening of the sputum and a yellow color in the sputum.  She feels that it is because the injection is working.  She does not feel it is the asthma exacerbation.  Her exam nitric oxide continues to be very high at 162 today.  She says she is compliant with her Dulera.  In terms of her multiple sclerosis she is coming off Ocrivis and going to sart on TYSABRI.  the care for this is being coordinated by Dr. Gorden Harms.  And apparently the neurologist was wondering about she says that between the last visit in this current visit she got her immunoglobulin checked by him and all these levels were low the correlation of that and asthma.  With in our labs we do notice that the IgE levels are low.  Currently she is not running any fever or chills or orthopnea or active wheezing   OV 10/16/2017  Chief Complaint  Patient presents with  . Follow-up    Pt states she has been doing well since last OV with Wyn Quaker, NP but states she is still coughing up phlegm.    Aryia Delira presents for follow-up.  She has severe persistent asthma with eosinophila asthma that is poorly controlled with persistently significant high elevated  nitric oxide levels usually > 150. On Nucala since mid-march 2019   Since I saw her last 2 months ago she's had an exacerbation requiring prednisone. She continues on interleukin-5 receptor antibody subcutaneous injections every 4 weeks since mid March 2019. Currently she feels better than before asthma control questionnaire is1.2 and is on the better score she's had over time.However nitric oxide test is significantly elevated and worse and now in the 200s. She is very puzzled by this. She denies anyone infestation. Most recent eosinophil count to show improvement. But she  still overall very symptomatic and still requiring prednisone. We discussed the switch to another biologic dupulimab and she is open to this idea    OV 11/30/2017  Subjective:  Patient ID: [name], female , DOB: 05/01/1945 , age 35 y.o. , MRN: [id number] , ADDRESS: [place] [facility] [id number]   11/30/2017 -   Chief Complaint  Patient presents with  . Follow-up    Pt states she has been doing good since last visit except states she has had a headache x2 days and has had a cough. Pt also has c/o chest tightness.     HPI JOLA CRITZER 73 y.o. -follow-up severe persistent asthma with poorly controlled symptoms. She is on Dulera, Singulair and biologic Nucala. She reports that currently it is one of her better days and weeks. Despite that asthma control questionnaire shows significant symptom with the 5 point score of 1.8. She says she is not waking up in the middle of the night because of asthma when she wakes up she is very mild symptoms when she is very slightly limited in her activities and she is moderately short of breath and wheezing a lot of the time but not using much albuterol for rescue. This is despite compliance. Last visit we switched her to [place] but this is stuck with insurance pre-authorization required and that is some delays with this. In addition she also tells me that at every visit she's been giving sputum samples at our lab basementand she is unclear why no one called her with results. I have told her that I was unaware that this was going on. Review of the charts indicate sputum samples given October 2018 in April 2019. Along and consistent with a respiratory in asthma symptoms at that early morning she has cough with yellow sputum which is consistent with high airway eosinophil load as evidenced by significantly high exhaled nitric oxide is despite biologic therapy.  [name]  ROS - per HPI       OV 03/01/2018  Subjective:  Patient ID: SASCHA PALMA,  female , DOB: May 13, 1945 , age 71 y.o. , MRN: [id number] , ADDRESS: [facility] [id number]   03/01/2018 -   Chief Complaint  Patient presents with  . Follow-up    f/u asthma, no wheezing since dupixent     HPI DARON BREEDING 73 y.o. -  Follow-up severe eosinophilic asthma on Dupixent.  She is taking the shots in her arm every 2 weeks.  This is been going on for 8 weeks or so.  She reports excellent improvement in her asthma.  Asthma control questionnaire 0 out of 5.  Significant improvement in her exam nitric oxide today going from the mid 100s to 43.  She continues to be on Dulera and Singulair.  She is asking if she can de-escalate any of this therapy.  She is not waking up in the middle of the night  with asthma.  She not having any symptoms when she wakes up.  Not limited in her activities.  When she wakes up she not short of breath.  No wheezing.  She not using albuterol for rescue.  She thinks she might be getting a cold but she is not sure.  Nitric oxide and symptom profile shows huge improvements.  However she is wondering if she is having side effects from the dupilumab.  She is reporting some blurred vision and needing to use reading glasses more so after starting the dupilumab.  This is temporally correlated.  Review of the literature shows conjunctivitis and pruritus reported at low percentages but not blurred vision.  OV 05/31/2018  Subjective:  Patient ID: LILYONNA STEIDLE, female , DOB: [birth year] , age 68 y.o. , MRN: [id number] , ADDRESS: [facility] [id number]   05/31/2018 -   Chief Complaint  Patient presents with  . Follow-up    Pt states she has been doing okay since last visit and denies any current complaints of cough, SOB, or CP     HPI DORTHIE SANTINI 73 y.o. -presents for follow-up.  She has severe asthma with eosinophilia.  Finally she is on Dupixent/in December 2019 she had asthma exacerbation and ended up in the ER and needed prednisone  but after that she is been doing well.  Asthma control question is 0.  She not waking up in the middle of the night with symptoms when she wakes up she is asymptomatic.  No limitations in activities no albuterol rescue use.  No wheezing no shortness of breath no cough.  Asthma control question is 0.  Exam nitric oxide is further improved to 22.  However she wants to stop the Dupixent because she feels she is getting a brain fog although she states that it could be related to [place].  She also wants to start her MS medications and is worried about the effects of polypharmacy.  She also discussed because of the upcoming coronavirus 19 pandemic.         Ref. Range 10/07/2016 12:29 05/10/2017  06/08/2017 Start nucala 08/08/2017  10/16/2017   11/30/2017  03/01/2018 On dupixemen 05/31/2018   Nitric Oxide Unknown 180 153 156 162 237 158 43 22  ACQ score   3.6   2.2  1.2 1.8 0 0     Ref. Range 05/10/2009 04:32 10/07/2016 13:11 10/07/2016 20:51 03/19/2017 15:45 08/08/2017 11:32  Eosinophils Absolute Latest Ref Range: 0.0 - 0.7 K/uL 0.1 0.4 0.3 0.3 0.0    Ref. Range 10/07/2016 13:11  IgEcoming (Immunoglobulin E), Serum Latest Ref Range: <115 kU/L 4       ROS - per HPI     has a past medical history of Anemia, Anginal pain ([place]), Arthritis, Complication of anesthesia, Coronary artery disease, Dry eyes, Esophageal spasm, GERD (gastroesophageal reflux disease), and MS (multiple sclerosis) ([place]).   reports that she has never smoked. She has never used smokeless tobacco.  Past Surgical History:  Procedure Laterality Date  . ABDOMINAL HYSTERECTOMY    . BLEPHAROPLASTY Bilateral   . CARDIAC CATHETERIZATION  10/04/06   MINOR CAD,SINGLE VESSEL INVOLVING THE CIRCUMFLEX. 20 TO 30% PROXIMALLY AND 10 TO 20% IN THE MIDDLE SEGMENT.MILD MUSCLE BRIDGING, MID LAD.NORMAL RCA.NORMAL LV FUNCTION.NORMAL MITRAL AND AORTIC VALVE.NORMAL APPEARING AORTA,THORACIC AND ABDOMINAL.NORMAL RENAL ARTERIES.  [name] CARDIOLOGY NUCLEAR MED  STUDY  06/22/12   NL LV FUNCTION,EF 68%,NL WALL MOTION.  [name] CAROTID DUPLEX  07/02/11   FTD:DUKG SOFT PLAQUE  NOTED DISTAL CCA AND ORGIN AND PROXIMAL ICA,LEFT>RIGHT.NO ICA STENOSIS. VERTEBRAL ARTERY FLOW IS ANTEGRADE.  [name] CESAREAN SECTION     x2   . COLONOSCOPY WITH PROPOFOL N/A 12/11/2014   Procedure: COLONOSCOPY WITH PROPOFOL;  Surgeon: Ronald Lobo, MD;  Location: [facility];  Service: Endoscopy;  Laterality: N/A;  . KNEE ARTHROSCOPY Left    scope  . PARATHYROIDECTOMY     partial-many years ago  . thumb surgery Bilateral    built up and bone removal  . TONSILLECTOMY    . TRANSTHORACIC ECHOCARDIOGRAM  07/02/11   LV CAVITY SIZE IS NORMAL. SYSTOLIC FUNCTION WAS NORMAL.EF=55% TO 60%.INCREASED RELATIVE CONTRIBUTION OF ATRIAL CONTRACTION TO VENTRICULAR FILLING;MAYBE DUE TO HYPOVOLEMIA. AV=MILD REGURG.    Allergies  Allergen Reactions  . Crestor [Rosuvastatin] Other (See Comments)    Joint pain   . Iodinated Diagnostic Agents Other (See Comments)    Sneezing and itchy throat; dye was Isovue 300  . Vicodin [Hydrocodone-Acetaminophen]     Made pass-out one time and is okay taking now  . Isovue [Iopamidol]     Pt had sneezing and itching of her throat and soft palate.  Dr Alvester Chou checked pt.  She will need premeds in the future.  J Bohm    Immunization History  Administered Date(s) Administered  . Influenza, High Dose Seasonal PF 12/22/2013, 12/14/2015, 12/14/2016, 11/30/2017  . Influenza,inj,Quad PF,6+ Mos 11/27/2014  . Pneumococcal Polysaccharide-23 10/26/2013  . Pneumococcal-Unspecified 10/26/2013  . Tdap 06/14/2011  . Zoster Recombinat (Shingrix) 11/13/2017, 02/08/2018    Family History  Problem Relation Age of Onset  . Heart disease Mother   . Asthma Father   . Bladder Cancer Father   . Heart failure Father   . Hyperlipidemia Brother      Current Outpatient Medications:  .  albuterol (PROVENTIL HFA;VENTOLIN HFA) 108 (90 Base) MCG/ACT inhaler, Inhale 1-2 puffs into the lungs  every 6 (six) hours as needed for wheezing or shortness of breath., Disp: 1 Inhaler, Rfl: 5 .  aspirin EC 81 MG tablet, Take 1 tablet (81 mg total) by mouth daily., Disp: 90 tablet, Rfl: 3 .  calcium-vitamin D (OSCAL WITH D) 500-200 MG-UNIT tablet, Take 1 tablet by mouth daily., Disp: , Rfl:  .  carbamazepine (TEGRETOL) 200 MG tablet, , Disp: , Rfl:  .  cetirizine (ZYRTEC ALLERGY) 10 MG tablet, Take 1 tablet (10 mg total) by mouth daily., Disp: 30 tablet, Rfl: 5 .  Cholecalciferol (VITAMIN D PO), Take 1 tablet by mouth daily., Disp: , Rfl:  .  CINNAMON PO, Take 1 tablet by mouth daily., Disp: , Rfl:  .  diazepam (VALIUM) 2 MG tablet, , Disp: , Rfl:  .  diltiazem (CARDIZEM CD) 240 MG 24 hr capsule, , Disp: , Rfl:  .  DULERA 200-5 MCG/ACT AERO, USE 2 PUFFS TWICE DAILY., Disp: 13 g, Rfl: 3 .  DUPIXENT 300 MG/2ML SOSY, , Disp: , Rfl:  .  ipratropium-albuterol (DUONEB) 0.5-2.5 (3) MG/3ML SOLN, Take 3 mLs by nebulization 2 (two) times daily., Disp: 360 mL, Rfl: 0 .  isosorbide mononitrate (IMDUR) 60 MG 24 hr tablet, Take 1 tablet (60 mg total) by mouth daily., Disp: 90 tablet, Rfl: 0 .  levothyroxine (SYNTHROID, LEVOTHROID) 25 MCG tablet, Take 25 mcg daily before breakfast by mouth., Disp: , Rfl:  .  losartan (COZAAR) 50 MG tablet, , Disp: , Rfl:  .  meclizine (ANTIVERT) 25 MG tablet, Take 1 tablet (25 mg total) by mouth 3 (three) times daily as needed for  dizziness., Disp: 30 tablet, Rfl: 0 .  montelukast (SINGULAIR) 10 MG tablet, TAKE ONE TABLET AT BEDTIME., Disp: 30 tablet, Rfl: 0 .  Multiple Vitamin (MULTIVITAMIN WITH MINERALS) TABS tablet, Take 1 tablet by mouth daily., Disp: , Rfl:  .  pantoprazole (PROTONIX) 20 MG tablet, TAKE 1 TABLET BY MOUTH TWICE DAILY., Disp: 90 tablet, Rfl: 3 .  PARoxetine (PAXIL) 10 MG tablet, Take 5 mg by mouth daily. , Disp: , Rfl:  .  Respiratory Therapy Supplies (FLUTTER) DEVI, Use as directed, Disp: 1 each, Rfl: 0 .  VYTORIN 10-20 MG per tablet, Take 1 tablet by  mouth daily. , Disp: , Rfl:  .  diltiazem (CARDIZEM CD) 240 MG 24 hr capsule, Take 1 capsule (240 mg total) by mouth daily., Disp: 90 capsule, Rfl: 3 .  magic mouthwash SOLN, Take 5 mLs by mouth 3 (three) times daily. (Patient not taking: Reported on 05/31/2018), Disp: 150 mL, Rfl: 0 .  NITROSTAT 0.4 MG SL tablet, PLACE 1 TAB UNDER THE TONGUE AS NEEDED FOR CHEST PAIN MAY REPEAT EVERY 5 MINUTES. (Patient not taking: Reported on 05/31/2018), Disp: 25 tablet, Rfl: 3  Current Facility-Administered Medications:  [name]  Mepolizumab SOLR 100 mg, 100 mg, Subcutaneous, Q28 days, Brand Males, MD, 100 mg at 06/14/17 0917 .  Mepolizumab SOLR 100 mg, 100 mg, Subcutaneous, Q28 days, Brand Males, MD, 100 mg at 07/12/17 0902 .  Mepolizumab SOLR 100 mg, 100 mg, Subcutaneous, Q28 days, Brand Males, MD, 100 mg at 10/31/17 0903      Objective:   Vitals:   05/31/18 1342  BP: 120/70  Pulse: 71  SpO2: 96%  Weight: 150 lb (68 kg)  Height: 5\' 3"  (1.6 m)    Estimated body mass index is 26.57 kg/m as calculated from the following:   Height as of this encounter: 5\' 3"  (1.6 m).   Weight as of this encounter: 150 lb (68 kg).  @WEIGHTCHANGE @  [employer]   05/31/18 1342  Weight: 150 lb (68 kg)     Physical Exam  General Appearance:    Alert, cooperative, no distress, appears stated age - yes , Deconditioned looking - no , OBESE  - no, Sitting on Wheelchair -  no  Head:    Normocephalic, without obvious abnormality, atraumatic  Eyes:    PERRL, conjunctiva/corneas clear,  Ears:    Normal TM's and external ear canals, both ears  Nose:   Nares normal, septum midline, mucosa normal, no drainage    or sinus tenderness. OXYGEN ON  - no . Patient is @ ra   Throat:   Lips, mucosa, and tongue normal; teeth and gums normal. Cyanosis on lips - no  Neck:   Supple, symmetrical, trachea midline, no adenopathy;    thyroid:  no enlargement/tenderness/nodules; no carotid   bruit or JVD  Back:      Symmetric, no curvature, ROM normal, no CVA tenderness  Lungs:     Distress - no , Wheeze no, Barrell Chest - no, Purse lip breathing - no, Crackles - no   Chest Wall:    No tenderness or deformity.    Heart:    Regular rate and rhythm, S1 and S2 normal, no rub   or gallop, Murmur - no  Breast Exam:    NOT DONE  Abdomen:     Soft, non-tender, bowel sounds active all four quadrants,    no masses, no organomegaly. Visceral obesity - no  Genitalia:   NOT DONE  Rectal:  NOT DONE  Extremities:   Extremities - normal, Has Cane - no, Clubbing - no, Edema - no  Pulses:   2+ and symmetric all extremities  Skin:   Stigmata of Connective Tissue Disease - no  Lymph nodes:   Cervical, supraclavicular, and axillary nodes normal  Psychiatric:  Neurologic:   Pleasant - yes, Anxious - no, Flat affect - no  CAm-ICU - neg, Alert and Oriented x 3 - yes, Moves all 4s - yes, Speech - normal, Cognition - intact           Assessment:       ICD-10-CM   1. Moderate asthma, unspecified whether complicated, unspecified whether persistent J45.909   2. 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 Moderate asthma, unspecified whether complicated, unspecified whether persistent J45.909   2. 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 Brand Males, M.D., F.C.C.P,  Pulmonary and Critical Care Medicine Staff Physician, [place] Director - Interstitial Lung Disease  Program  Pulmonary [place] at [place], [place], [address]  Pager: [phone], If no  answer or between  15:00h - 7:00h: call [phone] Telephone: [phone]  2:07 PM 05/31/2018

## 2018-06-07 ENCOUNTER — Other Ambulatory Visit: Payer: Self-pay | Admitting: Adult Health

## 2018-06-12 ENCOUNTER — Telehealth: Payer: Self-pay | Admitting: Internal Medicine

## 2018-06-13 NOTE — Telephone Encounter (Signed)
Pt want's to talk to [name] about her Dupixent inj on 3/19

## 2018-06-13 NOTE — Telephone Encounter (Signed)
This should have been routed to [employer], JJ or LR. I will do that when I type the note. Pt is concerned about being exposed to Coronavirus. I explained to her we are screening pts as they come in. She's worried about the pts that may have fibbed about being out of the country and or having sx.. Please advise. Pt is keeping appt. 06/14/2018, unless you advise her otherwise.

## 2018-06-14 ENCOUNTER — Ambulatory Visit (INDEPENDENT_AMBULATORY_CARE_PROVIDER_SITE_OTHER): Payer: Medicare Other

## 2018-06-14 ENCOUNTER — Other Ambulatory Visit: Payer: Self-pay

## 2018-06-14 DIAGNOSIS — J45909 Unspecified asthma, uncomplicated: Secondary | ICD-10-CM

## 2018-06-14 MED ORDER — DUPILUMAB 300 MG/2ML ~~LOC~~ SOSY
300.0000 mg | PREFILLED_SYRINGE | Freq: Once | SUBCUTANEOUS | Status: AC
  Administered 2018-06-14: 300 mg via SUBCUTANEOUS

## 2018-06-14 NOTE — Telephone Encounter (Signed)
Called and spoke with pt letting her know to keep her appt today for her Dupixent and that TP stated for her to wear a mask and go directly back to infusion clinic if possible so she would not have to wait in lobby.  Pt expressed understanding. Nothing further needed.

## 2018-06-14 NOTE — Telephone Encounter (Signed)
Would continue on Dupixent  Wear mask on entry to office , wash hands , go directly to infusion clinic if possible , do not wait in lobby , wash hand/gel often, social distancing .   Please contact office for sooner follow up if symptoms do not improve or worsen or seek emergency care

## 2018-06-19 ENCOUNTER — Telehealth: Payer: Self-pay | Admitting: Internal Medicine

## 2018-06-21 NOTE — Telephone Encounter (Signed)
I called [facility] To order pt's Dupixent. Pt's script exp.. Rep transferred me to a pharmacist and I did a verbal rx. Pharmacist said they will call the pt next and let pt know a new rx has been called in with 6 refills. Waiting for order confirmation from [facility].

## 2018-06-26 NOTE — Telephone Encounter (Signed)
1 prefilled syringe Arrival Date: 06/26/2018 Lot #:[id number] Exp date: 04/2020  Pt. Ordered.

## 2018-06-28 ENCOUNTER — Other Ambulatory Visit: Payer: Self-pay

## 2018-06-28 ENCOUNTER — Ambulatory Visit (INDEPENDENT_AMBULATORY_CARE_PROVIDER_SITE_OTHER): Payer: Medicare Other

## 2018-06-28 DIAGNOSIS — J45909 Unspecified asthma, uncomplicated: Secondary | ICD-10-CM | POA: Diagnosis not present

## 2018-06-28 MED ORDER — DUPILUMAB 300 MG/2ML ~~LOC~~ SOSY
300.0000 mg | PREFILLED_SYRINGE | Freq: Once | SUBCUTANEOUS | Status: AC
  Administered 2018-06-28: 11:00:00 300 mg via SUBCUTANEOUS

## 2018-07-02 ENCOUNTER — Other Ambulatory Visit: Payer: Self-pay | Admitting: Cardiovascular Disease

## 2018-07-12 ENCOUNTER — Encounter: Payer: Self-pay | Admitting: Internal Medicine

## 2018-07-12 ENCOUNTER — Other Ambulatory Visit: Payer: Self-pay

## 2018-07-12 ENCOUNTER — Ambulatory Visit (INDEPENDENT_AMBULATORY_CARE_PROVIDER_SITE_OTHER): Payer: Medicare Other

## 2018-07-12 DIAGNOSIS — J45909 Unspecified asthma, uncomplicated: Secondary | ICD-10-CM

## 2018-07-12 MED ORDER — DUPILUMAB 300 MG/2ML ~~LOC~~ SOSY
300.0000 mg | PREFILLED_SYRINGE | Freq: Once | SUBCUTANEOUS | Status: AC
  Administered 2018-07-12: 300 mg via SUBCUTANEOUS

## 2018-07-12 NOTE — Progress Notes (Signed)
Nicki Reaper, Tammy RMA  Have you been hospitalized within the last 10 days?  No Do you have a fever?  No Do you have a cough?  No Do you have a headache or sore throat?  No

## 2018-07-16 MED ORDER — DUPILUMAB 300 MG/2ML ~~LOC~~ SOSY
300.0000 mg | PREFILLED_SYRINGE | Freq: Once | SUBCUTANEOUS | Status: AC
  Administered 2018-07-12: 300 mg via SUBCUTANEOUS

## 2018-07-16 NOTE — Addendum Note (Signed)
Addended by: Alroy Bailiff B on: 07/16/2018 09:30 AM   Modules accepted: Orders

## 2018-07-19 ENCOUNTER — Telehealth: Payer: Self-pay | Admitting: Internal Medicine

## 2018-07-19 NOTE — Telephone Encounter (Signed)
Dupixent Shipment Received: 300mg  #1 prefilled syringe Medication arrival date: 07/19/2018 Lot #: [id number] Medication exp date: 05/2020 Received by: TBS  Pt orders med.[name]

## 2018-07-26 ENCOUNTER — Ambulatory Visit (INDEPENDENT_AMBULATORY_CARE_PROVIDER_SITE_OTHER): Payer: Medicare Other

## 2018-07-26 ENCOUNTER — Other Ambulatory Visit: Payer: Self-pay

## 2018-07-26 DIAGNOSIS — J45909 Unspecified asthma, uncomplicated: Secondary | ICD-10-CM

## 2018-07-26 MED ORDER — DUPILUMAB 300 MG/2ML ~~LOC~~ SOSY
300.0000 mg | PREFILLED_SYRINGE | Freq: Once | SUBCUTANEOUS | Status: AC
  Administered 2018-07-26: 300 mg via SUBCUTANEOUS

## 2018-07-26 NOTE — Progress Notes (Signed)
Have you been hospitalized within the last 10 days?  No Do you have a fever?  No Do you have a cough?  No Do you have a headache or sore throat? No  

## 2018-07-31 ENCOUNTER — Telehealth: Payer: Self-pay | Admitting: Cardiovascular Disease

## 2018-07-31 NOTE — Telephone Encounter (Signed)
LMTCB or respond to MyChart message to change visit to telephone or video.

## 2018-08-01 ENCOUNTER — Telehealth (INDEPENDENT_AMBULATORY_CARE_PROVIDER_SITE_OTHER): Payer: Medicare Other | Admitting: Cardiovascular Disease

## 2018-08-01 VITALS — BP 140/77 | HR 69 | Ht 63.0 in | Wt 145.0 lb

## 2018-08-01 DIAGNOSIS — E785 Hyperlipidemia, unspecified: Secondary | ICD-10-CM

## 2018-08-01 DIAGNOSIS — I10 Essential (primary) hypertension: Secondary | ICD-10-CM | POA: Diagnosis not present

## 2018-08-01 DIAGNOSIS — R002 Palpitations: Secondary | ICD-10-CM | POA: Diagnosis not present

## 2018-08-01 DIAGNOSIS — I2510 Atherosclerotic heart disease of native coronary artery without angina pectoris: Secondary | ICD-10-CM

## 2018-08-01 DIAGNOSIS — G478 Other sleep disorders: Secondary | ICD-10-CM

## 2018-08-01 DIAGNOSIS — J45909 Unspecified asthma, uncomplicated: Secondary | ICD-10-CM

## 2018-08-01 DIAGNOSIS — G35 Multiple sclerosis: Secondary | ICD-10-CM

## 2018-08-01 MED ORDER — LOSARTAN POTASSIUM 50 MG PO TABS: 75.0000 mg | ORAL_TABLET | Freq: Every day | ORAL | 1 refills | Status: DC

## 2018-08-01 NOTE — Patient Instructions (Signed)
Medication Instructions:  Increase Losartan to 1.5 tablets (75 mg) daily.  If you need a refill on your cardiac medications before your next appointment, please call your pharmacy.    Follow-Up: At [facility], you and your health needs are our priority.  As part of our continuing mission to provide you with exceptional heart care, we have created designated Provider Care Teams.  These Care Teams include your primary Cardiologist (physician) and Advanced Practice Providers (APPs -  Physician Assistants and Nurse Practitioners) who all work together to provide you with the care you need, when you need it. You will need a follow up appointment in 6 months.  Please call our office 2 months in advance to schedule this appointment.  You may see Dr.Kelly or one of the following Advanced Practice Providers on your designated Care Team: [name], [place] . Fabian Sharp, PA-C

## 2018-08-01 NOTE — Progress Notes (Signed)
Virtual Visit via Video Note   This visit type was conducted due to national recommendations for restrictions regarding the COVID-19 Pandemic (e.g. social distancing) in an effort to limit this patient's exposure and mitigate transmission in our community.  Due to her co-morbid illnesses, this patient is at least at moderate risk for complications without adequate follow up.  This format is felt to be most appropriate for this patient at this time.  All issues noted in this document were discussed and addressed.  A limited physical exam was performed with this format.  Please refer to the patient's chart for her consent to telehealth for [facility].   Date:  08/01/2018   ID:  [name], DOB 08/01/1945, MRN [id number]  Patient Location: Home Provider Location: Office  PCP:  [name], MD  Cardiologist:  Shelva Majestic, MD Electrophysiologist:  None   Evaluation Performed:  Follow-Up Visit  Chief Complaint:  4 month F/U  History of Present Illness:    [name] is a 73 y.o. female who has documented mild CAD by  cardiac catheterization in July 2008 by Dr. Roe Rutherford.  She had mild narrowing of 20-30% in the proximal and 10-20% in the mid left circumflex vessel.  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  She has been on medical therapy.  She also has a history of hypertension,.  Her last stress test was done in March 2014 were she had nonspecific T changes and nondiagnostic 0.5-1 mm inferolateral ST segment changes with stress.  Scintigraphic images revealed normal perfusion and function.  She has a history of multiple sclerosis and is followed by Dr. Delphia Grates in [place].  She has a history of hyperlipidemia for which she takes Vytorin 10/20 and GERD for which he takes over-the-counter Prilosec.   She has experienced recent episodes of chest pain which have been occurring almost weekly. She experiences squeezing in her arms and jaw.  She  denies chest pressure.  The symptoms are not associated with activity and typically resolve on her own.  She has taken nitroglycerin with questionable benefit.  She also notes calf discomfort at night while sleeping.  She denies restless legs.  She was started on a human monoclonal antibody ocrelizumab  for her multiple sclerosis and admits to improvement in symptomatology.  When I saw her last year she had experienced recurrent episodes of chest pain with some atypical features.  She underwent a nuclear perfusion study in December 2016 which revealed normal perfusion with an ejection fraction of 66%.  She developed recurrent chest pain in January and again was felt to be stable cardiovascularly.  She subsequently was evaluated at [facility] and was felt that her chest discomfort was due to esophageal lower esophageal sphincter spasm and inability to relax appropriately.  Her symptoms have improved with Protonix and she is now being weaned off Protonix and has been started on Zantac.  She has been evaluated on several occasions by [name], [facility] with his most recent evaluation in March 2019.  He had seen her in December 2018 with chest pain more consistent with esophageal spasm.  An echocardiogram in December 2018 showed an EF of 60 to 65% with grade 1 diastolic dysfunction, mild aortic stenosis with trivial AR.  She has had issues with asthma.  She also had experienced some palpitations and a 24-hour monitor revealed short bursts of SVT, PACs and PVCs.  Palpitations have improved though she still experiences some  palpitations at night while falling asleep.  She eats chocolate on a daily basis.  Her sleep is very poor.  She has frequent awakenings.  She snores.  She has nocturia at least 3 times per night.  He denies any chest pain.   I last saw her in September 2019 with her palpitations I recommended discontinuance of amlodipine and instituted Cardizem CD 240 mg.  We discussed avoidance of chocolate which  contains caffeine.  Due to concerns for sleep apnea I also recommended she undergo a sleep evaluation.  She has also seen pulmonary since her last evaluation with complaints of wheezing and on March 25, 2018 was evaluated in the emergency room with dizziness.  She had fallen over a dog gate and struck her head.  A head CT did not reveal any acute intracranial abnormality.  There was mild age-appropriate cortical atrophy with moderate to severe chronic microvascular ischemic changes of the white matter.  She is scheduled to undergo a sleep study on January 27.  She continues to experience occasional palpitations at night.  She recently was started on a prednisone taper due to her wheezing has had some chest congestion.  She underwent a sleep study on April 23, 2018.She was not found to have significant sleep apnea and her overall AHI was 1.1/h.  However,there was mildly sleep apnea during REM sleep with an AHI of 7.0/h.  There was evidence for soft snoring and her oxygen nadir was 88%.  She is no longer taking her multiple sclerosis infusion, due to exacerbation of asthmatic inflammation.  She is now on dupixant and is followed by [name].  She does have issues at times where she feels like she just cannot get it very deep breath.  She denies chest pain.  She denies palpitations.  She continues to see her doctor in [place] for her multiple sclerosis.   The patient does not have symptoms concerning for COVID-19 infection (fever, chills, cough, or new shortness of breath).    Past Medical History:  Diagnosis Date   Anemia    past history-many yrs ago   Anginal pain ([place])    being evaluated by Dr. Tyrone Sage, arm pain,"bad indigestion" -no heart related findings as of yet   Arthritis    hip. back pain   Complication of anesthesia    s/p Hysterectomy "vagal response "heart stopped" -did not require shocking.   Coronary artery disease    Dry eyes    Esophageal spasm    GERD  (gastroesophageal reflux disease)    MS (multiple sclerosis) ([place])    stable-sees Jeffrey every 6 months   Past Surgical History:  Procedure Laterality Date   ABDOMINAL HYSTERECTOMY     BLEPHAROPLASTY Bilateral    CARDIAC CATHETERIZATION  10/04/06   MINOR CAD,SINGLE VESSEL INVOLVING THE CIRCUMFLEX. 20 TO 30% PROXIMALLY AND 10 TO 20% IN THE MIDDLE SEGMENT.MILD MUSCLE BRIDGING, MID LAD.NORMAL RCA.NORMAL LV FUNCTION.NORMAL MITRAL AND AORTIC VALVE.NORMAL APPEARING AORTA,THORACIC AND ABDOMINAL.NORMAL RENAL ARTERIES.   CARDIOLOGY NUCLEAR MED STUDY  06/22/12   NL LV FUNCTION,EF 68%,NL WALL MOTION.   CAROTID DUPLEX  07/02/11   MPN:TIRW SOFT PLAQUE NOTED DISTAL CCA AND ORGIN AND PROXIMAL ICA,LEFT>RIGHT.NO ICA STENOSIS. VERTEBRAL ARTERY FLOW IS ANTEGRADE.   CESAREAN SECTION     x2    COLONOSCOPY WITH PROPOFOL N/A 12/11/2014   Procedure: COLONOSCOPY WITH PROPOFOL;  Surgeon: Ronald Lobo, MD;  Location: [facility];  Service: Endoscopy;  Laterality: N/A;   KNEE ARTHROSCOPY Left    scope  PARATHYROIDECTOMY     partial-many years ago   thumb surgery Bilateral    built up and bone removal   TONSILLECTOMY     TRANSTHORACIC ECHOCARDIOGRAM  07/02/11   LV CAVITY SIZE IS NORMAL. SYSTOLIC FUNCTION WAS NORMAL.EF=55% TO 60%.INCREASED RELATIVE CONTRIBUTION OF ATRIAL CONTRACTION TO VENTRICULAR FILLING;MAYBE DUE TO HYPOVOLEMIA. AV=MILD REGURG.     Current Meds  Medication Sig   albuterol (PROVENTIL HFA;VENTOLIN HFA) 108 (90 Base) MCG/ACT inhaler Inhale 1-2 puffs into the lungs every 6 (six) hours as needed for wheezing or shortness of breath.   aspirin EC 81 MG tablet Take 1 tablet (81 mg total) by mouth daily.   calcium-vitamin D (OSCAL WITH D) 500-200 MG-UNIT tablet Take 1 tablet by mouth daily.   carbamazepine (TEGRETOL) 200 MG tablet    cetirizine (ZYRTEC ALLERGY) 10 MG tablet Take 1 tablet (10 mg total) by mouth daily.   Cholecalciferol (VITAMIN D PO) Take 1 tablet by mouth daily.    CINNAMON PO Take 1 tablet by mouth daily.   diazepam (VALIUM) 2 MG tablet    diltiazem (CARDIZEM CD) 240 MG 24 hr capsule    DULERA 200-5 MCG/ACT AERO USE 2 PUFFS TWICE DAILY.   DUPIXENT 300 MG/2ML SOSY    ipratropium-albuterol (DUONEB) 0.5-2.5 (3) MG/3ML SOLN Take 3 mLs by nebulization 2 (two) times daily.   isosorbide mononitrate (IMDUR) 60 MG 24 hr tablet TAKE 1 TABLET ONCE DAILY.   levothyroxine (SYNTHROID, LEVOTHROID) 25 MCG tablet Take 25 mcg daily before breakfast by mouth.   losartan (COZAAR) 50 MG tablet    magic mouthwash SOLN Take 5 mLs by mouth 3 (three) times daily.   meclizine (ANTIVERT) 25 MG tablet Take 1 tablet (25 mg total) by mouth 3 (three) times daily as needed for dizziness.   montelukast (SINGULAIR) 10 MG tablet TAKE ONE TABLET AT BEDTIME.   Multiple Vitamin (MULTIVITAMIN WITH MINERALS) TABS tablet Take 1 tablet by mouth daily.   NITROSTAT 0.4 MG SL tablet PLACE 1 TAB UNDER THE TONGUE AS NEEDED FOR CHEST PAIN MAY REPEAT EVERY 5 MINUTES.   pantoprazole (PROTONIX) 20 MG tablet TAKE 1 TABLET BY MOUTH TWICE DAILY.   PARoxetine (PAXIL) 10 MG tablet Take 5 mg by mouth daily.    Respiratory Therapy Supplies (FLUTTER) DEVI Use as directed   VYTORIN 10-20 MG per tablet Take 1 tablet by mouth daily.    Current Facility-Administered Medications for the 08/01/18 encounter (Telemedicine) with Troy Sine, MD  Medication   Mepolizumab SOLR 100 mg   Mepolizumab SOLR 100 mg   Mepolizumab SOLR 100 mg     Allergies:   Crestor [rosuvastatin]; Iodinated diagnostic agents; Vicodin [hydrocodone-acetaminophen]; and Isovue [iopamidol]   Social History   Tobacco Use   Smoking status: Never Smoker   Smokeless tobacco: Never Used  Substance Use Topics   Alcohol use: Yes    Alcohol/week: 0.0 standard drinks    Comment: wine occ.   Drug use: No     Family Hx: The patient's family history includes Asthma in her father; Bladder Cancer in her father; Heart  disease in her mother; Heart failure in her father; Hyperlipidemia in her brother.  ROS:   Please see the history of present illness.    No fevers chills night sweats cough change in taste or smell No changes in vision Positive for asthma improved with Dupixant Positive for multiple sclerosis Occasional palpitations History of lower esophageal sphincter spasm No bleeding Mild snoring but sleeping adequately  All other systems  reviewed and are negative.   Prior CV studies:   The following studies were reviewed today:   PSG MPRESSIONS: 04/23/2018 - No significant overall obstructive sleep apnea occurred during this study (AHI 1.1/h; RDI 4.0/h); however, very mild sleep apnea was present during REM sleep (AHI 7.0/h) - No significant central sleep apnea occurred during this study (CAI = 0.3/h). - The patient had minimal oxygen desaturation to a nadir of 88%. - The patient snored with soft snoring volume. - No cardiac abnormalities were noted during this study. - Clinically significant periodic limb movements did not occur during sleep. No significant associated arousals.  DIAGNOSIS - Nocturnal Hypoxemia (327.26 [G47.36 ICD-10]) - Snoring - UARS  RECOMMENDATIONS - At present, there is no indication for CPAP therapy.  - Effort should be made to optimize nasal and oropharyngeal patency. - Avoid alcohol, sedatives and other CNS depressants that may worsen sleep apnea and disrupt normal sleep architecture. - Sleep hygiene should be reviewed to assess factors that may improve sleep quality. - Weight management and regular exercise should be initiated or continued if appropriate.   Labs/Other Tests and Data Reviewed:    EKG:  An ECG dated 04/02/2018 was personally reviewed today and demonstrated:  Normal sinus rhythm at 71 bpm.  PAC.  Normal intervals.  Nonspecific T wave abnormality.  Recent Labs: 03/25/2018: BUN 17; Creatinine, Ser 0.72; Hemoglobin 12.7; Platelets 193; Potassium  3.0; Sodium 136   Recent Lipid Panel No results found for: CHOL, TRIG, HDL, CHOLHDL, LDLCALC, LDLDIRECT  Wt Readings from Last 3 Encounters:  08/01/18 145 lb (65.8 kg)  05/31/18 150 lb (68 kg)  04/23/18 148 lb (67.1 kg)     Objective:    Vital Signs:  BP 140/77    Pulse 69    Ht 5\' 3"  (1.6 m)    Wt 145 lb (65.8 kg)    BMI 25.69 kg/m    She is well-developed and well-nourished in no acute distress Breathing is not labored There is no audible wheezing HEENT is unremarkable There does not appear to be JVD Her rhythm is predominantly regular.  At times she admits to an occasional palpitation There is no tenderness to chest wall palpation She denies any abdominal tenderness There is no edema Neurologically there are no acute findings She has normal affect    ASSESSMENT & PLAN:    1. CAD: No recurrent anginal symptoms with previously documented mild CAD involving her left circumflex vessel and systolic bridging of her mid LAD at catheterization in February 2008.  Last nuclear perfusion study 2016 normal. 2. Essential hypertension: Blood pressure today is mildly elevated based on most recent guidelines on Cardizem 240, losartan 50 mg, in addition to isosorbide.  I have suggested titration percent to 75 mg with blood pressure goal less than 130/80 and ideal blood pressure less than 120/80. 3. Occasional palpitations: Improved with Cardizem CD 240 mg. 4. UARS: I thoroughly reviewed the sleep study with the patient in detail.  She did not have sleep apnea overall but there was mild sleep apnea during REM sleep and evidence for mild snoring. 5. Asthma: Recent increased inflammation now on Dupixent. 6. Multiple sclerosis: Followed by Dr. [name] in [place], currently off her previous medication. 7. Hypothyroidism: Currently on levothyroxine 8. Hyperlipidemia: Continues to be on Vytorin 10/20.  COVID-19 Education: The signs and symptoms of COVID-19 were discussed with the patient and  how to seek care for testing (follow up with PCP or arrange E-visit).  The importance of social  distancing was discussed today.  Time:   Today, I have spent 25 minutes with the patient with telehealth technology discussing the above problems.     Medication Adjustments/Labs and Tests Ordered: Current medicines are reviewed at length with the patient today.  Concerns regarding medicines are outlined above.   Tests Ordered: No orders of the defined types were placed in this encounter.   Medication Changes: No orders of the defined types were placed in this encounter.   Disposition:  Follow up 6 months  Signed, Shelva Majestic, MD  08/01/2018 3:40 PM    [facility]

## 2018-08-02 ENCOUNTER — Other Ambulatory Visit: Payer: Self-pay | Admitting: Internal Medicine

## 2018-08-09 ENCOUNTER — Other Ambulatory Visit: Payer: Self-pay

## 2018-08-09 ENCOUNTER — Ambulatory Visit (INDEPENDENT_AMBULATORY_CARE_PROVIDER_SITE_OTHER): Payer: Medicare Other

## 2018-08-09 DIAGNOSIS — J45909 Unspecified asthma, uncomplicated: Secondary | ICD-10-CM

## 2018-08-09 MED ORDER — DUPILUMAB 300 MG/2ML ~~LOC~~ SOSY
300.0000 mg | PREFILLED_SYRINGE | Freq: Once | SUBCUTANEOUS | Status: AC
  Administered 2018-08-09: 15:00:00 300 mg via SUBCUTANEOUS

## 2018-08-09 NOTE — Progress Notes (Signed)
Have you been hospitalized within the last 10 days?  No Do you have a fever?  No Do you have a cough?  No Do you have a headache or sore throat? No  

## 2018-08-23 ENCOUNTER — Other Ambulatory Visit: Payer: Self-pay

## 2018-08-23 ENCOUNTER — Ambulatory Visit (INDEPENDENT_AMBULATORY_CARE_PROVIDER_SITE_OTHER): Payer: Medicare Other

## 2018-08-23 DIAGNOSIS — J45909 Unspecified asthma, uncomplicated: Secondary | ICD-10-CM

## 2018-08-23 MED ORDER — DUPILUMAB 300 MG/2ML ~~LOC~~ SOSY
300.0000 mg | PREFILLED_SYRINGE | Freq: Once | SUBCUTANEOUS | Status: AC
  Administered 2018-08-23: 14:00:00 300 mg via SUBCUTANEOUS

## 2018-08-23 NOTE — Progress Notes (Signed)
Have you been hospitalized within the last 10 days?  No Do you have a fever?  No Do you have a cough?  No Do you have a headache or sore throat? No  

## 2018-09-06 ENCOUNTER — Ambulatory Visit (INDEPENDENT_AMBULATORY_CARE_PROVIDER_SITE_OTHER): Payer: Medicare Other

## 2018-09-06 ENCOUNTER — Other Ambulatory Visit: Payer: Self-pay

## 2018-09-06 DIAGNOSIS — J45909 Unspecified asthma, uncomplicated: Secondary | ICD-10-CM

## 2018-09-06 MED ORDER — DUPILUMAB 300 MG/2ML ~~LOC~~ SOSY
300.0000 mg | PREFILLED_SYRINGE | Freq: Once | SUBCUTANEOUS | Status: AC
  Administered 2018-09-06: 300 mg via SUBCUTANEOUS

## 2018-09-06 NOTE — Progress Notes (Signed)
Have you been hospitalized within the last 10 days?  No Do you have a fever?  No Do you have a cough?  No Do you have a headache or sore throat? No  

## 2018-09-20 ENCOUNTER — Ambulatory Visit: Payer: Medicare Other

## 2018-09-20 ENCOUNTER — Telehealth: Payer: Self-pay | Admitting: Pulmonary Disease

## 2018-09-20 ENCOUNTER — Other Ambulatory Visit: Payer: Medicare Other

## 2018-09-20 DIAGNOSIS — Z20822 Contact with and (suspected) exposure to covid-19: Secondary | ICD-10-CM

## 2018-09-20 DIAGNOSIS — J4550 Severe persistent asthma, uncomplicated: Secondary | ICD-10-CM

## 2018-09-20 NOTE — Telephone Encounter (Signed)
Difficult to fully read as the initial note that was sent is still incomplete.  To the patient have any actual physical interactions with the person who did test positive?  Based off of the information listed below.  Patient is over the age of 10 but is currently asymptomatic.  I would recommend the patient delay her Dupixent injection by 1 week.  If the patient is in fact having symptoms such as cough, wheezing, shortness of breath then we can decide to test her via the [facility] pool.  Burman Nieves or Lauren can help you with that.  Wyn Quaker, FNP

## 2018-09-20 NOTE — Telephone Encounter (Signed)
I called and spoke to patient. Patient stated that she has been having a scratchy throat and has some shortness of breath.  Patient stated with her asthma she has shortness of breath so it might just be her asthma but she wasn't sure.  Patient stated she was on the porch with her friend that tested positive but not physical contact.  I informed patient that per NP that we would have her get tested for COVID via the [facility] and they would contact her to schedule.   Routing to [facility] per Wyn Quaker, NP.

## 2018-09-20 NOTE — Telephone Encounter (Signed)
Spoke with patient, scheduled patient for COVID 19 test today at [facility] at 3:15 pm.  Testing protocol reviewed.

## 2018-09-20 NOTE — Telephone Encounter (Signed)
I called pt 09/19/2018 to do the COVID screening. She answered no to all the questions.  Pt. Was at an outdoor gathering on 09/09/2018.  One of the ladies that attended went to the Dr [facility] [place].; he or she thought pt (friend) had pna. They tested her (friend) for COVID while she (friend) was there and found out late 09/19/2018 she tested positive. Pt wants to know if she can come in for her shot.  She also would like to know if we would test her. Sorry I got tied up this morning. Pt appt is at 1:30. Please advise as soon as you can.

## 2018-09-20 NOTE — Telephone Encounter (Signed)
Noted Thank you, Burman Nieves!  Addressing B. Mack's note: I finished the note, I was trying to hurry and get the note to him so I could call the pt back with his response.. I forgot to select Sign at exit WS in the bottom left hand corner. I spoke with pt before 12:00 so she wouldn't show up for her appt at 1:30. I'll cancel it. I was going to ask Burman Nieves about COVID testing via [place] after lunch. She's already taken care of it. Nothing further needed, closing note.

## 2018-09-25 ENCOUNTER — Telehealth: Payer: Self-pay | Admitting: Pulmonary Disease

## 2018-09-25 ENCOUNTER — Telehealth: Payer: Self-pay | Admitting: Internal Medicine

## 2018-09-25 ENCOUNTER — Other Ambulatory Visit: Payer: Self-pay

## 2018-09-25 NOTE — Telephone Encounter (Signed)
Please refer to [place] note for updates.

## 2018-09-25 NOTE — Telephone Encounter (Signed)
Noted  

## 2018-09-25 NOTE — Telephone Encounter (Signed)
Patient did go for COVID 19 testing 09/20/2018.  Due to high volume of tests being done currently, results are taking 3-5 business days to be received.

## 2018-09-25 NOTE — Telephone Encounter (Signed)
Spoke with pt and told her my Supervisor asked me to call her to reschedule her Dupixent appt.. (Because her COVID test results haven't come in.) Pt said she's hoping results will come in tomorrow before 3:00. Pt asked how late she could call. Waiting to here from pt if test results came in.

## 2018-09-25 NOTE — Telephone Encounter (Signed)
See other encounter.   Closing

## 2018-09-25 NOTE — Telephone Encounter (Signed)
Attempted to call patient, no answer, left message to call back.  It appears that patient went for testing on 6/25 at 3:15.   Routing to [facility] to make sure test was obtained and to see when expected results are.

## 2018-09-25 NOTE — Telephone Encounter (Signed)
09/25/2018 0847  FYI does not look like the patient ever completed her outpatient PEC testing as instructed.  With patient's symptoms she still needs to complete this.  This will likely delay when she can receive her Dupixent injections at our office since she was recently symptomatic.  Routing to [employer] as FYI.   Burman Nieves and Lauren can someone please arrange for the patient to see why she has not completed this testing.  Also to check on her symptoms.   Wyn Quaker FNP

## 2018-09-25 NOTE — Telephone Encounter (Signed)
Called and spoke to patient. Patient stated that she is "feeling fine now" and reports that she feels it was the air quality contributing to her symptoms.   Let patient know that it may take 5 business days to get results and that she should not come to her next injection until we have COVID results in.  Patient stated she hopes to get the results tomorrow and doesn't want to cancel her 7/2 injection until she is sure the results are in.

## 2018-09-26 LAB — NOVEL CORONAVIRUS, NAA: SARS-CoV-2, NAA: NOT DETECTED

## 2018-09-26 NOTE — Progress Notes (Signed)
SARS-CoV-2 test is negative.  This is good news.  Wyn Quaker, FNP

## 2018-09-27 ENCOUNTER — Ambulatory Visit (INDEPENDENT_AMBULATORY_CARE_PROVIDER_SITE_OTHER): Payer: Medicare Other

## 2018-09-27 ENCOUNTER — Other Ambulatory Visit: Payer: Self-pay

## 2018-09-27 DIAGNOSIS — J45909 Unspecified asthma, uncomplicated: Secondary | ICD-10-CM | POA: Diagnosis not present

## 2018-09-27 MED ORDER — DUPILUMAB 300 MG/2ML ~~LOC~~ SOSY
300.0000 mg | PREFILLED_SYRINGE | Freq: Once | SUBCUTANEOUS | Status: AC
  Administered 2018-09-27: 14:00:00 300 mg via SUBCUTANEOUS

## 2018-09-27 NOTE — Progress Notes (Signed)
Have you been hospitalized within the last 10 days?  No Do you have a fever?  No Do you have a cough?  No Do you have a headache or sore throat? No  

## 2018-09-27 NOTE — Telephone Encounter (Signed)
Pt COVID test was neg per Davy Pique. Nothing further needed.

## 2018-10-11 ENCOUNTER — Ambulatory Visit: Payer: Medicare Other

## 2018-10-15 ENCOUNTER — Other Ambulatory Visit: Payer: Self-pay | Admitting: Internal Medicine

## 2018-10-15 ENCOUNTER — Ambulatory Visit (INDEPENDENT_AMBULATORY_CARE_PROVIDER_SITE_OTHER): Payer: Medicare Other

## 2018-10-15 ENCOUNTER — Other Ambulatory Visit: Payer: Self-pay

## 2018-10-15 DIAGNOSIS — J45909 Unspecified asthma, uncomplicated: Secondary | ICD-10-CM | POA: Diagnosis not present

## 2018-10-15 MED ORDER — DUPILUMAB 300 MG/2ML ~~LOC~~ SOSY
300.0000 mg | PREFILLED_SYRINGE | Freq: Once | SUBCUTANEOUS | Status: AC
  Administered 2018-10-15: 14:00:00 300 mg via SUBCUTANEOUS

## 2018-10-15 NOTE — Progress Notes (Signed)
All questions were answered by the patient before medication was administered. Have you been hospitalized in the last 10 days? No Do you have a fever? No Do you have a cough? No Do you have a headache or sore throat? No  

## 2018-10-29 ENCOUNTER — Ambulatory Visit (INDEPENDENT_AMBULATORY_CARE_PROVIDER_SITE_OTHER): Payer: Medicare Other

## 2018-10-29 ENCOUNTER — Other Ambulatory Visit: Payer: Self-pay

## 2018-10-29 DIAGNOSIS — J45909 Unspecified asthma, uncomplicated: Secondary | ICD-10-CM | POA: Diagnosis not present

## 2018-10-29 MED ORDER — DUPILUMAB 300 MG/2ML ~~LOC~~ SOSY
300.0000 mg | PREFILLED_SYRINGE | Freq: Once | SUBCUTANEOUS | Status: AC
  Administered 2018-10-29: 300 mg via SUBCUTANEOUS

## 2018-10-29 NOTE — Progress Notes (Signed)
Have you been hospitalized within the last 10 days?  No Do you have a fever?  No Do you have a cough?  No Do you have a headache or sore throat? Yes.  Patient reports feeling run down and scratchy throat x1-2 days, "like she is coming down with a cold."  Patient denies any COVID exposure, dyspnea, fatigue, or body aches.  Discussed with Wyn Quaker NP who reported patient may receive Dupixent injection today and follow up with the office if her symptoms worsen.  Patient voices understanding.

## 2018-11-05 ENCOUNTER — Ambulatory Visit (INDEPENDENT_AMBULATORY_CARE_PROVIDER_SITE_OTHER): Payer: Medicare Other | Admitting: Acute Care

## 2018-11-05 ENCOUNTER — Other Ambulatory Visit: Payer: Self-pay

## 2018-11-05 ENCOUNTER — Encounter: Payer: Self-pay | Admitting: Acute Care

## 2018-11-05 VITALS — BP 120/70 | HR 73 | Temp 97.8°F | Ht 62.0 in | Wt 147.2 lb

## 2018-11-05 DIAGNOSIS — J4540 Moderate persistent asthma, uncomplicated: Secondary | ICD-10-CM

## 2018-11-05 DIAGNOSIS — J82 Pulmonary eosinophilia, not elsewhere classified: Secondary | ICD-10-CM

## 2018-11-05 DIAGNOSIS — J8283 Eosinophilic asthma: Secondary | ICD-10-CM

## 2018-11-05 NOTE — Progress Notes (Signed)
History of Present Illness [name] is a 73 y.o. female with persistent eosinophilic asthma. She is followed by [name]   11/05/2018  Pt presents for follow up of her asthma.She has had a stable interval. She was originally on Nucala, which didn't improve her flares.FENO's remained elevated on therapy. She was then switched to Dupixant 300 mg.every 2 weeks in 02/2018.[name] She has been doing very well on this dose and has had no flares since 02/2018.She is seeing a neurologist in Monday 11/12/2018 to determine if she should start on Tysabri for her MS. She has concerns as there is a warning with this drug for brain infections. She would like to know if it would be prudent to try the lower dose of Dupixent to see how she does on the lower dose, considering the additional risks this new drug carries. She had her last injection 10/29/2018, and she is scheduled for her next injection 8/14. She is aware she is doing well now on the higher dose of medication. We discussed that she may flare if we lower the dose. I needed her to be aware of this risk before moving forward.  September 7 she is having a major renovation at her home. She states this will be completed by Christmas. She would like to wait until after renovations to try decreasing the dose as we may not be able to discern if any flares may be 2/2 the construction element of the decrease in dose. .  She is currently well controlled on her Dupixent. She is not using maintenance inhalers. She has had no flares since 02/2018. No night time awakenings, no early morning symptomsShe has not had to use her rescue inhaler x 1 year. She is compliant with her singulair.She denies fever, chest pain, orthopnea or hemoptysis  Test Results: CT chest 01/2016 7 mm nodule resolved, stable other nodules , improved tree in bud nodularity  09/2016 IgE 4, neg RAST  09/2016 Eosinophils 300-400  Feno >.150 10/2016 >187 12/27/16  PFT 11/2016 >nml FEV 1 , no  restriction or obstruction , nml DLCO  CBC Latest Ref Rng & Units 03/25/2018 08/08/2017 03/19/2017  WBC 4.0 - 10.5 K/uL 8.8 5.8 5.2  Hemoglobin 12.0 - 15.0 g/dL 12.7 14.4 13.0  Hematocrit 36.0 - 46.0 % 39.0 43.0 39.0  Platelets 150 - 400 K/uL 193 250.0 193    BMP Latest Ref Rng & Units 03/25/2018 03/19/2017 10/07/2016  Glucose 70 - 99 mg/dL 108(H) 101(H) 128(H)  BUN 8 - 23 mg/dL 17 10 16   Creatinine 0.44 - 1.00 mg/dL 0.72 0.72 0.77  Sodium 135 - 145 mmol/L 136 140 141  Potassium 3.5 - 5.1 mmol/L 3.0(L) 3.8 4.0  Chloride 98 - 111 mmol/L 106 107 108  CO2 22 - 32 mmol/L 21(L) 23 23  Calcium 8.9 - 10.3 mg/dL 9.2 8.9 9.2    BNP No results found for: BNP  ProBNP No results found for: PROBNP  PFT    Component Value Date/Time   FEV1PRE 2.08 11/29/2016 1007   FEV1POST 2.00 11/29/2016 1007   FVCPRE 2.62 11/29/2016 1007   FVCPOST 2.50 11/29/2016 1007   TLC 5.05 11/29/2016 1007   DLCOUNC 20.03 11/29/2016 1007   PREFEV1FVCRT 79 11/29/2016 1007   PSTFEV1FVCRT 80 11/29/2016 1007    No results found.   Past medical hx Past Medical History:  Diagnosis Date  . Anemia    past history-many yrs ago  . Anginal pain ([place])    being evaluated by  Dr. Tyrone Sage, arm pain,"bad indigestion" -no heart related findings as of yet  . Arthritis    hip. back pain  . Complication of anesthesia    s/p Hysterectomy "vagal response "heart stopped" -did not require shocking.  . Coronary artery disease   . Dry eyes   . Esophageal spasm   . GERD (gastroesophageal reflux disease)   . MS (multiple sclerosis) ([place])    stable-sees [name] every 6 months     Social History   Tobacco Use  . Smoking status: Never Smoker  . Smokeless tobacco: Never Used  Substance Use Topics  . Alcohol use: Yes    Alcohol/week: 0.0 standard drinks    Comment: wine occ.  . Drug use: No    Ms.Avera reports that she has never smoked. She has never used smokeless tobacco. She reports current alcohol use. She  reports that she does not use drugs.  Tobacco Cessation: Never smoker  Past surgical hx, Family hx, Social hx all reviewed.  Current Outpatient Medications on File Prior to Visit  Medication Sig  . albuterol (PROVENTIL HFA;VENTOLIN HFA) 108 (90 Base) MCG/ACT inhaler Inhale 1-2 puffs into the lungs every 6 (six) hours as needed for wheezing or shortness of breath.  [name] aspirin EC 81 MG tablet Take 1 tablet (81 mg total) by mouth daily.  . calcium-vitamin D (OSCAL WITH D) 500-200 MG-UNIT tablet Take 1 tablet by mouth daily.  . carbamazepine (TEGRETOL) 200 MG tablet   . cetirizine (ZYRTEC ALLERGY) 10 MG tablet Take 1 tablet (10 mg total) by mouth daily.  . Cholecalciferol (VITAMIN D PO) Take 1 tablet by mouth daily.  [name] CINNAMON PO Take 1 tablet by mouth daily.  . diazepam (VALIUM) 2 MG tablet   . diltiazem (CARDIZEM CD) 240 MG 24 hr capsule Take 1 capsule (240 mg total) by mouth daily.  [name] diltiazem (CARDIZEM CD) 240 MG 24 hr capsule   . DULERA 200-5 MCG/ACT AERO USE 2 PUFFS TWICE DAILY.  . DUPIXENT 300 MG/2ML SOSY   . ipratropium-albuterol (DUONEB) 0.5-2.5 (3) MG/3ML SOLN Take 3 mLs by nebulization 2 (two) times daily.  . isosorbide mononitrate (IMDUR) 60 MG 24 hr tablet TAKE 1 TABLET ONCE DAILY.  [name] levothyroxine (SYNTHROID, LEVOTHROID) 25 MCG tablet Take 25 mcg daily before breakfast by mouth.  . losartan (COZAAR) 50 MG tablet Take 1.5 tablets (75 mg total) by mouth daily.  . magic mouthwash SOLN Take 5 mLs by mouth 3 (three) times daily.  . meclizine (ANTIVERT) 25 MG tablet Take 1 tablet (25 mg total) by mouth 3 (three) times daily as needed for dizziness.  . montelukast (SINGULAIR) 10 MG tablet TAKE ONE TABLET AT BEDTIME.  . Multiple Vitamin (MULTIVITAMIN WITH MINERALS) TABS tablet Take 1 tablet by mouth daily.  [name] NITROSTAT 0.4 MG SL tablet PLACE 1 TAB UNDER THE TONGUE AS NEEDED FOR CHEST PAIN MAY REPEAT EVERY 5 MINUTES.  . pantoprazole (PROTONIX) 20 MG tablet TAKE 1 TABLET BY MOUTH TWICE  DAILY.  [name] PARoxetine (PAXIL) 10 MG tablet Take 5 mg by mouth daily.   [name] Respiratory Therapy Supplies (FLUTTER) DEVI Use as directed  . VYTORIN 10-20 MG per tablet Take 1 tablet by mouth daily.    Current Facility-Administered Medications on File Prior to Visit  Medication  . Mepolizumab SOLR 100 mg  . Mepolizumab SOLR 100 mg  . Mepolizumab SOLR 100 mg     Allergies  Allergen Reactions  . Crestor [Rosuvastatin] Other (See Comments)    Joint pain   .  Iodinated Diagnostic Agents Other (See Comments)    Sneezing and itchy throat; dye was Isovue 300  . Vicodin [Hydrocodone-Acetaminophen]     Made pass-out one time and is okay taking now  . Isovue [Iopamidol]     Pt had sneezing and itching of her throat and soft palate.  Dr Alvester Chou checked pt.  She will need premeds in the future.  J Bohm    Review Of Systems:  Constitutional:   No  weight loss, night sweats,  Fevers, chills, fatigue, or  lassitude.  HEENT:   No headaches,  Difficulty swallowing,  Tooth/dental problems, or  Sore throat,                No sneezing, itching, ear ache, nasal congestion, post nasal drip,   CV:  No chest pain,  Orthopnea, PND, swelling in lower extremities, anasarca, dizziness, palpitations, syncope.   GI  No heartburn, indigestion, abdominal pain, nausea, vomiting, diarrhea, change in bowel habits, loss of appetite, bloody stools.   Resp: No shortness of breath with exertion or at rest.  No excess mucus, no productive cough,  No non-productive cough,  No coughing up of blood.  No change in color of mucus.  No wheezing.  No chest wall deformity  Skin: no rash or lesions.  GU: no dysuria, change in color of urine, no urgency or frequency.  No flank pain, no hematuria   MS:  No joint pain or swelling.  No decreased range of motion.  No back pain.  Psych:  No change in mood or affect. No depression or anxiety.  No memory loss.   Vital Signs BP 120/70 (BP Location: Left Arm, Cuff Size: Large)   Pulse  73   Temp 97.8 F (36.6 C) (Oral)   Ht 5\' 2"  (1.575 m)   Wt 147 lb 3.2 oz (66.8 kg)   SpO2 97%   BMI 26.92 kg/m    Physical Exam:  General- No distress,  A&Ox3, pleasant ENT: No sinus tenderness, TM clear, pale nasal mucosa, no oral exudate,no post nasal drip, no LAN Cardiac: S1, S2, regular rate and rhythm, no murmur Chest: No wheeze/ rales/ dullness; no accessory muscle use, no nasal flaring, no sternal retractions Abd.: Soft Non-tender, ND, BS+, Body mass index is 26.92 kg/m. Ext: No clubbing cyanosis, edema Neuro:  normal strength, MAE x 4, A&O x 3, appropriate Skin: No rashes, no lesions warm and dry Psych: normal mood and behavior   Assessment/Plan  Asthma Stable interval on Dupixent 300 mg every 2 weeks Plan I will talk with [name] about the possibility of decreasing your Dupixent dose after the new year.( After home renovation) Continue Singulair as you have been doing Use nebs and rescue inhaler as needed for a flare. Follow up with [name] in 03/2019. Please contact office for sooner follow up if symptoms do not improve or worsen or seek emergency care Continue COVID precautions as you have been doing.  Call us and let us know if you need [place] to call in a refill for your Ventolin     Magdalen Spatz, NP 11/05/2018  5:23 PM

## 2018-11-05 NOTE — Assessment & Plan Note (Addendum)
Stable interval on Dupixent 300 mg every 2 weeks Plan I will talk with [name] about the possibility of decreasing your Dupixent dose after the new year.( After home renovation) Continue Singulair as you have been doing Use nebs and rescue inhaler as needed for a flare. Follow up with [name] in 03/2019. Please contact office for sooner follow up if symptoms do not improve or worsen or seek emergency care Continue COVID precautions as you have been doing.  Call us and let us know if you need [place] to call in a refill for your Ventolin

## 2018-11-05 NOTE — Patient Instructions (Addendum)
It is good to see you today. I will talk with [name] about the possibility of decreasing your Dupixent dose after the new year. Continue Singulair as you have been doing Use nebs and rescue inhaler as needed for a flare. Follow up with [name] in 03/2019. Please contact office for sooner follow up if symptoms do not improve or worsen or seek emergency care Continue COVID precautions as you have been doing.  Call us and let us know if you need [place] to call in a refill for your Ventolin

## 2018-11-13 ENCOUNTER — Other Ambulatory Visit: Payer: Self-pay | Admitting: Psychiatry

## 2018-11-13 ENCOUNTER — Ambulatory Visit (INDEPENDENT_AMBULATORY_CARE_PROVIDER_SITE_OTHER): Payer: Medicare Other

## 2018-11-13 ENCOUNTER — Other Ambulatory Visit: Payer: Self-pay

## 2018-11-13 DIAGNOSIS — J4540 Moderate persistent asthma, uncomplicated: Secondary | ICD-10-CM | POA: Diagnosis not present

## 2018-11-13 DIAGNOSIS — G35 Multiple sclerosis: Secondary | ICD-10-CM

## 2018-11-13 MED ORDER — DUPILUMAB 300 MG/2ML ~~LOC~~ SOSY
300.0000 mg | PREFILLED_SYRINGE | Freq: Once | SUBCUTANEOUS | Status: AC
  Administered 2018-11-13: 12:00:00 300 mg via SUBCUTANEOUS

## 2018-11-13 MED ORDER — EPINEPHRINE 0.3 MG/0.3ML IJ SOAJ: 0.3000 mg | Freq: Once | INTRAMUSCULAR | 5 refills | Status: AC

## 2018-11-13 NOTE — Progress Notes (Signed)
Have you been hospitalized within the last 10 days?  No Do you have a fever?  No Do you have a cough?  No Do you have a headache or sore throat? No Do you have your Epi Pen visible and is it within date?  Yes 

## 2018-11-19 ENCOUNTER — Telehealth: Payer: Self-pay | Admitting: Internal Medicine

## 2018-11-19 NOTE — Telephone Encounter (Signed)
Dupixent Order: 300mg  #2 prefilled syringe Ordered Date: 11/19/2018 Expected date of arrival: 11/21/2018 Ordered by: Desmond Dike, [place]  [facility]

## 2018-11-21 NOTE — Telephone Encounter (Signed)
Dupixent Shipment Received: 300mg  #2 prefilled syringe Medication arrival date: 11/21/18 Lot #: [id number] Exp date: 03/28/2021 Received by: Tonna Corner

## 2018-11-23 ENCOUNTER — Other Ambulatory Visit: Payer: Self-pay | Admitting: Internal Medicine

## 2018-11-27 ENCOUNTER — Other Ambulatory Visit: Payer: Self-pay

## 2018-11-27 ENCOUNTER — Ambulatory Visit (INDEPENDENT_AMBULATORY_CARE_PROVIDER_SITE_OTHER): Payer: Medicare Other

## 2018-11-27 DIAGNOSIS — J4540 Moderate persistent asthma, uncomplicated: Secondary | ICD-10-CM

## 2018-11-27 DIAGNOSIS — Z23 Encounter for immunization: Secondary | ICD-10-CM

## 2018-11-27 MED ORDER — DUPILUMAB 300 MG/2ML ~~LOC~~ SOSY
300.0000 mg | PREFILLED_SYRINGE | Freq: Once | SUBCUTANEOUS | Status: AC
  Administered 2018-11-27: 300 mg via SUBCUTANEOUS

## 2018-11-27 NOTE — Progress Notes (Signed)
Have you been hospitalized within the last 10 days?  No Do you have a fever?  No Do you have a cough?  No Do you have a headache or sore throat? No Do you have your Epi Pen visible and is it within date?  Yes   Patient requested flu vaccine at visit.  High dose flu vaccine info given. High dose flu vaccine administered.  Nothing further.

## 2018-11-29 ENCOUNTER — Other Ambulatory Visit: Payer: Self-pay | Admitting: Internal Medicine

## 2018-12-11 ENCOUNTER — Other Ambulatory Visit: Payer: Self-pay

## 2018-12-11 ENCOUNTER — Ambulatory Visit (INDEPENDENT_AMBULATORY_CARE_PROVIDER_SITE_OTHER): Payer: Medicare Other

## 2018-12-11 DIAGNOSIS — J4540 Moderate persistent asthma, uncomplicated: Secondary | ICD-10-CM | POA: Diagnosis not present

## 2018-12-11 MED ORDER — DUPILUMAB 300 MG/2ML ~~LOC~~ SOSY
300.0000 mg | PREFILLED_SYRINGE | Freq: Once | SUBCUTANEOUS | Status: AC
  Administered 2018-12-11: 300 mg via SUBCUTANEOUS

## 2018-12-11 NOTE — Progress Notes (Signed)
Have you been hospitalized within the last 10 days?  No Do you have a fever?  No Do you have a cough?  No Do you have a headache or sore throat? No Do you have your Epi Pen visible and is it within date?  Yes 

## 2018-12-12 ENCOUNTER — Ambulatory Visit
Admission: RE | Admit: 2018-12-12 | Discharge: 2018-12-12 | Disposition: A | Discharge status: Home / Self Care | Payer: Medicare Other | Source: Ambulatory Visit | Attending: Psychiatry | Admitting: Psychiatry

## 2018-12-12 DIAGNOSIS — G35 Multiple sclerosis: Secondary | ICD-10-CM

## 2018-12-12 MED ORDER — GADOBENATE DIMEGLUMINE 529 MG/ML IV SOLN
13.0000 mL | Freq: Once | INTRAVENOUS | Status: AC | PRN
  Administered 2018-12-12: 13 mL via INTRAVENOUS

## 2018-12-17 ENCOUNTER — Telehealth: Payer: Self-pay | Admitting: Internal Medicine

## 2018-12-17 NOTE — Telephone Encounter (Signed)
Dupixent Order: 300mg  #2 prefilled syringe Ordered Date: 12/17/2018 Expected date of arrival: 12/20/2018 Ordered by: Desmond Dike, [place]  [facility]

## 2018-12-20 NOTE — Telephone Encounter (Signed)
Dupixent Shipment Received: 300mg  #2 prefilled syringe Medication arrival date: 12/20/18 Lot #: [id number] Exp date: 04/28/2021 Received by: Elliot Dally

## 2018-12-25 ENCOUNTER — Other Ambulatory Visit: Payer: Self-pay

## 2018-12-25 ENCOUNTER — Ambulatory Visit (INDEPENDENT_AMBULATORY_CARE_PROVIDER_SITE_OTHER): Payer: Medicare Other

## 2018-12-25 DIAGNOSIS — J4540 Moderate persistent asthma, uncomplicated: Secondary | ICD-10-CM | POA: Diagnosis not present

## 2018-12-25 MED ORDER — DUPILUMAB 300 MG/2ML ~~LOC~~ SOSY
300.0000 mg | PREFILLED_SYRINGE | Freq: Once | SUBCUTANEOUS | Status: AC
  Administered 2018-12-25: 300 mg via SUBCUTANEOUS

## 2018-12-25 NOTE — Progress Notes (Signed)
All questions were answered by the patient before medication was administered. Have you been hospitalized in the last 10 days? No Do you have a fever? No Do you have a cough? No Do you have a headache or sore throat? No  

## 2019-01-08 ENCOUNTER — Ambulatory Visit (INDEPENDENT_AMBULATORY_CARE_PROVIDER_SITE_OTHER): Payer: Medicare Other

## 2019-01-08 ENCOUNTER — Other Ambulatory Visit: Payer: Self-pay

## 2019-01-08 DIAGNOSIS — J4540 Moderate persistent asthma, uncomplicated: Secondary | ICD-10-CM

## 2019-01-08 MED ORDER — DUPILUMAB 300 MG/2ML ~~LOC~~ SOSY
300.0000 mg | PREFILLED_SYRINGE | Freq: Once | SUBCUTANEOUS | Status: AC
  Administered 2019-01-08: 300 mg via SUBCUTANEOUS

## 2019-01-08 NOTE — Progress Notes (Signed)
Have you been hospitalized within the last 10 days?  No Do you have a fever?  No Do you have a cough?  No Do you have a headache or sore throat? No Do you have your Epi Pen visible and is it within date?  Yes 

## 2019-01-14 ENCOUNTER — Telehealth: Payer: Self-pay | Admitting: Internal Medicine

## 2019-01-14 NOTE — Telephone Encounter (Signed)
[place] to schedule Dupixent shipment.  New Dupixent prescription was needed. Verbal Dupixent prescription given. Was instructed to give 24 hours for insurance to process. Will follow up 01/15/19 to schedule shipment.

## 2019-01-16 NOTE — Telephone Encounter (Signed)
Dupixent Order: 300mg  #2 prefilled syringe Ordered Date: 01/16/2019 Expected date of arrival: 01/18/2019 Ordered by: Desmond Dike, [place]  [facility]ty [facility]

## 2019-01-18 NOTE — Telephone Encounter (Signed)
Dupixent Shipment Received: 300mg  #2 prefilled syringe Medication arrival date: 01/18/2019 Lot #: [id number] Exp date: 06/2021 Received by: Desmond Dike, [place]

## 2019-01-22 ENCOUNTER — Other Ambulatory Visit: Payer: Self-pay

## 2019-01-22 ENCOUNTER — Ambulatory Visit (INDEPENDENT_AMBULATORY_CARE_PROVIDER_SITE_OTHER): Payer: Medicare Other

## 2019-01-22 DIAGNOSIS — J4540 Moderate persistent asthma, uncomplicated: Secondary | ICD-10-CM | POA: Diagnosis not present

## 2019-01-22 MED ORDER — DUPILUMAB 300 MG/2ML ~~LOC~~ SOSY
300.0000 mg | PREFILLED_SYRINGE | Freq: Once | SUBCUTANEOUS | Status: AC
  Administered 2019-01-22: 300 mg via SUBCUTANEOUS

## 2019-01-22 NOTE — Progress Notes (Signed)
All questions were answered by the patient before medication was administered. Have you been hospitalized in the last 10 days? No Do you have a fever? No Do you have a cough? No Do you have a headache or sore throat? No  

## 2019-02-04 ENCOUNTER — Other Ambulatory Visit: Payer: Self-pay | Admitting: Cardiovascular Disease

## 2019-02-05 ENCOUNTER — Other Ambulatory Visit: Payer: Self-pay

## 2019-02-05 ENCOUNTER — Ambulatory Visit (INDEPENDENT_AMBULATORY_CARE_PROVIDER_SITE_OTHER): Payer: Medicare Other

## 2019-02-05 DIAGNOSIS — J4540 Moderate persistent asthma, uncomplicated: Secondary | ICD-10-CM | POA: Diagnosis not present

## 2019-02-05 MED ORDER — DUPILUMAB 300 MG/2ML ~~LOC~~ SOSY
300.0000 mg | PREFILLED_SYRINGE | Freq: Once | SUBCUTANEOUS | Status: AC
  Administered 2019-02-05: 300 mg via SUBCUTANEOUS

## 2019-02-05 NOTE — Progress Notes (Signed)
All questions were answered by the patient before medication was administered. Have you been hospitalized in the last 10 days? No Do you have a fever? No Do you have a cough? No Do you have a headache or sore throat? No  

## 2019-02-11 ENCOUNTER — Telehealth: Payer: Self-pay | Admitting: Internal Medicine

## 2019-02-11 NOTE — Telephone Encounter (Signed)
Dupixent Order: 300mg  #2 prefilled syringe Ordered Date: 02/11/19 Expected date of arrival: 02/14/19 Ordered by: [place]: [facility]

## 2019-02-14 NOTE — Telephone Encounter (Signed)
Dupixent Shipment Received: 300mg  #2 prefilled syringe Medication arrival date: 02/14/19 Lot #: [id number] Exp date: 06/27/2018 Received by: Lattie Haw, LPN

## 2019-02-19 ENCOUNTER — Other Ambulatory Visit: Payer: Self-pay

## 2019-02-19 ENCOUNTER — Ambulatory Visit (INDEPENDENT_AMBULATORY_CARE_PROVIDER_SITE_OTHER): Payer: Medicare Other

## 2019-02-19 DIAGNOSIS — J4540 Moderate persistent asthma, uncomplicated: Secondary | ICD-10-CM

## 2019-02-19 MED ORDER — DUPILUMAB 300 MG/2ML ~~LOC~~ SOSY
300.0000 mg | PREFILLED_SYRINGE | Freq: Once | SUBCUTANEOUS | Status: AC
  Administered 2019-02-19: 300 mg via SUBCUTANEOUS

## 2019-02-19 NOTE — Progress Notes (Signed)
All questions were answered by the patient before medication was administered. Have you been hospitalized in the last 10 days? No Do you have a fever? No Do you have a cough? No Do you have a headache or sore throat? No Len Blalock, [facility] 02/19/19

## 2019-02-23 ENCOUNTER — Other Ambulatory Visit: Payer: Self-pay | Admitting: Internal Medicine

## 2019-03-05 ENCOUNTER — Ambulatory Visit (INDEPENDENT_AMBULATORY_CARE_PROVIDER_SITE_OTHER): Payer: Medicare Other

## 2019-03-05 ENCOUNTER — Other Ambulatory Visit: Payer: Self-pay

## 2019-03-05 DIAGNOSIS — J4540 Moderate persistent asthma, uncomplicated: Secondary | ICD-10-CM

## 2019-03-05 MED ORDER — DUPILUMAB 300 MG/2ML ~~LOC~~ SOSY
300.0000 mg | PREFILLED_SYRINGE | Freq: Once | SUBCUTANEOUS | Status: AC
  Administered 2019-03-05: 10:00:00 300 mg via SUBCUTANEOUS

## 2019-03-05 NOTE — Progress Notes (Signed)
Have you been hospitalized within the last 10 days?  No Do you have a fever?  No Do you have a cough?  No Do you have a headache or sore throat? No Do you have your Epi Pen visible and is it within date?  Yes 

## 2019-03-11 ENCOUNTER — Telehealth: Payer: Self-pay | Admitting: Internal Medicine

## 2019-03-11 NOTE — Telephone Encounter (Signed)
Dupixent Order: 300mg  #2 prefilled syringe Ordered Date: 03/11/2019 Expected date of arrival: 03/13/2019 Ordered by: Desmond Dike, [place]  [facility]ty [facility]: [facility]

## 2019-03-13 NOTE — Telephone Encounter (Signed)
Dupixent Shipment Received: 300mg  #2 prefilled syringe Medication arrival date: 03/13/2019 Lot #: [id number] Exp date: 07/2021 Received by: Desmond Dike, [place]

## 2019-03-19 ENCOUNTER — Encounter: Payer: Self-pay | Admitting: Physician Assistant

## 2019-03-19 ENCOUNTER — Other Ambulatory Visit: Payer: Self-pay

## 2019-03-19 ENCOUNTER — Ambulatory Visit (INDEPENDENT_AMBULATORY_CARE_PROVIDER_SITE_OTHER): Payer: Medicare Other

## 2019-03-19 ENCOUNTER — Ambulatory Visit: Payer: Medicare Other | Admitting: Physician Assistant

## 2019-03-19 VITALS — BP 128/69 | HR 59 | Ht 62.0 in | Wt 150.0 lb

## 2019-03-19 DIAGNOSIS — K224 Dyskinesia of esophagus: Secondary | ICD-10-CM

## 2019-03-19 DIAGNOSIS — E785 Hyperlipidemia, unspecified: Secondary | ICD-10-CM

## 2019-03-19 DIAGNOSIS — G35 Multiple sclerosis: Secondary | ICD-10-CM

## 2019-03-19 DIAGNOSIS — J4540 Moderate persistent asthma, uncomplicated: Secondary | ICD-10-CM

## 2019-03-19 DIAGNOSIS — R072 Precordial pain: Secondary | ICD-10-CM

## 2019-03-19 DIAGNOSIS — J45909 Unspecified asthma, uncomplicated: Secondary | ICD-10-CM | POA: Diagnosis not present

## 2019-03-19 DIAGNOSIS — I2510 Atherosclerotic heart disease of native coronary artery without angina pectoris: Secondary | ICD-10-CM

## 2019-03-19 DIAGNOSIS — I10 Essential (primary) hypertension: Secondary | ICD-10-CM

## 2019-03-19 MED ORDER — DUPILUMAB 300 MG/2ML ~~LOC~~ SOSY
300.0000 mg | PREFILLED_SYRINGE | Freq: Once | SUBCUTANEOUS | Status: AC
  Administered 2019-03-19: 300 mg via SUBCUTANEOUS

## 2019-03-19 NOTE — Progress Notes (Signed)
Cardiology Office Note:    Date:  03/19/2019   ID:  [name], [name] 12-Mar-1946, MRN [id number]  PCP:  [name], MD  Cardiologist:  Shelva Majestic, MD  Electrophysiologist:  None   Referring MD: [name], MD   Chief Complaint  Patient presents with  . Follow-up    seen for Dr. Claiborne Billings.     History of Present Illness:    [name] is a 73 y.o. female with a hx of GERD, HTN, HLD, moderate persistent asthma,multiple sclerosis,esophageal spasm and CAD. She was noted to have mild CAD on cardiac catheterization in July 2008 by Dr. Glade Lloyd. 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 She was placed on medical therapy. Myoview in March 2014 showed nonspecific T wave changes, nondiagnostic 0.5-1 mm inferior lateral ST segment changes with stress. Perfusion image was normal. Last Myoview in December 2016 revealed normal perfusion with the 66%.  Last echocardiogram obtained on 03/08/2017 showed moderate LVH, EF 60 to 65%, grade 1 DD, mild aortic stenosis.  Due to palpitation, 24-hour heart monitor was ordered in 2018 which showed short bursts of SVT, PACs and PVCs.  Symptom improved after amlodipine was switched to Cardizem.  More recently, she underwent sleep study in January 2020 which did not show significant sleep apnea, there was mild sleep apnea during REM sleep with AHI of 7.0/h.  Lowest O2 saturation was 88%.  Patient presents today for cardiology office visit.  She denies any shortness of breath however does admit to have 2 episode of chest pain recently.  First episode occurred several weeks ago and the last episode occurred last week.  This symptom occurred early in the morning and relieved by rest.  Symptoms did not last longer than 10 minutes.  She denies any lower extremity edema, for apnea or PND.  She says she has not been doing much exercise due to the pandemic.  On today's EKG I do  not see any obvious ST-T wave changes.  We discussed various options, I recommended a Lexiscan Myoview to further assess her symptoms.   Past Medical History:  Diagnosis Date  . Anemia    past history-many yrs ago  . Anginal pain ([place])    being evaluated by Dr. Tyrone Sage, arm pain,"bad indigestion" -no heart related findings as of yet  . Arthritis    hip. back pain  . Complication of anesthesia    s/p Hysterectomy "vagal response "heart stopped" -did not require shocking.  . Coronary artery disease   . Dry eyes   . Esophageal spasm   . GERD (gastroesophageal reflux disease)   . MS (multiple sclerosis) ([place])    stable-sees [name] every 6 months    Past Surgical History:  Procedure Laterality Date  . ABDOMINAL HYSTERECTOMY    . BLEPHAROPLASTY Bilateral   . CARDIAC CATHETERIZATION  10/04/06   MINOR CAD,SINGLE VESSEL INVOLVING THE CIRCUMFLEX. 20 TO 30% PROXIMALLY AND 10 TO 20% IN THE MIDDLE SEGMENT.MILD MUSCLE BRIDGING, MID LAD.NORMAL RCA.NORMAL LV FUNCTION.NORMAL MITRAL AND AORTIC VALVE.NORMAL APPEARING AORTA,THORACIC AND ABDOMINAL.NORMAL RENAL ARTERIES.  [name] CARDIOLOGY NUCLEAR MED STUDY  06/22/12   NL LV FUNCTION,EF 68%,NL WALL MOTION.  [name] CAROTID DUPLEX  07/02/11   [id number] SOFT PLAQUE NOTED DISTAL CCA AND ORGIN AND PROXIMAL ICA,LEFT>RIGHT.NO ICA STENOSIS. VERTEBRAL ARTERY FLOW IS ANTEGRADE.  [name] CESAREAN SECTION     x2   . COLONOSCOPY WITH PROPOFOL N/A 12/11/2014   Procedure:  COLONOSCOPY WITH PROPOFOL;  Surgeon: Ronald Lobo, MD;  Location: [facility];  Service: Endoscopy;  Laterality: N/A;  . KNEE ARTHROSCOPY Left    scope  . PARATHYROIDECTOMY     partial-many years ago  . thumb surgery Bilateral    built up and bone removal  . TONSILLECTOMY    . TRANSTHORACIC ECHOCARDIOGRAM  07/02/11   LV CAVITY SIZE IS NORMAL. SYSTOLIC FUNCTION WAS NORMAL.EF=55% TO 60%.INCREASED RELATIVE CONTRIBUTION OF ATRIAL CONTRACTION TO VENTRICULAR FILLING;MAYBE DUE TO HYPOVOLEMIA. AV=MILD REGURG.     Current Medications: Current Meds  Medication Sig  . aspirin EC 81 MG tablet Take 1 tablet (81 mg total) by mouth daily.  . calcium-vitamin D (OSCAL WITH D) 500-200 MG-UNIT tablet Take 1 tablet by mouth daily.  . carbamazepine (TEGRETOL) 200 MG tablet   . cetirizine (ZYRTEC ALLERGY) 10 MG tablet Take 1 tablet (10 mg total) by mouth daily.  . Cholecalciferol (VITAMIN D PO) Take 1 tablet by mouth daily.  . diazepam (VALIUM) 2 MG tablet   . diltiazem (CARDIZEM CD) 240 MG 24 hr capsule TAKE (1) CAPSULE DAILY.  . DULERA 200-5 MCG/ACT AERO USE 2 PUFFS TWICE DAILY.  . DUPIXENT 300 MG/2ML SOSY   . ipratropium-albuterol (DUONEB) 0.5-2.5 (3) MG/3ML SOLN Take 3 mLs by nebulization 2 (two) times daily.  . isosorbide mononitrate (IMDUR) 60 MG 24 hr tablet TAKE 1 TABLET ONCE DAILY.  [name] levothyroxine (SYNTHROID, LEVOTHROID) 25 MCG tablet Take 25 mcg daily before breakfast by mouth.  . losartan (COZAAR) 50 MG tablet Take 1.5 tablets (75 mg total) by mouth daily.  . meclizine (ANTIVERT) 25 MG tablet Take 1 tablet (25 mg total) by mouth 3 (three) times daily as needed for dizziness.  . montelukast (SINGULAIR) 10 MG tablet TAKE ONE TABLET AT BEDTIME.  [name] NITROSTAT 0.4 MG SL tablet PLACE 1 TAB UNDER THE TONGUE AS NEEDED FOR CHEST PAIN MAY REPEAT EVERY 5 MINUTES.  . pantoprazole (PROTONIX) 20 MG tablet TAKE 1 TABLET BY MOUTH TWICE DAILY.  [name] PARoxetine (PAXIL) 10 MG tablet Take 5 mg by mouth daily.   [name] PROAIR HFA 108 (90 Base) MCG/ACT inhaler INHALE 1-2 PUFFS INTO THE LUNGS EVERY SIX HOURS AS NEEDED FOR WHEEZING OR SHORTNESS OF BREATH.  [name] Respiratory Therapy Supplies (FLUTTER) DEVI Use as directed  . VYTORIN 10-20 MG per tablet Take 1 tablet by mouth daily.    Current Facility-Administered Medications for the 03/19/19 encounter (Office Visit) with [name], PA  Medication  . Mepolizumab SOLR 100 mg  . Mepolizumab SOLR 100 mg  . Mepolizumab SOLR 100 mg     Allergies:   Crestor [rosuvastatin], Iodinated  diagnostic agents, Vicodin [hydrocodone-acetaminophen], and Isovue [iopamidol]   Social History   Socioeconomic History  . Marital status: Divorced    Spouse name: Not on file  . Number of children: Not on file  . Years of education: Not on file  . Highest education level: Not on file  Occupational History  . Occupation: retired  Tobacco Use  . Smoking status: Never Smoker  . Smokeless tobacco: Never Used  Substance and Sexual Activity  . Alcohol use: Yes    Alcohol/week: 0.0 standard drinks    Comment: wine occ.  . Drug use: No  . Sexual activity: Not on file  Other Topics Concern  . Not on file  Social History Narrative  . Not on file   Social Determinants of Health   Financial Resource Strain:   . Difficulty of Paying Living Expenses: Not  on file  Food Insecurity:   . Worried About Charity fundraiser in the Last Year: Not on file  . Ran Out of Food in the Last Year: Not on file  Transportation Needs:   . Lack of Transportation (Medical): Not on file  . Lack of Transportation (Non-Medical): Not on file  Physical Activity:   . Days of Exercise per Week: Not on file  . Minutes of Exercise per Session: Not on file  Stress:   . Feeling of Stress : Not on file  Social Connections:   . Frequency of Communication with Friends and Family: Not on file  . Frequency of Social Gatherings with Friends and Family: Not on file  . Attends Religious Services: Not on file  . Active Member of Clubs or Organizations: Not on file  . Attends Archivist Meetings: Not on file  . Marital Status: Not on file     Family History: The patient's family history includes Asthma in her father; Bladder Cancer in her father; Heart disease in her mother; Heart failure in her father; Hyperlipidemia in her brother.  ROS:   Please see the history of present illness.     All other systems reviewed and are negative.  EKGs/Labs/Other Studies Reviewed:    The following studies were  reviewed today:  Echo 03/08/2017 LV EF: 60% -   65% Study Conclusions  - Procedure narrative: Transthoracic echocardiography. Image   quality was adequate. The study was technically difficult. - Left ventricle: The cavity size was normal. Wall thickness was   increased in a pattern of moderate LVH. Systolic function was   normal. The estimated ejection fraction was in the range of 60%   to 65%. Wall motion was normal; there were no regional wall   motion abnormalities. Doppler parameters are consistent with   abnormal left ventricular relaxation (grade 1 diastolic   dysfunction). The E/e&' ratio is between 8-15, suggesting   indeterminate LV filling pressure. - Aortic valve: Mildly calcified leaflets. Mild stenosis. There was   trivial regurgitation. Mean gradient (S): 10 mm Hg. Peak gradient   (S): 20 mm Hg. Valve area (VTI): 1.34 cm^2. Valve area (Vmax):   1.23 cm^2. - Left atrium: The atrium was normal in size. - Inferior vena cava: The vessel was normal in size. The   respirophasic diameter changes were in the normal range (>= 50%),   consistent with normal central venous pressure.  Impressions:  - Technically difficult study. LVEF 60-65%, moderate LVH, normal   wall motion, grade 1 DD, indeterminate LV filling pressure, mild   aortic stenosis with trivial AI, normal LA size, normal IVC.    EKG:  EKG is ordered today.  The ekg ordered today demonstrates normal sinus rhythm without significant ST-T wave changes.  Recent Labs: 03/25/2018: BUN 17; Creatinine, Ser 0.72; Hemoglobin 12.7; Platelets 193; Potassium 3.0; Sodium 136  Recent Lipid Panel No results found for: CHOL, TRIG, HDL, CHOLHDL, VLDL, LDLCALC, LDLDIRECT  Physical Exam:    VS:  BP 128/69   Pulse (!) 59   Ht 5\' 2"  (1.575 m)   Wt 150 lb (68 kg)   SpO2 98%   BMI 27.44 kg/m     Wt Readings from Last 3 Encounters:  03/19/19 150 lb (68 kg)  11/05/18 147 lb 3.2 oz (66.8 kg)  08/01/18 145 lb (65.8 kg)      GEN:  Well nourished, well developed in no acute distress HEENT: Normal NECK: No JVD; No carotid  bruits LYMPHATICS: No lymphadenopathy CARDIAC: RRR, no murmurs, rubs, gallops RESPIRATORY:  Clear to auscultation without rales, wheezing or rhonchi  ABDOMEN: Soft, non-tender, non-distended MUSCULOSKELETAL:  No edema; No deformity  SKIN: Warm and dry NEUROLOGIC:  Alert and oriented x 3 PSYCHIATRIC:  Normal affect   ASSESSMENT:    1. Precordial chest pain   2. Essential hypertension   3. Hyperlipidemia LDL goal <70   4. Asthma, unspecified asthma severity, unspecified whether complicated, unspecified whether persistent   5. Multiple sclerosis ([place])   6. Esophageal spasm   7. Coronary artery disease involving native coronary artery of native heart without angina pectoris    PLAN:    In order of problems listed above:  1. Precordial chest pain: She had 2 episode of chest pain recently and the symptom relieved by resting.  She has not been exerting herself recently therefore we do not know if her symptom is related to physical activity.  EKG is unchanged.  She has a prior history of minimal disease on previous cardiac catheterization in 2008.  Last Myoview in 2016 was normal.  I recommend a repeat Myoview to make sure her recent symptom is not coming from coronary artery disease  2. CAD: Continue aspirin  3. Hyperlipidemia: She is on Vytorin  4. CAD: Previous cardiac catheterization in 2008 revealed minimal disease, LAD myocardial bridging.  5. Esophageal spasm: Seen at [facility]  6. Multiple sclerosis: She is no longer on the infusion therapy  7. Asthma: Followed by [name] of pulmonology, she recently was started on Perkins which has really made a difference with regard to her symptoms.  No recent asthma exacerbation.   Medication Adjustments/Labs and Tests Ordered: Current medicines are reviewed at length with the patient today.  Concerns regarding medicines are outlined  above.  Orders Placed This Encounter  Procedures  . Myocardial Perfusion Imaging  . EKG 12-Lead   No orders of the defined types were placed in this encounter.   Patient Instructions  Medication Instructions:  [name], PA recommends that you continue on your current medications as directed. Please refer to the Current Medication list given to you today.  *If you need a refill on your cardiac medications before your next appointment, please call your pharmacy*  Testing/Procedures: Your physician has ordered a Lexiscan Myocardial Perfusion Imaging Study.  Please arrive 15 minutes prior to your appointment time for registration and insurance purposes.   The test will take approximately 3 to 4 hours to complete; you may bring reading material.  If someone comes with you to your appointment, they will need to remain in the main lobby due to limited space in the testing area. **If you are pregnant or breastfeeding, please notify the nuclear lab prior to your appointment**   How to prepare for your Myocardial Perfusion Test:  Do not eat or drink 3 hours prior to your test, except you may have water.  Do not consume products containing caffeine (regular or decaffeinated) 12 hours prior to your test. (ex: coffee, chocolate, sodas, tea).  Do wear comfortable clothes (no dresses or overalls) and walking shoes, tennis shoes preferred (No heels or open toe shoes are allowed).  Do NOT wear cologne, perfume, aftershave, or lotions (deodorant is allowed).  If you use an inhaler, use it the AM of your test and bring it with you.   If you use a nebulizer, use it the AM of your test.   If these instructions are not followed, your test will have  to be rescheduled.  Follow-Up: At [facility], you and your health needs are our priority.  As part of our continuing mission to provide you with exceptional heart care, we have created designated Provider Care Teams.  These Care Teams include your  primary Cardiologist (physician) and Advanced Practice Providers (APPs -  Physician Assistants and Nurse Practitioners) who all work together to provide you with the care you need, when you need it.  Your next appointment:   6 month(s)  The format for your next appointment:   In Person  Provider:   You may see Shelva Majestic, MD or one of the following Advanced Practice Providers on your designated Care Team:    [name], PA-C  Fabian Sharp, PA-C or   Roby Lofts, PA-C    Signed, [place], [place]  03/19/2019 9:45 AM    [place]

## 2019-03-19 NOTE — Patient Instructions (Signed)
Medication Instructions:  [name], PA recommends that you continue on your current medications as directed. Please refer to the Current Medication list given to you today.  *If you need a refill on your cardiac medications before your next appointment, please call your pharmacy*  Testing/Procedures: Your physician has ordered a Lexiscan Myocardial Perfusion Imaging Study.  Please arrive 15 minutes prior to your appointment time for registration and insurance purposes.   The test will take approximately 3 to 4 hours to complete; you may bring reading material.  If someone comes with you to your appointment, they will need to remain in the main lobby due to limited space in the testing area. **If you are pregnant or breastfeeding, please notify the nuclear lab prior to your appointment**   How to prepare for your Myocardial Perfusion Test:  Do not eat or drink 3 hours prior to your test, except you may have water.  Do not consume products containing caffeine (regular or decaffeinated) 12 hours prior to your test. (ex: coffee, chocolate, sodas, tea).  Do wear comfortable clothes (no dresses or overalls) and walking shoes, tennis shoes preferred (No heels or open toe shoes are allowed).  Do NOT wear cologne, perfume, aftershave, or lotions (deodorant is allowed).  If you use an inhaler, use it the AM of your test and bring it with you.   If you use a nebulizer, use it the AM of your test.   If these instructions are not followed, your test will have to be rescheduled.  Follow-Up: At [facility], you and your health needs are our priority.  As part of our continuing mission to provide you with exceptional heart care, we have created designated Provider Care Teams.  These Care Teams include your primary Cardiologist (physician) and Advanced Practice Providers (APPs -  Physician Assistants and Nurse Practitioners) who all work together to provide you with the care you need, when you need  it.  Your next appointment:   6 month(s)  The format for your next appointment:   In Person  Provider:   You may see Shelva Majestic, MD or one of the following Advanced Practice Providers on your designated Care Team:    [name], PA-C  Fabian Sharp, PA-C or   Roby Lofts, [place]

## 2019-03-19 NOTE — Progress Notes (Signed)
All questions were answered by the patient before medication was administered. Have you been hospitalized in the last 10 days? No Do you have a fever? No Do you have a cough? No Do you have a headache or sore throat? No  

## 2019-03-25 ENCOUNTER — Other Ambulatory Visit: Payer: Self-pay | Admitting: Cardiovascular Disease

## 2019-03-26 ENCOUNTER — Telehealth (HOSPITAL_COMMUNITY): Payer: Self-pay

## 2019-03-26 NOTE — Telephone Encounter (Signed)
Encounter complete. 

## 2019-03-28 ENCOUNTER — Ambulatory Visit (HOSPITAL_COMMUNITY)
Admission: RE | Admit: 2019-03-28 | Discharge: 2019-03-28 | Disposition: A | Discharge status: Home / Self Care | Payer: Medicare Other | Source: Ambulatory Visit | Attending: Cardiovascular Disease | Admitting: Cardiovascular Disease

## 2019-03-28 ENCOUNTER — Other Ambulatory Visit: Payer: Self-pay

## 2019-03-28 DIAGNOSIS — R072 Precordial pain: Secondary | ICD-10-CM

## 2019-03-28 MED ORDER — AMINOPHYLLINE 25 MG/ML IV SOLN
75.0000 mg | Freq: Once | INTRAVENOUS | Status: AC
  Administered 2019-03-28: 75 mg via INTRAVENOUS

## 2019-03-28 MED ORDER — TECHNETIUM TC 99M TETROFOSMIN IV KIT
9.2000 | PACK | Freq: Once | INTRAVENOUS | Status: AC | PRN
  Administered 2019-03-28: 9.2 via INTRAVENOUS
  Filled 2019-03-28: qty 10

## 2019-03-28 MED ORDER — TECHNETIUM TC 99M TETROFOSMIN IV KIT
28.0000 | PACK | Freq: Once | INTRAVENOUS | Status: AC | PRN
  Administered 2019-03-28: 28 via INTRAVENOUS
  Filled 2019-03-28: qty 28

## 2019-03-28 MED ORDER — REGADENOSON 0.4 MG/5ML IV SOLN
0.4000 mg | Freq: Once | INTRAVENOUS | Status: AC
  Administered 2019-03-28: 0.4 mg via INTRAVENOUS

## 2019-04-02 ENCOUNTER — Ambulatory Visit (INDEPENDENT_AMBULATORY_CARE_PROVIDER_SITE_OTHER): Payer: Medicare Other

## 2019-04-02 ENCOUNTER — Other Ambulatory Visit: Payer: Self-pay

## 2019-04-02 ENCOUNTER — Telehealth: Payer: Self-pay | Admitting: Internal Medicine

## 2019-04-02 DIAGNOSIS — J4540 Moderate persistent asthma, uncomplicated: Secondary | ICD-10-CM | POA: Diagnosis not present

## 2019-04-02 LAB — MYOCARDIAL PERFUSION IMAGING
LV dias vol: 73 mL (ref 46–106)
LV sys vol: 25 mL
Peak HR: 115 {beats}/min
Rest HR: 73 {beats}/min
SDS: 5
SRS: 1
SSS: 6
TID: 1.16

## 2019-04-02 MED ORDER — DUPILUMAB 300 MG/2ML ~~LOC~~ SOSY
300.0000 mg | PREFILLED_SYRINGE | Freq: Once | SUBCUTANEOUS | Status: AC
  Administered 2019-04-02: 300 mg via SUBCUTANEOUS

## 2019-04-02 NOTE — Progress Notes (Signed)
All questions were answered by the patient before medication was administered. Have you been hospitalized in the last 10 days? No Do you have a fever? No Do you have a cough? No Do you have a headache or sore throat? No  

## 2019-04-02 NOTE — Telephone Encounter (Signed)
Pt came in today for her Dupixent injection. She states that she has new insurance and she will need a new PA for Dupixent. Attempted do PA on Cover My Meds as [employer] likes [place] to. I received a message stating "authroziation already on file for this request."  Will need to contact [employer] at [phone].

## 2019-04-04 ENCOUNTER — Telehealth: Payer: Self-pay | Admitting: Internal Medicine

## 2019-04-04 NOTE — Telephone Encounter (Signed)
Called [employer] to start pt's PA for Dupixent. Was advised that PA could not be done over the phone. Rep gave me a Key to do PA on Cover My Meds. Key: [id number] Will await PA determination.

## 2019-04-04 NOTE — Telephone Encounter (Signed)
Dupixent Shipment Received: 300mg  #2 prefilled syringe Medication arrival date: 04/04/2019 Lot #: [id number] Exp date: 07/2021 Received by: Desmond Dike, [place]

## 2019-04-04 NOTE — Telephone Encounter (Signed)
Received response from Cover My Meds. PA has been approved through 03/27/2020.

## 2019-04-15 ENCOUNTER — Telehealth: Payer: Self-pay | Admitting: Internal Medicine

## 2019-04-15 NOTE — Telephone Encounter (Signed)
Per Tammy - There isn't any current literature at this time that would sugguest against doing them at the same time. I would recommend separating the 2 injections by one week.  Spoke with pt. She is aware of Tammy recommendation. Pt's Dupixent appointment has been rescheduled to 04/23/19 at 1030. Nothing further was needed.

## 2019-04-15 NOTE — Telephone Encounter (Signed)
Spoke with pt. States that she is scheduled for her Dupixent injection and COVID vaccine tomorrow. Dupixent is scheduled at 1030 and COVID at 1200. Pt is questioning if these need to be administered on separate days.  Tammy - please advise. Thanks.

## 2019-04-16 ENCOUNTER — Ambulatory Visit: Payer: Medicare Other

## 2019-04-16 ENCOUNTER — Ambulatory Visit: Payer: Medicare PPO | Attending: Internal Medicine

## 2019-04-16 DIAGNOSIS — Z23 Encounter for immunization: Secondary | ICD-10-CM | POA: Insufficient documentation

## 2019-04-16 NOTE — Progress Notes (Signed)
   Covid-19 Vaccination Clinic  Name:  [name]    MRN: [id number] DOB: Feb 09, 1946  04/16/2019  [name] was observed post Covid-19 immunization for 15 minutes without incidence. She was provided with Vaccine Information Sheet and instruction to access the V-Safe system.   [name] was instructed to call 911 with any severe reactions post vaccine: [name] Difficulty breathing  . Swelling of your face and throat  . A fast heartbeat  . A bad rash all over your body  . Dizziness and weakness    Immunizations Administered    Name Date Dose VIS Date Route   Pfizer COVID-19 Vaccine 04/16/2019 12:09 PM 0.3 mL 03/08/2019 Intramuscular   Manufacturer: Lily   Lot: [id number]   [place]: [id number]

## 2019-04-22 ENCOUNTER — Other Ambulatory Visit: Payer: Self-pay | Admitting: Internal Medicine

## 2019-04-23 ENCOUNTER — Ambulatory Visit (INDEPENDENT_AMBULATORY_CARE_PROVIDER_SITE_OTHER): Payer: Medicare PPO

## 2019-04-23 ENCOUNTER — Other Ambulatory Visit: Payer: Self-pay

## 2019-04-23 DIAGNOSIS — J4540 Moderate persistent asthma, uncomplicated: Secondary | ICD-10-CM

## 2019-04-23 MED ORDER — DUPILUMAB 300 MG/2ML ~~LOC~~ SOSY
300.0000 mg | PREFILLED_SYRINGE | Freq: Once | SUBCUTANEOUS | Status: AC
  Administered 2019-04-23: 300 mg via SUBCUTANEOUS

## 2019-04-23 NOTE — Progress Notes (Signed)
All questions were answered by the patient before medication was administered. Have you been hospitalized in the last 10 days? No Do you have a fever? No Do you have a cough? No Do you have a headache or sore throat? No  

## 2019-05-01 ENCOUNTER — Other Ambulatory Visit: Payer: Self-pay | Admitting: Internal Medicine

## 2019-05-06 ENCOUNTER — Other Ambulatory Visit: Payer: Self-pay | Admitting: Cardiovascular Disease

## 2019-05-06 ENCOUNTER — Ambulatory Visit: Payer: Medicare PPO | Attending: Internal Medicine

## 2019-05-06 DIAGNOSIS — Z23 Encounter for immunization: Secondary | ICD-10-CM | POA: Insufficient documentation

## 2019-05-06 NOTE — Telephone Encounter (Signed)
*  STAT* If patient is at the pharmacy, call can be transferred to refill team.   1. Which medications need to be refilled? (please list name of each medication and dose if known)  isosorbide mononitrate (IMDUR) 60 MG 24 hr tablet  2. Which pharmacy/location (including street and city if local pharmacy) is medication to be sent to? [place], [place]  3. Do they need a 30 day or 90 day supply? 90 day supply

## 2019-05-06 NOTE — Progress Notes (Signed)
   Covid-19 Vaccination Clinic  Name:  [name]    MRN: [id number] DOB: 01/19/1946  05/06/2019  [name] was observed post Covid-19 immunization for 15 minutes without incidence. She was provided with Vaccine Information Sheet and instruction to access the V-Safe system.   [name] was instructed to call 911 with any severe reactions post vaccine: [name] Difficulty breathing  . Swelling of your face and throat  . A fast heartbeat  . A bad rash all over your body  . Dizziness and weakness    Immunizations Administered    Name Date Dose VIS Date Route   Pfizer COVID-19 Vaccine 05/06/2019 11:26 AM 0.3 mL 03/08/2019 Intramuscular   Manufacturer: Eva   Lot: [id number]   [place]: [id number]

## 2019-05-07 MED ORDER — ISOSORBIDE MONONITRATE ER 60 MG PO TB24: 60.0000 mg | ORAL_TABLET | Freq: Every day | ORAL | 2 refills | Status: DC

## 2019-05-07 NOTE — Telephone Encounter (Signed)
Rx has been sent to the pharmacy electronically. ° °

## 2019-05-10 ENCOUNTER — Telehealth: Payer: Self-pay | Admitting: Internal Medicine

## 2019-05-10 NOTE — Telephone Encounter (Signed)
Dupixent Order: 300mg  #2 prefilled syringe Ordered Date: 05/10/19 Expected date of arrival: 05/14/19 Ordered by: [place] at [place]: [facility]

## 2019-05-13 NOTE — Telephone Encounter (Signed)
Pt needs to reschedule injection for tomorrow to next week 3/23.  Please advise.  [phone]

## 2019-05-13 NOTE — Telephone Encounter (Signed)
Called and spoke with Patient.  Patient requested to change Dupixent injection to 05/21/19. Appointment changed.  Nothing further at this time.

## 2019-05-14 ENCOUNTER — Ambulatory Visit: Payer: Medicare PPO

## 2019-05-15 NOTE — Telephone Encounter (Signed)
Dupixent Shipment Received: 300mg  #2 prefilled syringe Medication arrival date: 05/15/19 Lot #: [id number] Exp date: 09/25/2021 Received by: Elliot Dally

## 2019-05-21 ENCOUNTER — Ambulatory Visit (INDEPENDENT_AMBULATORY_CARE_PROVIDER_SITE_OTHER): Payer: Medicare PPO

## 2019-05-21 ENCOUNTER — Other Ambulatory Visit: Payer: Self-pay

## 2019-05-21 DIAGNOSIS — J4540 Moderate persistent asthma, uncomplicated: Secondary | ICD-10-CM

## 2019-05-21 MED ORDER — DUPILUMAB 300 MG/2ML ~~LOC~~ SOSY
300.0000 mg | PREFILLED_SYRINGE | Freq: Once | SUBCUTANEOUS | Status: AC
  Administered 2019-05-21: 300 mg via SUBCUTANEOUS

## 2019-05-21 NOTE — Progress Notes (Signed)
All questions were answered by the patient before medication was administered. Have you been hospitalized in the last 10 days? No Do you have a fever? No Do you have a cough? No Do you have a headache or sore throat? No  

## 2019-05-24 ENCOUNTER — Telehealth: Payer: Self-pay | Admitting: Internal Medicine

## 2019-05-27 NOTE — Telephone Encounter (Signed)
Dupixent Order: 300mg  #2 prefilled syringe Ordered Date: 05/27/2019 Expected date of arrival: 05/30/2019 Ordered by: Desmond Dike, [place]  Specialty Pharmacy: [facility]

## 2019-05-27 NOTE — Telephone Encounter (Signed)
Contacted [facility] to set up shipment. Was on hold for over 15 minutes with no one coming to the line. Will try back.

## 2019-05-30 NOTE — Telephone Encounter (Signed)
Dupixent Shipment Received: 300mg  #2 prefilled syringe Medication arrival date: 05/30/2019 Lot #: [id number] Exp date: 08/2021 Received by: Desmond Dike, [place]

## 2019-06-04 ENCOUNTER — Ambulatory Visit (INDEPENDENT_AMBULATORY_CARE_PROVIDER_SITE_OTHER): Payer: Medicare PPO

## 2019-06-04 ENCOUNTER — Other Ambulatory Visit: Payer: Self-pay

## 2019-06-04 DIAGNOSIS — J4540 Moderate persistent asthma, uncomplicated: Secondary | ICD-10-CM

## 2019-06-04 MED ORDER — DUPILUMAB 300 MG/2ML ~~LOC~~ SOSY
300.0000 mg | PREFILLED_SYRINGE | Freq: Once | SUBCUTANEOUS | Status: AC
  Administered 2019-06-04: 300 mg via SUBCUTANEOUS

## 2019-06-04 NOTE — Progress Notes (Signed)
All questions were answered by the patient before medication was administered. Have you been hospitalized in the last 10 days? No Do you have a fever? No Do you have a cough? No Do you have a headache or sore throat? No  

## 2019-06-18 ENCOUNTER — Other Ambulatory Visit: Payer: Self-pay

## 2019-06-18 ENCOUNTER — Ambulatory Visit (INDEPENDENT_AMBULATORY_CARE_PROVIDER_SITE_OTHER): Payer: Medicare PPO

## 2019-06-18 DIAGNOSIS — J4540 Moderate persistent asthma, uncomplicated: Secondary | ICD-10-CM

## 2019-06-18 MED ORDER — DUPILUMAB 300 MG/2ML ~~LOC~~ SOSY
300.0000 mg | PREFILLED_SYRINGE | Freq: Once | SUBCUTANEOUS | Status: AC
  Administered 2019-06-18: 300 mg via SUBCUTANEOUS

## 2019-06-18 NOTE — Progress Notes (Signed)
Have you been hospitalized within the last 10 days?  No Do you have a fever?  No Do you have a cough?  No Do you have a headache or sore throat? No  

## 2019-06-19 DIAGNOSIS — R9089 Other abnormal findings on diagnostic imaging of central nervous system: Secondary | ICD-10-CM | POA: Diagnosis not present

## 2019-06-19 DIAGNOSIS — G35 Multiple sclerosis: Secondary | ICD-10-CM | POA: Diagnosis not present

## 2019-06-20 ENCOUNTER — Other Ambulatory Visit: Payer: Self-pay | Admitting: Internal Medicine

## 2019-07-01 ENCOUNTER — Other Ambulatory Visit: Payer: Self-pay | Admitting: Cardiovascular Disease

## 2019-07-02 ENCOUNTER — Ambulatory Visit (INDEPENDENT_AMBULATORY_CARE_PROVIDER_SITE_OTHER): Payer: Medicare PPO

## 2019-07-02 ENCOUNTER — Other Ambulatory Visit: Payer: Self-pay

## 2019-07-02 DIAGNOSIS — J4540 Moderate persistent asthma, uncomplicated: Secondary | ICD-10-CM | POA: Diagnosis not present

## 2019-07-02 MED ORDER — DUPILUMAB 300 MG/2ML ~~LOC~~ SOSY
300.0000 mg | PREFILLED_SYRINGE | Freq: Once | SUBCUTANEOUS | Status: AC
  Administered 2019-07-02: 300 mg via SUBCUTANEOUS

## 2019-07-02 NOTE — Progress Notes (Signed)
All questions were answered by the patient before medication was administered. Have you been hospitalized in the last 10 days? No Do you have a fever? No Do you have a cough? No Do you have a headache or sore throat? No  

## 2019-07-10 ENCOUNTER — Other Ambulatory Visit: Payer: Self-pay

## 2019-07-10 MED ORDER — LOSARTAN POTASSIUM 50 MG PO TABS: ORAL_TABLET | ORAL | 3 refills | Status: DC

## 2019-07-12 ENCOUNTER — Other Ambulatory Visit: Payer: Self-pay

## 2019-07-18 ENCOUNTER — Ambulatory Visit (INDEPENDENT_AMBULATORY_CARE_PROVIDER_SITE_OTHER): Payer: Medicare PPO

## 2019-07-18 ENCOUNTER — Other Ambulatory Visit: Payer: Self-pay

## 2019-07-18 DIAGNOSIS — J4540 Moderate persistent asthma, uncomplicated: Secondary | ICD-10-CM

## 2019-07-18 MED ORDER — DUPILUMAB 300 MG/2ML ~~LOC~~ SOSY
300.0000 mg | PREFILLED_SYRINGE | Freq: Once | SUBCUTANEOUS | Status: AC
  Administered 2019-07-18: 300 mg via SUBCUTANEOUS

## 2019-07-18 NOTE — Progress Notes (Signed)
All questions were answered by the patient before medication was administered. Have you been hospitalized in the last 10 days? No Do you have a fever? No Do you have a cough? No Do you have a headache or sore throat? No  

## 2019-07-31 DIAGNOSIS — R198 Other specified symptoms and signs involving the digestive system and abdomen: Secondary | ICD-10-CM | POA: Diagnosis not present

## 2019-08-01 ENCOUNTER — Ambulatory Visit (INDEPENDENT_AMBULATORY_CARE_PROVIDER_SITE_OTHER): Payer: Medicare PPO

## 2019-08-01 ENCOUNTER — Other Ambulatory Visit: Payer: Self-pay

## 2019-08-01 DIAGNOSIS — J4540 Moderate persistent asthma, uncomplicated: Secondary | ICD-10-CM | POA: Diagnosis not present

## 2019-08-01 MED ORDER — DUPILUMAB 300 MG/2ML ~~LOC~~ SOSY
300.0000 mg | PREFILLED_SYRINGE | Freq: Once | SUBCUTANEOUS | Status: AC
  Administered 2019-08-01: 300 mg via SUBCUTANEOUS

## 2019-08-01 NOTE — Progress Notes (Signed)
All questions were answered by the patient before medication was administered. Have you been hospitalized in the last 10 days? No Do you have a fever? No Do you have a cough? No Do you have a headache or sore throat? No  

## 2019-08-05 DIAGNOSIS — R9089 Other abnormal findings on diagnostic imaging of central nervous system: Secondary | ICD-10-CM | POA: Diagnosis not present

## 2019-08-05 DIAGNOSIS — G35 Multiple sclerosis: Secondary | ICD-10-CM | POA: Diagnosis not present

## 2019-08-12 ENCOUNTER — Other Ambulatory Visit: Payer: Self-pay | Admitting: Cardiovascular Disease

## 2019-08-19 ENCOUNTER — Ambulatory Visit (INDEPENDENT_AMBULATORY_CARE_PROVIDER_SITE_OTHER): Payer: Medicare PPO

## 2019-08-19 DIAGNOSIS — J4540 Moderate persistent asthma, uncomplicated: Secondary | ICD-10-CM

## 2019-08-19 MED ORDER — DUPILUMAB 300 MG/2ML ~~LOC~~ SOSY
300.0000 mg | PREFILLED_SYRINGE | Freq: Once | SUBCUTANEOUS | Status: AC
  Administered 2019-08-19: 300 mg via SUBCUTANEOUS

## 2019-08-19 NOTE — Progress Notes (Signed)
All questions were answered by the patient before medication was administered. Have you been hospitalized in the last 10 days? No Do you have a fever? No Do you have a cough? No Do you have a headache or sore throat? No  

## 2019-08-20 ENCOUNTER — Telehealth: Payer: Self-pay | Admitting: Physician Assistant

## 2019-08-20 DIAGNOSIS — M62838 Other muscle spasm: Secondary | ICD-10-CM | POA: Diagnosis not present

## 2019-08-20 DIAGNOSIS — R9089 Other abnormal findings on diagnostic imaging of central nervous system: Secondary | ICD-10-CM | POA: Diagnosis not present

## 2019-08-20 DIAGNOSIS — D72819 Decreased white blood cell count, unspecified: Secondary | ICD-10-CM | POA: Diagnosis not present

## 2019-08-20 DIAGNOSIS — G35 Multiple sclerosis: Secondary | ICD-10-CM | POA: Diagnosis not present

## 2019-08-20 NOTE — Telephone Encounter (Signed)
Contact patient 08/20/19 to schedule follow up appt, no answer left message

## 2019-08-28 ENCOUNTER — Telehealth: Payer: Self-pay | Admitting: Internal Medicine

## 2019-08-28 DIAGNOSIS — Z1231 Encounter for screening mammogram for malignant neoplasm of breast: Secondary | ICD-10-CM | POA: Diagnosis not present

## 2019-08-28 DIAGNOSIS — Z01419 Encounter for gynecological examination (general) (routine) without abnormal findings: Secondary | ICD-10-CM | POA: Diagnosis not present

## 2019-08-28 DIAGNOSIS — Z6826 Body mass index (BMI) 26.0-26.9, adult: Secondary | ICD-10-CM | POA: Diagnosis not present

## 2019-08-28 NOTE — Telephone Encounter (Signed)
Dupixent Order: 300mg  #2 prefilled syringe Ordered Date: 08/28/2019 Expected date of arrival: 09/03/2019 Ordered by: Desmond Dike, [place]  Specialty Pharmacy: [facility]

## 2019-09-02 ENCOUNTER — Ambulatory Visit: Payer: Medicare PPO

## 2019-09-03 ENCOUNTER — Other Ambulatory Visit: Payer: Self-pay

## 2019-09-03 ENCOUNTER — Ambulatory Visit (INDEPENDENT_AMBULATORY_CARE_PROVIDER_SITE_OTHER): Payer: Medicare PPO

## 2019-09-03 DIAGNOSIS — J4540 Moderate persistent asthma, uncomplicated: Secondary | ICD-10-CM | POA: Diagnosis not present

## 2019-09-03 MED ORDER — DUPILUMAB 300 MG/2ML ~~LOC~~ SOSY
300.0000 mg | PREFILLED_SYRINGE | Freq: Once | SUBCUTANEOUS | Status: AC
  Administered 2019-09-03: 300 mg via SUBCUTANEOUS

## 2019-09-03 NOTE — Progress Notes (Signed)
Have you been hospitalized within the last 10 days?  No Do you have a fever?  No Do you have a cough?  No Do you have a headache or sore throat? No Do you have your Epi Pen visible and is it within date?  Yes 

## 2019-09-03 NOTE — Telephone Encounter (Signed)
Dupixent Shipment Received: 300mg  #2 prefilled syringe Medication arrival date: 09/03/19 Lot #: [id number] Exp date: 12/25/21 Received by: Elliot Dally

## 2019-09-11 ENCOUNTER — Other Ambulatory Visit: Payer: Self-pay

## 2019-09-11 MED ORDER — PANTOPRAZOLE SODIUM 20 MG PO TBEC: 20.0000 mg | DELAYED_RELEASE_TABLET | Freq: Two times a day (BID) | ORAL | 0 refills | Status: DC

## 2019-09-16 DIAGNOSIS — I10 Essential (primary) hypertension: Secondary | ICD-10-CM | POA: Diagnosis not present

## 2019-09-16 DIAGNOSIS — E7800 Pure hypercholesterolemia, unspecified: Secondary | ICD-10-CM | POA: Diagnosis not present

## 2019-09-19 ENCOUNTER — Ambulatory Visit (INDEPENDENT_AMBULATORY_CARE_PROVIDER_SITE_OTHER): Payer: Medicare PPO

## 2019-09-19 ENCOUNTER — Other Ambulatory Visit: Payer: Self-pay

## 2019-09-19 DIAGNOSIS — L817 Pigmented purpuric dermatosis: Secondary | ICD-10-CM | POA: Diagnosis not present

## 2019-09-19 DIAGNOSIS — Z0000 Encounter for general adult medical examination without abnormal findings: Secondary | ICD-10-CM | POA: Diagnosis not present

## 2019-09-19 DIAGNOSIS — E042 Nontoxic multinodular goiter: Secondary | ICD-10-CM | POA: Diagnosis not present

## 2019-09-19 DIAGNOSIS — E7800 Pure hypercholesterolemia, unspecified: Secondary | ICD-10-CM | POA: Diagnosis not present

## 2019-09-19 DIAGNOSIS — J4540 Moderate persistent asthma, uncomplicated: Secondary | ICD-10-CM

## 2019-09-19 DIAGNOSIS — G35 Multiple sclerosis: Secondary | ICD-10-CM | POA: Diagnosis not present

## 2019-09-19 DIAGNOSIS — L209 Atopic dermatitis, unspecified: Secondary | ICD-10-CM | POA: Diagnosis not present

## 2019-09-19 DIAGNOSIS — J45909 Unspecified asthma, uncomplicated: Secondary | ICD-10-CM | POA: Diagnosis not present

## 2019-09-19 DIAGNOSIS — Z7982 Long term (current) use of aspirin: Secondary | ICD-10-CM | POA: Diagnosis not present

## 2019-09-19 DIAGNOSIS — Z86010 Personal history of colonic polyps: Secondary | ICD-10-CM | POA: Diagnosis not present

## 2019-09-19 MED ORDER — DUPILUMAB 300 MG/2ML ~~LOC~~ SOSY
300.0000 mg | PREFILLED_SYRINGE | Freq: Once | SUBCUTANEOUS | Status: AC
  Administered 2019-09-19: 300 mg via SUBCUTANEOUS

## 2019-09-19 NOTE — Progress Notes (Signed)
All questions were answered by the patient before medication was administered. Have you been hospitalized in the last 10 days? No Do you have a fever? No Do you have a cough? No Do you have a headache or sore throat? No  

## 2019-09-23 ENCOUNTER — Telehealth: Payer: Self-pay | Admitting: Internal Medicine

## 2019-09-23 NOTE — Telephone Encounter (Signed)
Dupixent Order: 300mg  #2 prefilled syringe Ordered Date: 09/23/2019 Expected date of arrival: 09/25/2019 Ordered by: Desmond Dike, [place]  Specialty Pharmacy: [facility]

## 2019-09-25 NOTE — Telephone Encounter (Signed)
Dupixent Shipment Received: 300mg  #2 prefilled syringe Medication arrival date: 09/25/2019 Lot #: [id number] Exp date: 12/2021 Received by: Desmond Dike, [place]

## 2019-09-26 ENCOUNTER — Ambulatory Visit: Payer: Medicare PPO | Admitting: Physician Assistant

## 2019-09-26 ENCOUNTER — Other Ambulatory Visit: Payer: Self-pay

## 2019-09-26 ENCOUNTER — Encounter: Payer: Self-pay | Admitting: Physician Assistant

## 2019-09-26 VITALS — BP 120/68 | HR 56 | Ht 62.0 in | Wt 147.0 lb

## 2019-09-26 DIAGNOSIS — I25119 Atherosclerotic heart disease of native coronary artery with unspecified angina pectoris: Secondary | ICD-10-CM | POA: Diagnosis not present

## 2019-09-26 DIAGNOSIS — E785 Hyperlipidemia, unspecified: Secondary | ICD-10-CM | POA: Diagnosis not present

## 2019-09-26 DIAGNOSIS — I10 Essential (primary) hypertension: Secondary | ICD-10-CM

## 2019-09-26 DIAGNOSIS — R0789 Other chest pain: Secondary | ICD-10-CM | POA: Diagnosis not present

## 2019-09-26 MED ORDER — ISOSORBIDE MONONITRATE ER 60 MG PO TB24: 90.0000 mg | ORAL_TABLET | Freq: Every day | ORAL | 3 refills | Status: DC

## 2019-09-26 MED ORDER — NITROGLYCERIN 0.4 MG SL SUBL: SUBLINGUAL_TABLET | SUBLINGUAL | 3 refills | Status: DC

## 2019-09-26 NOTE — Progress Notes (Signed)
Cardiology Office Note:    Date:  09/28/2019   ID:  [name], [name] [birth year], MRN [id number]  PCP:  [name], MD  [facility] Cardiologist:  Shelva Majestic, MD  [place] Electrophysiologist:  None   Referring MD: [name], MD   Chief Complaint  Patient presents with  . Follow-up    seen for Dr. Claiborne Billings.    History of Present Illness:    [name] is a 74 y.o. female with a hx of GERD, HTN, HLD, moderate persistent asthma,multiple sclerosis,esophageal spasm and CAD. She was noted to have mild CAD on cardiac catheterization in July 2008 by Dr. Glade Lloyd. 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 She was placed on medical therapy. Myoview in March 2014 showed nonspecific T wave changes, nondiagnostic 0.5-1 mm inferior lateral ST segment changes with stress. Perfusion image was normal. Myoview in December 2016 revealed normal perfusion with the 66%.  Last echocardiogram obtained on 03/08/2017 showed moderate LVH, EF 60 to 65%, grade 1 DD, mild aortic stenosis.  Due to palpitation, 24-hour heart monitor was ordered in 2018 which showed short bursts of SVT, PACs and PVCs.  Symptom improved after amlodipine was switched to Cardizem.  More recently, she underwent sleep study in January 2020 which did not show significant sleep apnea, there was mild sleep apnea during REM sleep with AHI of 7.0/h.  Lowest O2 saturation was 88%.  Repeat Myoview obtained on 03/28/2019 showed EF 66%, no evidence of prior infarct or ischemia, overall low risk study.  Patient presents today for cardiology office visit.  She is generally doing well.  She continues to have occasional chest tightness radiating to the throat and the right shoulder.  This is very atypical in the sense that it does not occur with activity.  She can continue to do her hour long water aerobic and working the yard without any symptom.   Symptom last 5 to 7 minutes and accompanied with significant malaise.  Her atypical chest discomfort occurs about 2-3 times a month and it has not shown any increasing frequency or duration.  I recommended he increase Imdur to 90 mg to help cover for esophageal spasm.  Otherwise, I recommended continue observation and to follow-up with Dr. Claiborne Billings in 3 months.  She already had her COVID-19 vaccine. Pfizer 1/19 -- 2/8  Past Medical History:  Diagnosis Date  . Anemia    past history-many yrs ago  . Anginal pain ([place])    being evaluated by Dr. Tyrone Sage, arm pain,"bad indigestion" -no heart related findings as of yet  . Arthritis    hip. back pain  . Complication of anesthesia    s/p Hysterectomy "vagal response "heart stopped" -did not require shocking.  . Coronary artery disease   . Dry eyes   . Esophageal spasm   . GERD (gastroesophageal reflux disease)   . MS (multiple sclerosis) ([place])    stable-sees [name] every 6 months    Past Surgical History:  Procedure Laterality Date  . ABDOMINAL HYSTERECTOMY    . BLEPHAROPLASTY Bilateral   . CARDIAC CATHETERIZATION  10/04/06   MINOR CAD,SINGLE VESSEL INVOLVING THE CIRCUMFLEX. 20 TO 30% PROXIMALLY AND 10 TO 20% IN THE MIDDLE SEGMENT.MILD MUSCLE BRIDGING, MID LAD.NORMAL RCA.NORMAL LV FUNCTION.NORMAL MITRAL AND AORTIC VALVE.NORMAL APPEARING AORTA,THORACIC AND ABDOMINAL.NORMAL RENAL ARTERIES.  [name] CARDIOLOGY NUCLEAR MED STUDY  06/22/12   NL LV FUNCTION,EF 68%,NL WALL MOTION.  [name]  CAROTID DUPLEX  07/02/11   LXB:WIOM SOFT PLAQUE NOTED DISTAL CCA AND ORGIN AND PROXIMAL ICA,LEFT>RIGHT.NO ICA STENOSIS. VERTEBRAL ARTERY FLOW IS ANTEGRADE.  [name] CESAREAN SECTION     x2   . COLONOSCOPY WITH PROPOFOL N/A 12/11/2014   Procedure: COLONOSCOPY WITH PROPOFOL;  Surgeon: Ronald Lobo, MD;  Location: [facility];  Service: Endoscopy;  Laterality: N/A;  . KNEE ARTHROSCOPY Left    scope  . PARATHYROIDECTOMY     partial-many years ago  . thumb surgery Bilateral     built up and bone removal  . TONSILLECTOMY    . TRANSTHORACIC ECHOCARDIOGRAM  07/02/11   LV CAVITY SIZE IS NORMAL. SYSTOLIC FUNCTION WAS NORMAL.EF=55% TO 60%.INCREASED RELATIVE CONTRIBUTION OF ATRIAL CONTRACTION TO VENTRICULAR FILLING;MAYBE DUE TO HYPOVOLEMIA. AV=MILD REGURG.    Current Medications: Current Meds  Medication Sig  . amLODipine (NORVASC) 5 MG tablet Take 5 mg by mouth daily.   [name] aspirin EC 81 MG tablet Take 1 tablet (81 mg total) by mouth daily.  . cetirizine (ZYRTEC ALLERGY) 10 MG tablet Take 1 tablet (10 mg total) by mouth daily.  . Cholecalciferol (VITAMIN D PO) Take 1 tablet by mouth daily.  . diazepam (VALIUM) 2 MG tablet Take 2 mg by mouth every 6 (six) hours as needed for anxiety.  [name] diltiazem (CARDIZEM CD) 240 MG 24 hr capsule Take 240 mg by mouth daily. Take 1 capsule by mouth daily  . DULERA 200-5 MCG/ACT AERO USE 2 PUFFS TWICE DAILY.  . DUPIXENT 300 MG/2ML SOSY Inject 300 mg into the skin every 14 (fourteen) days.   [name] gabapentin (NEURONTIN) 300 MG capsule Take 300 mg by mouth daily as needed.   [name] ipratropium-albuterol (DUONEB) 0.5-2.5 (3) MG/3ML SOLN Take 3 mLs by nebulization 2 (two) times daily.  . isosorbide mononitrate (IMDUR) 60 MG 24 hr tablet Take 1.5 tablets (90 mg total) by mouth daily.  [name] levothyroxine (SYNTHROID, LEVOTHROID) 25 MCG tablet Take 25 mcg daily before breakfast by mouth.  . losartan (COZAAR) 50 MG tablet TAKE 1 & 1/2 TABLETS ONCE DAILY.  . meclizine (ANTIVERT) 25 MG tablet Take 1 tablet (25 mg total) by mouth 3 (three) times daily as needed for dizziness.  . montelukast (SINGULAIR) 10 MG tablet TAKE ONE TABLET AT BEDTIME.  . nitroGLYCERIN (NITROSTAT) 0.4 MG SL tablet PLACE 1 TAB UNDER THE TONGUE AS NEEDED FOR CHEST PAIN MAY REPEAT EVERY 5 MINUTES.  . pantoprazole (PROTONIX) 20 MG tablet Take 1 tablet (20 mg total) by mouth 2 (two) times daily.  [name] PARoxetine (PAXIL) 10 MG tablet Take 5 mg by mouth daily.   [name] PROAIR HFA 108 (90 Base) MCG/ACT  inhaler INHALE 1-2 PUFFS INTO THE LUNGS EVERY SIX HOURS AS NEEDED FOR WHEEZING OR SHORTNESS OF BREATH.  . VYTORIN 10-20 MG per tablet Take 1 tablet by mouth daily.   . [DISCONTINUED] isosorbide mononitrate (IMDUR) 60 MG 24 hr tablet Take 1 tablet (60 mg total) by mouth daily.  . [DISCONTINUED] NITROSTAT 0.4 MG SL tablet PLACE 1 TAB UNDER THE TONGUE AS NEEDED FOR CHEST PAIN MAY REPEAT EVERY 5 MINUTES.   Current Facility-Administered Medications for the 09/26/19 encounter (Office Visit) with [name], PA  Medication  . Mepolizumab SOLR 100 mg  . Mepolizumab SOLR 100 mg  . Mepolizumab SOLR 100 mg     Allergies:   Crestor [rosuvastatin], Iodinated diagnostic agents, Vicodin [hydrocodone-acetaminophen], and Isovue [iopamidol]   Social History   Socioeconomic History  . Marital status: Divorced    Spouse name:  Not on file  . Number of children: Not on file  . Years of education: Not on file  . Highest education level: Not on file  Occupational History  . Occupation: retired  Tobacco Use  . Smoking status: Never Smoker  . Smokeless tobacco: Never Used  Vaping Use  . Vaping Use: Never used  Substance and Sexual Activity  . Alcohol use: Yes    Alcohol/week: 0.0 standard drinks    Comment: wine occ.  . Drug use: No  . Sexual activity: Not on file  Other Topics Concern  . Not on file  Social History Narrative  . Not on file   Social Determinants of Health   Financial Resource Strain:   . Difficulty of Paying Living Expenses:   Food Insecurity:   . Worried About Charity fundraiser in the Last Year:   . Arboriculturist in the Last Year:   Transportation Needs:   . Film/video editor (Medical):   [name] Lack of Transportation (Non-Medical):   Physical Activity:   . Days of Exercise per Week:   . Minutes of Exercise per Session:   Stress:   . Feeling of Stress :   Social Connections:   . Frequency of Communication with Friends and Family:   . Frequency of Social Gatherings  with Friends and Family:   . Attends Religious Services:   . Active Member of Clubs or Organizations:   . Attends Archivist Meetings:   [name] Marital Status:      Family History: The patient's family history includes Asthma in her father; Bladder Cancer in her father; Heart disease in her mother; Heart failure in her father; Hyperlipidemia in her brother.  ROS:   Please see the history of present illness.     All other systems reviewed and are negative.  EKGs/Labs/Other Studies Reviewed:    The following studies were reviewed today:  Myoview 03/28/2019 Study Highlights    The left ventricular ejection fraction is hyperdynamic (>65%).  Nuclear stress EF: 66%.  There was no ST segment deviation noted during stress.  The study is normal.  This is a low risk study.   Normal pharmacologic nuclear stress test with no evidence for prior infarct or ischemia. Normal LVEF. This is a low risk study.   EKG:  EKG is ordered today.  The ekg ordered today demonstrates sinus bradycardia without significant ST-T wave changes  Recent Labs: No results found for requested labs within last 8760 hours.  Recent Lipid Panel No results found for: CHOL, TRIG, HDL, CHOLHDL, VLDL, LDLCALC, LDLDIRECT  Physical Exam:    VS:  BP 120/68   Pulse (!) 56   Ht 5\' 2"  (1.575 m)   Wt 147 lb (66.7 kg)   SpO2 96%   BMI 26.89 kg/m     Wt Readings from Last 3 Encounters:  09/26/19 147 lb (66.7 kg)  03/28/19 150 lb (68 kg)  03/19/19 150 lb (68 kg)     GEN:  Well nourished, well developed in no acute distress HEENT: Normal NECK: No JVD; No carotid bruits LYMPHATICS: No lymphadenopathy CARDIAC: RRR, no murmurs, rubs, gallops RESPIRATORY:  Clear to auscultation without rales, wheezing or rhonchi  ABDOMEN: Soft, non-tender, non-distended MUSCULOSKELETAL:  No edema; No deformity  SKIN: Warm and dry NEUROLOGIC:  Alert and oriented x 3 PSYCHIATRIC:  Normal affect   ASSESSMENT:    1.  Atypical chest pain   2. Coronary artery disease involving native coronary artery  of native heart with angina pectoris ([place])   3. Essential hypertension   4. Hyperlipidemia LDL goal <70    PLAN:    In order of problems listed above:  1. Atypical chest pain: Symptom does not correlate with degree of exertion.  She can do hour-long water aerobics without any chest pain.  Last Myoview in December 2020 was negative for significant ischemia.  Continue observation.  Increase Imdur to 90 mg daily to help cover esophageal spasm  2. CAD: Continue aspirin.  No obvious angina, will continue to observe atypical chest pain  3. Hypertension: Blood pressure stable  4. Hyperlipidemia: Continue Vytorin.   Medication Adjustments/Labs and Tests Ordered: Current medicines are reviewed at length with the patient today.  Concerns regarding medicines are outlined above.  Orders Placed This Encounter  Procedures  . EKG 12-Lead   Meds ordered this encounter  Medications  . isosorbide mononitrate (IMDUR) 60 MG 24 hr tablet    Sig: Take 1.5 tablets (90 mg total) by mouth daily.    Dispense:  135 tablet    Refill:  3  . nitroGLYCERIN (NITROSTAT) 0.4 MG SL tablet    Sig: PLACE 1 TAB UNDER THE TONGUE AS NEEDED FOR CHEST PAIN MAY REPEAT EVERY 5 MINUTES.    Dispense:  25 tablet    Refill:  3    Patient Instructions  Medication Instructions:   INCREASE Imdur to 90 mg (1.5 tablets) daily  *If you need a refill on your cardiac medications before your next appointment, please call your pharmacy*  Lab Work: NONE ordered at this time of appointment   If you have labs (blood work) drawn today and your tests are completely normal, you will receive your results only by: [name] MyChart Message (if you have MyChart) OR . A paper copy in the mail If you have any lab test that is abnormal or we need to change your treatment, we will call you to review the results.  Testing/Procedures: NONE ordered at this time of  appointment   Follow-Up: At [facility], you and your health needs are our priority.  As part of our continuing mission to provide you with exceptional heart care, we have created designated Provider Care Teams.  These Care Teams include your primary Cardiologist (physician) and Advanced Practice Providers (APPs -  Physician Assistants and Nurse Practitioners) who all work together to provide you with the care you need, when you need it.   Your next appointment:   3 month(s)  The format for your next appointment:   In Person  Provider:   Shelva Majestic, MD  Other Instructions      Signed, [name], [place]  09/28/2019 10:54 PM    [place]

## 2019-09-26 NOTE — Patient Instructions (Signed)
Medication Instructions:   INCREASE Imdur to 90 mg (1.5 tablets) daily  *If you need a refill on your cardiac medications before your next appointment, please call your pharmacy*  Lab Work: NONE ordered at this time of appointment   If you have labs (blood work) drawn today and your tests are completely normal, you will receive your results only by: [name] MyChart Message (if you have MyChart) OR . A paper copy in the mail If you have any lab test that is abnormal or we need to change your treatment, we will call you to review the results.  Testing/Procedures: NONE ordered at this time of appointment   Follow-Up: At [facility], you and your health needs are our priority.  As part of our continuing mission to provide you with exceptional heart care, we have created designated Provider Care Teams.  These Care Teams include your primary Cardiologist (physician) and Advanced Practice Providers (APPs -  Physician Assistants and Nurse Practitioners) who all work together to provide you with the care you need, when you need it.   Your next appointment:   3 month(s)  The format for your next appointment:   In Person  Provider:   Shelva Majestic, MD  Other Instructions

## 2019-09-28 ENCOUNTER — Encounter: Payer: Self-pay | Admitting: Physician Assistant

## 2019-10-03 ENCOUNTER — Other Ambulatory Visit: Payer: Self-pay

## 2019-10-03 ENCOUNTER — Ambulatory Visit (INDEPENDENT_AMBULATORY_CARE_PROVIDER_SITE_OTHER): Payer: Medicare PPO

## 2019-10-03 DIAGNOSIS — J4540 Moderate persistent asthma, uncomplicated: Secondary | ICD-10-CM

## 2019-10-03 MED ORDER — DUPILUMAB 300 MG/2ML ~~LOC~~ SOSY
300.0000 mg | PREFILLED_SYRINGE | Freq: Once | SUBCUTANEOUS | Status: AC
  Administered 2019-10-03: 300 mg via SUBCUTANEOUS

## 2019-10-03 NOTE — Progress Notes (Signed)
Have you been hospitalized within the last 10 days?  No Do you have a fever?  No Do you have a cough?  No Do you have a headache or sore throat? No Do you have your Epi Pen visible and is it within date?  Yes 

## 2019-10-08 DIAGNOSIS — M542 Cervicalgia: Secondary | ICD-10-CM | POA: Diagnosis not present

## 2019-10-11 DIAGNOSIS — M542 Cervicalgia: Secondary | ICD-10-CM | POA: Diagnosis not present

## 2019-10-14 ENCOUNTER — Other Ambulatory Visit: Payer: Self-pay

## 2019-10-14 ENCOUNTER — Ambulatory Visit: Payer: Medicare PPO | Admitting: Internal Medicine

## 2019-10-14 ENCOUNTER — Encounter: Payer: Self-pay | Admitting: Internal Medicine

## 2019-10-14 VITALS — BP 120/64 | HR 60 | Temp 98.8°F | Ht 62.5 in | Wt 148.2 lb

## 2019-10-14 DIAGNOSIS — J4540 Moderate persistent asthma, uncomplicated: Secondary | ICD-10-CM

## 2019-10-14 DIAGNOSIS — J8283 Eosinophilic asthma: Secondary | ICD-10-CM

## 2019-10-14 NOTE — Patient Instructions (Addendum)
ICD-10-CM   1. Moderate persistent asthma, unspecified whether complicated  Z97.28   2. Eosinophilic asthma  A06.01      asthma is been under excellent control without any inhaler but just on Dupixent therapy  Plan -Stop Zyrtec -Stop montelukast/Singulair -Reduce Dupixent to 2000 mg every 2 weeks [this is a half dose of what you are taking currently which is a lower dose] -Use albuterol as needed -Continue to use mask protocol despite vaccination status given the rise of delta variant  Follow-up -3 months do exhaled nitric oxide testing = Return to see nurse practitioner in 3 months or sooner if needed  - ACT at follow-up

## 2019-10-14 NOTE — Progress Notes (Signed)
OV 11/30/2016  Chief Complaint  Patient presents with  . Follow-up    Pt here after acute visit with TP on 8.31.2018. Pt states she is feeling improved but not back to baseline. Pt states she is still having prod cough with pale yellow mucus, still some SOB. Pt denies CP/tightness, f/c/s.      74 year old female with moderate persistent asthma. Last seen 11/25/2013 by her practitioner. Given prednisone and Z-Pak. Currently she is improved. Pulmonary function test yesterday shows normalized FEV1 and DLCO. Nitric oxide was high 3 weeks ago. She is here with her daughter [name]. Her son has graduated from anesthesia residency at the [employer]. She is worried about repeated it takes of steroids. She admitted to noncompliance with Symbicort and Singulair up until May 2018. She is also worried about her monoclonal antibody infusion for multiple sclerosis which she gets every 6 months for the last year or 2. She says after that she gets extremely fatigued and might require a prednisone burst. Occasionally she is not a respiratory infection. However she's been advised by her neurologist to continue with these injections in order to support her multiple sclerosis. There are no other new issues. She had a 7 mm lung nodule that resolved a year ago  74 year old female never smoker followed for moderate persistent asthma and lung nodule. MS on Infusion -OCREVUS every 6 months  Son is an anesthesiologist   01/13/2017 Follow up : Asthma  Pt returns for follow up for Asthma . She was seen on 12/27/16 for asthma bronchitic flare with 3 week hx of productive cough with thick yellow mucus and wheezing . Her FENO testing was elevated . She was treated with Zpack and Prednisone taper. Says she did get better with less cough, congestion and wheezing . She is concerned because she gets intermittent wheezing especially in am. Goes away after she uses Symbicort.  No fever, discolored mucus, chest pain,  orthopnea,.  She remains on Symbicort, Singulair and Zyrtec. Says she is compliant .  She says she and family are concerned regarding steroids use. We went over steroid use and potential side effects and goal is to limit use as much as possible . Last ov Nucala was ordered and is under approval process . This was added to help with improved asthma control .  She is concerned for adrenal insufficiency , discussed that it can occur with prolonged steroid use. She has been on three 1 week steroid tapers over last 4 months . Suggested she discuss this with PCP to have additional testing or referral to endocrinology if indicated.  FENO testing today is slightly improved at 150 .     Acute 01/27/17 74 year old with history of persistent asthma, multiple sclerosis on  complains of increasing dyspnea for the past 1 week associated with yellow mucus, wheezing, chest tightness.  She denies any nebulizer but has not received them yet.  Paperwork is underway for initiation of nucala but has not been started yet.   She has significant history of GERD with esophageal spasm, atypical chest pain.  She follows at [facility] for this.  She is taking Protonix alternating with Zyrtec.     OV 02/08/2017  Chief Complaint  Patient presents with  . Follow-up    Pt last seen by PM on 11.2.2018 for an acute visit. Pt states she started to imporve but now is feeling like she is catching a cold again. Pt c/o prod cough with yellow mucus. Pt deniee  SOB, CP/tihgtness and f/c/s.      74 year old female with eosinophilic and allergic asthma.  Presents for follow-up.  Most recently seen January 27, 2017 by my colleague for asthma exacerbation.  She is on her last day of prednisone today.  She feels improved.  Her nitric oxide has been the lowest it has been in 2018 which is 115 ppb but still significantly elevated.  She feels she is catching a cold although she feels overall better.  She is on Symbicort schedule and Singulair  scheduled.  She is not on any inhaled anticholinergic.  She is awaiting her interleukin-5 receptor antibody treatment.  She is worried about taking this because of cross interference with the other monoclonal antibody that she takes for her multiple sclerosis.  We had a discussion on this.  I did tell her that the cross-reactive and side effect profile is unknown and the general unpredictability can be higher.  Despite this we thought that the overall risk benefit ratio is in favor of her taking her interleukin-5 receptor antibody for asthma.   TEST  CT chest 01/2016 7 mm nodule resolved, stable other nodules , improved tree in bud nodularity  09/2016 IgE 4, neg RAST  09/2016 Eosinophils 300-400  Feno >.150 10/2016 >187 12/27/16  PFT 11/2016 >nml  FEV 1 , no restriction or obstruction , nml DLCO   OV 04/19/2017  Chief Complaint  Patient presents with  . Follow-up    has had 2 ED visit in December 2018 with asthma/bronchitis,no admission,feels like breathing at this time is better,she can tell improvement   Follow-up allergic asthma with eosinophilic asthma but normal IgE  She is here with her daughter [name] who runs a daycare for after school kids at [facility].  Her son is no longer at the [employer] but is now working in [place].  She tells me currently her asthma is stable.  She and her daughter are very categorical that the whole asthma started after she started taking monoclonal antibody infusions for her multiple sclerosis.   there is an infusion administered over 6 hours every 6 months.  She first started this infusion around November 2017.  She tells me that she has infusion reactions that start within a day or so of receiving the infusion.  These infusion reactions are characterized by body ache and fever and aggravation of past medical issues such as the meniscus.  She also has throat irritation and bronchospasm.  Then some 2 weeks later like clockwork she gets acute bronchitis which  is then treated as an asthma exacerbation.  She also has significant fatigue.  She says it takes her months to recover from this and she has a normal good 5 months before the cycle is repeated again.  Most recently the infusion was in November 2018 and then she ended up in the ER for asthma exacerbation in December 2018.  She was supposed to start interleukin-5 receptor antibody for asthma that was poorly controlled and because of persistent eosinophilia and high nitric oxide but given the cycle she and her daughter have decided against Nucala especially because she is convinced it is the [place] for multiple sclerosis that is causing this problem.  She is going to skip her next multiple sclerosis infusion in May 2019.  At this point in time she is compliant with her Symbicort.  Med review shows coreg   OV 05/10/2017  Chief Complaint  Patient presents with  . Acute Visit    Pt  has complaints of coughing with thick yellow mucus and occ. dark brown mucus along with wheezing and hoarseness. Pt denies any fever.   76 -year-old female with asthma eosinophilic and moderate persistent on Symbicort.  She presents for an acute visit.  Since seeing me 3 weeks ago she had gradual deterioration in symptoms.  She is having significant nocturnal symptoms.  Her friends are telling her that her voice is hoarse.  She says she is compliant with her Symbicort and Singulair.  Her interleukin-5 receptor antibody is on hold because she is worried about interactions with the monoclonal antibody for multiple sclerosis still in her system and the washout of that is expected only June 2019.  Currently her asthma control questionnaire shows a score of 3.6 showing significant symptoms.  She is waking up a few times at night because of asthma symptoms and when she wakes up she has moderate symptoms.  She is moderately limited in her activities because of asthma.  She is short of breath quite a bit and wheezing all the time although  she hold off on using her albuterol for rescue.  Med review shows that she is on nonspecific beta blocker carvedilol.  There is no fever or chills.    OV 06/08/2017  Chief Complaint  Patient presents with  . Follow-up    Pt has had problems with side effects from the spiriva due to eye problems and dry mouth.  Breo and spiriva had helped with the wheezing but due to side effects, meds were stopped x2 days. States she had a coughing fit with the meds as well. Pt has c/o cough with yellow mucus, SOB, and is back to wheezing.   74 year old female with asthma eosinophilic and moderate persistent.  At last visit she was in acute visit.  I added Spiriva to her regimen.  Switch to Symbicort 2 higher dose Brio.  I asked her to continue her Singulair.  She still was not interested in her biologic therapy given her issues doing biologic therapy with multiple sclerosis.  We also had her talk to her physician and get her nonspecific beta-blocker carvedilol switched out.  She did all this.  However she tells me that Spiriva is causing dry mouth and she stopped it like a week ago.  For some unclear reason she also stopped the Brio for a few days a week ago and then again for a few days earlier this week and then starting yesterday or like 2 days ago started having worsening asthma symptoms.  She is now reporting significant cough.  When she wakes up she has mild symptoms when she is active she is very slightly limited because of her asthma.  She is short of breath a moderate amount because of the asthma and is wheezing a moderate amount because of the asthma and in the last 2 days most of the time and she is now using nebulizer for rescue 2 times daily.  Average asthma control question as 2.2.  Her exact nitric oxide test continues to be elevated at 156 as always.  She is now accepting that she will have to go through biologic therapy.  Her daughter status with her.  The questioning alternative etiologies.  She tells me  that she might have mold in the house.  Review of the labs show she had normal allergy blood test in July 2018 and a clear chest x-ray in December 2018 [CT chest in November showed tree-in-bud in the right upper lobe 1 month  earlier] IgE was normal back in July 2018.  Aspergillus panel never checked.   07/06/2017  Pt. Was seen for an acute flare 06/08/2017 after stopping her Breo. At that visit, Dr. Golden Pop plan was as follows: Plan Retest for ABPA             - check IgE, aspergillus antibody blood test , do CXR Take prednisone 40 mg daily x 2 days, then 20mg  daily x 2 days, then 10mg  daily x 2 days, then 5mg  daily x 2 days and stop Start dulera high dose - you need to be on inhaled steroids No spiriva for you Continue singulair Glad coreg got changed out Start Nucala starting next week FENO at this visit was 156 ppb  Followup 4-6 weeks or sooner if needed with myself or APP             - feno and ACQ at follow up  Pt. Presents today . She states she completed he prednisone taper . She has been compliant with her Dulera, Singulair and Zyrtec. She has not had to use her rescue inhaler. She is not coughing. She states maybe once daily. Her cough is productive for yellow secretions on the occasion she does cough. She has had 1 Nucala injection.She did have some fatigue 1 week after the injection. It lasted x 1 day. She did have some mild arm tenderness one week after the injection. She does feel she is better than she was last month.She denies fever, chest pain, orthopnea or hemoptysis.    OV 08/08/2017  Chief Complaint  Patient presents with  . Follow-up    Pt states she is coughing up a lot of yellow mucus in the mornings. Other than the cough, pt states she has been doing good.    Erina Hamme presents for follow-up.  She has severe eosinophilic asthma that is poorly controlled with persistently significant high elevated nitric oxide levels  I personally saw her in mid March  2019.  At that time I recommended starting nucala.  She is now had 2 doses 4 weeks apart of that same injection.  Today is the third dose.  She says for the last few weeks she is noticing increased sputum production and loosening of the sputum and a yellow color in the sputum.  She feels that it is because the injection is working.  She does not feel it is the asthma exacerbation.  Her exam nitric oxide continues to be very high at 162 today.  She says she is compliant with her Dulera.  In terms of her multiple sclerosis she is coming off Ocrivis and going to sart on TYSABRI.  the care for this is being coordinated by Dr. Gorden Harms.  And apparently the neurologist was wondering about she says that between the last visit in this current visit she got her immunoglobulin checked by him and all these levels were low the correlation of that and asthma.  With in our labs we do notice that the IgE levels are low.  Currently she is not running any fever or chills or orthopnea or active wheezing   OV 10/16/2017  Chief Complaint  Patient presents with  . Follow-up    Pt states she has been doing well since last OV with Wyn Quaker, NP but states she is still coughing up phlegm.    Jasenia Weilbacher presents for follow-up.  She has severe persistent asthma with eosinophila asthma that is poorly controlled with persistently significant high elevated nitric  oxide levels usually > 150. On Nucala since mid-march 2019   Since I saw her last 2 months ago she's had an exacerbation requiring prednisone. She continues on interleukin-5 receptor antibody subcutaneous injections every 4 weeks since mid March 2019. Currently she feels better than before asthma control questionnaire is1.2 and is on the better score she's had over time.However nitric oxide test is significantly elevated and worse and now in the 200s. She is very puzzled by this. She denies anyone infestation. Most recent eosinophil count to show improvement. But she  still overall very symptomatic and still requiring prednisone. We discussed the switch to another biologic dupulimab and she is open to this idea    OV 11/30/2017  Subjective:  Patient ID: [name], female , DOB: April 02, 1945 , age 5 y.o. , MRN: [id number] , ADDRESS: [place] [facility] [id number]   11/30/2017 -   Chief Complaint  Patient presents with  . Follow-up    Pt states she has been doing good since last visit except states she has had a headache x2 days and has had a cough. Pt also has c/o chest tightness.     HPI [name] 74 y.o. -follow-up severe persistent asthma with poorly controlled symptoms. She is on Dulera, Singulair and biologic Nucala. She reports that currently it is one of her better days and weeks. Despite that asthma control questionnaire shows significant symptom with the 5 point score of 1.8. She says she is not waking up in the middle of the night because of asthma when she wakes up she is very mild symptoms when she is very slightly limited in her activities and she is moderately short of breath and wheezing a lot of the time but not using much albuterol for rescue. This is despite compliance. Last visit we switched her to [place] but this is stuck with insurance pre-authorization required and that is some delays with this. In addition she also tells me that at every visit she's been giving sputum samples at our lab basementand she is unclear why no one called her with results. I have told her that I was unaware that this was going on. Review of the charts indicate sputum samples given October 2018 in April 2019. Along and consistent with a respiratory in asthma symptoms at that early morning she has cough with yellow sputum which is consistent with high airway eosinophil load as evidenced by significantly high exhaled nitric oxide is despite biologic therapy.  [name]  ROS - per HPI       OV 03/01/2018  Subjective:  Patient ID: TWYLIA OKA,  female , DOB: [birth year] , age 81 y.o. , MRN: [id number] , ADDRESS: [place] [facility] [id number]   03/01/2018 -   Chief Complaint  Patient presents with  . Follow-up    f/u asthma, no wheezing since dupixent     HPI [name] 74 y.o. -  Follow-up severe eosinophilic asthma on Dupixent.  She is taking the shots in her arm every 2 weeks.  This is been going on for 8 weeks or so.  She reports excellent improvement in her asthma.  Asthma control questionnaire 0 out of 5.  Significant improvement in her exam nitric oxide today going from the mid 100s to 43.  She continues to be on Dulera and Singulair.  She is asking if she can de-escalate any of this therapy.  She is not waking up in the middle of the night with  asthma.  She not having any symptoms when she wakes up.  Not limited in her activities.  When she wakes up she not short of breath.  No wheezing.  She not using albuterol for rescue.  She thinks she might be getting a cold but she is not sure.  Nitric oxide and symptom profile shows huge improvements.  However she is wondering if she is having side effects from the dupilumab.  She is reporting some blurred vision and needing to use reading glasses more so after starting the dupilumab.  This is temporally correlated.  Review of the literature shows conjunctivitis and pruritus reported at low percentages but not blurred vision.  OV 05/31/2018  Subjective:  Patient ID: [name], female , DOB: 1945/04/13 , age 21 y.o. , MRN: [id number] , ADDRESS: [place] [facility] [id number]   05/31/2018 -   Chief Complaint  Patient presents with  . Follow-up    Pt states she has been doing okay since last visit and denies any current complaints of cough, SOB, or CP     HPI KYMARI LOLLIS 74 y.o. -presents for follow-up.  She has severe asthma with eosinophilia.  Finally she is on Dupixent/in December 2019 she had asthma exacerbation and ended up in the ER and needed prednisone  but after that she is been doing well.  Asthma control question is 0.  She not waking up in the middle of the night with symptoms when she wakes up she is asymptomatic.  No limitations in activities no albuterol rescue use.  No wheezing no shortness of breath no cough.  Asthma control question is 0.  Exam nitric oxide is further improved to 22.  However she wants to stop the Dupixent because she feels she is getting a brain fog although she states that it could be related to [place].  She also wants to start her MS medications and is worried about the effects of polypharmacy.  She also discussed because of the upcoming coronavirus 19 pandemic.           OV 10/14/2019  Subjective:  Patient ID: [name], female , DOB: Jan 02, 1946 , age 33 y.o. , MRN: [id number] , ADDRESS: [place] [facility] [id number]   10/14/2019 -   Chief Complaint  Patient presents with  . Follow-up   Eosinophilic moderate to severe persistent asthma  HPI [name] 74 y.o. -presents for follow-up.  I last saw her March 2020.  Then with the pandemic I did not see her.  She has been coming to the office and getting her Dupixent twice weekly.  She is on the higher dose.  She is not using any inhaled steroid.  She wants to know if she can reduce the dose of the Dupixent.  She is also on Zyrtec and Singulair.  She wants to know if she can stop this.  Asthma is completely under control and she is asymptomatic.        Ref. Range 10/07/2016 12:29 05/10/2017  06/08/2017 Start nucala 08/08/2017  10/16/2017   11/30/2017  03/01/2018 On dupixemen 05/31/2018   Nitric Oxide Unknown 180 153 156 162 237 158 43 22  ACQ score   3.6   2.2  1.2 1.8 0 0     Ref. Range 05/10/2009 04:32 10/07/2016 13:11 10/07/2016 20:51 03/19/2017 15:45 08/08/2017 11:32  Eosinophils Absolute Latest Ref Range: 0.0 - 0.7 K/uL 0.1 0.4 0.3 0.3 0.0    Ref. Range 10/07/2016 13:11  IgEcoming (Immunoglobulin E), Serum Latest Ref Range: <115 kU/L 4     ROS - per HPI  Results for DELORIA, BRASSFIELD (MRN [id number]) as of 10/14/2019 14:20  Ref. Range 10/07/2016 13:11 10/07/2016 20:51 03/19/2017 15:45 08/08/2017 11:32 03/25/2018 17:01  Eosinophils Absolute Latest Ref Range: 0 - 0 K/uL 0.4 0.3 0.3 0.0 0.0     has a past medical history of Anemia, Anginal pain ([place]), Arthritis, Complication of anesthesia, Coronary artery disease, Dry eyes, Esophageal spasm, GERD (gastroesophageal reflux disease), and MS (multiple sclerosis) ([place]).   reports that she has never smoked. She has never used smokeless tobacco.  Past Surgical History:  Procedure Laterality Date  . ABDOMINAL HYSTERECTOMY    . BLEPHAROPLASTY Bilateral   . CARDIAC CATHETERIZATION  10/04/06   MINOR CAD,SINGLE VESSEL INVOLVING THE CIRCUMFLEX. 20 TO 30% PROXIMALLY AND 10 TO 20% IN THE MIDDLE SEGMENT.MILD MUSCLE BRIDGING, MID LAD.NORMAL RCA.NORMAL LV FUNCTION.NORMAL MITRAL AND AORTIC VALVE.NORMAL APPEARING AORTA,THORACIC AND ABDOMINAL.NORMAL RENAL ARTERIES.  [name] CARDIOLOGY NUCLEAR MED STUDY  06/22/12   NL LV FUNCTION,EF 68%,NL WALL MOTION.  [name] CAROTID DUPLEX  07/02/11   TKZ:SWFU SOFT PLAQUE NOTED DISTAL CCA AND ORGIN AND PROXIMAL ICA,LEFT>RIGHT.NO ICA STENOSIS. VERTEBRAL ARTERY FLOW IS ANTEGRADE.  [name] CESAREAN SECTION     x2   . COLONOSCOPY WITH PROPOFOL N/A 12/11/2014   Procedure: COLONOSCOPY WITH PROPOFOL;  Surgeon: Ronald Lobo, MD;  Location: [facility];  Service: Endoscopy;  Laterality: N/A;  . KNEE ARTHROSCOPY Left    scope  . PARATHYROIDECTOMY     partial-many years ago  . thumb surgery Bilateral    built up and bone removal  . TONSILLECTOMY    . TRANSTHORACIC ECHOCARDIOGRAM  07/02/11   LV CAVITY SIZE IS NORMAL. SYSTOLIC FUNCTION WAS NORMAL.EF=55% TO 60%.INCREASED RELATIVE CONTRIBUTION OF ATRIAL CONTRACTION TO VENTRICULAR FILLING;MAYBE DUE TO HYPOVOLEMIA. AV=MILD REGURG.    Allergies  Allergen Reactions  . Crestor [Rosuvastatin] Other (See Comments)    Joint pain   . Iodinated  Diagnostic Agents Other (See Comments)    Sneezing and itchy throat; dye was Isovue 300  . Vicodin [Hydrocodone-Acetaminophen]     Made pass-out one time and is okay taking now  . Isovue [Iopamidol]     Pt had sneezing and itching of her throat and soft palate.  Dr Alvester Chou checked pt.  She will need premeds in the future.  J Bohm    Immunization History  Administered Date(s) Administered  . Fluad Quad(high Dose 65+) 11/27/2018  . Influenza, High Dose Seasonal PF 12/22/2013, 12/14/2015, 12/14/2016, 11/30/2017  . Influenza,inj,Quad PF,6+ Mos 11/27/2014  . PFIZER SARS-COV-2 Vaccination 04/16/2019, 05/06/2019  . Pneumococcal Polysaccharide-23 10/26/2013  . Pneumococcal-Unspecified 10/26/2013  . Tdap 06/14/2011  . Zoster Recombinat (Shingrix) 11/13/2017, 02/08/2018    Family History  Problem Relation Age of Onset  . Heart disease Mother   . Asthma Father   . Bladder Cancer Father   . Heart failure Father   . Hyperlipidemia Brother      Current Outpatient Medications:  .  amLODipine (NORVASC) 5 MG tablet, Take 5 mg by mouth daily. , Disp: , Rfl:  .  aspirin EC 81 MG tablet, Take 1 tablet (81 mg total) by mouth daily., Disp: 90 tablet, Rfl: 3 .  cetirizine (ZYRTEC ALLERGY) 10 MG tablet, Take 1 tablet (10 mg total) by mouth daily., Disp: 30 tablet, Rfl: 5 .  Cholecalciferol (VITAMIN D PO), Take 1 tablet by mouth daily., Disp: , Rfl:  .  diazepam (VALIUM) 2 MG tablet,  Take 2 mg by mouth every 6 (six) hours as needed for anxiety., Disp: , Rfl:  .  diltiazem (CARDIZEM CD) 240 MG 24 hr capsule, Take 240 mg by mouth daily. Take 1 capsule by mouth daily, Disp: , Rfl:  .  DULERA 200-5 MCG/ACT AERO, USE 2 PUFFS TWICE DAILY., Disp: 13 g, Rfl: 3 .  DUPIXENT 300 MG/2ML SOSY, Inject 300 mg into the skin every 14 (fourteen) days. , Disp: , Rfl:  .  gabapentin (NEURONTIN) 300 MG capsule, Take 300 mg by mouth daily as needed. , Disp: , Rfl:  .  ipratropium-albuterol (DUONEB) 0.5-2.5 (3) MG/3ML SOLN,  Take 3 mLs by nebulization 2 (two) times daily., Disp: 360 mL, Rfl: 0 .  isosorbide mononitrate (IMDUR) 60 MG 24 hr tablet, Take 1.5 tablets (90 mg total) by mouth daily., Disp: 135 tablet, Rfl: 3 .  levothyroxine (SYNTHROID, LEVOTHROID) 25 MCG tablet, Take 25 mcg daily before breakfast by mouth., Disp: , Rfl:  .  losartan (COZAAR) 50 MG tablet, TAKE 1 & 1/2 TABLETS ONCE DAILY., Disp: 135 tablet, Rfl: 3 .  meclizine (ANTIVERT) 25 MG tablet, Take 1 tablet (25 mg total) by mouth 3 (three) times daily as needed for dizziness., Disp: 30 tablet, Rfl: 0 .  montelukast (SINGULAIR) 10 MG tablet, TAKE ONE TABLET AT BEDTIME., Disp: 30 tablet, Rfl: 5 .  nitroGLYCERIN (NITROSTAT) 0.4 MG SL tablet, PLACE 1 TAB UNDER THE TONGUE AS NEEDED FOR CHEST PAIN MAY REPEAT EVERY 5 MINUTES., Disp: 25 tablet, Rfl: 3 .  pantoprazole (PROTONIX) 20 MG tablet, Take 1 tablet (20 mg total) by mouth 2 (two) times daily., Disp: 60 tablet, Rfl: 0 .  PARoxetine (PAXIL) 10 MG tablet, Take 5 mg by mouth daily. , Disp: , Rfl:  .  PROAIR HFA 108 (90 Base) MCG/ACT inhaler, INHALE 1-2 PUFFS INTO THE LUNGS EVERY SIX HOURS AS NEEDED FOR WHEEZING OR SHORTNESS OF BREATH., Disp: 8.5 g, Rfl: 2 .  Respiratory Therapy Supplies (FLUTTER) DEVI, Use as directed, Disp: 1 each, Rfl: 0 .  VYTORIN 10-20 MG per tablet, Take 1 tablet by mouth daily. , Disp: , Rfl:   Current Facility-Administered Medications:  [name]  Mepolizumab SOLR 100 mg, 100 mg, Subcutaneous, Q28 days, Brand Males, MD, 100 mg at 06/14/17 0917 .  Mepolizumab SOLR 100 mg, 100 mg, Subcutaneous, Q28 days, Brand Males, MD, 100 mg at 07/12/17 0902 .  Mepolizumab SOLR 100 mg, 100 mg, Subcutaneous, Q28 days, Brand Males, MD, 100 mg at 10/31/17 2633      Objective:   Vitals:   10/14/19 1403  BP: 120/64  Pulse: 60  Temp: 98.8 F (37.1 C)  TempSrc: Oral  SpO2: 96%  Weight: 148 lb 3.2 oz (67.2 kg)  Height: 5' 2.5" (1.588 m)    Estimated body mass index is 26.67 kg/m  as calculated from the following:   Height as of this encounter: 5' 2.5" (1.588 m).   Weight as of this encounter: 148 lb 3.2 oz (67.2 kg).  @WEIGHTCHANGE @  [employer]   10/14/19 1403  Weight: 148 lb 3.2 oz (67.2 kg)     Physical Exam Alert and oriented x3.  Nonfocal exam.  Clear to auscultation bilaterally no oral thrush.  Abdomen soft normal heart sounds.  No stigmata of connective tissue disease no elevated neck nodes no JVP.         Assessment:       ICD-10-CM   1. Moderate persistent asthma, unspecified whether complicated  H54.56   2.  Eosinophilic asthma  P03.40        Plan:     Patient Instructions     ICD-10-CM   1. Moderate persistent asthma, unspecified whether complicated  B52.48   2. Eosinophilic asthma  L85.90      asthma is been under excellent control without any inhaler but just on Dupixent therapy  Plan -Stop Zyrtec -Stop montelukast/Singulair -Reduce Dupixent to 2000 mg every 2 weeks [this is a half dose of what you are taking currently which is a lower dose] -Use albuterol as needed -Continue to use mask protocol despite vaccination status given the rise of delta variant  Follow-up -3 months do exhaled nitric oxide testing = Return to see nurse practitioner in 3 months or sooner if needed  - ACT at follow-up     SIGNATURE    Dr. Brand Males, M.D., F.C.C.P,  Pulmonary and Critical Care Medicine Staff Physician, [place] Director - Interstitial Lung Disease  Program  [facility] [place] [facility] [place], [place], [address]  Pager: [phone], If no answer or between  15:00h - 7:00h: call [phone] Telephone: [phone]  2:35 PM 10/14/2019

## 2019-10-15 DIAGNOSIS — M542 Cervicalgia: Secondary | ICD-10-CM | POA: Diagnosis not present

## 2019-10-16 ENCOUNTER — Telehealth: Payer: Self-pay | Admitting: Pharmacy Technician

## 2019-10-16 DIAGNOSIS — I2510 Atherosclerotic heart disease of native coronary artery without angina pectoris: Secondary | ICD-10-CM | POA: Diagnosis not present

## 2019-10-16 DIAGNOSIS — I493 Ventricular premature depolarization: Secondary | ICD-10-CM | POA: Diagnosis not present

## 2019-10-16 DIAGNOSIS — E042 Nontoxic multinodular goiter: Secondary | ICD-10-CM | POA: Diagnosis not present

## 2019-10-16 DIAGNOSIS — E039 Hypothyroidism, unspecified: Secondary | ICD-10-CM | POA: Diagnosis not present

## 2019-10-16 DIAGNOSIS — J4540 Moderate persistent asthma, uncomplicated: Secondary | ICD-10-CM

## 2019-10-16 MED ORDER — DUPIXENT 200 MG/1.14ML ~~LOC~~ SOSY: 200.0000 mg | PREFILLED_SYRINGE | SUBCUTANEOUS | 2 refills | Status: DC

## 2019-10-16 NOTE — Telephone Encounter (Signed)
Received paperwork for [place] 200mg . Will update as we work through the benefits process.

## 2019-10-16 NOTE — Telephone Encounter (Signed)
Prescription for Dupixent 200 mg syringe every 14 days to [employer] [facility].  Please schedule patient for next injection upon receipt of medication.   Mariella Saa, PharmD, [place], CPP Clinical Specialty Pharmacist (Rheumatology and Pulmonology)  10/16/2019 3:08 PM

## 2019-10-16 NOTE — Telephone Encounter (Signed)
Attempted PA for Dupixent 200mg  dose, received message that current authorization on file through 03/27/20 covers dose change. Please send in new dose to pharmacy.

## 2019-10-17 DIAGNOSIS — M542 Cervicalgia: Secondary | ICD-10-CM | POA: Diagnosis not present

## 2019-10-21 ENCOUNTER — Other Ambulatory Visit: Payer: Self-pay

## 2019-10-21 ENCOUNTER — Ambulatory Visit (INDEPENDENT_AMBULATORY_CARE_PROVIDER_SITE_OTHER): Payer: Medicare PPO

## 2019-10-21 DIAGNOSIS — J4540 Moderate persistent asthma, uncomplicated: Secondary | ICD-10-CM | POA: Diagnosis not present

## 2019-10-21 MED ORDER — DUPILUMAB 300 MG/2ML ~~LOC~~ SOSY
300.0000 mg | PREFILLED_SYRINGE | Freq: Once | SUBCUTANEOUS | Status: AC
  Administered 2019-10-21: 300 mg via SUBCUTANEOUS

## 2019-10-21 NOTE — Progress Notes (Signed)
Have you been hospitalized within the last 10 days?  No Do you have a fever?  No Do you have a cough?  No Do you have a headache or sore throat? No Do you have your Epi Pen visible and is it within date?  Yes 

## 2019-10-22 DIAGNOSIS — M542 Cervicalgia: Secondary | ICD-10-CM | POA: Diagnosis not present

## 2019-10-23 NOTE — Telephone Encounter (Signed)
Dupixent Order: 200mg  #2 prefilled syringe Ordered Date: 10/23/2019 Expected date of arrival: 10/29/2019 Ordered by: Desmond Dike, [place]  Specialty Pharmacy: [facility]

## 2019-10-25 DIAGNOSIS — M542 Cervicalgia: Secondary | ICD-10-CM | POA: Diagnosis not present

## 2019-10-29 NOTE — Telephone Encounter (Signed)
Dupixent Shipment Received: 200mg  #2 prefilled syringe Medication arrival date: 10/29/2019 Lot #: [id number] Exp date: 11/2020 Received by: Desmond Dike, [place]

## 2019-10-31 DIAGNOSIS — M810 Age-related osteoporosis without current pathological fracture: Secondary | ICD-10-CM | POA: Diagnosis not present

## 2019-10-31 DIAGNOSIS — M8589 Other specified disorders of bone density and structure, multiple sites: Secondary | ICD-10-CM | POA: Diagnosis not present

## 2019-10-31 DIAGNOSIS — E559 Vitamin D deficiency, unspecified: Secondary | ICD-10-CM | POA: Diagnosis not present

## 2019-11-04 ENCOUNTER — Other Ambulatory Visit: Payer: Self-pay

## 2019-11-04 ENCOUNTER — Ambulatory Visit (INDEPENDENT_AMBULATORY_CARE_PROVIDER_SITE_OTHER): Payer: Medicare PPO

## 2019-11-04 DIAGNOSIS — J4540 Moderate persistent asthma, uncomplicated: Secondary | ICD-10-CM | POA: Diagnosis not present

## 2019-11-04 MED ORDER — DUPILUMAB 200 MG/1.14ML ~~LOC~~ SOSY
200.0000 mg | PREFILLED_SYRINGE | Freq: Once | SUBCUTANEOUS | Status: AC
  Administered 2019-11-04: 200 mg via SUBCUTANEOUS

## 2019-11-04 MED ORDER — DUPILUMAB 200 MG/1.14ML ~~LOC~~ SOSY: 200.0000 mg | PREFILLED_SYRINGE | Freq: Once | SUBCUTANEOUS | Status: DC

## 2019-11-04 NOTE — Progress Notes (Signed)
All questions were answered by the patient before medication was administered. Have you been hospitalized in the last 10 days? No Do you have a fever? No Do you have a cough? No Do you have a headache or sore  throat? No   Pt refused the loading dose of Dupixent 200mg . She only wanted one injection today.

## 2019-11-21 ENCOUNTER — Ambulatory Visit (INDEPENDENT_AMBULATORY_CARE_PROVIDER_SITE_OTHER): Payer: Medicare PPO

## 2019-11-21 ENCOUNTER — Other Ambulatory Visit: Payer: Self-pay

## 2019-11-21 DIAGNOSIS — J4540 Moderate persistent asthma, uncomplicated: Secondary | ICD-10-CM | POA: Diagnosis not present

## 2019-11-21 MED ORDER — DUPILUMAB 200 MG/1.14ML ~~LOC~~ SOSY
200.0000 mg | PREFILLED_SYRINGE | Freq: Once | SUBCUTANEOUS | Status: AC
  Administered 2019-11-21: 200 mg via SUBCUTANEOUS

## 2019-11-21 NOTE — Progress Notes (Signed)
Have you been hospitalized within the last 10 days?  No Do you have a fever?  No Do you have a cough?  No Do you have a headache or sore throat? No Do you have your Epi Pen visible and is it within date?  Yes 

## 2019-11-25 ENCOUNTER — Telehealth: Payer: Self-pay | Admitting: Internal Medicine

## 2019-11-25 NOTE — Telephone Encounter (Signed)
Dupixent Order: 200mg  #2 prefilled syringe Ordered Date: 11/25/19 Expected date of arrival: 11/27/2019 Ordered by: [place]: Claria Dice

## 2019-11-27 DIAGNOSIS — E039 Hypothyroidism, unspecified: Secondary | ICD-10-CM | POA: Diagnosis not present

## 2019-11-27 DIAGNOSIS — R198 Other specified symptoms and signs involving the digestive system and abdomen: Secondary | ICD-10-CM | POA: Diagnosis not present

## 2019-11-27 DIAGNOSIS — Z8719 Personal history of other diseases of the digestive system: Secondary | ICD-10-CM | POA: Diagnosis not present

## 2019-11-27 DIAGNOSIS — Z86010 Personal history of colonic polyps: Secondary | ICD-10-CM | POA: Diagnosis not present

## 2019-11-27 NOTE — Telephone Encounter (Signed)
Dupixent Shipment Received: 200mg  #2 prefilled syringe Medication arrival date: 11/27/19 Lot #: [id number] Exp date: 12/25/2020 Received by: Elliot Dally

## 2019-11-28 DIAGNOSIS — M810 Age-related osteoporosis without current pathological fracture: Secondary | ICD-10-CM | POA: Diagnosis not present

## 2019-12-03 DIAGNOSIS — Z6827 Body mass index (BMI) 27.0-27.9, adult: Secondary | ICD-10-CM | POA: Diagnosis not present

## 2019-12-03 DIAGNOSIS — E042 Nontoxic multinodular goiter: Secondary | ICD-10-CM | POA: Diagnosis not present

## 2019-12-03 DIAGNOSIS — E892 Postprocedural hypoparathyroidism: Secondary | ICD-10-CM | POA: Diagnosis not present

## 2019-12-03 DIAGNOSIS — I2510 Atherosclerotic heart disease of native coronary artery without angina pectoris: Secondary | ICD-10-CM | POA: Diagnosis not present

## 2019-12-03 DIAGNOSIS — E039 Hypothyroidism, unspecified: Secondary | ICD-10-CM | POA: Diagnosis not present

## 2019-12-03 DIAGNOSIS — I493 Ventricular premature depolarization: Secondary | ICD-10-CM | POA: Diagnosis not present

## 2019-12-05 ENCOUNTER — Other Ambulatory Visit: Payer: Self-pay

## 2019-12-05 ENCOUNTER — Ambulatory Visit (INDEPENDENT_AMBULATORY_CARE_PROVIDER_SITE_OTHER): Payer: Medicare PPO

## 2019-12-05 DIAGNOSIS — J4540 Moderate persistent asthma, uncomplicated: Secondary | ICD-10-CM

## 2019-12-05 MED ORDER — DUPILUMAB 200 MG/1.14ML ~~LOC~~ SOSY
200.0000 mg | PREFILLED_SYRINGE | Freq: Once | SUBCUTANEOUS | Status: AC
  Administered 2019-12-05: 200 mg via SUBCUTANEOUS

## 2019-12-05 NOTE — Progress Notes (Signed)
Have you been hospitalized within the last 10 days?  No Do you have a fever?  No Do you have a cough?  No Do you have a headache or sore throat? No Do you have your Epi Pen visible and is it within date?  Yes 

## 2019-12-09 ENCOUNTER — Other Ambulatory Visit: Payer: Self-pay | Admitting: Internal Medicine

## 2019-12-18 DIAGNOSIS — Z1159 Encounter for screening for other viral diseases: Secondary | ICD-10-CM | POA: Diagnosis not present

## 2019-12-19 ENCOUNTER — Other Ambulatory Visit: Payer: Self-pay

## 2019-12-19 ENCOUNTER — Ambulatory Visit (INDEPENDENT_AMBULATORY_CARE_PROVIDER_SITE_OTHER): Payer: Medicare PPO

## 2019-12-19 DIAGNOSIS — Z23 Encounter for immunization: Secondary | ICD-10-CM

## 2019-12-19 DIAGNOSIS — J4540 Moderate persistent asthma, uncomplicated: Secondary | ICD-10-CM | POA: Diagnosis not present

## 2019-12-19 MED ORDER — DUPILUMAB 200 MG/1.14ML ~~LOC~~ SOSY
200.0000 mg | PREFILLED_SYRINGE | Freq: Once | SUBCUTANEOUS | Status: AC
Start: 2019-12-19 — End: 2019-12-19
  Administered 2019-12-19: 200 mg via SUBCUTANEOUS

## 2019-12-19 NOTE — Progress Notes (Signed)
Have you been hospitalized within the last 10 days?  No Do you have a fever?  No Do you have a cough?  No Do you have a headache or sore throat? No Do you have your Epi Pen visible and is it within date?  Yes 

## 2019-12-23 ENCOUNTER — Telehealth: Payer: Self-pay | Admitting: Internal Medicine

## 2019-12-23 ENCOUNTER — Other Ambulatory Visit: Payer: Self-pay | Admitting: *Deleted

## 2019-12-23 DIAGNOSIS — Z86010 Personal history of colonic polyps: Secondary | ICD-10-CM | POA: Diagnosis not present

## 2019-12-23 DIAGNOSIS — D122 Benign neoplasm of ascending colon: Secondary | ICD-10-CM | POA: Diagnosis not present

## 2019-12-23 DIAGNOSIS — K5730 Diverticulosis of large intestine without perforation or abscess without bleeding: Secondary | ICD-10-CM | POA: Diagnosis not present

## 2019-12-23 DIAGNOSIS — J4540 Moderate persistent asthma, uncomplicated: Secondary | ICD-10-CM

## 2019-12-23 MED ORDER — DUPIXENT 200 MG/1.14ML ~~LOC~~ SOSY: 200.0000 mg | PREFILLED_SYRINGE | SUBCUTANEOUS | 5 refills | Status: DC

## 2019-12-23 NOTE — Telephone Encounter (Signed)
Dupixent Order: 200mg  #2 prefilled syringe Ordered Date: 12/23/19 Expected date of arrival: 12/26/19 Ordered by: [place]: [facility]

## 2019-12-25 DIAGNOSIS — L309 Dermatitis, unspecified: Secondary | ICD-10-CM | POA: Diagnosis not present

## 2019-12-25 DIAGNOSIS — L814 Other melanin hyperpigmentation: Secondary | ICD-10-CM | POA: Diagnosis not present

## 2019-12-25 DIAGNOSIS — D2239 Melanocytic nevi of other parts of face: Secondary | ICD-10-CM | POA: Diagnosis not present

## 2019-12-25 DIAGNOSIS — D122 Benign neoplasm of ascending colon: Secondary | ICD-10-CM | POA: Diagnosis not present

## 2019-12-26 NOTE — Telephone Encounter (Signed)
Dupixent Shipment Received: 300mg  #2 prefilled syringe Medication arrival date: 12/26/2019 Lot #: [id number] Exp date: 11/25/2021 Received by: Elliot Dally

## 2020-01-02 ENCOUNTER — Ambulatory Visit (INDEPENDENT_AMBULATORY_CARE_PROVIDER_SITE_OTHER): Payer: Medicare PPO

## 2020-01-02 ENCOUNTER — Other Ambulatory Visit: Payer: Self-pay

## 2020-01-02 DIAGNOSIS — J4540 Moderate persistent asthma, uncomplicated: Secondary | ICD-10-CM | POA: Diagnosis not present

## 2020-01-02 MED ORDER — DUPILUMAB 200 MG/1.14ML ~~LOC~~ SOSY
200.0000 mg | PREFILLED_SYRINGE | Freq: Once | SUBCUTANEOUS | Status: AC
  Administered 2020-01-02: 200 mg via SUBCUTANEOUS

## 2020-01-02 NOTE — Progress Notes (Signed)
Have you been hospitalized within the last 10 days?  No Do you have a fever?  No Do you have a cough?  No Do you have a headache or sore throat? No Do you have your Epi Pen visible and is it within date?  Yes 

## 2020-01-14 ENCOUNTER — Ambulatory Visit: Payer: Medicare PPO | Admitting: Primary Care

## 2020-01-14 ENCOUNTER — Other Ambulatory Visit: Payer: Self-pay

## 2020-01-14 ENCOUNTER — Encounter: Payer: Self-pay | Admitting: Primary Care

## 2020-01-14 VITALS — BP 124/68 | HR 65 | Temp 97.3°F | Ht 62.5 in | Wt 150.0 lb

## 2020-01-14 DIAGNOSIS — J4540 Moderate persistent asthma, uncomplicated: Secondary | ICD-10-CM

## 2020-01-14 LAB — CBC WITH DIFFERENTIAL/PLATELET
Basophils Absolute: 0 10*3/uL (ref 0.0–0.1)
Basophils Relative: 0.7 % (ref 0.0–3.0)
Eosinophils Absolute: 0.1 10*3/uL (ref 0.0–0.7)
Eosinophils Relative: 2.3 % (ref 0.0–5.0)
HCT: 39.5 % (ref 36.0–46.0)
Hemoglobin: 13.3 g/dL (ref 12.0–15.0)
Lymphocytes Relative: 37.8 % (ref 12.0–46.0)
Lymphs Abs: 2.3 10*3/uL (ref 0.7–4.0)
MCHC: 33.5 g/dL (ref 30.0–36.0)
MCV: 92.2 fl (ref 78.0–100.0)
Monocytes Absolute: 0.6 10*3/uL (ref 0.1–1.0)
Monocytes Relative: 10.3 % (ref 3.0–12.0)
Neutro Abs: 3 10*3/uL (ref 1.4–7.7)
Neutrophils Relative %: 48.9 % (ref 43.0–77.0)
Platelets: 213 10*3/uL (ref 150.0–400.0)
RBC: 4.29 Mil/uL (ref 3.87–5.11)
RDW: 13.5 % (ref 11.5–15.5)
WBC: 6.1 10*3/uL (ref 4.0–10.5)

## 2020-01-14 LAB — NITRIC OXIDE: Nitric Oxide: 32

## 2020-01-14 MED ORDER — ALBUTEROL SULFATE HFA 108 (90 BASE) MCG/ACT IN AERS
INHALATION_SPRAY | RESPIRATORY_TRACT | 2 refills | Status: DC
Start: 2020-01-14 — End: 2022-01-28

## 2020-01-14 NOTE — Progress Notes (Signed)
@Patient  ID: [name], female    DOB: 09-Dec-1945, 74 y.o.   MRN: [id number]  Chief Complaint  Patient presents with  . Follow-up    Pt states she has been doing well since last visit and denies any complaints.    Referring provider: [name], MD  HPI: 74 year old female, never smoked. PMH significant for moderate persistent eosinophilic asthma, multiple sclerosis. Patient of [name], last seen on 10/14/19. Maintained on Dupixent 200mg  q 14 days and prn albuterol.   Previous [facility] pulmonary encounter: [name] 74 y.o. -presents for follow-up.  I last saw her March 2020.  Then with the pandemic I did not see her.  She has been coming to the office and getting her Dupixent twice weekly.  She is on the higher dose.  She is not using any inhaled steroid.  She wants to know if she can reduce the dose of the Dupixent.  She is also on Zyrtec and Singulair.  She wants to know if she can stop this.  Asthma is completely under control and she is asymptomatic.  01/14/2020- Interim hx Patient presents today for 3 month follow-up. During last visit Zyrtec and Singulair were stopped. Dupixent was reduced to 300mg  every 2 weeks. She has not needed to use her Albuterol rescue inhaler in over 1 year. She feels very well, states that its the best she has felt in some time. She does have an occasional dry cough once every 2 weeks and mild nocturnal wheezing, these symptoms hardly bother her. She is not on maintenance inhalers. She has chest tightness and shortness of breath which she attributed to her MS. She is considering restarting Ocrevus. Denies fevers, chills, sweats, chest pain, significant shortness of breath, nasal congestion.     Ref. Range 10/07/2016 12:29 05/10/2017  06/08/2017 Start nucala 08/08/2017  10/16/2017   11/30/2017  03/01/2018 On dupixemen 05/31/2018  01/14/2020 On Dupixent, 200mg  Q14days  Nitric Oxide Unknown 180 153 156 162 237 158 43 22  32  ACQ score   3.6   2.2   1.2 1.8 0 0 24   Allergies  Allergen Reactions  . Crestor [Rosuvastatin] Other (See Comments)    Joint pain   . Iodinated Diagnostic Agents Other (See Comments)    Sneezing and itchy throat; dye was Isovue 300  . Vicodin [Hydrocodone-Acetaminophen]     Made pass-out one time and is okay taking now  . Isovue [Iopamidol]     Pt had sneezing and itching of her throat and soft palate.  Dr Alvester Chou checked pt.  She will need premeds in the future.  J Bohm    Immunization History  Administered Date(s) Administered  . Fluad Quad(high Dose 65+) 11/27/2018, 12/19/2019  . Influenza, High Dose Seasonal PF 12/22/2013, 12/14/2015, 12/14/2016, 11/30/2017  . Influenza,inj,Quad PF,6+ Mos 11/27/2014  . PFIZER SARS-COV-2 Vaccination 04/16/2019, 05/06/2019  . Pneumococcal Polysaccharide-23 10/26/2013  . Pneumococcal-Unspecified 10/26/2013  . Tdap 06/14/2011  . Zoster Recombinat (Shingrix) 11/13/2017, 02/08/2018    Past Medical History:  Diagnosis Date  . Anemia    past history-many yrs ago  . Anginal pain ([place])    being evaluated by Dr. Tyrone Sage, arm pain,"bad indigestion" -no heart related findings as of yet  . Arthritis    hip. back pain  . Complication of anesthesia    s/p Hysterectomy "vagal response "heart stopped" -did not require shocking.  . Coronary artery disease   . Dry eyes   . Esophageal spasm   .  GERD (gastroesophageal reflux disease)   . MS (multiple sclerosis) ([place])    stable-sees [name] every 6 months    Tobacco History: Social History   Tobacco Use  Smoking Status Never Smoker  Smokeless Tobacco Never Used   Counseling given: Not Answered   Outpatient Medications Prior to Visit  Medication Sig Dispense Refill  . amLODipine (NORVASC) 5 MG tablet Take 5 mg by mouth daily.     [name] aspirin EC 81 MG tablet Take 1 tablet (81 mg total) by mouth daily. 90 tablet 3  . Cholecalciferol (VITAMIN D PO) Take 1 tablet by mouth daily.    [name] diltiazem (CARDIZEM CD) 240 MG 24 hr  capsule Take 240 mg by mouth daily. Take 1 capsule by mouth daily    . dupilumab (DUPIXENT) 200 MG/1.14ML prefilled syringe Inject 200 mg into the skin every 14 (fourteen) days. 2 mL 5  . EPINEPHrine 0.3 mg/0.3 mL IJ SOAJ injection epinephrine 0.3 mg/0.3 mL injection, auto-injector  USE AS DIRECTED    . gabapentin (NEURONTIN) 300 MG capsule Take 300 mg by mouth daily as needed.     [name] ipratropium-albuterol (DUONEB) 0.5-2.5 (3) MG/3ML SOLN Take 3 mLs by nebulization 2 (two) times daily. 360 mL 0  . isosorbide mononitrate (IMDUR) 60 MG 24 hr tablet Take 1.5 tablets (90 mg total) by mouth daily. 135 tablet 3  . losartan (COZAAR) 50 MG tablet TAKE 1 & 1/2 TABLETS ONCE DAILY. 135 tablet 3  . meclizine (ANTIVERT) 25 MG tablet Take 1 tablet (25 mg total) by mouth 3 (three) times daily as needed for dizziness. 30 tablet 0  . nitroGLYCERIN (NITROSTAT) 0.4 MG SL tablet PLACE 1 TAB UNDER THE TONGUE AS NEEDED FOR CHEST PAIN MAY REPEAT EVERY 5 MINUTES. 25 tablet 3  . pantoprazole (PROTONIX) 20 MG tablet TAKE 1 TABLET BY MOUTH TWICE DAILY. 60 tablet 0  . PARoxetine (PAXIL) 10 MG tablet Take 5 mg by mouth daily.     [name] Respiratory Therapy Supplies (FLUTTER) DEVI Use as directed 1 each 0  . VYTORIN 10-20 MG per tablet Take 1 tablet by mouth daily.     . DULERA 200-5 MCG/ACT AERO USE 2 PUFFS TWICE DAILY. 13 g 3  . PROAIR HFA 108 (90 Base) MCG/ACT inhaler INHALE 1-2 PUFFS INTO THE LUNGS EVERY SIX HOURS AS NEEDED FOR WHEEZING OR SHORTNESS OF BREATH. 8.5 g 2  . diazepam (VALIUM) 2 MG tablet Take 2 mg by mouth every 6 (six) hours as needed for anxiety. (Patient not taking: Reported on 01/14/2020)     Facility-Administered Medications Prior to Visit  Medication Dose Route Frequency Provider Last Rate Last Admin  . dupilumab (DUPIXENT) prefilled syringe 200 mg  200 mg Subcutaneous Once Mannam, Praveen, MD      . Mepolizumab SOLR 100 mg  100 mg Subcutaneous Q28 days Brand Males, MD   100 mg at 06/14/17 0917  .  Mepolizumab SOLR 100 mg  100 mg Subcutaneous Q28 days Brand Males, MD   100 mg at 07/12/17 0902  . Mepolizumab SOLR 100 mg  100 mg Subcutaneous Q28 days Brand Males, MD   100 mg at 10/31/17 0867    Review of Systems  Review of Systems  Constitutional: Negative.   HENT: Negative.   Respiratory: Positive for cough and chest tightness. Negative for shortness of breath and wheezing.     Physical Exam  BP 124/68 (BP Location: Left Arm, Cuff Size: Normal)   Pulse 65   Temp (!) 97.3 F (  36.3 C) (Other (Comment)) Comment (Src): wrist  Ht 5' 2.5" (1.588 m)   Wt 150 lb (68 kg)   SpO2 97%   BMI 27.00 kg/m  Physical Exam Constitutional:      Appearance: Normal appearance.  HENT:     Head: Normocephalic and atraumatic.     Mouth/Throat:     Mouth: Mucous membranes are moist.     Pharynx: Oropharynx is clear.  Cardiovascular:     Rate and Rhythm: Normal rate and regular rhythm.  Pulmonary:     Effort: Pulmonary effort is normal.     Breath sounds: Normal breath sounds. No wheezing or rhonchi.     Comments: CTA Musculoskeletal:        General: Normal range of motion.  Neurological:     Mental Status: She is alert.  Psychiatric:        Mood and Affect: Mood normal.        Behavior: Behavior normal.        Thought Content: Thought content normal.        Judgment: Judgment normal.      Lab Results:  CBC    Component Value Date/Time   WBC 8.8 03/25/2018 1701   RBC 4.28 03/25/2018 1701   HGB 12.7 03/25/2018 1701   HCT 39.0 03/25/2018 1701   PLT 193 03/25/2018 1701   MCV 91.1 03/25/2018 1701   MCH 29.7 03/25/2018 1701   MCHC 32.6 03/25/2018 1701   RDW 13.2 03/25/2018 1701   LYMPHSABS 1.3 03/25/2018 1701   MONOABS 0.8 03/25/2018 1701   EOSABS 0.0 03/25/2018 1701   BASOSABS 0.0 03/25/2018 1701    BMET    Component Value Date/Time   NA 136 03/25/2018 1701   K 3.0 (L) 03/25/2018 1701   CL 106 03/25/2018 1701   CO2 21 (L) 03/25/2018 1701   GLUCOSE 108 (H)  03/25/2018 1701   BUN 17 03/25/2018 1701   CREATININE 0.72 03/25/2018 1701   CALCIUM 9.2 03/25/2018 1701   GFRNONAA >60 03/25/2018 1701   GFRAA >60 03/25/2018 1701    BNP No results found for: BNP  ProBNP No results found for: PROBNP  Imaging: No results found.   Assessment & Plan:   Asthma - Stable interval; No recent exacerbations or hospitalizations. She has some shortness of breath and chest tightness which she attributes to Multiple sclerosis. FENO today was 32 (22). ACT score 24.  - Off ICS/LABA inhaler and leukotriene inhibitor, does not require SABA - Continues Dupixent 200mg  q 14 days; Albuterol 2 puffs q 6 hours prn  - Recommend Covid-19 booster  - FU in 4 months or sooner if needed   Martyn Ehrich, NP 01/14/2020

## 2020-01-14 NOTE — Addendum Note (Signed)
Addended by: Suzzanne Cloud E on: 01/14/2020 10:50 AM   Modules accepted: Orders

## 2020-01-14 NOTE — Patient Instructions (Signed)
FENO today was 32   Recommendations: Continue Dupixent 200mg  q 14 days Continue Albuterol rescue inhaler 2 puffs every 6 hours as needed  Recommend you get the Covid -19 Booster shot   Orders:  Labs today (CBC)  Follow-up: 4 months with [name]

## 2020-01-14 NOTE — Assessment & Plan Note (Addendum)
-   Stable interval; No recent exacerbations or hospitalizations. She has some shortness of breath and chest tightness which she attributes to Multiple sclerosis. FENO today was 32 (22). ACT score 24.  - Off ICS/LABA inhaler and leukotriene inhibitor, does not require SABA - Continues Dupixent 200mg  q 14 days; Albuterol 2 puffs q 6 hours prn  - Recommend Covid-19 booster  - FU in 4 months or sooner if needed

## 2020-01-16 ENCOUNTER — Other Ambulatory Visit: Payer: Self-pay

## 2020-01-16 ENCOUNTER — Ambulatory Visit (INDEPENDENT_AMBULATORY_CARE_PROVIDER_SITE_OTHER): Payer: Medicare PPO

## 2020-01-16 DIAGNOSIS — J4540 Moderate persistent asthma, uncomplicated: Secondary | ICD-10-CM | POA: Diagnosis not present

## 2020-01-16 MED ORDER — IPRATROPIUM-ALBUTEROL 0.5-2.5 (3) MG/3ML IN SOLN: 3.0000 mL | Freq: Two times a day (BID) | RESPIRATORY_TRACT | 5 refills | Status: DC

## 2020-01-16 MED ORDER — EPINEPHRINE 0.3 MG/0.3ML IJ SOAJ: INTRAMUSCULAR | 5 refills | Status: DC

## 2020-01-16 MED ORDER — DUPILUMAB 200 MG/1.14ML ~~LOC~~ SOSY
200.0000 mg | PREFILLED_SYRINGE | Freq: Once | SUBCUTANEOUS | Status: AC
  Administered 2020-01-16: 200 mg via SUBCUTANEOUS

## 2020-01-16 MED ORDER — EPINEPHRINE 0.3 MG/0.3ML IJ SOAJ: 0.3000 mg | Freq: Once | INTRAMUSCULAR | 5 refills | Status: AC

## 2020-01-20 ENCOUNTER — Telehealth: Payer: Self-pay | Admitting: Internal Medicine

## 2020-01-20 ENCOUNTER — Other Ambulatory Visit: Payer: Self-pay | Admitting: Internal Medicine

## 2020-01-20 NOTE — Telephone Encounter (Signed)
Dupixent Order: 200mg  #2 prefilled syringe Ordered Date: 01/20/20 Expected date of arrival: 01/22/20 Ordered by: [place]: Claria Dice

## 2020-01-22 NOTE — Telephone Encounter (Signed)
Dupixent Shipment Received: 300mg  #2 prefilled syringe Medication arrival date: 01/22/20 Lot #: [id number] Exp date: 01/25/2022 Received by: Elliot Dally

## 2020-01-29 DIAGNOSIS — L814 Other melanin hyperpigmentation: Secondary | ICD-10-CM | POA: Diagnosis not present

## 2020-01-29 DIAGNOSIS — L819 Disorder of pigmentation, unspecified: Secondary | ICD-10-CM | POA: Diagnosis not present

## 2020-01-29 DIAGNOSIS — D2262 Melanocytic nevi of left upper limb, including shoulder: Secondary | ICD-10-CM | POA: Diagnosis not present

## 2020-01-29 DIAGNOSIS — L309 Dermatitis, unspecified: Secondary | ICD-10-CM | POA: Diagnosis not present

## 2020-01-29 DIAGNOSIS — L817 Pigmented purpuric dermatosis: Secondary | ICD-10-CM | POA: Diagnosis not present

## 2020-01-29 DIAGNOSIS — L820 Inflamed seborrheic keratosis: Secondary | ICD-10-CM | POA: Diagnosis not present

## 2020-01-29 DIAGNOSIS — D2272 Melanocytic nevi of left lower limb, including hip: Secondary | ICD-10-CM | POA: Diagnosis not present

## 2020-01-29 DIAGNOSIS — D225 Melanocytic nevi of trunk: Secondary | ICD-10-CM | POA: Diagnosis not present

## 2020-01-29 DIAGNOSIS — L821 Other seborrheic keratosis: Secondary | ICD-10-CM | POA: Diagnosis not present

## 2020-01-30 ENCOUNTER — Encounter: Payer: Self-pay | Admitting: Cardiovascular Disease

## 2020-01-30 ENCOUNTER — Ambulatory Visit (INDEPENDENT_AMBULATORY_CARE_PROVIDER_SITE_OTHER): Payer: Medicare PPO | Admitting: Cardiovascular Disease

## 2020-01-30 ENCOUNTER — Other Ambulatory Visit: Payer: Self-pay

## 2020-01-30 ENCOUNTER — Ambulatory Visit (INDEPENDENT_AMBULATORY_CARE_PROVIDER_SITE_OTHER): Payer: Medicare PPO

## 2020-01-30 DIAGNOSIS — E785 Hyperlipidemia, unspecified: Secondary | ICD-10-CM | POA: Diagnosis not present

## 2020-01-30 DIAGNOSIS — I25119 Atherosclerotic heart disease of native coronary artery with unspecified angina pectoris: Secondary | ICD-10-CM

## 2020-01-30 DIAGNOSIS — K224 Dyskinesia of esophagus: Secondary | ICD-10-CM | POA: Diagnosis not present

## 2020-01-30 DIAGNOSIS — I350 Nonrheumatic aortic (valve) stenosis: Secondary | ICD-10-CM | POA: Diagnosis not present

## 2020-01-30 DIAGNOSIS — I2510 Atherosclerotic heart disease of native coronary artery without angina pectoris: Secondary | ICD-10-CM | POA: Diagnosis not present

## 2020-01-30 DIAGNOSIS — G478 Other sleep disorders: Secondary | ICD-10-CM | POA: Diagnosis not present

## 2020-01-30 DIAGNOSIS — J4540 Moderate persistent asthma, uncomplicated: Secondary | ICD-10-CM | POA: Diagnosis not present

## 2020-01-30 DIAGNOSIS — G35 Multiple sclerosis: Secondary | ICD-10-CM | POA: Diagnosis not present

## 2020-01-30 MED ORDER — DUPILUMAB 200 MG/1.14ML ~~LOC~~ SOSY
200.0000 mg | PREFILLED_SYRINGE | Freq: Once | SUBCUTANEOUS | Status: AC
  Administered 2020-01-30: 200 mg via SUBCUTANEOUS

## 2020-01-30 NOTE — Progress Notes (Signed)
Cardiology Office Note    Date:  02/04/2020   ID:  [name], [name] 1945-11-02, MRN [id number]  PCP:  Robyn Pretty, MD  Cardiologist:  Robyn Majestic, MD   No chief complaint on file.   History of Present Illness:  [name] is a 74 y.o. female who I last saw in a telemedicine visit in May 2020.  She presents for an 38-monthfollow-up evaluation.  Ms. HMihalkohas documented mild CAD by cardiac catheterization in July 2008 by Dr. JRoe [name] She had mild narrowing of 20-30% in the proximal and 10-20% in the mid left circumflex vessel. 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 She has been on medical therapy. She also has a history of hypertension,. Her last stress test was done in March 2014 were she had nonspecific T changes and nondiagnostic 0.5-1 mm inferolateral ST segment changes with stress. Scintigraphic images revealed normal perfusion and function.  She has a history of multiple sclerosis and is followed by Dr. DDelphia [name] [name] She has a history of hyperlipidemia for which she takes Vytorin 10/20 and GERD for which he takes over-the-counter Prilosec. She has experienced recent episodes of chest pain which have been occurring almost weekly. She experiences squeezing in her arms and jaw. She denies chest pressure. The symptoms are not associated with activity and typically resolve on her own. She has taken nitroglycerin with questionable benefit. She also notes calf discomfort at night while sleeping. She denies restless legs.  She was started on a human monoclonal antibody ocrelizumab for her multiple sclerosis and admits to improvement in symptomatology. When I saw her  she had experienced recurrent episodes of chest pain with some atypical features. She underwent a nuclear perfusion study in December 2016 which revealed normal perfusion with an ejection fraction of 66%. She developed recurrent chest pain in January and  again was felt to be stable cardiovascularly. [facility] due to esophageal lower esophageal sphincter spasm and inability to relax appropriately. Her symptoms have improved with Protonix and she is now being weaned off Protonix and has been started on Zantac.  She has been evaluated on several occasions by [name] [name] his most recent evaluation in March 2019. He had seen her in December 2018 with chest pain more consistent with esophageal spasm. An echocardiogram in December 2018 showed an EF of 60 to 65% with grade 1 diastolic dysfunction, mild aortic stenosis with trivial AR. She has had issues with asthma. Shealso had experienced some palpitations and a 24-hour monitor revealed short bursts of SVT, PACs and PVCs. Palpitations have improved though she still experiences some palpitations at night while falling asleep. She eats chocolate on a daily basis. Her sleep is very poor. She has frequent awakenings. She snores. She has nocturia at least 3 times per night. He denies any chest pain.   I  saw her in September 2019 with her palpitations I recommended discontinuance of amlodipine and instituted Cardizem CD 240 mg. We discussed avoidance of chocolate which contains caffeine. Due to concerns for sleep apnea I also recommended she undergo a sleep evaluation. She has also seen pulmonary since her last evaluation with complaints of wheezing and on March 25, 2018 was evaluated in the emergency room with dizziness. She had fallen over a dog gateand struck her head. A head CT did not reveal any acute intracranial abnormality. There was mild  age-appropriate cortical atrophy with moderate to severe chronic microvascular ischemic changes of the white matter. She is scheduled to undergo a sleep study on January 27. She continues to experience occasional palpitations at night. She recently was started on a  prednisone taper due to her wheezing has had some chest congestion.  She underwent a sleep study on April 23, 2018.She was not found to have significant sleep apnea and her overall AHI was 1.1/h.  However,there was mildly sleep apnea during REM sleep with an AHI of 7.0/h.  There was evidence for soft snoring and her oxygen nadir was 88%.  I last evaluated her in a telemedicine visit on Aug 01, 2018.  At that time, she was no longer  taking her multiple sclerosis infusion, due to exacerbation of asthmatic inflammation.  She is now on dupixant and is followed by Dr. Chase [name].  She does have issues at times where she feels like she just cannot get it very deep breath.  She denies chest pain.  She denies palpitations.  She continues to see her doctor in [place] for her multiple sclerosis.  Since her last evaluation with me, she has been evaluated by Almyra [name], [place] in July 2021.  She had undergone a repeat Myoview study on March 28, 2019 which showed an EF of 66% and no evidence for prior infarct or ischemia and was interpreted as low risk.  When I last saw her she was experiencing some atypical chest discomfort occurring approximately 2-3 times per month.  He had recommended titration of isosorbide to 90 mg to help with potential esophageal spasm.  She has recently seen with our pulmonary with Robyn Pitter, NP on January 14, 2020.  Her asthma was felt to be well controlled..  It was recommended that she stop Zyrtec in addition to montelukast and Singulair and reduce to present to 200 mg every 2 weeks.  Follow-up in 3 months was recommended to do an exhaled nitric oxide testing.  Presently, she denies any recent chest pain of ischemic etiology.  At times she notes a sharp twinge of discomfort.  She apparently is no longer taking amlodipine and is on diltiazem 240 mg daily, losartan 50 mg daily for hypertension and continues to be on isosorbide mononitrate 90 mg.  She is on Vytorin 10/20 for  hyperlipidemia.  She is on do present injection every 14 days.  She continues to be on Protonix 20 mg twice a day.  She presents for evaluation.  Past Medical History:  Diagnosis Date  . Anemia    past history-many yrs ago  . Anginal pain ([place])    being evaluated by Dr. Tyrone Sage, arm pain,"bad indigestion" -no heart related findings as of yet  . Arthritis    hip. back pain  . Complication of anesthesia    s/p Hysterectomy "vagal response "heart stopped" -did not require shocking.  . Coronary artery disease   . Dry eyes   . Esophageal spasm   . GERD (gastroesophageal reflux disease)   . MS (multiple sclerosis) ([place])    stable-sees [name] every 6 months    Past Surgical History:  Procedure Laterality Date  . ABDOMINAL HYSTERECTOMY    . BLEPHAROPLASTY Bilateral   . CARDIAC CATHETERIZATION  10/04/06   MINOR CAD,SINGLE VESSEL INVOLVING THE CIRCUMFLEX. 20 TO 30% PROXIMALLY AND 10 TO 20% IN THE MIDDLE SEGMENT.MILD MUSCLE BRIDGING, MID LAD.NORMAL RCA.NORMAL LV FUNCTION.NORMAL MITRAL AND AORTIC VALVE.NORMAL APPEARING AORTA,THORACIC AND ABDOMINAL.NORMAL RENAL ARTERIES.  [name] CARDIOLOGY NUCLEAR MED STUDY  06/22/12  NL LV FUNCTION,EF 68%,NL WALL MOTION.  [name] CAROTID DUPLEX  07/02/11   HWE:XHBZ SOFT PLAQUE NOTED DISTAL CCA AND ORGIN AND PROXIMAL ICA,LEFT>RIGHT.NO ICA STENOSIS. VERTEBRAL ARTERY FLOW IS ANTEGRADE.  [name] CESAREAN SECTION     x2   . COLONOSCOPY WITH PROPOFOL N/A 12/11/2014   Procedure: COLONOSCOPY WITH PROPOFOL;  Surgeon: Ronald Lobo, MD;  Location: [facility];  Service: Endoscopy;  Laterality: N/A;  . KNEE ARTHROSCOPY Left    scope  . PARATHYROIDECTOMY     partial-many years ago  . thumb surgery Bilateral    built up and bone removal  . TONSILLECTOMY    . TRANSTHORACIC ECHOCARDIOGRAM  07/02/11   LV CAVITY SIZE IS NORMAL. SYSTOLIC FUNCTION WAS NORMAL.EF=55% TO 60%.INCREASED RELATIVE CONTRIBUTION OF ATRIAL CONTRACTION TO VENTRICULAR FILLING;MAYBE DUE TO HYPOVOLEMIA. AV=MILD REGURG.     Current Medications: Outpatient Medications Prior to Visit  Medication Sig Dispense Refill  . albuterol (PROAIR HFA) 108 (90 Base) MCG/ACT inhaler INHALE 1-2 PUFFS INTO THE LUNGS EVERY SIX HOURS AS NEEDED FOR WHEEZING OR SHORTNESS OF BREATH. 8.5 g 2  . aspirin EC 81 MG tablet Take 1 tablet (81 mg total) by mouth daily. 90 tablet 3  . Cholecalciferol (VITAMIN D PO) Take 1 tablet by mouth daily.    [name] diltiazem (CARDIZEM CD) 240 MG 24 hr capsule Take 240 mg by mouth daily. Take 1 capsule by mouth daily    . dupilumab (DUPIXENT) 200 MG/1.14ML prefilled syringe Inject 200 mg into the skin every 14 (fourteen) days. 2 mL 5  . EPINEPHRINE 0.3 mg/0.3 mL IJ SOAJ injection USE AS DIRECTED 2 each 3  . gabapentin (NEURONTIN) 300 MG capsule Take 300 mg by mouth daily as needed.     [name] ipratropium-albuterol (DUONEB) 0.5-2.5 (3) MG/3ML SOLN Take 3 mLs by nebulization 2 (two) times daily. 360 mL 5  . isosorbide mononitrate (IMDUR) 60 MG 24 hr tablet Take 1.5 tablets (90 mg total) by mouth daily. 135 tablet 3  . losartan (COZAAR) 50 MG tablet TAKE 1 & 1/2 TABLETS ONCE DAILY. 135 tablet 3  . meclizine (ANTIVERT) 25 MG tablet Take 1 tablet (25 mg total) by mouth 3 (three) times daily as needed for dizziness. 30 tablet 0  . nitroGLYCERIN (NITROSTAT) 0.4 MG SL tablet PLACE 1 TAB UNDER THE TONGUE AS NEEDED FOR CHEST PAIN MAY REPEAT EVERY 5 MINUTES. 25 tablet 3  . pantoprazole (PROTONIX) 20 MG tablet TAKE 1 TABLET BY MOUTH TWICE DAILY. 60 tablet 0  . PARoxetine (PAXIL) 10 MG tablet Take 5 mg by mouth daily.     [name] Respiratory Therapy Supplies (FLUTTER) DEVI Use as directed 1 each 0  . VYTORIN 10-20 MG per tablet Take 1 tablet by mouth daily.     [name] amLODipine (NORVASC) 5 MG tablet Take 5 mg by mouth daily.     . diazepam (VALIUM) 2 MG tablet Take 2 mg by mouth every 6 (six) hours as needed for anxiety. (Patient not taking: Reported on 01/14/2020)     Facility-Administered Medications Prior to Visit  Medication  Dose Route Frequency Provider Last Rate Last Admin  . dupilumab (DUPIXENT) prefilled syringe 200 mg  200 mg Subcutaneous Once Mannam, Praveen, MD      . Mepolizumab SOLR 100 mg  100 mg Subcutaneous Q28 days Brand Males, MD   100 mg at 06/14/17 0917  . Mepolizumab SOLR 100 mg  100 mg Subcutaneous Q28 days Brand Males, MD   100 mg at 07/12/17 0902  . Mepolizumab SOLR  100 mg  100 mg Subcutaneous Q28 days Brand Males, MD   100 mg at 10/31/17 0903     Allergies:   Crestor [rosuvastatin], Iodinated diagnostic agents, Vicodin [hydrocodone-acetaminophen], and Isovue [iopamidol]   Social History   Socioeconomic History  . Marital status: Divorced    Spouse name: Not on file  . Number of children: Not on file  . Years of education: Not on file  . Highest education level: Not on file  Occupational History  . Occupation: retired  Tobacco Use  . Smoking status: Never Smoker  . Smokeless tobacco: Never Used  Vaping Use  . Vaping Use: Never used  Substance and Sexual Activity  . Alcohol use: Yes    Alcohol/week: 0.0 standard drinks    Comment: wine occ.  . Drug use: No  . Sexual activity: Not on file  Other Topics Concern  . Not on file  Social History Narrative  . Not on file   Social Determinants of Health   Financial Resource Strain:   . Difficulty of Paying Living Expenses: Not on file  Food Insecurity:   . Worried About Charity fundraiser in the Last Year: Not on file  . Ran Out of Food in the Last Year: Not on file  Transportation Needs:   . Lack of Transportation (Medical): Not on file  . Lack of Transportation (Non-Medical): Not on file  Physical Activity:   . Days of Exercise per Week: Not on file  . Minutes of Exercise per Session: Not on file  Stress:   . Feeling of Stress : Not on file  Social Connections:   . Frequency of Communication with Friends and Family: Not on file  . Frequency of Social Gatherings with Friends and Family: Not on file  .  Attends Religious Services: Not on file  . Active Member of Clubs or Organizations: Not on file  . Attends Archivist Meetings: Not on file  . Marital Status: Not on file     Family History:  The patient's family history includes Asthma in her father; Bladder Cancer in her father; Heart disease in her mother; Heart failure in her father; Hyperlipidemia in her brother.   ROS General: Negative; No fevers, chills, or night sweats;  HEENT: Negative; No changes in vision or hearing, sinus congestion, difficulty swallowing Pulmonary: History of asthma Cardiovascular: Negative; No chest pain, presyncope, syncope, palpitations GI: Negative; No nausea, vomiting, diarrhea, or abdominal pain GU: Negative; No dysuria, hematuria, or difficulty voiding Musculoskeletal: Negative; no myalgias, joint pain, or weakness Hematologic/Oncology: Negative; no easy bruising, bleeding Endocrine: Negative; no heat/cold intolerance; no diabetes Neuro: Negative; no changes in balance, headaches Skin: Negative; No rashes or skin lesions Psychiatric: Negative; No behavioral problems, depression Sleep: Negative; No snoring, daytime sleepiness, hypersomnolence, bruxism, restless legs, hypnogognic hallucinations, no cataplexy Other comprehensive 14 point system review is negative.   PHYSICAL EXAM:   VS:  BP 131/63   Pulse (!) 59   Ht _0  (1.575 m)   Wt 149 lb 9.6 oz (67.9 kg)   SpO2 97%   BMI 27.36 kg/m     Repeat blood pressure by me was 126/68  Wt Readings from Last 3 Encounters:  01/30/20 149 lb 9.6 oz (67.9 kg)  01/14/20 150 lb (68 kg)  10/14/19 148 lb 3.2 oz (67.2 kg)    General: Alert, oriented, no distress.  Skin: normal turgor, no rashes, warm and dry HEENT: Normocephalic, atraumatic. Pupils equal round and reactive to light;  sclera anicteric; extraocular muscles intact;  Nose without nasal septal hypertrophy Mouth/Parynx benign; Mallinpatti scale Neck: No JVD, no carotid bruits;  normal carotid upstroke Lungs: clear to ausculatation and percussion; no wheezing or rales Chest wall: without tenderness to palpitation Heart: PMI not displaced, RRR, s1 s2 normal, 1/6 systolic murmur, no diastolic murmur, no rubs, gallops, thrills, or heaves Abdomen: soft, nontender; no hepatosplenomehaly, BS+; abdominal aorta nontender and not dilated by palpation. Back: no CVA tenderness Pulses 2+ Musculoskeletal: full range of motion, normal strength, no joint deformities Extremities: no clubbing cyanosis or edema, Homan's sign negative  Neurologic: grossly nonfocal; Cranial nerves grossly wnl Psychologic: Normal mood and affect    Studies/Labs Reviewed:   EKG:  EKG is  ordered today.  Sinus bradycardia at 59, LAE, no ectopy  Recent Labs: BMP Latest Ref Rng & Units 03/25/2018 03/19/2017 10/07/2016  Glucose 70 - 99 mg/dL 108(H) 101(H) 128(H)  BUN 8 - 23 mg/dL _0 Creatinine 0.44 - 1.00 mg/dL 0.72 0.72 0.77  Sodium 135 - 145 mmol/L 136 140 141  Potassium 3.5 - 5.1 mmol/L 3.0(L) 3.8 4.0  Chloride 98 - 111 mmol/L 106 107 108  CO2 22 - 32 mmol/L 21(L) 23 23  Calcium 8.9 - 10.3 mg/dL 9.2 8.9 9.2     Hepatic Function Latest Ref Rng & Units 03/19/2017 05/10/2009  Total Protein 6.5 - 8.1 g/dL 6.4(L) 5.9(L)  Albumin 3.5 - 5.0 g/dL 3.9 3.4(L)  AST 15 - 41 U/L 21 19  ALT 14 - 54 U/L 24 22  Alk Phosphatase 38 - 126 U/L 75 70  Total Bilirubin 0.3 - 1.2 mg/dL 0.6 0.4    CBC Latest Ref Rng & Units 01/14/2020 03/25/2018 08/08/2017  WBC 4.0 - 10.5 K/uL 6.1 8.8 5.8  Hemoglobin 12.0 - 15.0 g/dL 13.3 12.7 14.4  Hematocrit 36 - 46 % 39.5 39.0 43.0  Platelets 150 - 400 K/uL 213.0 193 250.0   Lab Results  Component Value Date   MCV 92.2 01/14/2020   MCV 91.1 03/25/2018   MCV 91.7 08/08/2017   Lab Results  Component Value Date   TSH 0.735 07/02/2011   No results found for: HGBA1C   BNP No results found for: BNP  ProBNP No results found for: PROBNP   Lipid Panel  No  results found for: CHOL, TRIG, HDL, CHOLHDL, VLDL, LDLCALC, LDLDIRECT, LABVLDL   RADIOLOGY: No results found.   Additional studies/ records that were reviewed today include:  I reviewed the patient's record since her last evaluation with me in May 2020.  ASSESSMENT:    1. Coronary artery disease involving native coronary artery of native heart without angina pectoris   2. Esophageal spasm   3. Multiple sclerosis ([place])   4. UARS (upper airway resistance syndrome)   5. Hyperlipidemia LDL goal <70   6. Mild aortic stenosis    PLAN:  Ms. [name] is a 74 year old female with previous documentation of mild CAD as well as muscle bridging.  She has been documented as normal systolic function with grade 1 diastolic dysfunction and mild aortic stenosis.  She has multiple sclerosis as well as asthma.  She is now on new present followed by Dr. Chase [name] and was most recently evaluated in lower pulmonary clinic by Robyn Pitter, NP.  Presently, she is doing well without anginal symptomatology.  She has experienced episodes of somewhat atypical chest pain.  She has undergone Myoview studies with the most recent in December 2020 which remained low risk.  Her blood pressure today is controlled on diltiazem 240 mg daily and losartan 50 mg.  She continues to be on Vytorin 10/20 for hyperlipidemia.  In on September 16, 2019 LDL cholesterol was 70 with total cholesterol 142.  She has felt improved on her increased isosorbide which was titrated by [name], PA due to concerns for esophageal spasm.  She is now on gabapentin as needed for neuropathy.  She will continue current therapy.  She is sleeping well and on her prior sleep study was felt to have increased upper airway resistance syndrome.  She did not have sleep apnea overall but had mild sleep apnea during REM sleep and evidence for mild snoring.  As long as she remains stable I will see her in 1 year for reevaluation   Medication Adjustments/Labs and  Tests Ordered: Current medicines are reviewed at length with the patient today.  Concerns regarding medicines are outlined above.  Medication changes, Labs and Tests ordered today are listed in the Patient Instructions below. Patient Instructions  Medication Instructions:  No Changes In Medications at this time.  *If you need a refill on your cardiac medications before your next appointment, please call your pharmacy*  Follow-Up: At [facility], you and your health needs are our priority.  As part of our continuing mission to provide you with exceptional heart care, we have created designated Provider Care Teams.  These Care Teams include your primary Cardiologist (physician) and Advanced Practice Providers (APPs -  Physician Assistants and Nurse Practitioners) who all work together to provide you with the care you need, when you need it.  Your next appointment:   1 year(s)  The format for your next appointment:   In Person  Provider:   Shelva Majestic, MD    Signed, Robyn Majestic, MD  02/04/2020 11:55 AM    [place] [address] [place], [place], [place]  [address] Phone: [phone]

## 2020-01-30 NOTE — Patient Instructions (Signed)
Medication Instructions:  No Changes In Medications at this time.  *If you need a refill on your cardiac medications before your next appointment, please call your pharmacy*  Follow-Up: At [facility], you and your health needs are our priority.  As part of our continuing mission to provide you with exceptional heart care, we have created designated Provider Care Teams.  These Care Teams include your primary Cardiologist (physician) and Advanced Practice Providers (APPs -  Physician Assistants and Nurse Practitioners) who all work together to provide you with the care you need, when you need it.  Your next appointment:   1 year(s)  The format for your next appointment:   In Person  Provider:   Shelva Majestic, MD

## 2020-01-30 NOTE — Progress Notes (Signed)
Have you been hospitalized within the last 10 days?  No Do you have a fever?  No Do you have a cough?  No Do you have a headache or sore throat? No Do you have your Epi Pen visible and is it within date?  Yes 

## 2020-01-31 DIAGNOSIS — M7061 Trochanteric bursitis, right hip: Secondary | ICD-10-CM | POA: Diagnosis not present

## 2020-02-04 ENCOUNTER — Encounter: Payer: Self-pay | Admitting: Cardiovascular Disease

## 2020-02-13 ENCOUNTER — Ambulatory Visit (INDEPENDENT_AMBULATORY_CARE_PROVIDER_SITE_OTHER): Payer: Medicare PPO

## 2020-02-13 ENCOUNTER — Other Ambulatory Visit: Payer: Self-pay

## 2020-02-13 DIAGNOSIS — J4540 Moderate persistent asthma, uncomplicated: Secondary | ICD-10-CM

## 2020-02-13 MED ORDER — DUPILUMAB 200 MG/1.14ML ~~LOC~~ SOSY
200.0000 mg | PREFILLED_SYRINGE | Freq: Once | SUBCUTANEOUS | Status: AC
  Administered 2020-02-13: 200 mg via SUBCUTANEOUS

## 2020-02-13 NOTE — Progress Notes (Signed)
Have you been hospitalized within the last 10 days?  No Do you have a fever?  No Do you have a cough?  No Do you have a headache or sore throat? No Do you have your Epi Pen visible and is it within date?  Yes 

## 2020-02-17 ENCOUNTER — Other Ambulatory Visit: Payer: Self-pay | Admitting: Internal Medicine

## 2020-02-17 ENCOUNTER — Telehealth: Payer: Self-pay | Admitting: Internal Medicine

## 2020-02-17 NOTE — Telephone Encounter (Signed)
Dupixent Order: 200mg  #2 prefilled syringe Ordered Date: 02/17/20 Expected date of arrival: 02/18/20 Ordered by: [place]: Lennette Bihari

## 2020-02-18 NOTE — Telephone Encounter (Signed)
Dupixent Shipment Received: 300mg  #2 prefilled syringe Medication arrival date: 02/18/20 Lot #: [id number] Exp date: 07/26/2022 Received by: Elliot Dally

## 2020-02-28 ENCOUNTER — Ambulatory Visit (INDEPENDENT_AMBULATORY_CARE_PROVIDER_SITE_OTHER): Payer: Medicare PPO

## 2020-02-28 ENCOUNTER — Other Ambulatory Visit: Payer: Self-pay

## 2020-02-28 DIAGNOSIS — J4540 Moderate persistent asthma, uncomplicated: Secondary | ICD-10-CM | POA: Diagnosis not present

## 2020-02-28 MED ORDER — DUPILUMAB 200 MG/1.14ML ~~LOC~~ SOSY
200.0000 mg | PREFILLED_SYRINGE | Freq: Once | SUBCUTANEOUS | Status: AC
  Administered 2020-02-28: 200 mg via SUBCUTANEOUS

## 2020-02-28 NOTE — Progress Notes (Signed)
Have you been hospitalized within the last 10 days?  No Do you have a fever?  No Do you have a cough?  No Do you have a headache or sore throat? No Do you have your Epi Pen visible and is it within date?  Yes 

## 2020-03-04 DIAGNOSIS — M25551 Pain in right hip: Secondary | ICD-10-CM | POA: Diagnosis not present

## 2020-03-04 DIAGNOSIS — M7062 Trochanteric bursitis, left hip: Secondary | ICD-10-CM | POA: Diagnosis not present

## 2020-03-13 ENCOUNTER — Other Ambulatory Visit: Payer: Self-pay

## 2020-03-13 ENCOUNTER — Telehealth: Payer: Self-pay | Admitting: Cardiovascular Disease

## 2020-03-13 ENCOUNTER — Ambulatory Visit (INDEPENDENT_AMBULATORY_CARE_PROVIDER_SITE_OTHER): Payer: Medicare PPO

## 2020-03-13 DIAGNOSIS — J4540 Moderate persistent asthma, uncomplicated: Secondary | ICD-10-CM | POA: Diagnosis not present

## 2020-03-13 MED ORDER — DUPILUMAB 200 MG/1.14ML ~~LOC~~ SOSY
200.0000 mg | PREFILLED_SYRINGE | Freq: Once | SUBCUTANEOUS | Status: AC
  Administered 2020-03-13: 200 mg via SUBCUTANEOUS

## 2020-03-13 NOTE — Telephone Encounter (Signed)
Pt updated with MD's recommendations and also verbal order to schedule appointment for SOB. Pt verbalized understanding. Appointment scheduled for 12/28

## 2020-03-13 NOTE — Progress Notes (Signed)
Have you been hospitalized within the last 10 days?  No Do you have a fever?  No Do you have a cough?  No Do you have a headache or sore throat? No Do you have your Epi Pen visible and is it within date?  Yes 

## 2020-03-13 NOTE — Telephone Encounter (Signed)
Spoke to pt. She report experiencing SOB with exertion since Monday but denies swelling, dizziness, or any other symptoms. Pt also report at 4 am on Wednesday, she woke feeling like he heart was racing. No other symptoms BP today 137/83, 123/73, 132/74  Will forward to MD for recommendations

## 2020-03-13 NOTE — Telephone Encounter (Signed)
Recommend she continue to monitor.  If she continues to experience nocturnal palpitations may be best for her to take her diltiazem at night rather than in the morning.  She did not meet criteria for sleep apnea on her sleep study.  If she continues to have palpitations we may consider a ZIO monitor

## 2020-03-13 NOTE — Telephone Encounter (Signed)
Pt c/o BP issue: STAT if pt c/o blurred vision, one-sided weakness or slurred speech  1. What are your last 5 BP readings? 137/83 123/76. 132/74- this is high for her  2. Are you having any other symptoms (ex. Dizziness, headache, blurred vision, passed out)?  Dizzy, headache- might be because she have bot taken Gabapentin for a few days- these are some of the same side effects if you do not take thisys  3. What is your BP issue? High blood pressure for pt

## 2020-03-16 ENCOUNTER — Telehealth: Payer: Self-pay | Admitting: Internal Medicine

## 2020-03-16 NOTE — Telephone Encounter (Signed)
Dupixent Order: 300mg  #2 prefilled syringe Ordered Date: 03/16/20 Expected date of arrival: 03/18/20 Ordered by: [place]: Lennette Bihari

## 2020-03-18 NOTE — Telephone Encounter (Signed)
Dupixent Shipment Received: 200mg  #2 prefilled syringe Medication arrival date: 03/18/20 Lot #: [id number] Exp date: 06/25/2021 Received by: Elliot Dally

## 2020-03-23 DIAGNOSIS — D72819 Decreased white blood cell count, unspecified: Secondary | ICD-10-CM | POA: Diagnosis not present

## 2020-03-23 DIAGNOSIS — G4700 Insomnia, unspecified: Secondary | ICD-10-CM | POA: Diagnosis not present

## 2020-03-23 DIAGNOSIS — G35 Multiple sclerosis: Secondary | ICD-10-CM | POA: Diagnosis not present

## 2020-03-23 DIAGNOSIS — R9089 Other abnormal findings on diagnostic imaging of central nervous system: Secondary | ICD-10-CM | POA: Diagnosis not present

## 2020-03-23 DIAGNOSIS — D849 Immunodeficiency, unspecified: Secondary | ICD-10-CM | POA: Diagnosis not present

## 2020-03-24 ENCOUNTER — Encounter: Payer: Self-pay | Admitting: Cardiology

## 2020-03-24 ENCOUNTER — Ambulatory Visit: Payer: Medicare PPO | Admitting: Cardiology

## 2020-03-24 ENCOUNTER — Other Ambulatory Visit: Payer: Self-pay

## 2020-03-24 VITALS — BP 132/64 | HR 64 | Ht 62.5 in | Wt 148.8 lb

## 2020-03-24 DIAGNOSIS — E785 Hyperlipidemia, unspecified: Secondary | ICD-10-CM | POA: Diagnosis not present

## 2020-03-24 DIAGNOSIS — I2510 Atherosclerotic heart disease of native coronary artery without angina pectoris: Secondary | ICD-10-CM

## 2020-03-24 DIAGNOSIS — R002 Palpitations: Secondary | ICD-10-CM

## 2020-03-24 DIAGNOSIS — I350 Nonrheumatic aortic (valve) stenosis: Secondary | ICD-10-CM | POA: Diagnosis not present

## 2020-03-24 DIAGNOSIS — R0989 Other specified symptoms and signs involving the circulatory and respiratory systems: Secondary | ICD-10-CM | POA: Diagnosis not present

## 2020-03-24 DIAGNOSIS — G35 Multiple sclerosis: Secondary | ICD-10-CM

## 2020-03-24 NOTE — Assessment & Plan Note (Signed)
Asymptomatic- check dopplers

## 2020-03-24 NOTE — Assessment & Plan Note (Signed)
On steroid dose pack

## 2020-03-24 NOTE — Patient Instructions (Signed)
Medication Instructions:  Continue current medications  *If you need a refill on your cardiac medications before your next appointment, please call your pharmacy*   Lab Work: None Ordered   Testing/Procedures: Your physician has requested that you have a carotid duplex. This test is an ultrasound of the carotid arteries in your neck. It looks at blood flow through these arteries that supply the brain with blood. Allow one hour for this exam. There are no restrictions or special instructions.   Follow-Up: At [facility], you and your health needs are our priority.  As part of our continuing mission to provide you with exceptional heart care, we have created designated Provider Care Teams.  These Care Teams include your primary Cardiologist (physician) and Advanced Practice Providers (APPs -  Physician Assistants and Nurse Practitioners) who all work together to provide you with the care you need, when you need it.  We recommend signing up for the patient portal called "MyChart".  Sign up information is provided on this After Visit Summary.  MyChart is used to connect with patients for Virtual Visits (Telemedicine).  Patients are able to view lab/test results, encounter notes, upcoming appointments, etc.  Non-urgent messages can be sent to your provider as well.   To learn more about what you can do with MyChart, go to [email or web address].    Your next appointment:   5 month(s)  The format for your next appointment:   In Person  Provider:   You may see Nicki Guadalajara, MD or one of the following Advanced Practice Providers on your designated Care Team:    Azalee Course, PA-C  Micah Flesher, PA-C or   Judy Pimple, [place]

## 2020-03-24 NOTE — Assessment & Plan Note (Signed)
Pt had noted some PM palpitations a few months ago- these have resolved.

## 2020-03-24 NOTE — Assessment & Plan Note (Signed)
Echo 2018

## 2020-03-24 NOTE — Assessment & Plan Note (Signed)
Dr Renne Crigler follows

## 2020-03-24 NOTE — Progress Notes (Signed)
Cardiology Office Note:    Date:  03/24/2020   ID:  Amare, Dinallo [birth year], MRN [id number]  PCP:  [name], MD  Cardiologist:  Shelva Majestic, MD  Electrophysiologist:  None   Referring MD: [name], MD   No chief complaint on file.   History of Present Illness:    [name] is a pleasant 74 y.o. female with a hx of mild coronary disease and multiple sclerosis followed by Dr Claiborne Billings.  Her son is an Magazine features editor in [place].  The patient had mild CAD at cath in '08 and has had subsequent negative nuclear stress testing. Her last stress was done in December 2020.  Echocardiogram in December 2018 showed normal LV function with mild aortic stenosis.  She has treated hypertension.  She has dyslipidemia, this is followed by Dr. Shelia Media.  Patient is in the office today for follow-up.  She had had some issues with nighttime palpitations.  It sounded like she was having brief tachycardia.  She takes her diltiazem 240 mg at night.  She is not on a beta-blocker.  The patient says her symptoms have resolved spontaneously.  She is otherwise doing well, she did have a multiple sclerosis flareup and is on a steroid Dosepak.  Past Medical History:  Diagnosis Date  . Anemia    past history-many yrs ago  . Anginal pain ([place])    being evaluated by Dr. Tyrone Sage, arm pain,"bad indigestion" -no heart related findings as of yet  . Arthritis    hip. back pain  . Complication of anesthesia    s/p Hysterectomy "vagal response "heart stopped" -did not require shocking.  . Coronary artery disease   . Dry eyes   . Esophageal spasm   . GERD (gastroesophageal reflux disease)   . MS (multiple sclerosis) ([place])    stable-sees [name] every 6 months    Past Surgical History:  Procedure Laterality Date  . ABDOMINAL HYSTERECTOMY    . BLEPHAROPLASTY Bilateral   . CARDIAC CATHETERIZATION  10/04/06   MINOR CAD,SINGLE VESSEL INVOLVING THE CIRCUMFLEX. 20 TO 30% PROXIMALLY AND 10 TO 20% IN  THE MIDDLE SEGMENT.MILD MUSCLE BRIDGING, MID LAD.NORMAL RCA.NORMAL LV FUNCTION.NORMAL MITRAL AND AORTIC VALVE.NORMAL APPEARING AORTA,THORACIC AND ABDOMINAL.NORMAL RENAL ARTERIES.  [name] CARDIOLOGY NUCLEAR MED STUDY  06/22/12   NL LV FUNCTION,EF 68%,NL WALL MOTION.  [name] CAROTID DUPLEX  07/02/11   [id number] SOFT PLAQUE NOTED DISTAL CCA AND ORGIN AND PROXIMAL ICA,LEFT>RIGHT.NO ICA STENOSIS. VERTEBRAL ARTERY FLOW IS ANTEGRADE.  [name] CESAREAN SECTION     x2   . COLONOSCOPY WITH PROPOFOL N/A 12/11/2014   Procedure: COLONOSCOPY WITH PROPOFOL;  Surgeon: Ronald Lobo, MD;  Location: [facility];  Service: Endoscopy;  Laterality: N/A;  . KNEE ARTHROSCOPY Left    scope  . PARATHYROIDECTOMY     partial-many years ago  . thumb surgery Bilateral    built up and bone removal  . TONSILLECTOMY    . TRANSTHORACIC ECHOCARDIOGRAM  07/02/11   LV CAVITY SIZE IS NORMAL. SYSTOLIC FUNCTION WAS NORMAL.EF=55% TO 60%.INCREASED RELATIVE CONTRIBUTION OF ATRIAL CONTRACTION TO VENTRICULAR FILLING;MAYBE DUE TO HYPOVOLEMIA. AV=MILD REGURG.    Current Medications: Current Meds  Medication Sig  . albuterol (PROAIR HFA) 108 (90 Base) MCG/ACT inhaler INHALE 1-2 PUFFS INTO THE LUNGS EVERY SIX HOURS AS NEEDED FOR WHEEZING OR SHORTNESS OF BREATH.  [name] Cholecalciferol (VITAMIN D PO) Take 1 tablet by mouth daily.  . diazepam (VALIUM) 2 MG tablet Take 2 mg by mouth every 6 (six) hours as  needed for anxiety.  [name] diltiazem (CARDIZEM CD) 240 MG 24 hr capsule Take 240 mg by mouth daily. Take 1 capsule by mouth daily  . dupilumab (DUPIXENT) 200 MG/1. prefilled syringe Inject 200 mg into the skin every 14 (fourteen) days.  [name] EPINEPHRINE 0.3 mg/0.3 mL IJ SOAJ injection USE AS DIRECTED  . gabapentin (NEURONTIN) 300 MG capsule Take 300 mg by mouth daily as needed.   [name] ipratropium-albuterol (DUONEB) 0.5-2.5 (3) MG/3ML SOLN Take 3 mLs by nebulization 2 (two) times daily. (Patient taking differently: Take 3 mLs by nebulization 2 (two) times daily. Pt  takes as needed)  . isosorbide mononitrate (IMDUR) 60 MG 24 hr tablet Take 1.5 tablets (90 mg total) by mouth daily.  [name] losartan (COZAAR) 50 MG tablet TAKE 1 & 1/2 TABLETS ONCE DAILY.  . meclizine (ANTIVERT) 25 MG tablet Take 1 tablet (25 mg total) by mouth 3 (three) times daily as needed for dizziness.  . nitroGLYCERIN (NITROSTAT) 0.4 MG SL tablet PLACE 1 TAB UNDER THE TONGUE AS NEEDED FOR CHEST PAIN MAY REPEAT EVERY 5 MINUTES.  . pantoprazole (PROTONIX) 20 MG tablet TAKE 1 TABLET BY MOUTH TWICE DAILY. (Patient taking differently: Pt takes once daily)  . PARoxetine (PAXIL) 10 MG tablet Take 5 mg by mouth daily.   [name] Respiratory Therapy Supplies (FLUTTER) DEVI Use as directed  . VYTORIN 10-20 MG per tablet Take 1 tablet by mouth daily.    Current Facility-Administered Medications for the 03/24/20 encounter (Office Visit) with Abelino Derrick, PA-C  Medication  . dupilumab (DUPIXENT) prefilled syringe 200 mg  . Mepolizumab SOLR 100 mg  . Mepolizumab SOLR 100 mg  . Mepolizumab SOLR 100 mg     Allergies:   Crestor [rosuvastatin], Iodinated diagnostic agents, Vicodin [hydrocodone-acetaminophen], and Isovue [iopamidol]   Social History   Socioeconomic History  . Marital status: Divorced    Spouse name: Not on file  . Number of children: Not on file  . Years of education: Not on file  . Highest education level: Not on file  Occupational History  . Occupation: retired  Tobacco Use  . Smoking status: Never Smoker  . Smokeless tobacco: Never Used  Vaping Use  . Vaping Use: Never used  Substance and Sexual Activity  . Alcohol use: Yes    Alcohol/week: 0.0 standard drinks    Comment: wine occ.  . Drug use: No  . Sexual activity: Not on file  Other Topics Concern  . Not on file  Social History Narrative  . Not on file   Social Determinants of Health   Financial Resource Strain: Not on file  Food Insecurity: Not on file  Transportation Needs: Not on file  Physical Activity: Not on  file  Stress: Not on file  Social Connections: Not on file     Family History: The patient's family history includes Asthma in her father; Bladder Cancer in her father; Heart disease in her mother; Heart failure in her father; Hyperlipidemia in her brother.  ROS:   Please see the history of present illness.     All other systems reviewed and are negative.  EKGs/Labs/Other Studies Reviewed:    The following studies were reviewed today: Myoview Dec 2020-   The left ventricular ejection fraction is hyperdynamic (>65%).  Nuclear stress EF: 66%.  There was no ST segment deviation noted during stress.  The study is normal.  This is a low risk study.   Normal pharmacologic nuclear stress test with no evidence for prior infarct  or ischemia. Normal LVEF. This is a low risk study.    EKG:  EKG is not ordered today.  The ekg ordered 01/30/2020 demonstrates NSR, HR 59, septal Q  Recent Labs: 01/14/2020: Hemoglobin 13.3; Platelets 213.0  Recent Lipid Panel No results found for: CHOL, TRIG, HDL, CHOLHDL, VLDL, LDLCALC, LDLDIRECT  Physical Exam:    VS:  BP 132/64 (BP Location: Left Arm, Patient Position: Sitting)   Pulse 64   Ht 5' 2.5" (1.588 m)   Wt 148 lb 12.8 oz (67.5 kg)   SpO2 96%   BMI 26.78 kg/m     Wt Readings from Last 3 Encounters:  03/24/20 148 lb 12.8 oz (67.5 kg)  01/30/20 149 lb 9.6 oz (67.9 kg)  01/14/20 150 lb (68 kg)     GEN: Well nourished, female, well developed in no acute distress HEENT: Normal NECK: No JVD; RCA bruit vs transmitted murmur CARDIAC: RRR, 1/6 systolic murmur AOV, no rubs, gallops RESPIRATORY:  Clear to auscultation without rales, wheezing or rhonchi  ABDOMEN: Soft, non-tender, non-distended MUSCULOSKELETAL:  No edema; No deformity  SKIN: Warm and dry NEUROLOGIC:  Alert and oriented x 3 PSYCHIATRIC:  Normal affect   ASSESSMENT:    Palpitations Pt had noted some PM palpitations a few months ago- these have resolved.  Mild  CAD Mild CAD at cath in '08,  Low risk subsequent nuclear stress testing- last done Dec 2020. No chest pain.  Hyperlipidemia LDL goal <70 Dr Shelia Media follows  Multiple sclerosis [facility]) On steroid dose pack  Mild aortic stenosis Echo 2018  Right carotid bruit Asymptomatic- check dopplers  PLAN:    Same cardiac Rx.  We discussed possible triggers for palpitations including steroids.  If she has recurrent symptoms she will need a monitor.    She did have mild carotid disease by doppler in 2013- will recheck dopplers at her convenience.  F/U Dr Claiborne Billings in 6 months.    Medication Adjustments/Labs and Tests Ordered: Current medicines are reviewed at length with the patient today.  Concerns regarding medicines are outlined above.  Orders Placed This Encounter  Procedures  . VAS US CAROTID   No orders of the defined types were placed in this encounter.   Patient Instructions  Medication Instructions:  Continue current medications  *If you need a refill on your cardiac medications before your next appointment, please call your pharmacy*   Lab Work: None Ordered   Testing/Procedures: Your physician has requested that you have a carotid duplex. This test is an ultrasound of the carotid arteries in your neck. It looks at blood flow through these arteries that supply the brain with blood. Allow one hour for this exam. There are no restrictions or special instructions.   Follow-Up: At [facility], you and your health needs are our priority.  As part of our continuing mission to provide you with exceptional heart care, we have created designated Provider Care Teams.  These Care Teams include your primary Cardiologist (physician) and Advanced Practice Providers (APPs -  Physician Assistants and Nurse Practitioners) who all work together to provide you with the care you need, when you need it.  We recommend signing up for the patient portal called "MyChart".  Sign up information is  provided on this After Visit Summary.  MyChart is used to connect with patients for Virtual Visits (Telemedicine).  Patients are able to view lab/test results, encounter notes, upcoming appointments, etc.  Non-urgent messages can be sent to your provider as well.   To  learn more about what you can do with MyChart, go to [email or web address].    Your next appointment:   5 month(s)  The format for your next appointment:   In Person  Provider:   You may see Shelva Majestic, MD or one of the following Advanced Practice Providers on your designated Care Team:    [name], PA-C  Fabian Sharp, PA-C or   Roby Lofts, PA-C        Signed, Kerin Ransom, [place]  03/24/2020 9:21 AM    [place]

## 2020-03-24 NOTE — Assessment & Plan Note (Signed)
Mild CAD at cath in '08,  Low risk subsequent nuclear stress testing- last done Dec 2020. No chest pain.

## 2020-03-30 ENCOUNTER — Other Ambulatory Visit: Payer: Self-pay

## 2020-03-30 ENCOUNTER — Ambulatory Visit (HOSPITAL_COMMUNITY)
Admission: RE | Admit: 2020-03-30 | Discharge: 2020-03-30 | Disposition: A | Discharge status: Home / Self Care | Payer: Medicare PPO | Source: Ambulatory Visit | Attending: Cardiology | Admitting: Cardiology

## 2020-03-30 DIAGNOSIS — R0989 Other specified symptoms and signs involving the circulatory and respiratory systems: Secondary | ICD-10-CM | POA: Insufficient documentation

## 2020-03-31 ENCOUNTER — Ambulatory Visit (INDEPENDENT_AMBULATORY_CARE_PROVIDER_SITE_OTHER): Payer: Medicare PPO

## 2020-03-31 DIAGNOSIS — J4540 Moderate persistent asthma, uncomplicated: Secondary | ICD-10-CM

## 2020-03-31 MED ORDER — DUPILUMAB 200 MG/1.14ML ~~LOC~~ SOSY
200.0000 mg | PREFILLED_SYRINGE | Freq: Once | SUBCUTANEOUS | Status: AC
  Administered 2020-03-31: 200 mg via SUBCUTANEOUS

## 2020-03-31 NOTE — Progress Notes (Signed)
Have you been hospitalized within the last 10 days?  No Do you have a fever?  No Do you have a cough?  No Do you have a headache or sore throat? No Do you have your Epi Pen visible and is it within date?  Yes 

## 2020-04-14 ENCOUNTER — Ambulatory Visit (INDEPENDENT_AMBULATORY_CARE_PROVIDER_SITE_OTHER): Payer: Medicare PPO

## 2020-04-14 ENCOUNTER — Other Ambulatory Visit: Payer: Self-pay

## 2020-04-14 DIAGNOSIS — J4540 Moderate persistent asthma, uncomplicated: Secondary | ICD-10-CM | POA: Diagnosis not present

## 2020-04-14 MED ORDER — DUPILUMAB 200 MG/1.14ML ~~LOC~~ SOSY
200.0000 mg | PREFILLED_SYRINGE | Freq: Once | SUBCUTANEOUS | Status: AC
  Administered 2020-04-14: 200 mg via SUBCUTANEOUS

## 2020-04-14 NOTE — Progress Notes (Signed)
Have you been hospitalized within the last 10 days?  No Do you have a fever?  No Do you have a cough?  No Do you have a headache or sore throat? No Do you have your Epi Pen visible and is it within date?  Yes 

## 2020-04-15 ENCOUNTER — Telehealth: Payer: Self-pay | Admitting: Internal Medicine

## 2020-04-15 NOTE — Telephone Encounter (Signed)
Dupixent Order: 200mg  #2 prefilled syringe Ordered Date: 04/15/20 Expected date of arrival: 04/21/20-pending consent Ordered by: [place]: [facility]

## 2020-04-16 DIAGNOSIS — M7061 Trochanteric bursitis, right hip: Secondary | ICD-10-CM | POA: Diagnosis not present

## 2020-04-16 DIAGNOSIS — M7062 Trochanteric bursitis, left hip: Secondary | ICD-10-CM | POA: Diagnosis not present

## 2020-04-22 NOTE — Telephone Encounter (Signed)
Patient consent needed for [place] shipment.  LM on VM to call [facility].

## 2020-04-28 ENCOUNTER — Ambulatory Visit (INDEPENDENT_AMBULATORY_CARE_PROVIDER_SITE_OTHER): Payer: Medicare PPO

## 2020-04-28 ENCOUNTER — Other Ambulatory Visit: Payer: Self-pay

## 2020-04-28 DIAGNOSIS — J4540 Moderate persistent asthma, uncomplicated: Secondary | ICD-10-CM | POA: Diagnosis not present

## 2020-04-28 MED ORDER — DUPILUMAB 200 MG/1.14ML ~~LOC~~ SOSY
200.0000 mg | PREFILLED_SYRINGE | Freq: Once | SUBCUTANEOUS | Status: AC
  Administered 2020-04-28: 200 mg via SUBCUTANEOUS

## 2020-04-28 NOTE — Progress Notes (Signed)
Have you been hospitalized within the last 10 days?  No Do you have a fever?  No Do you have a cough?  No Do you have a headache or sore throat? No Do you have your Epi Pen visible and is it within date?  Yes 

## 2020-05-04 ENCOUNTER — Telehealth: Payer: Self-pay | Admitting: Internal Medicine

## 2020-05-04 NOTE — Telephone Encounter (Signed)
Dupixent Order: 200mg  #2 prefilled syringe Ordered Date: 05/04/20 Expected date of arrival: 05/07/20 Ordered by: [place]: Lennette Bihari

## 2020-05-07 NOTE — Telephone Encounter (Signed)
Dupixent Shipment Received: 200mg  #2 prefilled syringe Medication arrival date: 05/07/20 Lot #: [id number] Exp date: 09/25/2022 Received OF:BPZW,CHE

## 2020-05-08 ENCOUNTER — Telehealth: Payer: Self-pay | Admitting: Internal Medicine

## 2020-05-08 MED ORDER — PREDNISONE 10 MG PO TABS: ORAL_TABLET | ORAL | 0 refills | Status: AC

## 2020-05-08 NOTE — Telephone Encounter (Signed)
Got a text from patient that she is symptomatic with resp issues. She is asthmatic on biologics. Home covid is negative  Having HA, cough, runny nose since yesterday 05/07/20 early afternoon No known covid exposures Last weekend went to water park with grandkids Has had covid vaccine booster No wheezing  Last prednisone 2 months ago for MS  Plan - triage pleease call  - recommend covid  PCR - isolate till results come back + isolate 7 days if test is positive - Take prednisone 40 mg daily x 2 days, then 20mg  daily x 2 days, then 10mg  daily x 2 days, then 5mg  daily x 2 days and stop (send it to pharmacy and if patient declining she can take it) - rest, hydrate - monitor pulse ox and if <= 88%  Thanks    SIGNATURE    Dr. Brand Males, M.D., F.C.C.P,  Pulmonary and Critical Care Medicine Staff Physician, [place] Director - Interstitial Lung Disease  Program  Pulmonary [place] at [place], [place], [address]  Pager: [phone], If no answer  OR between  19:00-7:00h: page [phone] Telephone (clinical office): [phone] Telephone (research): [phone]  3:40 PM 05/08/2020       Lab Results  Component Value Date   NITRICOXIDE 32 01/14/2020    Asthma Control Test ACT Total Score  01/14/2020 24    Immunization History  Administered Date(s) Administered  . Fluad Quad(high Dose 65+) 11/27/2018, 12/19/2019  . Influenza, High Dose Seasonal PF 12/22/2013, 12/14/2015, 12/14/2016, 11/30/2017  . Influenza,inj,Quad PF,6+ Mos 11/27/2014  . PFIZER(Purple Top)SARS-COV-2 Vaccination 04/16/2019, 05/06/2019  . Pneumococcal Polysaccharide-23 10/26/2013  . Pneumococcal-Unspecified 10/26/2013  . Tdap 06/14/2011  . Zoster Recombinat (Shingrix) 11/13/2017, 02/08/2018

## 2020-05-08 NOTE — Telephone Encounter (Signed)
Call made to patient, confirmed DOB. Made aware of MR recommendations. Confirmed pharmacy. Medications sent in. Number given to scheduled covid test per MR recommendations. Voiced understanding.   Nothing further needed at this time.

## 2020-05-11 ENCOUNTER — Other Ambulatory Visit: Payer: Medicare PPO

## 2020-05-11 DIAGNOSIS — Z20822 Contact with and (suspected) exposure to covid-19: Secondary | ICD-10-CM

## 2020-05-12 ENCOUNTER — Other Ambulatory Visit (INDEPENDENT_AMBULATORY_CARE_PROVIDER_SITE_OTHER): Payer: Medicare PPO

## 2020-05-12 ENCOUNTER — Ambulatory Visit (INDEPENDENT_AMBULATORY_CARE_PROVIDER_SITE_OTHER): Payer: Medicare PPO

## 2020-05-12 ENCOUNTER — Telehealth: Payer: Self-pay | Admitting: Internal Medicine

## 2020-05-12 ENCOUNTER — Ambulatory Visit: Payer: Medicare PPO

## 2020-05-12 DIAGNOSIS — R059 Cough, unspecified: Secondary | ICD-10-CM

## 2020-05-12 LAB — SARS-COV-2, NAA 2 DAY TAT

## 2020-05-12 LAB — CBC WITH DIFFERENTIAL/PLATELET
Basophils Absolute: 0 10*3/uL (ref 0.0–0.1)
Basophils Relative: 0.5 % (ref 0.0–3.0)
Eosinophils Absolute: 0.1 10*3/uL (ref 0.0–0.7)
Eosinophils Relative: 1.9 % (ref 0.0–5.0)
HCT: 40.2 % (ref 36.0–46.0)
Hemoglobin: 13.7 g/dL (ref 12.0–15.0)
Lymphocytes Relative: 23 % (ref 12.0–46.0)
Lymphs Abs: 1.4 10*3/uL (ref 0.7–4.0)
MCHC: 34.1 g/dL (ref 30.0–36.0)
MCV: 94.4 fl (ref 78.0–100.0)
Monocytes Absolute: 0.6 10*3/uL (ref 0.1–1.0)
Monocytes Relative: 10.3 % (ref 3.0–12.0)
Neutro Abs: 4 10*3/uL (ref 1.4–7.7)
Neutrophils Relative %: 64.3 % (ref 43.0–77.0)
Platelets: 195 10*3/uL (ref 150.0–400.0)
RBC: 4.26 Mil/uL (ref 3.87–5.11)
RDW: 14.3 % (ref 11.5–15.5)
WBC: 6.3 10*3/uL (ref 4.0–10.5)

## 2020-05-12 LAB — COMPREHENSIVE METABOLIC PANEL
ALT: 26 U/L (ref 0–35)
AST: 13 U/L (ref 0–37)
Albumin: 4 g/dL (ref 3.5–5.2)
Alkaline Phosphatase: 46 U/L (ref 39–117)
BUN: 16 mg/dL (ref 6–23)
CO2: 23 mEq/L (ref 19–32)
Calcium: 9 mg/dL (ref 8.4–10.5)
Chloride: 107 mEq/L (ref 96–112)
Creatinine, Ser: 0.75 mg/dL (ref 0.40–1.20)
GFR: 79.05 mL/min (ref >60.00)
Glucose, Bld: 119 mg/dL — ABNORMAL HIGH (ref 70–99)
Potassium: 3.9 mEq/L (ref 3.5–5.1)
Sodium: 137 mEq/L (ref 135–145)
Total Bilirubin: 0.4 mg/dL (ref 0.2–1.2)
Total Protein: 6.6 g/dL (ref 6.0–8.3)

## 2020-05-12 LAB — HEPATIC FUNCTION PANEL
ALT: 26 U/L (ref 0–35)
AST: 13 U/L (ref 0–37)
Albumin: 4 g/dL (ref 3.5–5.2)
Alkaline Phosphatase: 46 U/L (ref 39–117)
Bilirubin, Direct: 0 mg/dL (ref 0.0–0.3)
Total Bilirubin: 0.4 mg/dL (ref 0.2–1.2)
Total Protein: 6.6 g/dL (ref 6.0–8.3)

## 2020-05-12 LAB — D-DIMER, QUANTITATIVE: D-Dimer, Quant: 0.22 mcg/mL FEU (ref <0.50)

## 2020-05-12 LAB — NOVEL CORONAVIRUS, NAA: SARS-CoV-2, NAA: NOT DETECTED

## 2020-05-12 MED ORDER — DOXYCYCLINE HYCLATE 100 MG PO TABS: 100.0000 mg | ORAL_TABLET | Freq: Two times a day (BID) | ORAL | 0 refills | Status: DC

## 2020-05-12 NOTE — Telephone Encounter (Signed)
Sorry to hear this  She should come into the lab and do a chest x-ray two-view and also CBC with differential, chemistry panel, liver function test and D-dimer  She should take the prednisone that was prescribed  Take doxycycline 100mg  po twice daily x 5 days; take after meals and avoid sunlight   Definitely hold off on Dupixent tomorrow     Component Ref Range & Units 1 d ago 1 yr ago  SARS-CoV-2, NAA Not Detected Not Detected  Not Detected         Allergies  Allergen Reactions  . Crestor [Rosuvastatin] Other (See Comments)    Joint pain   . Iodinated Diagnostic Agents Other (See Comments)    Sneezing and itchy throat; dye was Isovue 300  . Vicodin [Hydrocodone-Acetaminophen]     Made pass-out one time and is okay taking now  . Isovue [Iopamidol]     Pt had sneezing and itching of her throat and soft palate.  Dr Alvester Chou checked pt.  She will need premeds in the future.  J Bohm    Current Outpatient Medications:  .  albuterol (PROAIR HFA) 108 (90 Base) MCG/ACT inhaler, INHALE 1-2 PUFFS INTO THE LUNGS EVERY SIX HOURS AS NEEDED FOR WHEEZING OR SHORTNESS OF BREATH., Disp: 8.5 g, Rfl: 2 .  Cholecalciferol (VITAMIN D PO), Take 1 tablet by mouth daily., Disp: , Rfl:  .  diazepam (VALIUM) 2 MG tablet, Take 2 mg by mouth every 6 (six) hours as needed for anxiety., Disp: , Rfl:  .  diltiazem (CARDIZEM CD) 240 MG 24 hr capsule, Take 240 mg by mouth daily. Take 1 capsule by mouth daily, Disp: , Rfl:  .  dupilumab (DUPIXENT) 200 MG/1.14ML prefilled syringe, Inject 200 mg into the skin every 14 (fourteen) days., Disp: 2 mL, Rfl: 5 .  EPINEPHRINE 0.3 mg/0.3 mL IJ SOAJ injection, USE AS DIRECTED, Disp: 2 each, Rfl: 3 .  gabapentin (NEURONTIN) 300 MG capsule, Take 300 mg by mouth daily as needed. , Disp: , Rfl:  .  ipratropium-albuterol (DUONEB) 0.5-2.5 (3) MG/3ML SOLN, Take 3 mLs by nebulization 2 (two) times daily. (Patient taking differently: Take 3 mLs by nebulization 2 (two) times daily.  Pt takes as needed), Disp: 360 mL, Rfl: 5 .  isosorbide mononitrate (IMDUR) 60 MG 24 hr tablet, Take 1.5 tablets (90 mg total) by mouth daily., Disp: 135 tablet, Rfl: 3 .  losartan (COZAAR) 50 MG tablet, TAKE 1 & 1/2 TABLETS ONCE DAILY., Disp: 135 tablet, Rfl: 0 .  meclizine (ANTIVERT) 25 MG tablet, Take 1 tablet (25 mg total) by mouth 3 (three) times daily as needed for dizziness., Disp: 30 tablet, Rfl: 0 .  nitroGLYCERIN (NITROSTAT) 0.4 MG SL tablet, PLACE 1 TAB UNDER THE TONGUE AS NEEDED FOR CHEST PAIN MAY REPEAT EVERY 5 MINUTES., Disp: 25 tablet, Rfl: 3 .  pantoprazole (PROTONIX) 20 MG tablet, TAKE 1 TABLET BY MOUTH TWICE DAILY. (Patient taking differently: Pt takes once daily), Disp: 60 tablet, Rfl: 3 .  PARoxetine (PAXIL) 10 MG tablet, Take 5 mg by mouth daily. , Disp: , Rfl:  .  predniSONE (DELTASONE) 10 MG tablet, Take 4 tablets (40 mg total) by mouth daily with breakfast for 2 days, THEN 2 tablets (20 mg total) daily with breakfast for 2 days, THEN 1 tablet (10 mg total) daily with breakfast for 2 days, THEN 0.5 tablets (5 mg total) daily with breakfast for 2 days., Disp: 20 tablet, Rfl: 0 .  Respiratory Therapy Supplies (FLUTTER) DEVI,  Use as directed, Disp: 1 each, Rfl: 0 .  VYTORIN 10-20 MG per tablet, Take 1 tablet by mouth daily. , Disp: , Rfl:   Current Facility-Administered Medications:  .  dupilumab (DUPIXENT) prefilled syringe 200 mg, 200 mg, Subcutaneous, Once, Mannam, Praveen, MD .  Mepolizumab SOLR 100 mg, 100 mg, Subcutaneous, Q28 days, Brand Males, MD, 100 mg at 06/14/17 0917 .  Mepolizumab SOLR 100 mg, 100 mg, Subcutaneous, Q28 days, Brand Males, MD, 100 mg at 07/12/17 0902 .  Mepolizumab SOLR 100 mg, 100 mg, Subcutaneous, Q28 days, Brand Males, MD, 100 mg at 10/31/17 [phone]

## 2020-05-12 NOTE — Telephone Encounter (Signed)
Called and spoke with pt who states that she is still coughing. States cough is better than when she messaged MR 2/11 but she is still coughing and now the phlegm is yellow in color and  Has had some blood mixed in with it.  Pt stated she has coughed up some blood twice the first time being about a teaspoon amount which was bright red in color and the second time about an hour after the first time but it was not as much as the first time.  Pt stated that she did pick up the prednisone Rx that was prescribed but has not had to take it yet as she has had no wheezing.  Pt did get covid tested which came back negative.  Pt is requesting to have an abx sent to pharmacy due to the discolored phlegm she is having now.  Pt is also scheduled to receive her dupixent injection today 2/15 at 1:45 and wants to know if it is still okay for her to come.  MR, please advise on all this for pt.

## 2020-05-12 NOTE — Telephone Encounter (Signed)
Called and spoke with pt and she is aware of MR recs.  appt for dupixent has been cancelled and pt is aware.  She will come in for cxr and labs today.  She is aware of meds sent to the pharmacy.

## 2020-05-19 ENCOUNTER — Telehealth: Payer: Self-pay | Admitting: Internal Medicine

## 2020-05-19 NOTE — Telephone Encounter (Signed)
Patient scheduled 05/22/20 at 1430 for Duypixent injection.

## 2020-05-22 ENCOUNTER — Other Ambulatory Visit: Payer: Self-pay

## 2020-05-22 ENCOUNTER — Ambulatory Visit (INDEPENDENT_AMBULATORY_CARE_PROVIDER_SITE_OTHER): Payer: Medicare PPO

## 2020-05-22 DIAGNOSIS — J4540 Moderate persistent asthma, uncomplicated: Secondary | ICD-10-CM | POA: Diagnosis not present

## 2020-05-22 MED ORDER — DUPILUMAB 200 MG/1.14ML ~~LOC~~ SOSY
200.0000 mg | PREFILLED_SYRINGE | Freq: Once | SUBCUTANEOUS | Status: AC
  Administered 2020-05-22: 200 mg via SUBCUTANEOUS

## 2020-05-22 NOTE — Progress Notes (Signed)
Have you been hospitalized within the last 10 days?  No Do you have a fever?  No Do you have a cough?  No Do you have a headache or sore throat? No Do you have your Epi Pen visible and is it within date?  Yes 

## 2020-05-28 DIAGNOSIS — M7061 Trochanteric bursitis, right hip: Secondary | ICD-10-CM | POA: Diagnosis not present

## 2020-05-28 DIAGNOSIS — M810 Age-related osteoporosis without current pathological fracture: Secondary | ICD-10-CM | POA: Diagnosis not present

## 2020-05-28 DIAGNOSIS — M7062 Trochanteric bursitis, left hip: Secondary | ICD-10-CM | POA: Diagnosis not present

## 2020-06-03 ENCOUNTER — Other Ambulatory Visit (HOSPITAL_COMMUNITY): Payer: Self-pay | Admitting: Pharmacy Technician

## 2020-06-03 DIAGNOSIS — B079 Viral wart, unspecified: Secondary | ICD-10-CM | POA: Diagnosis not present

## 2020-06-03 DIAGNOSIS — D485 Neoplasm of uncertain behavior of skin: Secondary | ICD-10-CM | POA: Diagnosis not present

## 2020-06-03 DIAGNOSIS — J4540 Moderate persistent asthma, uncomplicated: Secondary | ICD-10-CM | POA: Insufficient documentation

## 2020-06-08 ENCOUNTER — Other Ambulatory Visit: Payer: Self-pay

## 2020-06-08 ENCOUNTER — Ambulatory Visit (INDEPENDENT_AMBULATORY_CARE_PROVIDER_SITE_OTHER): Payer: Medicare PPO | Admitting: *Deleted

## 2020-06-08 VITALS — BP 155/78 | HR 84 | Temp 97.8°F

## 2020-06-08 DIAGNOSIS — J441 Chronic obstructive pulmonary disease with (acute) exacerbation: Secondary | ICD-10-CM

## 2020-06-08 DIAGNOSIS — J4540 Moderate persistent asthma, uncomplicated: Secondary | ICD-10-CM | POA: Diagnosis not present

## 2020-06-08 MED ORDER — METHYLPREDNISOLONE SODIUM SUCC 125 MG IJ SOLR: 125.0000 mg | Freq: Once | INTRAMUSCULAR | Status: DC | PRN

## 2020-06-08 MED ORDER — DIPHENHYDRAMINE HCL 50 MG/ML IJ SOLN: 50.0000 mg | Freq: Once | INTRAMUSCULAR | Status: DC | PRN

## 2020-06-08 MED ORDER — DUPILUMAB 200 MG/1.14ML ~~LOC~~ SOSY
200.0000 mg | PREFILLED_SYRINGE | Freq: Once | SUBCUTANEOUS | Status: AC
  Administered 2020-06-08: 200 mg via SUBCUTANEOUS
  Filled 2020-06-08: qty 2.28

## 2020-06-08 MED ORDER — FAMOTIDINE IN NACL 20-0.9 MG/50ML-% IV SOLN: 20.0000 mg | Freq: Once | INTRAVENOUS | Status: DC | PRN

## 2020-06-08 MED ORDER — ALBUTEROL SULFATE HFA 108 (90 BASE) MCG/ACT IN AERS: 2.0000 | INHALATION_SPRAY | Freq: Once | RESPIRATORY_TRACT | Status: DC | PRN

## 2020-06-08 MED ORDER — EPINEPHRINE 0.3 MG/0.3ML IJ SOAJ: 0.3000 mg | Freq: Once | INTRAMUSCULAR | Status: DC | PRN

## 2020-06-08 MED ORDER — SODIUM CHLORIDE 0.9 % IV SOLN: Freq: Once | INTRAVENOUS | Status: DC | PRN

## 2020-06-08 NOTE — Addendum Note (Signed)
Addended by: Jonelle Sidle E on: 06/08/2020 04:50 PM   Modules accepted: Orders

## 2020-06-08 NOTE — Progress Notes (Signed)
Initial documentation was against order from 10/2019.  Corrected to 06/08/20

## 2020-06-10 NOTE — Progress Notes (Addendum)
Diagnosis: Asthma  Provider:  Praveen Mannam, MD  Procedure: Injection  Dupixent (Dupilumab), Dose: 200 mg, Site: subcutaneous  Discharge: Condition: Good, Destination: Home . AVS provided to patient.   Performed by:  Baer Hinton, RN        

## 2020-06-17 DIAGNOSIS — K639 Disease of intestine, unspecified: Secondary | ICD-10-CM | POA: Diagnosis not present

## 2020-06-17 DIAGNOSIS — R1011 Right upper quadrant pain: Secondary | ICD-10-CM | POA: Diagnosis not present

## 2020-06-17 DIAGNOSIS — K589 Irritable bowel syndrome without diarrhea: Secondary | ICD-10-CM | POA: Diagnosis not present

## 2020-06-22 ENCOUNTER — Ambulatory Visit (INDEPENDENT_AMBULATORY_CARE_PROVIDER_SITE_OTHER): Payer: Medicare PPO

## 2020-06-22 ENCOUNTER — Other Ambulatory Visit: Payer: Self-pay

## 2020-06-22 VITALS — BP 133/74 | HR 75 | Temp 98.0°F | Resp 16

## 2020-06-22 DIAGNOSIS — J4540 Moderate persistent asthma, uncomplicated: Secondary | ICD-10-CM | POA: Diagnosis not present

## 2020-06-22 MED ORDER — EPINEPHRINE 0.3 MG/0.3ML IJ SOAJ: 0.3000 mg | Freq: Once | INTRAMUSCULAR | Status: DC | PRN

## 2020-06-22 MED ORDER — METHYLPREDNISOLONE SODIUM SUCC 125 MG IJ SOLR: 125.0000 mg | Freq: Once | INTRAMUSCULAR | Status: DC | PRN

## 2020-06-22 MED ORDER — ALBUTEROL SULFATE HFA 108 (90 BASE) MCG/ACT IN AERS: 2.0000 | INHALATION_SPRAY | Freq: Once | RESPIRATORY_TRACT | Status: DC | PRN

## 2020-06-22 MED ORDER — SODIUM CHLORIDE 0.9 % IV SOLN: Freq: Once | INTRAVENOUS | Status: DC | PRN

## 2020-06-22 MED ORDER — DUPILUMAB 200 MG/1.14ML ~~LOC~~ SOSY
200.0000 mg | PREFILLED_SYRINGE | Freq: Once | SUBCUTANEOUS | Status: AC
  Administered 2020-06-22: 200 mg via SUBCUTANEOUS
  Filled 2020-06-22: qty 2.28

## 2020-06-22 MED ORDER — DIPHENHYDRAMINE HCL 50 MG/ML IJ SOLN: 50.0000 mg | Freq: Once | INTRAMUSCULAR | Status: DC | PRN

## 2020-06-22 MED ORDER — FAMOTIDINE IN NACL 20-0.9 MG/50ML-% IV SOLN: 20.0000 mg | Freq: Once | INTRAVENOUS | Status: DC | PRN

## 2020-06-22 NOTE — Progress Notes (Signed)
Diagnosis: Asthma  Provider: Dr. Marshell Garfinkel   Procedure: Injection, Medication: Dupixent 200mg , Site: subcutaneous left arm by Koren Shiver, RN  Discharge: Condition: Good, Destination: Home . AVS provided. by Koren Shiver, RN   Pt tolerated injection well. No bleeding, redness, or swelling following injection. Pt aware to call clinic should any adverse evens develop.

## 2020-06-23 ENCOUNTER — Other Ambulatory Visit: Payer: Self-pay | Admitting: Physician Assistant

## 2020-06-23 DIAGNOSIS — R0781 Pleurodynia: Secondary | ICD-10-CM | POA: Diagnosis not present

## 2020-06-23 DIAGNOSIS — R1011 Right upper quadrant pain: Secondary | ICD-10-CM

## 2020-06-26 DIAGNOSIS — M7061 Trochanteric bursitis, right hip: Secondary | ICD-10-CM | POA: Diagnosis not present

## 2020-06-26 DIAGNOSIS — M7062 Trochanteric bursitis, left hip: Secondary | ICD-10-CM | POA: Diagnosis not present

## 2020-06-29 NOTE — Progress Notes (Signed)
Diagnosis: Asthma  Provider:  Marshell Garfinkel, MD  Procedure: Injection  Dupixent (Dupilumab), Dose: 200 mg, Site: subcutaneous  Discharge: Condition: Good, Destination: Home . AVS provided to patient.   Performed by:  Jonelle Sidle, RN

## 2020-07-06 ENCOUNTER — Ambulatory Visit (INDEPENDENT_AMBULATORY_CARE_PROVIDER_SITE_OTHER): Payer: Medicare PPO

## 2020-07-06 ENCOUNTER — Other Ambulatory Visit: Payer: Self-pay

## 2020-07-06 VITALS — BP 123/67 | HR 82 | Temp 97.9°F | Resp 18

## 2020-07-06 DIAGNOSIS — J4540 Moderate persistent asthma, uncomplicated: Secondary | ICD-10-CM | POA: Diagnosis not present

## 2020-07-06 MED ORDER — DIPHENHYDRAMINE HCL 50 MG/ML IJ SOLN: 50.0000 mg | Freq: Once | INTRAMUSCULAR | Status: DC | PRN

## 2020-07-06 MED ORDER — EPINEPHRINE 0.3 MG/0.3ML IJ SOAJ: 0.3000 mg | Freq: Once | INTRAMUSCULAR | Status: DC | PRN

## 2020-07-06 MED ORDER — FAMOTIDINE IN NACL 20-0.9 MG/50ML-% IV SOLN: 20.0000 mg | Freq: Once | INTRAVENOUS | Status: DC | PRN

## 2020-07-06 MED ORDER — DUPILUMAB 200 MG/1.14ML ~~LOC~~ SOSY
200.0000 mg | PREFILLED_SYRINGE | Freq: Once | SUBCUTANEOUS | Status: AC
  Administered 2020-07-06: 200 mg via SUBCUTANEOUS
  Filled 2020-07-06: qty 2.28

## 2020-07-06 MED ORDER — SODIUM CHLORIDE 0.9 % IV SOLN: Freq: Once | INTRAVENOUS | Status: DC | PRN

## 2020-07-06 MED ORDER — ALBUTEROL SULFATE HFA 108 (90 BASE) MCG/ACT IN AERS
2.0000 | INHALATION_SPRAY | Freq: Once | RESPIRATORY_TRACT | Status: DC | PRN
Start: 2020-07-06 — End: 2020-07-06

## 2020-07-06 MED ORDER — METHYLPREDNISOLONE SODIUM SUCC 125 MG IJ SOLR: 125.0000 mg | Freq: Once | INTRAMUSCULAR | Status: DC | PRN

## 2020-07-06 NOTE — Progress Notes (Signed)
Diagnosis: Asthma  Provider:  Marshell Garfinkel, MD  Procedure: Injection  Dupixent (Dupilumab), Dose: 200 mg, Site: subcutaneous  Discharge: Condition: Good, Destination: Home .Patient declined AVS.   Performed by:  Jonelle Sidle, RN

## 2020-07-07 DIAGNOSIS — R0781 Pleurodynia: Secondary | ICD-10-CM | POA: Diagnosis not present

## 2020-07-13 ENCOUNTER — Ambulatory Visit
Admission: RE | Admit: 2020-07-13 | Discharge: 2020-07-13 | Disposition: A | Discharge status: Home / Self Care | Payer: Medicare PPO | Source: Ambulatory Visit | Attending: Physician Assistant | Admitting: Physician Assistant

## 2020-07-13 ENCOUNTER — Other Ambulatory Visit: Payer: Self-pay

## 2020-07-13 DIAGNOSIS — R1011 Right upper quadrant pain: Secondary | ICD-10-CM

## 2020-07-13 DIAGNOSIS — K7689 Other specified diseases of liver: Secondary | ICD-10-CM | POA: Diagnosis not present

## 2020-07-17 DIAGNOSIS — R1011 Right upper quadrant pain: Secondary | ICD-10-CM | POA: Diagnosis not present

## 2020-07-17 DIAGNOSIS — K639 Disease of intestine, unspecified: Secondary | ICD-10-CM | POA: Diagnosis not present

## 2020-07-20 ENCOUNTER — Ambulatory Visit: Payer: Medicare PPO

## 2020-07-20 DIAGNOSIS — M7062 Trochanteric bursitis, left hip: Secondary | ICD-10-CM | POA: Diagnosis not present

## 2020-07-20 DIAGNOSIS — M7061 Trochanteric bursitis, right hip: Secondary | ICD-10-CM | POA: Diagnosis not present

## 2020-07-21 ENCOUNTER — Ambulatory Visit (INDEPENDENT_AMBULATORY_CARE_PROVIDER_SITE_OTHER): Payer: Medicare PPO

## 2020-07-21 ENCOUNTER — Other Ambulatory Visit: Payer: Self-pay

## 2020-07-21 VITALS — BP 151/71 | HR 75 | Temp 98.3°F | Resp 24

## 2020-07-21 DIAGNOSIS — J4540 Moderate persistent asthma, uncomplicated: Secondary | ICD-10-CM | POA: Diagnosis not present

## 2020-07-21 MED ORDER — DIPHENHYDRAMINE HCL 50 MG/ML IJ SOLN: 50.0000 mg | Freq: Once | INTRAMUSCULAR | Status: DC | PRN

## 2020-07-21 MED ORDER — FAMOTIDINE IN NACL 20-0.9 MG/50ML-% IV SOLN: 20.0000 mg | Freq: Once | INTRAVENOUS | Status: DC | PRN

## 2020-07-21 MED ORDER — EPINEPHRINE 0.3 MG/0.3ML IJ SOAJ: 0.3000 mg | Freq: Once | INTRAMUSCULAR | Status: DC | PRN

## 2020-07-21 MED ORDER — ALBUTEROL SULFATE HFA 108 (90 BASE) MCG/ACT IN AERS: 2.0000 | INHALATION_SPRAY | Freq: Once | RESPIRATORY_TRACT | Status: DC | PRN

## 2020-07-21 MED ORDER — DUPILUMAB 200 MG/1.14ML ~~LOC~~ SOSY
200.0000 mg | PREFILLED_SYRINGE | Freq: Once | SUBCUTANEOUS | Status: AC
  Administered 2020-07-21: 200 mg via SUBCUTANEOUS
  Filled 2020-07-21: qty 2.28

## 2020-07-21 MED ORDER — METHYLPREDNISOLONE SODIUM SUCC 125 MG IJ SOLR: 125.0000 mg | Freq: Once | INTRAMUSCULAR | Status: DC | PRN

## 2020-07-21 MED ORDER — SODIUM CHLORIDE 0.9 % IV SOLN
Freq: Once | INTRAVENOUS | Status: DC | PRN
Start: 2020-07-21 — End: 2020-07-21

## 2020-07-21 NOTE — Progress Notes (Signed)
Diagnosis: Asthma  Provider:  Praveen Mannam, MD  Procedure: Injection  Dupixent (Dupilumab), Dose: 300 mg, Site: subcutaneous, right arm  Discharge: Condition: Good, Destination: Home . AVS provided to patient.   Performed by:  Shamere Campas, RN        

## 2020-07-22 ENCOUNTER — Other Ambulatory Visit: Payer: Self-pay | Admitting: Cardiovascular Disease

## 2020-07-22 DIAGNOSIS — M7061 Trochanteric bursitis, right hip: Secondary | ICD-10-CM | POA: Diagnosis not present

## 2020-07-22 DIAGNOSIS — M7062 Trochanteric bursitis, left hip: Secondary | ICD-10-CM | POA: Diagnosis not present

## 2020-07-27 DIAGNOSIS — M7061 Trochanteric bursitis, right hip: Secondary | ICD-10-CM | POA: Diagnosis not present

## 2020-07-27 DIAGNOSIS — M7062 Trochanteric bursitis, left hip: Secondary | ICD-10-CM | POA: Diagnosis not present

## 2020-08-03 ENCOUNTER — Ambulatory Visit: Payer: Medicare PPO

## 2020-08-03 ENCOUNTER — Other Ambulatory Visit: Payer: Self-pay

## 2020-08-03 ENCOUNTER — Encounter (HOSPITAL_COMMUNITY): Payer: Self-pay

## 2020-08-03 ENCOUNTER — Emergency Department (HOSPITAL_COMMUNITY): Payer: Medicare PPO

## 2020-08-03 ENCOUNTER — Emergency Department (HOSPITAL_COMMUNITY)
Admission: EM | Admit: 2020-08-03 | Discharge: 2020-08-03 | Disposition: A | Discharge status: Home / Self Care | Payer: Medicare PPO | Attending: Emergency Medicine | Admitting: Emergency Medicine

## 2020-08-03 DIAGNOSIS — I2510 Atherosclerotic heart disease of native coronary artery without angina pectoris: Secondary | ICD-10-CM | POA: Insufficient documentation

## 2020-08-03 DIAGNOSIS — B9789 Other viral agents as the cause of diseases classified elsewhere: Secondary | ICD-10-CM | POA: Diagnosis not present

## 2020-08-03 DIAGNOSIS — J069 Acute upper respiratory infection, unspecified: Secondary | ICD-10-CM | POA: Diagnosis not present

## 2020-08-03 DIAGNOSIS — R21 Rash and other nonspecific skin eruption: Secondary | ICD-10-CM | POA: Diagnosis not present

## 2020-08-03 DIAGNOSIS — R059 Cough, unspecified: Secondary | ICD-10-CM | POA: Diagnosis not present

## 2020-08-03 DIAGNOSIS — J029 Acute pharyngitis, unspecified: Secondary | ICD-10-CM | POA: Diagnosis not present

## 2020-08-03 DIAGNOSIS — R233 Spontaneous ecchymoses: Secondary | ICD-10-CM | POA: Diagnosis not present

## 2020-08-03 LAB — PROTIME-INR
INR: 0.9 (ref 0.8–1.2)
Prothrombin Time: 12.3 seconds (ref 11.4–15.2)

## 2020-08-03 LAB — CBC WITH DIFFERENTIAL/PLATELET
Abs Immature Granulocytes: 0.01 10*3/uL (ref 0.00–0.07)
Basophils Absolute: 0 10*3/uL (ref 0.0–0.1)
Basophils Relative: 1 %
Eosinophils Absolute: 0.2 10*3/uL (ref 0.0–0.5)
Eosinophils Relative: 2 %
HCT: 42.3 % (ref 36.0–46.0)
Hemoglobin: 13.8 g/dL (ref 12.0–15.0)
Immature Granulocytes: 0 %
Lymphocytes Relative: 34 %
Lymphs Abs: 2.4 10*3/uL (ref 0.7–4.0)
MCH: 31.6 pg (ref 26.0–34.0)
MCHC: 32.6 g/dL (ref 30.0–36.0)
MCV: 96.8 fL (ref 80.0–100.0)
Monocytes Absolute: 0.6 10*3/uL (ref 0.1–1.0)
Monocytes Relative: 9 %
Neutro Abs: 4 10*3/uL (ref 1.7–7.7)
Neutrophils Relative %: 54 %
Platelets: 245 10*3/uL (ref 150–400)
RBC: 4.37 MIL/uL (ref 3.87–5.11)
RDW: 12.6 % (ref 11.5–15.5)
WBC: 7.3 10*3/uL (ref 4.0–10.5)
nRBC: 0 % (ref 0.0–0.2)

## 2020-08-03 LAB — COMPREHENSIVE METABOLIC PANEL
ALT: 27 U/L (ref 0–44)
AST: 19 U/L (ref 15–41)
Albumin: 4.4 g/dL (ref 3.5–5.0)
Alkaline Phosphatase: 46 U/L (ref 38–126)
Anion gap: 7 (ref 5–15)
BUN: 14 mg/dL (ref 8–23)
CO2: 26 mmol/L (ref 22–32)
Calcium: 9.8 mg/dL (ref 8.9–10.3)
Chloride: 108 mmol/L (ref 98–111)
Creatinine, Ser: 0.76 mg/dL (ref 0.44–1.00)
GFR, Estimated: >60 mL/min (ref >60)
Glucose, Bld: 103 mg/dL — ABNORMAL HIGH (ref 70–99)
Potassium: 3.9 mmol/L (ref 3.5–5.1)
Sodium: 141 mmol/L (ref 135–145)
Total Bilirubin: 0.4 mg/dL (ref 0.3–1.2)
Total Protein: 7.1 g/dL (ref 6.5–8.1)

## 2020-08-03 LAB — APTT: aPTT: 26 seconds (ref 24–36)

## 2020-08-03 MED ORDER — DOXYCYCLINE HYCLATE 100 MG PO CAPS: 100.0000 mg | ORAL_CAPSULE | Freq: Two times a day (BID) | ORAL | 0 refills | Status: DC

## 2020-08-03 NOTE — Discharge Instructions (Signed)
All your blood work today looks normal.  Platelet counts and white blood cell counts are normal.  We will use doxycycline in case this is a tick related illness.  This would also treat sinusitis.  If you developing worsening symptoms.  The rash becomes much more severe all over your body.

## 2020-08-03 NOTE — ED Provider Notes (Signed)
[name]   CSN: [id number] Arrival date & time: 08/03/20  1452     History Chief Complaint  Patient presents with  . Headache  . Cough  . Rash    [name] is a 75 y.o. female.  Patient is a 75 year old female with a history of asthma, mild aortic stenosis, MS, CAD who is presenting today from Dr. Pennie Banter office because of a rash on her lower extremities.  Patient reports that 1 week ago she got URI symptoms including minimal cough, nasal congestion and intermittent headache.  She did test 3 times and all COVID tests were negative.  She reports by Friday she was feeling better and symptoms had mostly resolved.  Saturday she woke up and she was not feeling as well.  She had some aching in her legs had started to notice a rash develop and had a mild dry cough.  This is persisted until today.  She felt that yesterday was a little bit better but the rash seems to be slightly worsening.  She went to her doctor's office today and they recommended she come here for further evaluation.  She has had no known tick exposure.  She does garden outside but she lives in town and is not in a heavily wooded area.  She denies any neck pain.  She has had mild intermittent headaches which have seemed to be in the frontal part of her head.  No visual changes, no dizziness.  She does not take anticoagulation.  No history of similar rash.  She has not noticed any joint swelling.  She has not checked her temperature but is unaware of having a fever.  The history is provided by the patient.  Headache Associated symptoms: cough   Cough Associated symptoms: headaches and rash   Rash Associated symptoms: headaches        Past Medical History:  Diagnosis Date  . Anemia    past history-many yrs ago  . Anginal pain ([place])    being evaluated by Dr. Tyrone Sage, arm pain,"bad indigestion" -no heart related findings as of yet  . Arthritis    hip. back pain   . Complication of anesthesia    s/p Hysterectomy "vagal response "heart stopped" -did not require shocking.  . Coronary artery disease   . Dry eyes   . Esophageal spasm   . GERD (gastroesophageal reflux disease)   . MS (multiple sclerosis) ([place])    stable-sees [name] every 6 months    Patient Active Problem List   Diagnosis Date Noted  . Moderate persistent asthma 06/03/2020  . Palpitations 03/24/2020  . Mild aortic stenosis 03/24/2020  . Right carotid bruit 03/24/2020  . Simple chronic bronchitis ([place]) 04/09/2018  . Oral thrush 04/09/2018  . Low immunoglobulin level 08/23/2017  . Asthma 10/07/2016  . Tightness in chest 06/01/2015  . Nodule of left lung 06/01/2015  . Pain in the chest   . Leukocytosis 04/25/2015  . Hyperlipidemia LDL goal <70 02/25/2014  . Peripheral neuropathy 02/25/2014  . GERD (gastroesophageal reflux disease) 02/25/2014  . Mild CAD 08/29/2013  . Chest pain 08/29/2013  . Multiple sclerosis ([place]) 08/29/2013  . Sinus tarsi syndrome of right ankle 08/01/2013  . Pes cavus 08/01/2013  . Syncope and collapse 07/02/2011  . Abdominal pain 07/02/2011  . Nausea & vomiting 07/02/2011  . Ovarian cyst 07/02/2011  . Weakness generalized 07/02/2011  . Diarrhea 07/02/2011    Past Surgical History:  Procedure Laterality Date  .  ABDOMINAL HYSTERECTOMY    . BLEPHAROPLASTY Bilateral   . CARDIAC CATHETERIZATION  10/04/06   MINOR CAD,SINGLE VESSEL INVOLVING THE CIRCUMFLEX. 20 TO 30% PROXIMALLY AND 10 TO 20% IN THE MIDDLE SEGMENT.MILD MUSCLE BRIDGING, MID LAD.NORMAL RCA.NORMAL LV FUNCTION.NORMAL MITRAL AND AORTIC VALVE.NORMAL APPEARING AORTA,THORACIC AND ABDOMINAL.NORMAL RENAL ARTERIES.  [name] CARDIOLOGY NUCLEAR MED STUDY  06/22/12   NL LV FUNCTION,EF 68%,NL WALL MOTION.  [name] CAROTID DUPLEX  07/02/11   QQP:YPPJ SOFT PLAQUE NOTED DISTAL CCA AND ORGIN AND PROXIMAL ICA,LEFT>RIGHT.NO ICA STENOSIS. VERTEBRAL ARTERY FLOW IS ANTEGRADE.  [name] CESAREAN SECTION     x2   . COLONOSCOPY WITH  PROPOFOL N/A 12/11/2014   Procedure: COLONOSCOPY WITH PROPOFOL;  Surgeon: Ronald Lobo, MD;  Location: [facility];  Service: Endoscopy;  Laterality: N/A;  . KNEE ARTHROSCOPY Left    scope  . PARATHYROIDECTOMY     partial-many years ago  . thumb surgery Bilateral    built up and bone removal  . TONSILLECTOMY    . TRANSTHORACIC ECHOCARDIOGRAM  07/02/11   LV CAVITY SIZE IS NORMAL. SYSTOLIC FUNCTION WAS NORMAL.EF=55% TO 60%.INCREASED RELATIVE CONTRIBUTION OF ATRIAL CONTRACTION TO VENTRICULAR FILLING;MAYBE DUE TO HYPOVOLEMIA. AV=MILD REGURG.     OB History   No obstetric history on file.     Family History  Problem Relation Age of Onset  . Heart disease Mother   . Asthma Father   . Bladder Cancer Father   . Heart failure Father   . Hyperlipidemia Brother     Social History   Tobacco Use  . Smoking status: Never Smoker  . Smokeless tobacco: Never Used  Vaping Use  . Vaping Use: Never used  Substance Use Topics  . Alcohol use: Yes    Alcohol/week: 0.0 standard drinks    Comment: wine occ.  . Drug use: No    Home Medications Prior to Admission medications   Medication Sig Start Date End Date Taking? Authorizing Provider  albuterol (PROAIR HFA) 108 (90 Base) MCG/ACT inhaler INHALE 1-2 PUFFS INTO THE LUNGS EVERY SIX HOURS AS NEEDED FOR WHEEZING OR SHORTNESS OF BREATH. 01/14/20   Martyn Ehrich, NP  Cholecalciferol (VITAMIN D PO) Take 1 tablet by mouth daily.    [provider]  diazepam (VALIUM) 2 MG tablet Take 2 mg by mouth every 6 (six) hours as needed for anxiety.    [provider]  diltiazem (CARDIZEM CD) 240 MG 24 hr capsule Take 240 mg by mouth daily. Take 1 capsule by mouth daily    [provider]  doxycycline (VIBRA-TABS) 100 MG tablet Take 1 tablet (100 mg total) by mouth 2 (two) times daily. 05/12/20   Brand Males, MD  dupilumab (DUPIXENT) 200 MG/1.14ML prefilled syringe Inject 200 mg into the skin every 14 (fourteen) days.  12/23/19   Brand Males, MD  EPINEPHRINE 0.3 mg/0.3 mL IJ SOAJ injection USE AS DIRECTED 01/20/20   Brand Males, MD  gabapentin (NEURONTIN) 300 MG capsule Take 300 mg by mouth daily as needed.     [provider]  ipratropium-albuterol (DUONEB) 0.5-2.5 (3) MG/3ML SOLN Take 3 mLs by nebulization 2 (two) times daily. Patient taking differently: Take 3 mLs by nebulization 2 (two) times daily. Pt takes as needed 01/16/20   Brand Males, MD  isosorbide mononitrate (IMDUR) 60 MG 24 hr tablet Take 1.5 tablets (90 mg total) by mouth daily. 09/26/19   [name], PA  losartan (COZAAR) 50 MG tablet TAKE 1&1/2 TABLETS ONCE DAILY. 07/22/20   Troy Sine, MD  meclizine (ANTIVERT) 25 MG tablet Take 1 tablet (25 mg total) by mouth 3 (three) times daily as needed for dizziness. 03/25/18   Deno Etienne, DO  nitroGLYCERIN (NITROSTAT) 0.4 MG SL tablet PLACE 1 TAB UNDER THE TONGUE AS NEEDED FOR CHEST PAIN MAY REPEAT EVERY 5 MINUTES. 09/26/19   [name], PA  pantoprazole (PROTONIX) 20 MG tablet TAKE 1 TABLET BY MOUTH TWICE DAILY. Patient taking differently: Pt takes once daily 02/17/20   Brand Males, MD  PARoxetine (PAXIL) 10 MG tablet Take 5 mg by mouth daily.     [provider]  Respiratory Therapy Supplies (FLUTTER) DEVI Use as directed 03/23/17   Baird Lyons D, MD  VYTORIN 10-20 MG per tablet Take 1 tablet by mouth daily.  07/30/13   [provider]    Allergies    Crestor [rosuvastatin], Iodinated diagnostic agents, Vicodin [hydrocodone-acetaminophen], and Isovue [iopamidol]  Review of Systems   Review of Systems  Respiratory: Positive for cough.   Skin: Positive for rash.  Neurological: Positive for headaches.  All other systems reviewed and are negative.   Physical Exam Updated Vital Signs BP (!) 149/65 (BP Location: Left Arm)   Pulse 73   Temp 97.7 F (36.5 C) (Oral)   Resp 16   Ht 5' 2.5" (1.588 m)   Wt 66.7 kg   SpO2 98%   BMI 26.46 kg/m    Physical Exam Vitals and nursing [name] reviewed.  Constitutional:      General: She is not in acute distress.    Appearance: She is well-developed.  HENT:     Head: Normocephalic and atraumatic.     Right Ear: Tympanic membrane normal.     Left Ear: Tympanic membrane normal.     Nose: Nose normal.     Mouth/Throat:     Mouth: Mucous membranes are moist.  Eyes:     Pupils: Pupils are equal, round, and reactive to light.  Neck:     Meningeal: Brudzinski's sign and Kernig's sign absent.     Comments: Full flexion and extension of the neck without any pain Cardiovascular:     Rate and Rhythm: Normal rate and regular rhythm.     Pulses: Normal pulses.     Heart sounds: Normal heart sounds. No murmur heard. No friction rub.  Pulmonary:     Effort: Pulmonary effort is normal.     Breath sounds: Normal breath sounds. No wheezing or rales.  Abdominal:     General: Bowel sounds are normal. There is no distension.     Palpations: Abdomen is soft.     Tenderness: There is no abdominal tenderness. There is no guarding or rebound.  Musculoskeletal:        General: No tenderness. Normal range of motion.     Cervical back: Normal range of motion and neck supple. No spinous process tenderness or muscular tenderness.     Comments: No edema  Skin:    General: Skin is warm and dry.     Findings: Rash present.     Comments: Petechial type scattered rash present over bilateral lower extremities.  No joint swelling or erythema  Neurological:     Mental Status: She is alert and oriented to person, place, and time. Mental status is at baseline.     Cranial Nerves: No cranial nerve deficit.  Psychiatric:        Mood and Affect: Mood normal.        Behavior: Behavior normal.  ED Results / Procedures / Treatments   Labs (all labs ordered are listed, but only abnormal results are displayed) Labs Reviewed  COMPREHENSIVE METABOLIC PANEL - Abnormal; Notable for the following components:       Result Value   Glucose, Bld 103 (*)    All other components within normal limits  CBC WITH DIFFERENTIAL/PLATELET  PROTIME-INR  APTT    EKG None  Radiology DG Chest 2 View  Result Date: 08/03/2020 CLINICAL DATA:  Cough. EXAM: CHEST - 2 VIEW COMPARISON:  May 12, 2020 FINDINGS: Mild, chronic appearing increased lung markings are seen within the bilateral lung bases. There is no evidence of acute infiltrate, pleural effusion or pneumothorax. The heart size and mediastinal contours are within normal limits. The visualized skeletal structures are unremarkable. IMPRESSION: Stable exam without active cardiopulmonary disease. Electronically Signed   By: Virgina Norfolk M.D.   On: 08/03/2020 15:54    Procedures Procedures   Medications Ordered in ED Medications - No data to display  ED Course  I have reviewed the triage vital signs and the nursing notes.  Pertinent labs & imaging results that were available during my care of the patient were reviewed by me and considered in my medical decision making (see chart for details).    MDM Rules/Calculators/A&P                          Patient presenting today after having URI symptoms last week and 2 days ago developing a petechial type rash on her lower extremities.  She overall is well-appearing with normal vital signs.  She takes no anticoagulation.  She has no meningeal signs and minimal frontal headache with low suspicion for meningitis at this time.  Patient does not have significant contact in areas that would be high risk for tick exposure.  She has no rash involving the palms or soles.  There is none present in her arms or upper trunk area.  Will ensure normal CBC and no evidence of ITP.   Patient's chest x-ray is within normal limits, CBC is normal with a platelet count of 245, PT/INR is within normal limits and CMP wnl.  She is well-appearing.  Will cover with doxycycline in case there is a tick related illness.  Patient has good  follow-up with her PCP and was given return precautions.  MDM Number of Diagnoses or Management Options   Amount and/or Complexity of Data Reviewed Clinical lab tests: ordered and reviewed Tests in the radiology section of CPT: ordered and reviewed Independent visualization of images, tracings, or specimens: yes  Risk of Complications, Morbidity, and/or Mortality Presenting problems: low Diagnostic procedures: low Management options: low  Patient Progress Patient progress: stable   Final Clinical Impression(s) / ED Diagnoses Final diagnoses:  Viral upper respiratory tract infection  Rash    Rx / DC Orders ED Discharge Orders         Ordered    doxycycline (VIBRAMYCIN) 100 MG capsule  2 times daily        08/03/20 1759           Blanchie Dessert, MD 08/03/20 1802

## 2020-08-03 NOTE — ED Provider Notes (Signed)
Emergency Medicine Provider Triage Evaluation Note  [name] , a 75 y.o. female  was evaluated in triage.  Pt complains of rash and cough.  Patient states she has had nasal congestion and cough.  3 negative COVID test at home.  She saw her PCP today, who was concerned about a nonblanching petechial rash of her bilateral lower extremities.  Is it spreading up her legs, it is not on her torso or upper extremities.  No itching or burning.  She denies fevers.  No known tick bite.  No new medications other than DayQuil NyQuil while patient was sick.  She is on aspirin, no other blood thinners.  Review of Systems  Positive: Rash, cough Negative: Fever, n/v  Physical Exam  BP (!) 149/65 (BP Location: Left Arm)   Pulse 73   Temp 97.7 F (36.5 C) (Oral)   Resp 16   Ht 5' 2.5" (1.588 m)   Wt 66.7 kg   SpO2 98%   BMI 26.46 kg/m  Gen:   Awake, no distress   Resp:  Normal effort, clear lung sounds MSK:   Moves extremities without difficulty  Other:  Nonblanching petechial rash bilateral lower extremities.  Does not involve the palms or the soles of the feet.  No rash noted on the torso or upper extremities.  Medical Decision Making  Medically screening exam initiated at 3:26 PM.  Appropriate orders placed.  [name] was informed that the remainder of the evaluation will be completed by another provider, this initial triage assessment does not replace that evaluation, and the importance of remaining in the ED until their evaluation is complete.  Due to petechial rash, will obtain labs.  Due to cough, will obtain chest x-ray.   Franchot Heidelberg, PA-C 08/03/20 1527    Blanchie Dessert, MD 08/03/20 2234

## 2020-08-03 NOTE — ED Triage Notes (Signed)
Patient states that she has a rash to her lower extremities x 2 days. Patient also c/o cough, intermittent headache and deep pain in her lower extremities. Patient went to her PCP today and wanted her to come to the ED to r/o [facility] spotted fever, Menicoccal meningitis.   Patient states she has had 3 negative home Covid tests.

## 2020-08-04 ENCOUNTER — Other Ambulatory Visit: Payer: Self-pay | Admitting: Internal Medicine

## 2020-08-04 ENCOUNTER — Ambulatory Visit (INDEPENDENT_AMBULATORY_CARE_PROVIDER_SITE_OTHER): Payer: Medicare PPO

## 2020-08-04 VITALS — BP 130/70 | HR 85 | Temp 97.7°F | Resp 19

## 2020-08-04 DIAGNOSIS — J4540 Moderate persistent asthma, uncomplicated: Secondary | ICD-10-CM | POA: Diagnosis not present

## 2020-08-04 MED ORDER — SODIUM CHLORIDE 0.9 % IV SOLN: Freq: Once | INTRAVENOUS | Status: DC | PRN

## 2020-08-04 MED ORDER — METHYLPREDNISOLONE SODIUM SUCC 125 MG IJ SOLR: 125.0000 mg | Freq: Once | INTRAMUSCULAR | Status: DC | PRN

## 2020-08-04 MED ORDER — ALBUTEROL SULFATE HFA 108 (90 BASE) MCG/ACT IN AERS: 2.0000 | INHALATION_SPRAY | Freq: Once | RESPIRATORY_TRACT | Status: DC | PRN

## 2020-08-04 MED ORDER — FAMOTIDINE IN NACL 20-0.9 MG/50ML-% IV SOLN: 20.0000 mg | Freq: Once | INTRAVENOUS | Status: DC | PRN

## 2020-08-04 MED ORDER — EPINEPHRINE 0.3 MG/0.3ML IJ SOAJ: 0.3000 mg | Freq: Once | INTRAMUSCULAR | Status: DC | PRN

## 2020-08-04 MED ORDER — DUPILUMAB 200 MG/1.14ML ~~LOC~~ SOSY
200.0000 mg | PREFILLED_SYRINGE | Freq: Once | SUBCUTANEOUS | Status: AC
  Administered 2020-08-04: 200 mg via SUBCUTANEOUS
  Filled 2020-08-04: qty 2.28

## 2020-08-04 MED ORDER — DIPHENHYDRAMINE HCL 50 MG/ML IJ SOLN: 50.0000 mg | Freq: Once | INTRAMUSCULAR | Status: DC | PRN

## 2020-08-04 NOTE — Progress Notes (Addendum)
Diagnosis: Asthma  Provider:  Praveen Mannam, MD  Procedure: Injection  Dupixent (Dupilumab), Dose: 200 mg, Site: subcutaneous  Discharge: Condition: Good, Destination: Home . AVS provided to patient.   Performed by:  Fern Canova, RN        

## 2020-08-12 DIAGNOSIS — M7061 Trochanteric bursitis, right hip: Secondary | ICD-10-CM | POA: Diagnosis not present

## 2020-08-12 DIAGNOSIS — M7062 Trochanteric bursitis, left hip: Secondary | ICD-10-CM | POA: Diagnosis not present

## 2020-08-14 DIAGNOSIS — M7061 Trochanteric bursitis, right hip: Secondary | ICD-10-CM | POA: Diagnosis not present

## 2020-08-14 DIAGNOSIS — M7062 Trochanteric bursitis, left hip: Secondary | ICD-10-CM | POA: Diagnosis not present

## 2020-08-17 ENCOUNTER — Ambulatory Visit: Payer: Medicare PPO

## 2020-08-18 ENCOUNTER — Other Ambulatory Visit: Payer: Self-pay

## 2020-08-18 ENCOUNTER — Ambulatory Visit (INDEPENDENT_AMBULATORY_CARE_PROVIDER_SITE_OTHER): Payer: Medicare PPO

## 2020-08-18 VITALS — BP 144/73 | HR 88 | Temp 97.5°F | Resp 18

## 2020-08-18 DIAGNOSIS — J4540 Moderate persistent asthma, uncomplicated: Secondary | ICD-10-CM

## 2020-08-18 MED ORDER — FAMOTIDINE IN NACL 20-0.9 MG/50ML-% IV SOLN: 20.0000 mg | Freq: Once | INTRAVENOUS | Status: DC | PRN

## 2020-08-18 MED ORDER — ALBUTEROL SULFATE HFA 108 (90 BASE) MCG/ACT IN AERS: 2.0000 | INHALATION_SPRAY | Freq: Once | RESPIRATORY_TRACT | Status: DC | PRN

## 2020-08-18 MED ORDER — METHYLPREDNISOLONE SODIUM SUCC 125 MG IJ SOLR: 125.0000 mg | Freq: Once | INTRAMUSCULAR | Status: DC | PRN

## 2020-08-18 MED ORDER — SODIUM CHLORIDE 0.9 % IV SOLN: Freq: Once | INTRAVENOUS | Status: DC | PRN

## 2020-08-18 MED ORDER — DIPHENHYDRAMINE HCL 50 MG/ML IJ SOLN: 50.0000 mg | Freq: Once | INTRAMUSCULAR | Status: DC | PRN

## 2020-08-18 MED ORDER — DUPILUMAB 200 MG/1.14ML ~~LOC~~ SOSY
200.0000 mg | PREFILLED_SYRINGE | Freq: Once | SUBCUTANEOUS | Status: AC
  Administered 2020-08-18: 200 mg via SUBCUTANEOUS
  Filled 2020-08-18: qty 2.28

## 2020-08-18 MED ORDER — EPINEPHRINE 0.3 MG/0.3ML IJ SOAJ: 0.3000 mg | Freq: Once | INTRAMUSCULAR | Status: DC | PRN

## 2020-08-18 NOTE — Progress Notes (Signed)
Diagnosis: Asthma  Provider:  Marshell Garfinkel, MD  Procedure: Injection  Dupixent (Dupilumab), Dose: 200 mg, Site: subcutaneous  Discharge: Condition: Good, Destination: Home . AVS provided to patient.   Performed by:  Jonelle Sidle, RN

## 2020-08-19 ENCOUNTER — Other Ambulatory Visit: Payer: Self-pay | Admitting: Cardiovascular Disease

## 2020-08-25 DIAGNOSIS — M7062 Trochanteric bursitis, left hip: Secondary | ICD-10-CM | POA: Diagnosis not present

## 2020-08-25 DIAGNOSIS — M7061 Trochanteric bursitis, right hip: Secondary | ICD-10-CM | POA: Diagnosis not present

## 2020-08-26 DIAGNOSIS — M7061 Trochanteric bursitis, right hip: Secondary | ICD-10-CM | POA: Diagnosis not present

## 2020-08-26 DIAGNOSIS — M7062 Trochanteric bursitis, left hip: Secondary | ICD-10-CM | POA: Diagnosis not present

## 2020-08-31 ENCOUNTER — Ambulatory Visit: Payer: Medicare PPO

## 2020-09-01 ENCOUNTER — Other Ambulatory Visit: Payer: Self-pay

## 2020-09-01 ENCOUNTER — Ambulatory Visit (INDEPENDENT_AMBULATORY_CARE_PROVIDER_SITE_OTHER): Payer: Medicare PPO

## 2020-09-01 VITALS — BP 140/76 | HR 64 | Temp 97.6°F | Resp 18

## 2020-09-01 DIAGNOSIS — J4540 Moderate persistent asthma, uncomplicated: Secondary | ICD-10-CM

## 2020-09-01 MED ORDER — METHYLPREDNISOLONE SODIUM SUCC 125 MG IJ SOLR: 125.0000 mg | Freq: Once | INTRAMUSCULAR | Status: DC | PRN

## 2020-09-01 MED ORDER — EPINEPHRINE 0.3 MG/0.3ML IJ SOAJ: 0.3000 mg | Freq: Once | INTRAMUSCULAR | Status: DC | PRN

## 2020-09-01 MED ORDER — FAMOTIDINE IN NACL 20-0.9 MG/50ML-% IV SOLN: 20.0000 mg | Freq: Once | INTRAVENOUS | Status: DC | PRN

## 2020-09-01 MED ORDER — SODIUM CHLORIDE 0.9 % IV SOLN: Freq: Once | INTRAVENOUS | Status: DC | PRN

## 2020-09-01 MED ORDER — DIPHENHYDRAMINE HCL 50 MG/ML IJ SOLN: 50.0000 mg | Freq: Once | INTRAMUSCULAR | Status: DC | PRN

## 2020-09-01 MED ORDER — ALBUTEROL SULFATE HFA 108 (90 BASE) MCG/ACT IN AERS: 2.0000 | INHALATION_SPRAY | Freq: Once | RESPIRATORY_TRACT | Status: DC | PRN

## 2020-09-01 MED ORDER — DUPILUMAB 200 MG/1.14ML ~~LOC~~ SOSY
200.0000 mg | PREFILLED_SYRINGE | Freq: Once | SUBCUTANEOUS | Status: AC
  Administered 2020-09-01: 200 mg via SUBCUTANEOUS
  Filled 2020-09-01: qty 2.28

## 2020-09-01 NOTE — Progress Notes (Signed)
Diagnosis: Asthma  Provider:  Marshell Garfinkel, MD  Procedure: Injection  Dupixent (Dupilumab), Dose: 200 mg, Site: subcutaneous, left arm  Discharge: Condition: Good, Destination: Home . AVS provided to patient.   Performed by:  Jena Gauss, RN

## 2020-09-02 DIAGNOSIS — G35 Multiple sclerosis: Secondary | ICD-10-CM | POA: Diagnosis not present

## 2020-09-02 DIAGNOSIS — M62838 Other muscle spasm: Secondary | ICD-10-CM | POA: Diagnosis not present

## 2020-09-02 DIAGNOSIS — R9089 Other abnormal findings on diagnostic imaging of central nervous system: Secondary | ICD-10-CM | POA: Diagnosis not present

## 2020-09-02 DIAGNOSIS — G4700 Insomnia, unspecified: Secondary | ICD-10-CM | POA: Diagnosis not present

## 2020-09-02 DIAGNOSIS — D849 Immunodeficiency, unspecified: Secondary | ICD-10-CM | POA: Diagnosis not present

## 2020-09-03 ENCOUNTER — Ambulatory Visit (INDEPENDENT_AMBULATORY_CARE_PROVIDER_SITE_OTHER): Payer: Medicare PPO | Admitting: Cardiovascular Disease

## 2020-09-03 ENCOUNTER — Encounter: Payer: Self-pay | Admitting: Cardiovascular Disease

## 2020-09-03 ENCOUNTER — Other Ambulatory Visit: Payer: Self-pay

## 2020-09-03 DIAGNOSIS — I350 Nonrheumatic aortic (valve) stenosis: Secondary | ICD-10-CM

## 2020-09-03 DIAGNOSIS — I10 Essential (primary) hypertension: Secondary | ICD-10-CM

## 2020-09-03 DIAGNOSIS — J45909 Unspecified asthma, uncomplicated: Secondary | ICD-10-CM | POA: Diagnosis not present

## 2020-09-03 DIAGNOSIS — G478 Other sleep disorders: Secondary | ICD-10-CM

## 2020-09-03 DIAGNOSIS — I2510 Atherosclerotic heart disease of native coronary artery without angina pectoris: Secondary | ICD-10-CM

## 2020-09-03 DIAGNOSIS — R002 Palpitations: Secondary | ICD-10-CM

## 2020-09-03 NOTE — Progress Notes (Signed)
Cardiology Office Note    Date:  09/04/2020   ID:  [name], Robyn April 06, 1945, MRN [id number]  PCP:  [name], MD  Cardiologist:  Shelva Majestic, MD     History of Present Illness:  [name] is a 75 y.o. female who presents for a 34-monthfollow-up evaluation.  Ms. HHoilandhas documented mild CAD by  cardiac catheterization in July 2008 by Dr. JRoe [name]  She had mild narrowing of 20-30% in the proximal and 10-20% in the mid left circumflex vessel.  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  She has been on medical therapy.  She also has a history of hypertension,.  Her last stress test was done in March 2014 were she had nonspecific T changes and nondiagnostic 0.5-1 mm inferolateral ST segment changes with stress.  Scintigraphic images revealed normal perfusion and function.   She has a history of multiple sclerosis and is followed by [name] [name]  She has a history of hyperlipidemia for which she takes Vytorin 10/20 and GERD for which he takes over-the-counter Prilosec.   She has experienced recent episodes of chest pain which have been occurring almost weekly. She experiences squeezing in her arms and jaw.  She denies chest pressure.  The symptoms are not associated with activity and typically resolve on her own.  She has taken nitroglycerin with questionable benefit.  She also notes calf discomfort at night while sleeping.  She denies restless legs.   She was started on a human monoclonal antibody ocrelizumab  for her multiple sclerosis and admits to improvement in symptomatology.  When I saw her  she had experienced recurrent episodes of chest pain with some atypical features.  She underwent a nuclear perfusion study in December 2016 which revealed normal perfusion with an ejection fraction of 66%.  She developed recurrent chest pain in January and again was felt to be stable cardiovascularly.  [facility] discomfort was due to esophageal lower esophageal sphincter spasm and inability to relax appropriately.  Her symptoms have improved with Protonix and she is now being weaned off Protonix and has been started on Zantac.   [name]  He had seen her in December 2018 with chest pain more consistent with esophageal spasm.  An echocardiogram in December 2018 showed an EF of 60 to 65% with grade 1 diastolic dysfunction, mild aortic stenosis with trivial AR.  She has had issues with asthma.  She also had experienced some palpitations and a 24-hour monitor revealed short bursts of SVT, PACs and PVCs.  Palpitations have improved though she still experiences some palpitations at night while falling asleep.  She eats chocolate on a daily basis.  Her sleep is very poor.  She has frequent awakenings.  She snores.  She has nocturia at least 3 times per night.  He denies any chest pain.    I saw her in September 2019 with her palpitations I recommended discontinuance of amlodipine and instituted Cardizem CD 240 mg.  We discussed avoidance of chocolate which contains caffeine.  Due to concerns for sleep apnea I also recommended she undergo a sleep evaluation.  She has also seen pulmonary since her last evaluation with complaints of wheezing and on March 25, 2018 was evaluated in the emergency room with dizziness.  She had fallen over a dog gate and struck her head.  A head CT did not reveal any acute intracranial abnormality.  There was mild age-appropriate cortical atrophy with moderate to severe chronic microvascular ischemic changes of the white matter.  She is scheduled to undergo a sleep study on January 27.  She continues to experience occasional palpitations at night.  She recently was started on a prednisone taper due to her wheezing has had some chest congestion.   She underwent  a sleep study on April 23, 2018.She was not found to have significant sleep apnea and her overall AHI was 1.1/h.  However,there was mildly sleep apnea during REM sleep with an AHI of 7.0/h.  There was evidence for soft snoring and her oxygen nadir was 88%.   I last evaluated her in a telemedicine visit on Aug 01, 2018.  At that time, she was no longer  taking her multiple sclerosis infusion, due to exacerbation of asthmatic inflammation.  She is now on dupixant and is followed by [name].  She does have issues at times where she feels like she just cannot get it very deep breath.  She denies chest pain.  She denies palpitations.  She continues to see her doctor in [place] for her multiple sclerosis.  Since her last evaluation with me, she has been evaluated by [name], [place] in July 2021.  She had undergone a repeat Myoview study on March 28, 2019 which showed an EF of 66% and no evidence for prior infarct or ischemia and was interpreted as low risk.  When I last saw her she was experiencing some atypical chest discomfort occurring approximately 2-3 times per month.  He had recommended titration of isosorbide to 90 mg to help with potential esophageal spasm.  She had a pulmonary evaluation with [name], NP on January 14, 2020.  Her asthma was felt to be well controlled..  It was recommended that she stop Zyrtec in addition to montelukast and Singulair and reduce to present to 200 mg every 2 weeks.  Follow-up in 3 months was recommended to do an exhaled nitric oxide testing.  I last saw her in November 2021 at which time she denied any chest pain worrisome for angina.    At times she notes a sharp twinge of discomfort.  She apparently is no longer taking amlodipine and is on diltiazem 240 mg daily, losartan 50 mg daily for hypertension and continues to be on isosorbide mononitrate 90 mg.  She is on Vytorin 10/20 for hyperlipidemia.  She was on a Dupixent injection every 14 days.  She  continued to be on Protonix 20 mg twice a day.    Since last saw her, she had labs yesterday by Dr. Delphia Grates in [place].  She has had issues with recent bursitis.  She has remained active and goes to the gym 2 times per week and rides a bicycle.  She tells me she had developed red spots and there was concern for possible [facility] spotted fever for which she was treated with antibiotics but her test was negative.  She has not had any anginal symptoms.  She presents for evaluation.   Past Medical History:  Diagnosis Date   Anemia    past history-many yrs ago   Anginal pain ([place])    being evaluated by Dr. Tyrone Sage, arm pain,"bad indigestion" -no heart related findings as of yet   Arthritis    hip. back pain   Complication of anesthesia  s/p Hysterectomy "vagal response "heart stopped" -did not require shocking.   Coronary artery disease    Dry eyes    Esophageal spasm    GERD (gastroesophageal reflux disease)    MS (multiple sclerosis) ([place])    stable-sees Jeffrey every 6 months    Past Surgical History:  Procedure Laterality Date   ABDOMINAL HYSTERECTOMY     BLEPHAROPLASTY Bilateral    CARDIAC CATHETERIZATION  10/04/06   MINOR CAD,SINGLE VESSEL INVOLVING THE CIRCUMFLEX. 20 TO 30% PROXIMALLY AND 10 TO 20% IN THE MIDDLE SEGMENT.MILD MUSCLE BRIDGING, MID LAD.NORMAL RCA.NORMAL LV FUNCTION.NORMAL MITRAL AND AORTIC VALVE.NORMAL APPEARING AORTA,THORACIC AND ABDOMINAL.NORMAL RENAL ARTERIES.   CARDIOLOGY NUCLEAR MED STUDY  06/22/12   NL LV FUNCTION,EF 68%,NL WALL MOTION.   CAROTID DUPLEX  07/02/11   FYT:WKMQ SOFT PLAQUE NOTED DISTAL CCA AND ORGIN AND PROXIMAL ICA,LEFT>RIGHT.NO ICA STENOSIS. VERTEBRAL ARTERY FLOW IS ANTEGRADE.   CESAREAN SECTION     x2    COLONOSCOPY WITH PROPOFOL N/A 12/11/2014   Procedure: COLONOSCOPY WITH PROPOFOL;  Surgeon: Ronald Lobo, MD;  Location: [facility];  Service: Endoscopy;  Laterality: N/A;   KNEE ARTHROSCOPY Left    scope   PARATHYROIDECTOMY      partial-many years ago   thumb surgery Bilateral    built up and bone removal   TONSILLECTOMY     TRANSTHORACIC ECHOCARDIOGRAM  07/02/11   LV CAVITY SIZE IS NORMAL. SYSTOLIC FUNCTION WAS NORMAL.EF=55% TO 60%.INCREASED RELATIVE CONTRIBUTION OF ATRIAL CONTRACTION TO VENTRICULAR FILLING;MAYBE DUE TO HYPOVOLEMIA. AV=MILD REGURG.    Current Medications: Outpatient Medications Prior to Visit  Medication Sig Dispense Refill   albuterol (PROAIR HFA) 108 (90 Base) MCG/ACT inhaler INHALE 1-2 PUFFS INTO THE LUNGS EVERY SIX HOURS AS NEEDED FOR WHEEZING OR SHORTNESS OF BREATH. 8.5 [name] 2   carbamazepine (TEGRETOL) 200 MG tablet Take 200 mg by mouth 2 (two) times daily between meals as needed.     Cholecalciferol (VITAMIN D PO) Take 1 tablet by mouth daily.     diazepam (VALIUM) 2 MG tablet Take 2 mg by mouth every 6 (six) hours as needed for anxiety.     diltiazem (CARDIZEM CD) 240 MG 24 hr capsule TAKE (1) CAPSULE DAILY. 90 capsule 1   DUPIXENT 200 MG/1.14ML prefilled syringe INJECT $RemoveBefore'200MG'[id number]$  SUBCUTANEOUSLY EVERY OTHER WEEK 2.28 mL 3   EPINEPHRINE 0.3 mg/0.3 mL IJ SOAJ injection USE AS DIRECTED 2 each 3   gabapentin (NEURONTIN) 300 MG capsule Take 300 mg by mouth daily as needed.      ipratropium-albuterol (DUONEB) 0.5-2.5 (3) MG/3ML SOLN Take 3 mLs by nebulization 2 (two) times daily. (Patient taking differently: Take 3 mLs by nebulization 2 (two) times daily. Pt takes as needed) 360 mL 5   isosorbide mononitrate (IMDUR) 60 MG 24 hr tablet Take 1.5 tablets (90 mg total) by mouth daily. 135 tablet 3   losartan (COZAAR) 50 MG tablet TAKE 1&1/2 TABLETS ONCE DAILY. 135 tablet 0   meclizine (ANTIVERT) 25 MG tablet Take 1 tablet (25 mg total) by mouth 3 (three) times daily as needed for dizziness. 30 tablet 0   nitroGLYCERIN (NITROSTAT) 0.4 MG SL tablet PLACE 1 TAB UNDER THE TONGUE AS NEEDED FOR CHEST PAIN MAY REPEAT EVERY 5 MINUTES. 25 tablet 3   pantoprazole (PROTONIX) 20 MG tablet TAKE 1 TABLET BY MOUTH TWICE  DAILY. (Patient taking differently: Pt takes once daily) 60 tablet 3   PARoxetine (PAXIL) 10 MG tablet Take 5 mg by mouth daily.      Respiratory  Therapy Supplies (FLUTTER) DEVI Use as directed 1 each 0   VYTORIN 10-20 MG per tablet Take 1 tablet by mouth daily.      doxycycline (VIBRAMYCIN) 100 MG capsule Take 1 capsule (100 mg total) by mouth 2 (two) times daily. 14 capsule 0   Facility-Administered Medications Prior to Visit  Medication Dose Route Frequency Provider Last Rate Last Admin   0.9 %  sodium chloride infusion   Intravenous Once PRN Tresa Res, RPH       albuterol (VENTOLIN HFA) 108 (90 Base) MCG/ACT inhaler 2 puff  2 puff Inhalation Once PRN Tresa Res, RPH       diphenhydrAMINE (BENADRYL) injection 50 mg  50 mg Intravenous Once PRN Tresa Res, RPH       dupilumab (DUPIXENT) prefilled syringe 200 mg  200 mg Subcutaneous Once Mannam, Praveen, MD       EPINEPHrine (EPI-PEN) injection 0.3 mg  0.3 mg Intramuscular Once PRN Tresa Res, RPH       famotidine (PEPCID) IVPB 20 mg premix  20 mg Intravenous Once PRN Tresa Res, [facility]       Mepolizumab SOLR 100 mg  100 mg Subcutaneous Q28 days Brand Males, MD   100 mg at 06/14/17 8416   Mepolizumab SOLR 100 mg  100 mg Subcutaneous Q28 days Brand Males, MD   100 mg at 07/12/17 6063   Mepolizumab SOLR 100 mg  100 mg Subcutaneous Q28 days Brand Males, MD   100 mg at 10/31/17 0160   methylPREDNISolone sodium succinate (SOLU-MEDROL) 125 mg/2 mL injection 125 mg  125 mg Intravenous Once PRN Tresa Res, RPH         Allergies:   Crestor [rosuvastatin], Iodinated diagnostic agents, Vicodin [hydrocodone-acetaminophen], and Isovue [iopamidol]   Social History   Socioeconomic History   Marital status: Divorced    Spouse name: Not on file   Number of children: Not on file   Years of education: Not on file   Highest education level: Not on file  Occupational History   Occupation: retired  Tobacco Use   Smoking status:  Never   Smokeless tobacco: Never  Vaping Use   Vaping Use: Never used  Substance and Sexual Activity   Alcohol use: Yes    Alcohol/week: 0.0 standard drinks    Comment: wine occ.   Drug use: No   Sexual activity: Not on file  Other Topics Concern   Not on file  Social History Narrative   Not on file   Social Determinants of Health   Financial Resource Strain: Not on file  Food Insecurity: Not on file  Transportation Needs: Not on file  Physical Activity: Not on file  Stress: Not on file  Social Connections: Not on file     Family History:  The patient's family history includes Asthma in her father; Bladder Cancer in her father; Heart disease in her mother; Heart failure in her father; Hyperlipidemia in her brother.   ROS General: Negative; No fevers, chills, or night sweats;  HEENT: Negative; No changes in vision or hearing, sinus congestion, difficulty swallowing Pulmonary: History of asthma Cardiovascular: Negative; No chest pain, presyncope, syncope, palpitations GI: Negative; No nausea, vomiting, diarrhea, or abdominal pain GU: Negative; No dysuria, hematuria, or difficulty voiding Musculoskeletal: Negative; no myalgias, joint pain, or weakness Hematologic/Oncology: Negative; no easy bruising, bleeding Endocrine: Negative; no heat/cold intolerance; no diabetes Neuro: Negative; no changes in balance, headaches Skin: Negative; No rashes or skin lesions Psychiatric: Negative; No behavioral problems, depression Sleep:  Negative; No snoring, daytime sleepiness, hypersomnolence, bruxism, restless legs, hypnogognic hallucinations, no cataplexy Other comprehensive 14 point system review is negative.   PHYSICAL EXAM:   VS:  BP 106/63   Pulse 74   Ht 5' 2" (1.575 m)   Wt 150 lb (68 kg)   SpO2 98%   BMI 27.44 kg/m     Repeat blood pressure by me was 126/70 supine 118/68 standing  Wt Readings from Last 3 Encounters:  09/03/20 150 lb (68 kg)  08/03/20 147 lb (66.7 kg)   03/24/20 148 lb 12.8 oz (67.5 kg)     General: Alert, oriented, no distress.  Skin: normal turgor, no rashes, warm and dry HEENT: Normocephalic, atraumatic. Pupils equal round and reactive to light; sclera anicteric; extraocular muscles intact;  Nose without nasal septal hypertrophy Mouth/Parynx benign; Mallinpatti scale 3 Neck: No JVD, no carotid bruits; normal carotid upstroke Lungs: clear to ausculatation and percussion; no wheezing or rales Chest wall: without tenderness to palpitation Heart: PMI not displaced, RRR, s1 s2 normal, 1/6 systolic murmur, no diastolic murmur, no rubs, gallops, thrills, or heaves Abdomen: soft, nontender; no hepatosplenomehaly, BS+; abdominal aorta nontender and not dilated by palpation. Back: no CVA tenderness Pulses 2+ Musculoskeletal: full range of motion, normal strength, no joint deformities Extremities: no clubbing cyanosis or edema, Homan's sign negative  Neurologic: grossly nonfocal; Cranial nerves grossly wnl Psychologic: Normal mood and affect   Studies/Labs Reviewed:   EKG:  EKG is  ordered today.  ECG (independently read by me):  NSR at 74; , no ectopy, normal intervals    ECG (independently read by me): Sinus bradycardia at 59, LAE, no ectopy  Recent Labs: BMP Latest Ref Rng & Units 08/03/2020 05/12/2020 03/25/2018  Glucose 70 - 99 mg/dL 103(H) 119(H) 108(H)  BUN 8 - 23 mg/dL _0 Creatinine 0.44 - 1.00 mg/dL 0.76 0.75 0.72  Sodium 135 - 145 mmol/L 141 137 136  Potassium 3.5 - 5.1 mmol/L 3.9 3.9 3.0(L)  Chloride 98 - 111 mmol/L 108 107 106  CO2 22 - 32 mmol/L 26 23 21(L)  Calcium 8.9 - 10.3 mg/dL 9.8 9.0 9.2     Hepatic Function Latest Ref Rng & Units 08/03/2020 05/12/2020 05/12/2020  Total Protein 6.5 - 8.1 [name]/dL 7.1 6.6 6.6  Albumin 3.5 - 5.0 [name]/dL 4.4 4.0 4.0  AST 15 - 41 U/L _1 ALT 0 - 44 U/L _2 Alk Phosphatase 38 - 126 U/L 46 46 46  Total Bilirubin 0.3 - 1.2 mg/dL 0.4 0.4 0.4  Bilirubin, Direct 0.0 - 0.3  mg/dL - 0.0 -    CBC Latest Ref Rng & Units 08/03/2020 05/12/2020 01/14/2020  WBC 4.0 - 10.5 K/uL 7.3 6.3 6.1  Hemoglobin 12.0 - 15.0 [name]/dL 13.8 13.7 13.3  Hematocrit 36.0 - 46.0 % 42.3 40.2 39.5  Platelets 150 - 400 K/uL 245 195.0 213.0   Lab Results  Component Value Date   MCV 96.8 08/03/2020   MCV 94.4 05/12/2020   MCV 92.2 01/14/2020   Lab Results  Component Value Date   TSH 0.735 07/02/2011   No results found for: HGBA1C   BNP No results found for: BNP  ProBNP No results found for: PROBNP   Lipid Panel  No results found for: CHOL, TRIG, HDL, CHOLHDL, VLDL, LDLCALC, LDLDIRECT, LABVLDL   RADIOLOGY: No results found.   Additional studies/ records that were reviewed today include:  I reviewed the patient's record since her last evaluation with  me in May 2020.  ASSESSMENT:    1. Coronary artery disease involving native coronary artery of native heart without angina pectoris   2. Palpitations   3. Mild aortic stenosis   4. Essential hypertension   5. UARS (upper airway resistance syndrome)   6. Asthma, unspecified asthma severity, unspecified whether complicated, unspecified whether persistent     PLAN:  Ms. Nikiyah Fackler is a 75 year old female with previous documentation of mild CAD as well as muscle bridging.  She has been documented as normal systolic function with grade 1 diastolic dysfunction and mild aortic stenosis.  She has multiple sclerosis as well as asthma.  In the past she had experienced some atypical chest pain and has undergone Myoview studies with the most recent in December 2020 which remained low risk.  Her blood pressure today is well controlled without orthostatic drop on her regimen consisting of diltiazem CD2 140 mg daily and losartan 75 mg daily.  She continues to be on isosorbide 90 mg which has been increased by [name] due to concerns for esophageal spasm.  This is stabilized.  She has a history of palpitations with previous documentation of  short bursts of SVT, PACs and PVCs.  These have improved.  She is on gabapentin for neuropathy.  She is on Dupixent injection every other week and takes DuoNeb inhalation therapy for her asthma.  Her GERD is controlled with pantoprazole.  She is sleeping well.  A prior sleep study had demonstrated increased upper airway resistance without definitive sleep apnea overall but mild sleep apnea during REM sleep and evidence for mild snoring.  She had laboratory done yesterday by Dr. [name] who manages her multiple sclerosis.  She sees Dr. Shelia Media for primary care.  As long as she remains stable I will see her in 1 year for evaluation   Medication Adjustments/Labs and Tests Ordered: Current medicines are reviewed at length with the patient today.  Concerns regarding medicines are outlined above.  Medication changes, Labs and Tests ordered today are listed in the Patient Instructions below. Patient Instructions  Medication Instructions:  Your physician recommends that you continue on your current medications as directed. Please refer to the Current Medication list given to you today.  *If you need a refill on your cardiac medications before your next appointment, please call your pharmacy*   Lab Work: None ordered.    Testing/Procedures: None ordered.   Follow-Up: At [facility], you and your health needs are our priority.  As part of our continuing mission to provide you with exceptional heart care, we have created designated Provider Care Teams.  These Care Teams include your primary Cardiologist (physician) and Advanced Practice Providers (APPs -  Physician Assistants and Nurse Practitioners) who all work together to provide you with the care you need, when you need it.  We recommend signing up for the patient portal called "MyChart".  Sign up information is provided on this After Visit Summary.  MyChart is used to connect with patients for Virtual Visits (Telemedicine).  Patients are able to  view lab/test results, encounter notes, upcoming appointments, etc.  Non-urgent messages can be sent to your provider as well.   To learn more about what you can do with MyChart, go to [email or web address].    Your next appointment:   12 month(s)  The format for your next appointment:   In Person  Provider:   Shelva Majestic, MD     Signed, Shelva Majestic, MD  09/04/2020 5:05 PM  [facility] [address] [place], [place], [place]  [address] Phone: [phone]

## 2020-09-03 NOTE — Patient Instructions (Signed)

## 2020-09-04 ENCOUNTER — Encounter: Payer: Self-pay | Admitting: Cardiovascular Disease

## 2020-09-04 DIAGNOSIS — M7061 Trochanteric bursitis, right hip: Secondary | ICD-10-CM | POA: Diagnosis not present

## 2020-09-04 DIAGNOSIS — M7062 Trochanteric bursitis, left hip: Secondary | ICD-10-CM | POA: Diagnosis not present

## 2020-09-14 ENCOUNTER — Ambulatory Visit: Payer: Medicare PPO

## 2020-09-15 ENCOUNTER — Other Ambulatory Visit: Payer: Self-pay

## 2020-09-15 ENCOUNTER — Ambulatory Visit (INDEPENDENT_AMBULATORY_CARE_PROVIDER_SITE_OTHER): Payer: Medicare PPO

## 2020-09-15 VITALS — BP 117/70 | Temp 97.5°F | Resp 18

## 2020-09-15 DIAGNOSIS — J4540 Moderate persistent asthma, uncomplicated: Secondary | ICD-10-CM

## 2020-09-15 MED ORDER — DUPILUMAB 200 MG/1.14ML ~~LOC~~ SOSY
200.0000 mg | PREFILLED_SYRINGE | Freq: Once | SUBCUTANEOUS | Status: AC
  Administered 2020-09-15: 200 mg via SUBCUTANEOUS
  Filled 2020-09-15: qty 2.28

## 2020-09-15 NOTE — Addendum Note (Signed)
Addended by: Tresa Res on: 09/15/2020 08:39 PM   Modules accepted: Orders

## 2020-09-15 NOTE — Progress Notes (Signed)
Diagnosis: Asthma  Provider:  Marshell Garfinkel, MD  Procedure: Injection  Dupixent (Dupilumab), Dose: 200 mg, Site: subcutaneous  Discharge: Condition: Good, Destination: Home . AVS provided to patient.   Performed by:  Koren Shiver, RN

## 2020-09-21 DIAGNOSIS — L723 Sebaceous cyst: Secondary | ICD-10-CM | POA: Diagnosis not present

## 2020-09-21 DIAGNOSIS — M310 Hypersensitivity angiitis: Secondary | ICD-10-CM | POA: Diagnosis not present

## 2020-09-21 DIAGNOSIS — D692 Other nonthrombocytopenic purpura: Secondary | ICD-10-CM | POA: Diagnosis not present

## 2020-09-21 DIAGNOSIS — D485 Neoplasm of uncertain behavior of skin: Secondary | ICD-10-CM | POA: Diagnosis not present

## 2020-09-21 DIAGNOSIS — E039 Hypothyroidism, unspecified: Secondary | ICD-10-CM | POA: Diagnosis not present

## 2020-09-21 DIAGNOSIS — R7301 Impaired fasting glucose: Secondary | ICD-10-CM | POA: Diagnosis not present

## 2020-09-21 DIAGNOSIS — L820 Inflamed seborrheic keratosis: Secondary | ICD-10-CM | POA: Diagnosis not present

## 2020-09-21 DIAGNOSIS — L309 Dermatitis, unspecified: Secondary | ICD-10-CM | POA: Diagnosis not present

## 2020-09-21 DIAGNOSIS — Z0000 Encounter for general adult medical examination without abnormal findings: Secondary | ICD-10-CM | POA: Diagnosis not present

## 2020-09-24 DIAGNOSIS — Z0000 Encounter for general adult medical examination without abnormal findings: Secondary | ICD-10-CM | POA: Diagnosis not present

## 2020-09-24 DIAGNOSIS — I10 Essential (primary) hypertension: Secondary | ICD-10-CM | POA: Diagnosis not present

## 2020-09-24 DIAGNOSIS — M810 Age-related osteoporosis without current pathological fracture: Secondary | ICD-10-CM | POA: Diagnosis not present

## 2020-09-24 DIAGNOSIS — G35 Multiple sclerosis: Secondary | ICD-10-CM | POA: Diagnosis not present

## 2020-09-24 DIAGNOSIS — K219 Gastro-esophageal reflux disease without esophagitis: Secondary | ICD-10-CM | POA: Diagnosis not present

## 2020-09-24 DIAGNOSIS — I2510 Atherosclerotic heart disease of native coronary artery without angina pectoris: Secondary | ICD-10-CM | POA: Diagnosis not present

## 2020-09-24 DIAGNOSIS — I700 Atherosclerosis of aorta: Secondary | ICD-10-CM | POA: Diagnosis not present

## 2020-09-24 DIAGNOSIS — K580 Irritable bowel syndrome with diarrhea: Secondary | ICD-10-CM | POA: Diagnosis not present

## 2020-09-24 DIAGNOSIS — R519 Headache, unspecified: Secondary | ICD-10-CM | POA: Diagnosis not present

## 2020-09-29 ENCOUNTER — Other Ambulatory Visit: Payer: Self-pay

## 2020-09-29 ENCOUNTER — Ambulatory Visit (INDEPENDENT_AMBULATORY_CARE_PROVIDER_SITE_OTHER): Payer: Medicare PPO | Admitting: *Deleted

## 2020-09-29 VITALS — BP 128/68 | HR 73 | Temp 97.4°F | Resp 20

## 2020-09-29 DIAGNOSIS — J4540 Moderate persistent asthma, uncomplicated: Secondary | ICD-10-CM | POA: Diagnosis not present

## 2020-09-29 MED ORDER — DUPILUMAB 200 MG/1.14ML ~~LOC~~ SOSY
200.0000 mg | PREFILLED_SYRINGE | Freq: Once | SUBCUTANEOUS | Status: AC
  Administered 2020-09-29: 200 mg via SUBCUTANEOUS

## 2020-09-29 NOTE — Progress Notes (Signed)
Diagnosis: Asthma  Provider:  Marshell Garfinkel, MD  Procedure: Injection  Dupixent (Dupilumab), Dose: 200 mg, Site: subcutaneous  Discharge: Condition: Good, Destination: Home . AVS provided to patient.   Performed by:  Oren Beckmann, RN

## 2020-10-01 DIAGNOSIS — M7062 Trochanteric bursitis, left hip: Secondary | ICD-10-CM | POA: Diagnosis not present

## 2020-10-01 DIAGNOSIS — M7061 Trochanteric bursitis, right hip: Secondary | ICD-10-CM | POA: Diagnosis not present

## 2020-10-07 DIAGNOSIS — L817 Pigmented purpuric dermatosis: Secondary | ICD-10-CM | POA: Diagnosis not present

## 2020-10-07 DIAGNOSIS — R1030 Lower abdominal pain, unspecified: Secondary | ICD-10-CM | POA: Diagnosis not present

## 2020-10-07 DIAGNOSIS — R3915 Urgency of urination: Secondary | ICD-10-CM | POA: Diagnosis not present

## 2020-10-07 DIAGNOSIS — R3911 Hesitancy of micturition: Secondary | ICD-10-CM | POA: Diagnosis not present

## 2020-10-12 ENCOUNTER — Ambulatory Visit: Payer: Medicare PPO

## 2020-10-12 DIAGNOSIS — Z6827 Body mass index (BMI) 27.0-27.9, adult: Secondary | ICD-10-CM | POA: Diagnosis not present

## 2020-10-12 DIAGNOSIS — R319 Hematuria, unspecified: Secondary | ICD-10-CM | POA: Diagnosis not present

## 2020-10-12 DIAGNOSIS — Z1231 Encounter for screening mammogram for malignant neoplasm of breast: Secondary | ICD-10-CM | POA: Diagnosis not present

## 2020-10-12 DIAGNOSIS — Z01419 Encounter for gynecological examination (general) (routine) without abnormal findings: Secondary | ICD-10-CM | POA: Diagnosis not present

## 2020-10-13 ENCOUNTER — Ambulatory Visit (INDEPENDENT_AMBULATORY_CARE_PROVIDER_SITE_OTHER): Payer: Medicare PPO

## 2020-10-13 ENCOUNTER — Other Ambulatory Visit: Payer: Self-pay

## 2020-10-13 VITALS — BP 113/61 | HR 86 | Temp 97.5°F

## 2020-10-13 DIAGNOSIS — J4540 Moderate persistent asthma, uncomplicated: Secondary | ICD-10-CM

## 2020-10-13 MED ORDER — ALBUTEROL SULFATE HFA 108 (90 BASE) MCG/ACT IN AERS: 2.0000 | INHALATION_SPRAY | Freq: Once | RESPIRATORY_TRACT | Status: DC | PRN

## 2020-10-13 MED ORDER — FAMOTIDINE IN NACL 20-0.9 MG/50ML-% IV SOLN: 20.0000 mg | Freq: Once | INTRAVENOUS | Status: DC | PRN

## 2020-10-13 MED ORDER — METHYLPREDNISOLONE SODIUM SUCC 125 MG IJ SOLR: 125.0000 mg | Freq: Once | INTRAMUSCULAR | Status: DC | PRN

## 2020-10-13 MED ORDER — EPINEPHRINE 0.3 MG/0.3ML IJ SOAJ: 0.3000 mg | Freq: Once | INTRAMUSCULAR | Status: DC | PRN

## 2020-10-13 MED ORDER — DIPHENHYDRAMINE HCL 50 MG/ML IJ SOLN: 50.0000 mg | Freq: Once | INTRAMUSCULAR | Status: DC | PRN

## 2020-10-13 MED ORDER — SODIUM CHLORIDE 0.9 % IV SOLN: Freq: Once | INTRAVENOUS | Status: DC | PRN

## 2020-10-13 MED ORDER — DUPILUMAB 200 MG/1.14ML ~~LOC~~ SOSY
200.0000 mg | PREFILLED_SYRINGE | Freq: Once | SUBCUTANEOUS | Status: AC
  Administered 2020-10-13: 200 mg via SUBCUTANEOUS

## 2020-10-13 NOTE — Progress Notes (Signed)
Diagnosis: Asthma  Provider:  Marshell Garfinkel, MD  Procedure: Injection  Dupixent (Dupilumab), Dose: 200 mg, Site: subcutaneous, left arm  Discharge: Condition: Good, Destination: Home . AVS provided to patient.   Performed by:  Montina Dorrance, Sherlon Handing, LPN

## 2020-10-14 DIAGNOSIS — M25562 Pain in left knee: Secondary | ICD-10-CM | POA: Diagnosis not present

## 2020-10-14 DIAGNOSIS — M1712 Unilateral primary osteoarthritis, left knee: Secondary | ICD-10-CM | POA: Diagnosis not present

## 2020-10-14 DIAGNOSIS — M7062 Trochanteric bursitis, left hip: Secondary | ICD-10-CM | POA: Diagnosis not present

## 2020-10-16 DIAGNOSIS — R351 Nocturia: Secondary | ICD-10-CM | POA: Diagnosis not present

## 2020-10-16 DIAGNOSIS — R350 Frequency of micturition: Secondary | ICD-10-CM | POA: Diagnosis not present

## 2020-10-16 DIAGNOSIS — R3914 Feeling of incomplete bladder emptying: Secondary | ICD-10-CM | POA: Diagnosis not present

## 2020-10-16 DIAGNOSIS — R3121 Asymptomatic microscopic hematuria: Secondary | ICD-10-CM | POA: Diagnosis not present

## 2020-10-22 ENCOUNTER — Telehealth: Payer: Self-pay | Admitting: Pharmacist

## 2020-10-22 NOTE — Telephone Encounter (Signed)
Received refill request from [facility] for patient's Dupixent.  Her current dose is 200 mg every 2 weeks which she receives at [facility]. Last dose was 10/13/20. She has one refill remaining at [facility].  Patient was last seen in October 2021 by Derl Barrow with expected f/u with [name] in February 2022.  Knox Saliva, PharmD, MPH, BCPS Clinical Pharmacist (Rheumatology and Pulmonology)

## 2020-10-23 ENCOUNTER — Ambulatory Visit: Payer: Medicare PPO | Admitting: Orthopedic Surgery

## 2020-10-23 ENCOUNTER — Encounter: Payer: Self-pay | Admitting: Orthopedic Surgery

## 2020-10-23 ENCOUNTER — Other Ambulatory Visit: Payer: Self-pay

## 2020-10-23 DIAGNOSIS — M25552 Pain in left hip: Secondary | ICD-10-CM

## 2020-10-23 NOTE — Progress Notes (Signed)
Office Visit Note   Patient: [name]           Date of Birth: Aug 15, 1945           MRN: [id number] Visit Date: 10/23/2020 Requested by: [name], MD [address] [place],  [place] [address] PCP: [name], MD  Subjective: Chief Complaint  Patient presents with   Left Hip - Pain    HPI: [name] is a 75 year old patient with bilateral hip pain left worse than right.  She has severe pain at times which inhibits her ability to ambulate.  She has had 5 injections since December and has injections only last 3 to 4 weeks.  But she does get relief from them.  She does have MS.  She had an injection 1 week ago.  She states that she "collapses" at times and has to use a cane.  Denies any back pain or numbness and tingling down the legs.  Pain does wake her from sleep at night at times.  She has had physical therapy.  She has an MRI scan pending for 2 weeks.  She is here for second opinion.              ROS: All systems reviewed are negative as they relate to the chief complaint within the history of present illness.  Patient denies  fevers or chills.   Assessment & Plan: Visit Diagnoses:  1. Pain in left hip     Plan: Impression is left hip pain with no significant groin symptoms.  This looks like it is either a fairly significant case of trochanteric bursitis which is supported by the fact that it does get better albeit temporarily from injections versus a degenerative tearing of the gluteus minimus and medius tendon which is more in line with the severe nature of the pain and the collapsing.  MRI scan would help to differentiate between the 2.  If she wants me to review the findings of the MRI scan I asked her to bring back by a copy of the disc and report.  Follow-Up Instructions: Return if symptoms worsen or fail to improve.   Orders:  No orders of the defined types were placed in this encounter.  No orders of the defined types were placed in this  encounter.     Procedures: No procedures performed   Clinical Data: No additional findings.  Objective: Vital Signs: There were no vitals taken for this visit.  Physical Exam:   Constitutional: Patient appears well-developed HEENT:  Head: Normocephalic Eyes:EOM are normal Neck: Normal range of motion Cardiovascular: Normal rate Pulmonary/chest: Effort normal Neurologic: Patient is alert Skin: Skin is warm Psychiatric: Patient has normal mood and affect   Ortho Exam: Ortho exam demonstrates pretty normal gait alignment today.  No Trendelenburg gait.  No groin pain on either side with internal ex rotation of the leg.  No discrete trochanteric tenderness although is slightly more tender on the left than the right.  She does have a little bit of pelvic tilt when standing on the left-hand side which is not present on the right.  Slight weakness to hip flexion strength when seated 5-5 on the left compared to the right.  No other masses lymphadenopathy or skin changes noted in that left hip region.  Specialty Comments:  No specialty comments available.  Imaging: No results found.   PMFS History: Patient Active Problem List   Diagnosis Date Noted   Moderate persistent asthma 06/03/2020  Palpitations 03/24/2020   Mild aortic stenosis 03/24/2020   Right carotid bruit 03/24/2020   Simple chronic bronchitis (HCC) 04/09/2018   Oral thrush 04/09/2018   Low immunoglobulin level 08/23/2017   Asthma 10/07/2016   Tightness in chest 06/01/2015   Nodule of left lung 06/01/2015   Pain in the chest    Leukocytosis 04/25/2015   Hyperlipidemia LDL goal <70 02/25/2014   Peripheral neuropathy 02/25/2014   GERD (gastroesophageal reflux disease) 02/25/2014   Mild CAD 08/29/2013   Chest pain 08/29/2013   Multiple sclerosis ([place]) 08/29/2013   Sinus tarsi syndrome of right ankle 08/01/2013   Pes cavus 08/01/2013   Syncope and collapse 07/02/2011   Abdominal pain 07/02/2011   Nausea &  vomiting 07/02/2011   Ovarian cyst 07/02/2011   Weakness generalized 07/02/2011   Diarrhea 07/02/2011   Past Medical History:  Diagnosis Date   Anemia    past history-many yrs ago   Anginal pain ([place])    being evaluated by Dr. Tyrone Sage, arm pain,"bad indigestion" -no heart related findings as of yet   Arthritis    hip. back pain   Complication of anesthesia    s/p Hysterectomy "vagal response "heart stopped" -did not require shocking.   Coronary artery disease    Dry eyes    Esophageal spasm    GERD (gastroesophageal reflux disease)    MS (multiple sclerosis) ([place])    stable-sees [name] every 6 months    Family History  Problem Relation Age of Onset   Heart disease Mother    Asthma Father    Bladder Cancer Father    Heart failure Father    Hyperlipidemia Brother     Past Surgical History:  Procedure Laterality Date   ABDOMINAL HYSTERECTOMY     BLEPHAROPLASTY Bilateral    CARDIAC CATHETERIZATION  10/04/06   MINOR CAD,SINGLE VESSEL INVOLVING THE CIRCUMFLEX. 20 TO 30% PROXIMALLY AND 10 TO 20% IN THE MIDDLE SEGMENT.MILD MUSCLE BRIDGING, MID LAD.NORMAL RCA.NORMAL LV FUNCTION.NORMAL MITRAL AND AORTIC VALVE.NORMAL APPEARING AORTA,THORACIC AND ABDOMINAL.NORMAL RENAL ARTERIES.   CARDIOLOGY NUCLEAR MED STUDY  06/22/12   NL LV FUNCTION,EF 68%,NL WALL MOTION.   CAROTID DUPLEX  07/02/11   [id number] SOFT PLAQUE NOTED DISTAL CCA AND ORGIN AND PROXIMAL ICA,LEFT>RIGHT.NO ICA STENOSIS. VERTEBRAL ARTERY FLOW IS ANTEGRADE.   CESAREAN SECTION     x2    COLONOSCOPY WITH PROPOFOL N/A 12/11/2014   Procedure: COLONOSCOPY WITH PROPOFOL;  Surgeon: Ronald Lobo, MD;  Location: [facility];  Service: Endoscopy;  Laterality: N/A;   KNEE ARTHROSCOPY Left    scope   PARATHYROIDECTOMY     partial-many years ago   thumb surgery Bilateral    built up and bone removal   TONSILLECTOMY     TRANSTHORACIC ECHOCARDIOGRAM  07/02/11   LV CAVITY SIZE IS NORMAL. SYSTOLIC FUNCTION WAS NORMAL.EF=55% TO  60%.INCREASED RELATIVE CONTRIBUTION OF ATRIAL CONTRACTION TO VENTRICULAR FILLING;MAYBE DUE TO HYPOVOLEMIA. AV=MILD REGURG.   Social History   Occupational History   Occupation: retired  Tobacco Use   Smoking status: Never   Smokeless tobacco: Never  Vaping Use   Vaping Use: Never used  Substance and Sexual Activity   Alcohol use: Yes    Alcohol/week: 0.0 standard drinks    Comment: wine occ.   Drug use: No   Sexual activity: Not on file

## 2020-10-26 ENCOUNTER — Ambulatory Visit: Payer: Medicare PPO

## 2020-10-27 ENCOUNTER — Ambulatory Visit: Payer: Medicare PPO

## 2020-10-30 ENCOUNTER — Ambulatory Visit: Payer: Medicare PPO

## 2020-11-02 ENCOUNTER — Other Ambulatory Visit: Payer: Self-pay | Admitting: Cardiovascular Disease

## 2020-11-02 DIAGNOSIS — L821 Other seborrheic keratosis: Secondary | ICD-10-CM | POA: Diagnosis not present

## 2020-11-02 DIAGNOSIS — L720 Epidermal cyst: Secondary | ICD-10-CM | POA: Diagnosis not present

## 2020-11-02 DIAGNOSIS — L820 Inflamed seborrheic keratosis: Secondary | ICD-10-CM | POA: Diagnosis not present

## 2020-11-03 ENCOUNTER — Ambulatory Visit (INDEPENDENT_AMBULATORY_CARE_PROVIDER_SITE_OTHER): Payer: Medicare PPO

## 2020-11-03 ENCOUNTER — Other Ambulatory Visit: Payer: Self-pay

## 2020-11-03 VITALS — BP 138/71 | HR 72 | Temp 98.3°F | Resp 20

## 2020-11-03 DIAGNOSIS — M25552 Pain in left hip: Secondary | ICD-10-CM | POA: Diagnosis not present

## 2020-11-03 DIAGNOSIS — E7801 Familial hypercholesterolemia: Secondary | ICD-10-CM | POA: Diagnosis not present

## 2020-11-03 DIAGNOSIS — J4540 Moderate persistent asthma, uncomplicated: Secondary | ICD-10-CM

## 2020-11-03 MED ORDER — DUPILUMAB 200 MG/1.14ML ~~LOC~~ SOSY
200.0000 mg | PREFILLED_SYRINGE | Freq: Once | SUBCUTANEOUS | Status: AC
  Administered 2020-11-03: 200 mg via SUBCUTANEOUS
  Filled 2020-11-03: qty 2.28

## 2020-11-03 NOTE — Telephone Encounter (Signed)
Pt has an appt scheduled with MR 9/19. Will close encounter.

## 2020-11-03 NOTE — Progress Notes (Signed)
Diagnosis: Asthma  Provider:  Marshell Garfinkel, MD  Procedure: Injection  Dupixent (Dupilumab), Dose: 200 mg, Site: subcutaneous rt arm  Discharge: Condition: Good, Destination: Home . AVS provided to patient.   Performed by:  Jennings Corado, Sherlon Handing, LPN

## 2020-11-04 DIAGNOSIS — K5730 Diverticulosis of large intestine without perforation or abscess without bleeding: Secondary | ICD-10-CM | POA: Diagnosis not present

## 2020-11-04 DIAGNOSIS — R3121 Asymptomatic microscopic hematuria: Secondary | ICD-10-CM | POA: Diagnosis not present

## 2020-11-04 DIAGNOSIS — K760 Fatty (change of) liver, not elsewhere classified: Secondary | ICD-10-CM | POA: Diagnosis not present

## 2020-11-04 DIAGNOSIS — R3129 Other microscopic hematuria: Secondary | ICD-10-CM | POA: Diagnosis not present

## 2020-11-05 DIAGNOSIS — E7800 Pure hypercholesterolemia, unspecified: Secondary | ICD-10-CM | POA: Diagnosis not present

## 2020-11-09 ENCOUNTER — Ambulatory Visit: Payer: Medicare PPO

## 2020-11-10 ENCOUNTER — Ambulatory Visit: Payer: Medicare PPO

## 2020-11-13 ENCOUNTER — Ambulatory Visit: Payer: Medicare PPO

## 2020-11-13 ENCOUNTER — Other Ambulatory Visit: Payer: Self-pay | Admitting: Physician Assistant

## 2020-11-17 ENCOUNTER — Other Ambulatory Visit: Payer: Self-pay | Admitting: Internal Medicine

## 2020-11-17 ENCOUNTER — Other Ambulatory Visit: Payer: Self-pay

## 2020-11-17 ENCOUNTER — Ambulatory Visit (INDEPENDENT_AMBULATORY_CARE_PROVIDER_SITE_OTHER): Payer: Medicare PPO

## 2020-11-17 VITALS — BP 129/62 | HR 72 | Temp 97.8°F | Resp 18

## 2020-11-17 DIAGNOSIS — E042 Nontoxic multinodular goiter: Secondary | ICD-10-CM

## 2020-11-17 DIAGNOSIS — J4540 Moderate persistent asthma, uncomplicated: Secondary | ICD-10-CM | POA: Diagnosis not present

## 2020-11-17 DIAGNOSIS — Z6827 Body mass index (BMI) 27.0-27.9, adult: Secondary | ICD-10-CM | POA: Diagnosis not present

## 2020-11-17 DIAGNOSIS — E039 Hypothyroidism, unspecified: Secondary | ICD-10-CM | POA: Diagnosis not present

## 2020-11-17 DIAGNOSIS — I2510 Atherosclerotic heart disease of native coronary artery without angina pectoris: Secondary | ICD-10-CM | POA: Diagnosis not present

## 2020-11-17 DIAGNOSIS — E892 Postprocedural hypoparathyroidism: Secondary | ICD-10-CM | POA: Diagnosis not present

## 2020-11-17 DIAGNOSIS — R12 Heartburn: Secondary | ICD-10-CM | POA: Diagnosis not present

## 2020-11-17 DIAGNOSIS — I493 Ventricular premature depolarization: Secondary | ICD-10-CM | POA: Diagnosis not present

## 2020-11-17 MED ORDER — DUPILUMAB 200 MG/1.14ML ~~LOC~~ SOSY
200.0000 mg | PREFILLED_SYRINGE | Freq: Once | SUBCUTANEOUS | Status: AC
  Administered 2020-11-17: 200 mg via SUBCUTANEOUS
  Filled 2020-11-17: qty 2.28

## 2020-11-17 NOTE — Progress Notes (Signed)
Diagnosis: Asthma  Provider:  Marshell Garfinkel, MD  Procedure: Injection  Dupixent (Dupilumab), Dose: 200 mg, Site: subcutaneous  Discharge: Condition: Good, Destination: Home . AVS provided to patient.   Performed by:  Charlie Pitter, RN

## 2020-11-19 ENCOUNTER — Ambulatory Visit
Admission: RE | Admit: 2020-11-19 | Discharge: 2020-11-19 | Disposition: A | Discharge status: Home / Self Care | Payer: Medicare PPO | Source: Ambulatory Visit | Attending: Internal Medicine | Admitting: Internal Medicine

## 2020-11-19 ENCOUNTER — Other Ambulatory Visit: Payer: Self-pay

## 2020-11-19 DIAGNOSIS — E042 Nontoxic multinodular goiter: Secondary | ICD-10-CM

## 2020-11-19 DIAGNOSIS — E041 Nontoxic single thyroid nodule: Secondary | ICD-10-CM | POA: Diagnosis not present

## 2020-11-23 ENCOUNTER — Other Ambulatory Visit: Payer: Self-pay | Admitting: Internal Medicine

## 2020-11-23 ENCOUNTER — Ambulatory Visit: Payer: Medicare PPO

## 2020-11-24 ENCOUNTER — Ambulatory Visit: Payer: Medicare PPO

## 2020-11-25 DIAGNOSIS — M25552 Pain in left hip: Secondary | ICD-10-CM | POA: Diagnosis not present

## 2020-11-25 DIAGNOSIS — R3914 Feeling of incomplete bladder emptying: Secondary | ICD-10-CM | POA: Diagnosis not present

## 2020-11-25 DIAGNOSIS — M7062 Trochanteric bursitis, left hip: Secondary | ICD-10-CM | POA: Diagnosis not present

## 2020-11-25 DIAGNOSIS — R3121 Asymptomatic microscopic hematuria: Secondary | ICD-10-CM | POA: Diagnosis not present

## 2020-11-27 ENCOUNTER — Ambulatory Visit: Payer: Medicare PPO

## 2020-12-01 ENCOUNTER — Other Ambulatory Visit: Payer: Self-pay

## 2020-12-01 ENCOUNTER — Ambulatory Visit (INDEPENDENT_AMBULATORY_CARE_PROVIDER_SITE_OTHER): Payer: Medicare PPO

## 2020-12-01 VITALS — BP 126/74 | HR 74 | Temp 97.6°F | Resp 18

## 2020-12-01 DIAGNOSIS — J4540 Moderate persistent asthma, uncomplicated: Secondary | ICD-10-CM

## 2020-12-01 MED ORDER — DUPILUMAB 200 MG/1.14ML ~~LOC~~ SOSY
200.0000 mg | PREFILLED_SYRINGE | Freq: Once | SUBCUTANEOUS | Status: AC
  Administered 2020-12-01: 200 mg via SUBCUTANEOUS

## 2020-12-01 NOTE — Progress Notes (Signed)
Diagnosis: Asthma  Provider:  Marshell Garfinkel, MD  Procedure: Injection  Dupixent (Dupilumab), Dose: 200 mg, Site: subcutaneous  Discharge: Condition: Good, Destination: Home . AVS provided to patient.   Performed by:  Simran Mannis, Sherlon Handing, LPN

## 2020-12-02 DIAGNOSIS — M810 Age-related osteoporosis without current pathological fracture: Secondary | ICD-10-CM | POA: Diagnosis not present

## 2020-12-07 ENCOUNTER — Ambulatory Visit: Payer: Medicare PPO

## 2020-12-08 ENCOUNTER — Ambulatory Visit: Payer: Medicare PPO

## 2020-12-11 ENCOUNTER — Ambulatory Visit: Payer: Medicare PPO | Admitting: Internal Medicine

## 2020-12-11 ENCOUNTER — Ambulatory Visit: Payer: Medicare PPO

## 2020-12-14 ENCOUNTER — Ambulatory Visit: Payer: Medicare PPO | Admitting: Internal Medicine

## 2020-12-14 ENCOUNTER — Other Ambulatory Visit: Payer: Self-pay

## 2020-12-14 ENCOUNTER — Encounter: Payer: Self-pay | Admitting: Internal Medicine

## 2020-12-14 VITALS — BP 118/64 | HR 71 | Temp 97.9°F | Ht 62.0 in | Wt 152.4 lb

## 2020-12-14 DIAGNOSIS — J8283 Eosinophilic asthma: Secondary | ICD-10-CM | POA: Diagnosis not present

## 2020-12-14 DIAGNOSIS — J4540 Moderate persistent asthma, uncomplicated: Secondary | ICD-10-CM | POA: Diagnosis not present

## 2020-12-14 DIAGNOSIS — Z23 Encounter for immunization: Secondary | ICD-10-CM | POA: Diagnosis not present

## 2020-12-14 DIAGNOSIS — Z7185 Encounter for immunization safety counseling: Secondary | ICD-10-CM | POA: Diagnosis not present

## 2020-12-14 LAB — NITRIC OXIDE: Nitric Oxide: 25

## 2020-12-14 NOTE — Progress Notes (Signed)
OV 11/30/2016  Chief Complaint  Patient presents with   Follow-up    Pt here after acute visit with TP on 8.31.2018. Pt states she is feeling improved but not back to baseline. Pt states she is still having prod cough with pale yellow mucus, still some SOB. Pt denies CP/tightness, f/c/s.      75 year old female with moderate persistent asthma. Last seen 11/25/2013 by her practitioner. Given prednisone and Z-Pak. Currently she is improved. Pulmonary function test yesterday shows normalized FEV1 and DLCO. Nitric oxide was high 3 weeks ago. She is here with [name]. Her son has graduated from anesthesia residency at the [employer]. She is worried about repeated it takes of steroids. She admitted to noncompliance with Symbicort and Singulair up until May 2018. She is also worried about her monoclonal antibody infusion for multiple sclerosis which she gets every 6 months for the last year or 2. She says after that she gets extremely fatigued and might require a prednisone burst. Occasionally she is not a respiratory infection. However she's been advised by her neurologist to continue with these injections in order to support her multiple sclerosis. There are no other new issues. She had a 7 mm lung nodule that resolved a year ago  75 year old female never smoker followed for moderate persistent asthma and lung nodule. MS on Infusion -OCREVUS every 6 months  Son is an anesthesiologist    01/13/2017 Follow up : Asthma  Pt returns for follow up for Asthma . She was seen on 12/27/16 for asthma bronchitic flare with 3 week hx of productive cough with thick yellow mucus and wheezing . Her FENO testing was elevated . She was treated with Zpack and Prednisone taper. Says she did get better with less cough, congestion and wheezing . She is concerned because she gets intermittent wheezing especially in am. Goes away after she uses Symbicort.  No fever, discolored mucus, chest pain,  orthopnea,.  She remains on Symbicort, Singulair and Zyrtec. Says she is compliant .  She says she and family are concerned regarding steroids use. We went over steroid use and potential side effects and goal is to limit use as much as possible . Last ov Nucala was ordered and is under approval process . This was added to help with improved asthma control .  She is concerned for adrenal insufficiency , discussed that it can occur with prolonged steroid use. She has been on three 1 week steroid tapers over last 4 months . Suggested she discuss this with PCP to have additional testing or referral to endocrinology if indicated.  FENO testing today is slightly improved at 150 .     Acute 01/27/17 75 year old with history of persistent asthma, multiple sclerosis on  complains of increasing dyspnea for the past 1 week associated with yellow mucus, wheezing, chest tightness.  She denies any nebulizer but has not received them yet.  Paperwork is underway for initiation of nucala but has not been started yet.   She has significant history of GERD with esophageal spasm, atypical chest pain.  She follows at [facility] for this.  She is taking Protonix alternating with Zyrtec.     OV 02/08/2017  Chief Complaint  Patient presents with   Follow-up    Pt last seen by PM on 11.2.2018 for an acute visit. Pt states she started to imporve but now is feeling like she is catching a cold again. Pt c/o prod cough with yellow mucus. Pt  deniee SOB, CP/tihgtness and f/c/s.      75 year old female with eosinophilic and allergic asthma.  Presents for follow-up.  Most recently seen January 27, 2017 by my colleague for asthma exacerbation.  She is on her last day of prednisone today.  She feels improved.  Her nitric oxide has been the lowest it has been in 2018 which is 115 ppb but still significantly elevated.  She feels she is catching a cold although she feels overall better.  She is on Symbicort schedule and Singulair  scheduled.  She is not on any inhaled anticholinergic.  She is awaiting her interleukin-5 receptor antibody treatment.  She is worried about taking this because of cross interference with the other monoclonal antibody that she takes for her multiple sclerosis.  We had a discussion on this.  I did tell her that the cross-reactive and side effect profile is unknown and the general unpredictability can be higher.  Despite this we thought that the overall risk benefit ratio is in favor of her taking her interleukin-5 receptor antibody for asthma.   TEST  CT chest 01/2016 7 mm nodule resolved, stable other nodules , improved tree in bud nodularity  09/2016 IgE 4, neg RAST  09/2016 Eosinophils 300-400  Feno >.150 10/2016 >187 12/27/16  PFT 11/2016 >nml  FEV 1 , no restriction or obstruction , nml DLCO   OV 04/19/2017  Chief Complaint  Patient presents with   Follow-up    has had 2 ED visit in December 2018 with asthma/bronchitis,no admission,feels like breathing at this time is better,she can tell improvement   Follow-up allergic asthma with eosinophilic asthma but normal IgE  She is here with [name] who runs a daycare for after school kids at [employer]  Her son is no longer at the [employer] but is now working in [place].  She tells me currently her asthma is stable.  She and her daughter are very categorical that the whole asthma started after she started taking monoclonal antibody infusions for her multiple sclerosis.   there is an infusion administered over 6 hours every 6 months.  She first started this infusion around November 2017.  She tells me that she has infusion reactions that start within a day or so of receiving the infusion.  These infusion reactions are characterized by body ache and fever and aggravation of past medical issues such as the meniscus.  She also has throat irritation and bronchospasm.  Then some 2 weeks later like clockwork she gets acute bronchitis which  is then treated as an asthma exacerbation.  She also has significant fatigue.  She says it takes her months to recover from this and she has a normal good 5 months before the cycle is repeated again.  Most recently the infusion was in November 2018 and then she ended up in the ER for asthma exacerbation in December 2018.  She was supposed to start interleukin-5 receptor antibody for asthma that was poorly controlled and because of persistent eosinophilia and high nitric oxide but given the cycle she and her daughter have decided against Nucala especially because she is convinced it is the [place] for multiple sclerosis that is causing this problem.  She is going to skip her next multiple sclerosis infusion in May 2019.  At this point in time she is compliant with her Symbicort.  Med review shows coreg   OV 05/10/2017  Chief Complaint  Patient presents with   Acute Visit  Pt has complaints of coughing with thick yellow mucus and occ. dark brown mucus along with wheezing and hoarseness. Pt denies any fever.   32 -year-old female with asthma eosinophilic and moderate persistent on Symbicort.  She presents for an acute visit.  Since seeing me 3 weeks ago she had gradual deterioration in symptoms.  She is having significant nocturnal symptoms.  Her friends are telling her that her voice is hoarse.  She says she is compliant with her Symbicort and Singulair.  Her interleukin-5 receptor antibody is on hold because she is worried about interactions with the monoclonal antibody for multiple sclerosis still in her system and the washout of that is expected only June 2019.  Currently her asthma control questionnaire shows a score of 3.6 showing significant symptoms.  She is waking up a few times at night because of asthma symptoms and when she wakes up she has moderate symptoms.  She is moderately limited in her activities because of asthma.  She is short of breath quite a bit and wheezing all the time although  she hold off on using her albuterol for rescue.  Med review shows that she is on nonspecific beta blocker carvedilol.  There is no fever or chills.    OV 06/08/2017  Chief Complaint  Patient presents with   Follow-up    Pt has had problems with side effects from the spiriva due to eye problems and dry mouth.  Breo and spiriva had helped with the wheezing but due to side effects, meds were stopped x2 days. States she had a coughing fit with the meds as well. Pt has c/o cough with yellow mucus, SOB, and is back to wheezing.   75 year old female with asthma eosinophilic and moderate persistent.  At last visit she was in acute visit.  I added Spiriva to her regimen.  Switch to Symbicort 2 higher dose Brio.  I asked her to continue her Singulair.  She still was not interested in her biologic therapy given her issues doing biologic therapy with multiple sclerosis.  We also had her talk to her physician and get her nonspecific beta-blocker carvedilol switched out.  She did all this.  However she tells me that Spiriva is causing dry mouth and she stopped it like a week ago.  For some unclear reason she also stopped the Brio for a few days a week ago and then again for a few days earlier this week and then starting yesterday or like 2 days ago started having worsening asthma symptoms.  She is now reporting significant cough.  When she wakes up she has mild symptoms when she is active she is very slightly limited because of her asthma.  She is short of breath a moderate amount because of the asthma and is wheezing a moderate amount because of the asthma and in the last 2 days most of the time and she is now using nebulizer for rescue 2 times daily.  Average asthma control question as 2.2.  Her exact nitric oxide test continues to be elevated at 156 as always.  She is now accepting that she will have to go through biologic therapy.  Her daughter status with her.  The questioning alternative etiologies.  She tells me  that she might have mold in the house.  Review of the labs show she had normal allergy blood test in July 2018 and a clear chest x-ray in December 2018 [CT chest in November showed tree-in-bud in the right upper lobe 1  month earlier] IgE was normal back in July 2018.  Aspergillus panel never checked.   07/06/2017  Pt. Was seen for an acute flare 06/08/2017 after stopping her Breo. At that visit, Dr. Golden Pop plan was as follows: Plan Retest for ABPA             - check IgE, aspergillus antibody blood test , do CXR Take prednisone 40 mg daily x 2 days, then '20mg'$  daily x 2 days, then '10mg'$  daily x 2 days, then '5mg'$  daily x 2 days and stop Start dulera high dose - you need to be on inhaled steroids No spiriva for you Continue singulair Glad coreg got changed out Start Nucala starting next week FENO at this visit was 156 ppb   Followup 4-6 weeks or sooner if needed with myself or APP             - feno and ACQ at follow up  Pt. Presents today . She states she completed he prednisone taper . She has been compliant with her Dulera, Singulair and Zyrtec. She has not had to use her rescue inhaler. She is not coughing. She states maybe once daily. Her cough is productive for yellow secretions on the occasion she does cough. She has had 1 Nucala injection.She did have some fatigue 1 week after the injection. It lasted x 1 day. She did have some mild arm tenderness one week after the injection. She does feel she is better than she was last month.She denies fever, chest pain, orthopnea or hemoptysis.     OV 08/08/2017  Chief Complaint  Patient presents with   Follow-up    Pt states she is coughing up a lot of yellow mucus in the mornings. Other than the cough, pt states she has been doing good.    Chantel Norr presents for follow-up.  She has severe eosinophilic asthma that is poorly controlled with persistently significant high elevated nitric oxide levels  I personally saw her in mid March 2019.   At that time I recommended starting nucala.  She is now had 2 doses 4 weeks apart of that same injection.  Today is the third dose.  She says for the last few weeks she is noticing increased sputum production and loosening of the sputum and a yellow color in the sputum.  She feels that it is because the injection is working.  She does not feel it is the asthma exacerbation.  Her exam nitric oxide continues to be very high at 162 today.  She says she is compliant with her Dulera.  In terms of her multiple sclerosis she is coming off Ocrivis and going to sart on TYSABRI.  the care for this is being coordinated by Dr. Gorden Harms.  And apparently the neurologist was wondering about she says that between the last visit in this current visit she got her immunoglobulin checked by him and all these levels were low the correlation of that and asthma.  With in our labs we do notice that the IgE levels are low.  Currently she is not running any fever or chills or orthopnea or active wheezing   OV 10/16/2017  Chief Complaint  Patient presents with   Follow-up    Pt states she has been doing well since last OV with Wyn Quaker, NP but states she is still coughing up phlegm.    [name] presents for follow-up.  She has severe persistent asthma with eosinophila asthma that is poorly controlled with persistently significant  high elevated nitric oxide levels usually > 150. On Nucala since mid-march 2019   Since I saw her last 2 months ago she's had an exacerbation requiring prednisone. She continues on interleukin-5 receptor antibody subcutaneous injections every 4 weeks since mid March 2019. Currently she feels better than before asthma control questionnaire is1.2 and is on the better score she's had over time.However nitric oxide test is significantly elevated and worse and now in the 200s. She is very puzzled by this. She denies anyone infestation. Most recent eosinophil count to show improvement. But she still  overall very symptomatic and still requiring prednisone. We discussed the switch to another biologic dupulimab and she is open to this idea    OV 11/30/2017  Subjective:  Patient ID: [name], female , DOB: Jan 05, 1946 , age 64 y.o. , MRN: [id number] , ADDRESS: [facility] [id number]   11/30/2017 -   Chief Complaint  Patient presents with   Follow-up    Pt states she has been doing good since last visit except states she has had a headache x2 days and has had a cough. Pt also has c/o chest tightness.     HPI [name] 75 y.o. -follow-up severe persistent asthma with poorly controlled symptoms. She is on Dulera, Singulair and biologic Nucala. She reports that currently it is one of her better days and weeks. Despite that asthma control questionnaire shows significant symptom with the 5 point score of 1.8. She says she is not waking up in the middle of the night because of asthma when she wakes up she is very mild symptoms when she is very slightly limited in her activities and she is moderately short of breath and wheezing a lot of the time but not using much albuterol for rescue. This is despite compliance. Last visit we switched her to [place] but this is stuck with insurance pre-authorization required and that is some delays with this. In addition she also tells me that at every visit she's been giving sputum samples at our lab basementand she is unclear why no one called her with results. I have told her that I was unaware that this was going on. Review of the charts indicate sputum samples given October 2018 in April 2019. Along and consistent with a respiratory in asthma symptoms at that early morning she has cough with yellow sputum which is consistent with high airway eosinophil load as evidenced by significantly high exhaled nitric oxide is despite biologic therapy.  [name]  ROS - per HPI       OV 03/01/2018  Subjective:  Patient ID: NIYATHI BHULLAR, female ,  DOB: 1946-03-04 , age 81 y.o. , MRN: [id number] , ADDRESS: [facility] [id number]   03/01/2018 -   Chief Complaint  Patient presents with   Follow-up    f/u asthma, no wheezing since dupixent     HPI [name] 75 y.o. -  Follow-up severe eosinophilic asthma on Dupixent.  She is taking the shots in her arm every 2 weeks.  This is been going on for 8 weeks or so.  She reports excellent improvement in her asthma.  Asthma control questionnaire 0 out of 5.  Significant improvement in her exam nitric oxide today going from the mid 100s to 43.  She continues to be on Dulera and Singulair.  She is asking if she can de-escalate any of this therapy.  She is not waking up in the middle of  the night with asthma.  She not having any symptoms when she wakes up.  Not limited in her activities.  When she wakes up she not short of breath.  No wheezing.  She not using albuterol for rescue.  She thinks she might be getting a cold but she is not sure.  Nitric oxide and symptom profile shows huge improvements.  However she is wondering if she is having side effects from the dupilumab.  She is reporting some blurred vision and needing to use reading glasses more so after starting the dupilumab.  This is temporally correlated.  Review of the literature shows conjunctivitis and pruritus reported at low percentages but not blurred vision.  OV 05/31/2018  Subjective:  Patient ID: [name], female , DOB: [birth year] , age 40 y.o. , MRN: [id number] , AD[address]ESS: [place] [facility] [id number]   05/31/2018 -   Chief Complaint  Patient presents with   Follow-up    Pt states she has been doing okay since last visit and denies any current complaints of cough, SOB, or CP     HPI DARYAH KLAMM 75 y.o. -presents for follow-up.  She has severe asthma with eosinophilia.  Finally she is on Dupixent/in December 2019 she had asthma exacerbation and ended up in the ER and needed prednisone but after  that she is been doing well.  Asthma control question is 0.  She not waking up in the middle of the night with symptoms when she wakes up she is asymptomatic.  No limitations in activities no albuterol rescue use.  No wheezing no shortness of breath no cough.  Asthma control question is 0.  Exam nitric oxide is further improved to 22.  However she wants to stop the Dupixent because she feels she is getting a brain fog although she states that it could be related to [place].  She also wants to start her MS medications and is worried about the effects of polypharmacy.  She also discussed because of the upcoming coronavirus 19 pandemic.           OV 10/14/2019  Subjective:  Patient ID: [name], female , DOB: [birth year] , age 19 y.o. , MRN: [id number] , ADDRESS: [facility] [id number]   10/14/2019 -   Chief Complaint  Patient presents with   Follow-up    Eosinophilic moderate to severe persistent asthma HPI SIDDA KEIMIG 75 y.o. -presents for follow-up.  I last saw her March 2020.  Then with the pandemic I did not see her.  She has been coming to the office and getting her Dupixent twice weekly.  She is on the higher dose.  She is not using any inhaled steroid.  She wants to know if she can reduce the dose of the Dupixent.  She is also on Zyrtec and Singulair.  She wants to know if she can stop this.  Asthma is completely under control and she is asymptomatic.        ROS  OV 12/14/2020  Subjective:  Patient ID: [name], female , DOB: [birth year] , age 25 y.o. , MRN: [id number] , ADDRESS: [place] [id number] PCP [name], MD Patient Care Team: [name], MD as PCP - General (Internal Medicine) Troy Sine, MD as PCP - Cardiology (Cardiology)  This Provider for this visit: Treatment Team:  Attending Provider: Brand Males, MD    12/14/2020 -   Chief Complaint  Patient presents with  Follow-up    Pt states she has been  doing okay since last visit. States she has had an occ dry cough which lasted about 10 days but then went away.    Eosinophilic moderate to severe persistent asthma  HPI [name] 75 y.o. -last sseen by me July 2021 and then NP in OCt 2021. Asthma under excellent control based on ACT and feno. If she cuts grass or trimes bushes she gets cough. Has randon cough episodes occasionally. Otherwise well . Currentlyt on dupixent alone as single drug for asthma.  Discussed high dose flu shot and she will have it. Discussed covid mRNA bivalent booster -r ecommended she have it. Never had covid before        Ref. Range 10/07/2016 12:29 05/10/2017  06/08/2017 Start nucala 08/08/2017  10/16/2017   11/30/2017  03/01/2018 On dupixemen 05/31/2018  01/14/20  Stopped singulair in July, reduced dupixent doe july 12/14/2020 Using ACT from no won  Nitric Oxide Unknown '180 153 156 162 237 158 43 22 32 25 '$  ACQ score through march 2020 and then ACTfrom oct 2021   3.6   2.2  1.2 1.8 0 0 24      Ref. Range 05/10/2009 04:32 10/07/2016 13:11 10/07/2016 20:51 03/19/2017 15:45 08/08/2017 11:32  Eosinophils Absolute Latest Ref Range: 0.0 - 0.7 K/uL 0.1 0.4 0.3 0.3 0.0    Ref. Range 10/07/2016 13:11  IgEcoming (Immunoglobulin E), Serum Latest Ref Range: <115 kU/L 4    Asthma Control Test ACT Total Score  12/14/2020 25  01/14/2020 24     Lab Results  Component Value Date   NITRICOXIDE 25 12/14/2020     PFT  PFT Results Latest Ref Rng & Units 11/29/2016  FVC-Pre L 2.62  FVC-Predicted Pre % 90  FVC-Post L 2.50  FVC-Predicted Post % 86  Pre FEV1/FVC % % 79  Post FEV1/FCV % % 80  FEV1-Pre L 2.08  FEV1-Predicted Pre % 95  FEV1-Post L 2.00  DLCO uncorrected ml/min/mmHg 20.03  DLCO UNC% % 87  DLCO corrected ml/min/mmHg 19.74  DLCO COR %Predicted % 86  DLVA Predicted % 91  TLC L 5.05  TLC % Predicted % 103  RV % Predicted % 111       has a past medical history of Anemia, Anginal pain (HCC),  Arthritis, Complication of anesthesia, Coronary artery disease, Dry eyes, Esophageal spasm, GERD (gastroesophageal reflux disease), and MS (multiple sclerosis) ([place]).   reports that she has never smoked. She has never used smokeless tobacco.  Past Surgical History:  Procedure Laterality Date   ABDOMINAL HYSTERECTOMY     BLEPHAROPLASTY Bilateral    CARDIAC CATHETERIZATION  10/04/06   MINOR CAD,SINGLE VESSEL INVOLVING THE CIRCUMFLEX. 20 TO 30% PROXIMALLY AND 10 TO 20% IN THE MIDDLE SEGMENT.MILD MUSCLE BRIDGING, MID LAD.NORMAL RCA.NORMAL LV FUNCTION.NORMAL MITRAL AND AORTIC VALVE.NORMAL APPEARING AORTA,THORACIC AND ABDOMINAL.NORMAL RENAL ARTERIES.   CARDIOLOGY NUCLEAR MED STUDY  06/22/12   NL LV FUNCTION,EF 68%,NL WALL MOTION.   CAROTID DUPLEX  07/02/11   [id number] SOFT PLAQUE NOTED DISTAL CCA AND ORGIN AND PROXIMAL ICA,LEFT>RIGHT.NO ICA STENOSIS. VERTEBRAL ARTERY FLOW IS ANTEGRADE.   CESAREAN SECTION     x2    COLONOSCOPY WITH PROPOFOL N/A 12/11/2014   Procedure: COLONOSCOPY WITH PROPOFOL;  Surgeon: Ronald Lobo, MD;  Location: [facility];  Service: Endoscopy;  Laterality: N/A;   KNEE ARTHROSCOPY Left    scope   PARATHYROIDECTOMY     partial-many years ago   thumb surgery Bilateral  built up and bone removal   TONSILLECTOMY     TRANSTHORACIC ECHOCARDIOGRAM  07/02/11   LV CAVITY SIZE IS NORMAL. SYSTOLIC FUNCTION WAS NORMAL.EF=55% TO 60%.INCREASED RELATIVE CONTRIBUTION OF ATRIAL CONTRACTION TO VENTRICULAR FILLING;MAYBE DUE TO HYPOVOLEMIA. AV=MILD REGURG.    Allergies  Allergen Reactions   Crestor [Rosuvastatin] Other (See Comments)    Joint pain    Iodinated Diagnostic Agents Other (See Comments)    Sneezing and itchy throat; dye was Isovue 300   Vicodin [Hydrocodone-Acetaminophen]     Made pass-out one time and is okay taking now   Isovue [Iopamidol]     Pt had sneezing and itching of her throat and soft palate.  Dr Alvester Chou checked pt.  She will need premeds in the future.  J Bohm     Immunization History  Administered Date(s) Administered   Fluad Quad(high Dose 65+) 11/27/2018, 12/19/2019, 12/14/2020   Influenza, High Dose Seasonal PF 12/22/2013, 12/14/2015, 12/14/2016, 11/30/2017   Influenza,inj,Quad PF,6+ Mos 11/27/2014   PFIZER(Purple Top)SARS-COV-2 Vaccination 04/16/2019, 05/06/2019, 08/06/2020   Pneumococcal Polysaccharide-23 10/26/2013   Pneumococcal-Unspecified 10/26/2013   Tdap 06/14/2011   Zoster Recombinat (Shingrix) 11/13/2017, 02/08/2018    Family History  Problem Relation Age of Onset   Heart disease Mother    Asthma Father    Bladder Cancer Father    Heart failure Father    Hyperlipidemia Brother      Current Outpatient Medications:    albuterol (PROAIR HFA) 108 (90 Base) MCG/ACT inhaler, INHALE 1-2 PUFFS INTO THE LUNGS EVERY SIX HOURS AS NEEDED FOR WHEEZING OR SHORTNESS OF BREATH., Disp: 8.5 g, Rfl: 2   carbamazepine (TEGRETOL) 200 MG tablet, Take 200 mg by mouth 2 (two) times daily between meals as needed., Disp: , Rfl:    Cholecalciferol (VITAMIN D PO), Take 1 tablet by mouth daily., Disp: , Rfl:    denosumab (PROLIA) 60 MG/ML SOSY injection, 60 mg, Disp: , Rfl:    diazepam (VALIUM) 2 MG tablet, Take 2 mg by mouth every 6 (six) hours as needed for anxiety., Disp: , Rfl:    diltiazem (CARDIZEM CD) 240 MG 24 hr capsule, TAKE (1) CAPSULE DAILY., Disp: 90 capsule, Rfl: 1   DUPIXENT 200 MG/1.14ML prefilled syringe, INJECT '200MG'$  SUBCUTANEOUSLY EVERY OTHER WEEK, Disp: 2.28 mL, Rfl: 3   EPINEPHRINE 0.3 mg/0.3 mL IJ SOAJ injection, USE AS DIRECTED, Disp: 2 each, Rfl: 3   ezetimibe (ZETIA) 10 MG tablet, 1 tablet, Disp: , Rfl:    gabapentin (NEURONTIN) 300 MG capsule, Take 300 mg by mouth daily as needed. , Disp: , Rfl:    ipratropium-albuterol (DUONEB) 0.5-2.5 (3) MG/3ML SOLN, Take 3 mLs by nebulization 2 (two) times daily. (Patient taking differently: Take 3 mLs by nebulization 2 (two) times daily. Pt takes as needed), Disp: 360 mL, Rfl: 5    isosorbide mononitrate (IMDUR) 60 MG 24 hr tablet, TAKE 1&1/2 TABLETS ONCE DAILY., Disp: 135 tablet, Rfl: 3   losartan (COZAAR) 50 MG tablet, TAKE 1&1/2 TABLETS ONCE DAILY., Disp: 135 tablet, Rfl: 0   meclizine (ANTIVERT) 25 MG tablet, Take 1 tablet (25 mg total) by mouth 3 (three) times daily as needed for dizziness., Disp: 30 tablet, Rfl: 0   nitroGLYCERIN (NITROSTAT) 0.4 MG SL tablet, PLACE 1 TAB UNDER THE TONGUE AS NEEDED FOR CHEST PAIN MAY REPEAT EVERY 5 MINUTES., Disp: 25 tablet, Rfl: 3   pantoprazole (PROTONIX) 20 MG tablet, TAKE 1 TABLET BY MOUTH TWICE DAILY., Disp: 60 tablet, Rfl: 3   PARoxetine (PAXIL) 10 MG tablet,  Take 5 mg by mouth daily. , Disp: , Rfl:    Respiratory Therapy Supplies (FLUTTER) DEVI, Use as directed, Disp: 1 each, Rfl: 0   rosuvastatin (CRESTOR) 20 MG tablet, 1 tablet, Disp: , Rfl:   Current Facility-Administered Medications:    Mepolizumab SOLR 100 mg, 100 mg, Subcutaneous, Q28 days, [name], Seif Teichert, MD, 100 mg at 06/14/17 [id number]   Mepolizumab SOLR 100 mg, 100 mg, Subcutaneous, Q28 days, Brand Males, MD, 100 mg at 07/12/17 [id number]   Mepolizumab SOLR 100 mg, 100 mg, Subcutaneous, Q28 days, Brand Males, MD, 100 mg at 10/31/17 [id number]  Facility-Administered Medications Ordered in Other Visits:    0.9 %  sodium chloride infusion, , Intravenous, Once PRN, Tresa Res, RPH   albuterol (VENTOLIN HFA) 108 (90 Base) MCG/ACT inhaler 2 puff, 2 puff, Inhalation, Once PRN, Tresa Res, RPH   diphenhydrAMINE (BENADRYL) injection 50 mg, 50 mg, Intravenous, Once PRN, Tresa Res, RPH   EPINEPHrine (EPI-PEN) injection 0.3 mg, 0.3 mg, Intramuscular, Once PRN, Tresa Res, RPH   famotidine (PEPCID) IVPB 20 mg premix, 20 mg, Intravenous, Once PRN, Tresa Res, RPH   methylPREDNISolone sodium succinate (SOLU-MEDROL) 125 mg/2 mL injection 125 mg, 125 mg, Intravenous, Once PRN, Tresa Res, RPH      Objective:   Vitals:   12/14/20 1351  BP: 118/64  Pulse: 71  Temp:  97.9 F (36.6 C)  TempSrc: Oral  SpO2: 96%  Weight: 152 lb 6.4 oz (69.1 kg)  Height: '5\' 2"'$  (1.575 m)    Estimated body mass index is 27.87 kg/m as calculated from the following:   Height as of this encounter: '5\' 2"'$  (1.575 m).   Weight as of this encounter: 152 lb 6.4 oz (69.1 kg).  '@WEIGHTCHANGE'$ @  [employer]   12/14/20 1351  Weight: 152 lb 6.4 oz (69.1 kg)     Physical Exam    General: No distress. Look swell Neuro: Alert and Oriented x 3. GCS 15. Speech normal Psych: Pleasant Resp:  Barrel Chest - no.  Wheeze - no, Crackles - no, No overt respiratory distress CVS: Normal heart sounds. Murmurs - no Ext: Stigmata of Connective Tissue Disease - no HEENT: Normal upper airway. PEERL +. No post nasal drip        Assessment:       ICD-10-CM   1. Moderate persistent asthma, unspecified whether complicated  123456 Nitric oxide    2. Eosinophilic asthma  Q000111Q     3. Need for immunization against influenza  Z23 Flu Vaccine QUAD High Dose(Fluad)    4. Vaccine counseling  Z71.85          Plan:     Patient Instructions     ICD-10-CM   1. Moderate persistent asthma, unspecified whether complicated  123456   2. Eosinophilic asthma  Q000111Q      asthma is been under excellent control without any inhaler and without zyrtec and without singulair but just on Dupixent therapy  at the lower dose  Plan - Continue Dupixent to 2000 mg every 2 weeks   - will talk to some colleagues to see if there is a time frame where we can wean you off dupixent -Use albuterol as needed -Continue to use mask protocol despite vaccination status given the rise of  BA.5  - get high dose flu shot 12/14/2020  - get covid mRNA bivalent booster sometime in eary October 2022 (last 3rd dose of mRNA vaccine was in May 2022) - continue to masking and isolation protocols of  being a covid super dodger  Follow-up -6 months do exhaled nitric oxide testing = Return to see Dr [name] in 6 months  or sooner if needed  - ACT/FEno at follow-up    SIGNATURE    Dr. Brand Males, M.D., F.C.C.P,  Pulmonary and Critical Care Medicine Staff Physician, [place] Director - Interstitial Lung Disease  Program  Pulmonary [place] at [place], [place], [address]  Pager: [phone], If no answer or between  15:00h - 7:00h: call [phone] Telephone: [phone]  2:21 PM 12/14/2020

## 2020-12-14 NOTE — Patient Instructions (Addendum)
ICD-10-CM   1. Moderate persistent asthma, unspecified whether complicated  123456   2. Eosinophilic asthma  Q000111Q      asthma is been under excellent control without any inhaler and without zyrtec and without singulair but just on Dupixent therapy  at the lower dose  Plan - Continue Dupixent to 2000 mg every 2 weeks   - will talk to some colleagues to see if there is a time frame where we can wean you off dupixent -Use albuterol as needed -Continue to use mask protocol despite vaccination status given the rise of  BA.5  - get high dose flu shot 12/14/2020  - get covid mRNA bivalent booster sometime in eary October 2022 (last 3rd dose of mRNA vaccine was in May 2022) - continue to masking and isolation protocols of being a covid super dodger  Follow-up -6 months do exhaled nitric oxide testing = Return to see Dr [name] in 6 months or sooner if needed  - ACT/FEno at follow-up

## 2020-12-15 ENCOUNTER — Ambulatory Visit (INDEPENDENT_AMBULATORY_CARE_PROVIDER_SITE_OTHER): Payer: Medicare PPO

## 2020-12-15 VITALS — BP 119/68 | HR 77 | Temp 98.4°F | Resp 16

## 2020-12-15 DIAGNOSIS — J4540 Moderate persistent asthma, uncomplicated: Secondary | ICD-10-CM

## 2020-12-15 MED ORDER — METHYLPREDNISOLONE SODIUM SUCC 125 MG IJ SOLR: 125.0000 mg | Freq: Once | INTRAMUSCULAR | Status: DC | PRN

## 2020-12-15 MED ORDER — SODIUM CHLORIDE 0.9 % IV SOLN: Freq: Once | INTRAVENOUS | Status: DC | PRN

## 2020-12-15 MED ORDER — ALBUTEROL SULFATE HFA 108 (90 BASE) MCG/ACT IN AERS: 2.0000 | INHALATION_SPRAY | Freq: Once | RESPIRATORY_TRACT | Status: DC | PRN

## 2020-12-15 MED ORDER — DIPHENHYDRAMINE HCL 50 MG/ML IJ SOLN: 50.0000 mg | Freq: Once | INTRAMUSCULAR | Status: DC | PRN

## 2020-12-15 MED ORDER — FAMOTIDINE IN NACL 20-0.9 MG/50ML-% IV SOLN: 20.0000 mg | Freq: Once | INTRAVENOUS | Status: DC | PRN

## 2020-12-15 MED ORDER — EPINEPHRINE 0.3 MG/0.3ML IJ SOAJ: 0.3000 mg | Freq: Once | INTRAMUSCULAR | Status: DC | PRN

## 2020-12-15 MED ORDER — DUPILUMAB 200 MG/1.14ML ~~LOC~~ SOSY
200.0000 mg | PREFILLED_SYRINGE | Freq: Once | SUBCUTANEOUS | Status: AC
  Administered 2020-12-15: 200 mg via SUBCUTANEOUS
  Filled 2020-12-15: qty 2.28

## 2020-12-15 NOTE — Progress Notes (Signed)
Diagnosis: Asthma  Provider:  Marshell Garfinkel, MD  Procedure: Injection  Dupixent (Dupilumab), Dose: 200 mg, Site: subcutaneous  Discharge: Condition: Good, Destination: Home . AVS provided to patient.   Performed by:  Arnoldo Morale, RN

## 2020-12-21 ENCOUNTER — Ambulatory Visit: Payer: Medicare PPO

## 2020-12-22 ENCOUNTER — Ambulatory Visit: Payer: Medicare PPO

## 2020-12-25 ENCOUNTER — Ambulatory Visit: Payer: Medicare PPO

## 2020-12-25 ENCOUNTER — Telehealth: Payer: Self-pay | Admitting: Internal Medicine

## 2020-12-25 MED ORDER — PREDNISONE 10 MG PO TABS: ORAL_TABLET | ORAL | 0 refills | Status: AC

## 2020-12-25 MED ORDER — AZITHROMYCIN 250 MG PO TABS: ORAL_TABLET | ORAL | 0 refills | Status: DC

## 2020-12-25 NOTE — Telephone Encounter (Signed)
Call made to patient, confirmed DOB. Made aware of MR recommendations. Confirmed pharmacy. Medication sent. Nothing further needed at this time.

## 2020-12-25 NOTE — Telephone Encounter (Signed)
Patient called me . Sas c=roupy cough and bronchtis since 12/23/20 . Covid test negative. Wants meds calld into [place]  Plan  - z pak per her request  - Please take prednisone 40 mg x1 day, then 30 mg x1 day, then 20 mg x1 day, then 10 mg x1 day, and then 5 mg x1 day and stop (she will take this if she gets worse but ok to call oin given weather)

## 2020-12-29 ENCOUNTER — Ambulatory Visit (INDEPENDENT_AMBULATORY_CARE_PROVIDER_SITE_OTHER): Payer: Medicare PPO

## 2020-12-29 ENCOUNTER — Other Ambulatory Visit: Payer: Self-pay

## 2020-12-29 VITALS — BP 125/73 | HR 86 | Temp 97.9°F | Resp 18 | Ht 62.0 in | Wt 152.6 lb

## 2020-12-29 DIAGNOSIS — J4540 Moderate persistent asthma, uncomplicated: Secondary | ICD-10-CM | POA: Diagnosis not present

## 2020-12-29 MED ORDER — DUPILUMAB 200 MG/1.14ML ~~LOC~~ SOSY
200.0000 mg | PREFILLED_SYRINGE | Freq: Once | SUBCUTANEOUS | Status: AC
  Administered 2020-12-29: 200 mg via SUBCUTANEOUS
  Filled 2020-12-29: qty 2.28

## 2020-12-29 NOTE — Progress Notes (Signed)
Diagnosis: Moderate persistent asthma  Provider:  Marshell Garfinkel, MD  Procedure: Injection  Dupixent (Dupilumab), Dose: 200 mg, Site: subcutaneous  Discharge: Condition: Good, Destination: Home . AVS provided to patient.   Performed by:  Charlie Pitter, RN

## 2021-01-04 ENCOUNTER — Ambulatory Visit: Payer: Medicare PPO

## 2021-01-05 ENCOUNTER — Ambulatory Visit: Payer: Medicare PPO

## 2021-01-08 ENCOUNTER — Ambulatory Visit: Payer: Medicare PPO

## 2021-01-12 ENCOUNTER — Ambulatory Visit (INDEPENDENT_AMBULATORY_CARE_PROVIDER_SITE_OTHER): Payer: Medicare PPO | Admitting: *Deleted

## 2021-01-12 ENCOUNTER — Other Ambulatory Visit: Payer: Self-pay

## 2021-01-12 VITALS — BP 114/65 | HR 72 | Temp 97.7°F | Resp 20 | Ht 63.0 in | Wt 153.0 lb

## 2021-01-12 DIAGNOSIS — J4540 Moderate persistent asthma, uncomplicated: Secondary | ICD-10-CM

## 2021-01-12 MED ORDER — FAMOTIDINE IN NACL 20-0.9 MG/50ML-% IV SOLN: 20.0000 mg | Freq: Once | INTRAVENOUS | Status: DC | PRN

## 2021-01-12 MED ORDER — METHYLPREDNISOLONE SODIUM SUCC 125 MG IJ SOLR: 125.0000 mg | Freq: Once | INTRAMUSCULAR | Status: DC | PRN

## 2021-01-12 MED ORDER — SODIUM CHLORIDE 0.9 % IV SOLN: Freq: Once | INTRAVENOUS | Status: DC | PRN

## 2021-01-12 MED ORDER — ALBUTEROL SULFATE HFA 108 (90 BASE) MCG/ACT IN AERS: 2.0000 | INHALATION_SPRAY | Freq: Once | RESPIRATORY_TRACT | Status: DC | PRN

## 2021-01-12 MED ORDER — DUPILUMAB 200 MG/1.14ML ~~LOC~~ SOSY
200.0000 mg | PREFILLED_SYRINGE | Freq: Once | SUBCUTANEOUS | Status: AC
  Administered 2021-01-12: 200 mg via SUBCUTANEOUS
  Filled 2021-01-12: qty 2.28

## 2021-01-12 MED ORDER — DIPHENHYDRAMINE HCL 50 MG/ML IJ SOLN: 50.0000 mg | Freq: Once | INTRAMUSCULAR | Status: DC | PRN

## 2021-01-12 MED ORDER — EPINEPHRINE 0.3 MG/0.3ML IJ SOAJ: 0.3000 mg | Freq: Once | INTRAMUSCULAR | Status: DC | PRN

## 2021-01-12 NOTE — Progress Notes (Signed)
Diagnosis: Asthma  Provider:  Marshell Garfinkel, MD  Procedure: Injection  Dupixent (Dupilumab), Dose: 200 mg, Site: subcutaneous  Discharge: Condition: Good, Destination: Home . AVS provided to patient.   Performed by:  Oren Beckmann, RN

## 2021-01-18 ENCOUNTER — Ambulatory Visit: Payer: Medicare PPO

## 2021-01-19 ENCOUNTER — Ambulatory Visit: Payer: Medicare PPO

## 2021-01-22 ENCOUNTER — Ambulatory Visit: Payer: Medicare PPO

## 2021-01-26 ENCOUNTER — Other Ambulatory Visit: Payer: Self-pay

## 2021-01-26 ENCOUNTER — Ambulatory Visit (INDEPENDENT_AMBULATORY_CARE_PROVIDER_SITE_OTHER): Payer: Medicare PPO | Admitting: *Deleted

## 2021-01-26 VITALS — BP 139/75 | HR 87 | Temp 98.0°F | Resp 16 | Ht 63.0 in | Wt 151.4 lb

## 2021-01-26 DIAGNOSIS — J4540 Moderate persistent asthma, uncomplicated: Secondary | ICD-10-CM | POA: Diagnosis not present

## 2021-01-26 MED ORDER — DIPHENHYDRAMINE HCL 50 MG/ML IJ SOLN: 50.0000 mg | Freq: Once | INTRAMUSCULAR | Status: DC | PRN

## 2021-01-26 MED ORDER — METHYLPREDNISOLONE SODIUM SUCC 125 MG IJ SOLR: 125.0000 mg | Freq: Once | INTRAMUSCULAR | Status: DC | PRN

## 2021-01-26 MED ORDER — FAMOTIDINE IN NACL 20-0.9 MG/50ML-% IV SOLN: 20.0000 mg | Freq: Once | INTRAVENOUS | Status: DC | PRN

## 2021-01-26 MED ORDER — ALBUTEROL SULFATE HFA 108 (90 BASE) MCG/ACT IN AERS: 2.0000 | INHALATION_SPRAY | Freq: Once | RESPIRATORY_TRACT | Status: DC | PRN

## 2021-01-26 MED ORDER — SODIUM CHLORIDE 0.9 % IV SOLN: Freq: Once | INTRAVENOUS | Status: DC | PRN

## 2021-01-26 MED ORDER — EPINEPHRINE 0.3 MG/0.3ML IJ SOAJ: 0.3000 mg | Freq: Once | INTRAMUSCULAR | Status: DC | PRN

## 2021-01-26 MED ORDER — DUPILUMAB 200 MG/1.14ML ~~LOC~~ SOSY
200.0000 mg | PREFILLED_SYRINGE | Freq: Once | SUBCUTANEOUS | Status: AC
  Administered 2021-01-26: 200 mg via SUBCUTANEOUS
  Filled 2021-01-26: qty 2.28

## 2021-01-26 NOTE — Progress Notes (Signed)
Diagnosis: Asthma  Provider:  Marshell Garfinkel, MD  Procedure: Injection  Dupixent (Dupilumab), Dose: 200 mg, Site: subcutaneous  Discharge: Condition: Good, Destination: Home . AVS provided to patient.   Performed by:  Oren Beckmann, RN

## 2021-02-01 ENCOUNTER — Ambulatory Visit: Payer: Medicare PPO

## 2021-02-02 ENCOUNTER — Ambulatory Visit: Payer: Medicare PPO

## 2021-02-05 ENCOUNTER — Ambulatory Visit: Payer: Medicare PPO

## 2021-02-09 ENCOUNTER — Other Ambulatory Visit: Payer: Self-pay | Admitting: Cardiovascular Disease

## 2021-02-09 ENCOUNTER — Ambulatory Visit (INDEPENDENT_AMBULATORY_CARE_PROVIDER_SITE_OTHER): Payer: Medicare PPO

## 2021-02-09 ENCOUNTER — Other Ambulatory Visit: Payer: Self-pay

## 2021-02-09 VITALS — BP 123/71 | HR 73 | Temp 97.5°F | Resp 20 | Ht 62.5 in | Wt 152.4 lb

## 2021-02-09 DIAGNOSIS — J4540 Moderate persistent asthma, uncomplicated: Secondary | ICD-10-CM

## 2021-02-09 MED ORDER — ALBUTEROL SULFATE HFA 108 (90 BASE) MCG/ACT IN AERS: 2.0000 | INHALATION_SPRAY | Freq: Once | RESPIRATORY_TRACT | Status: DC | PRN

## 2021-02-09 MED ORDER — DUPILUMAB 200 MG/1.14ML ~~LOC~~ SOSY
200.0000 mg | PREFILLED_SYRINGE | Freq: Once | SUBCUTANEOUS | Status: AC
  Administered 2021-02-09: 200 mg via SUBCUTANEOUS
  Filled 2021-02-09: qty 2.28

## 2021-02-09 MED ORDER — FAMOTIDINE IN NACL 20-0.9 MG/50ML-% IV SOLN: 20.0000 mg | Freq: Once | INTRAVENOUS | Status: DC | PRN

## 2021-02-09 MED ORDER — EPINEPHRINE 0.3 MG/0.3ML IJ SOAJ: 0.3000 mg | Freq: Once | INTRAMUSCULAR | Status: DC | PRN

## 2021-02-09 MED ORDER — DIPHENHYDRAMINE HCL 50 MG/ML IJ SOLN: 50.0000 mg | Freq: Once | INTRAMUSCULAR | Status: DC | PRN

## 2021-02-09 MED ORDER — METHYLPREDNISOLONE SODIUM SUCC 125 MG IJ SOLR: 125.0000 mg | Freq: Once | INTRAMUSCULAR | Status: DC | PRN

## 2021-02-09 MED ORDER — SODIUM CHLORIDE 0.9 % IV SOLN: Freq: Once | INTRAVENOUS | Status: DC | PRN

## 2021-02-09 NOTE — Progress Notes (Addendum)
Diagnosis: Asthma  Provider:  Marshell Garfinkel, MD  Procedure: Injection  Dupixent (Dupilumab), Dose: 200 mg, Site: subcutaneous  Discharge: Condition: Good, Destination: Home . AVS provided to patient.   Performed by:  Koren Shiver, RN

## 2021-02-15 ENCOUNTER — Ambulatory Visit: Payer: Medicare PPO

## 2021-02-16 ENCOUNTER — Ambulatory Visit: Payer: Medicare PPO

## 2021-02-19 ENCOUNTER — Ambulatory Visit: Payer: Medicare PPO

## 2021-02-23 ENCOUNTER — Ambulatory Visit (INDEPENDENT_AMBULATORY_CARE_PROVIDER_SITE_OTHER): Payer: Medicare PPO | Admitting: *Deleted

## 2021-02-23 ENCOUNTER — Other Ambulatory Visit: Payer: Self-pay

## 2021-02-23 VITALS — BP 149/81 | HR 73 | Temp 98.1°F | Resp 16 | Ht 62.0 in | Wt 153.4 lb

## 2021-02-23 DIAGNOSIS — J4540 Moderate persistent asthma, uncomplicated: Secondary | ICD-10-CM

## 2021-02-23 MED ORDER — DUPILUMAB 200 MG/1.14ML ~~LOC~~ SOSY
200.0000 mg | PREFILLED_SYRINGE | Freq: Once | SUBCUTANEOUS | Status: AC
  Administered 2021-02-23: 200 mg via SUBCUTANEOUS
  Filled 2021-02-23: qty 2.28

## 2021-02-23 NOTE — Progress Notes (Addendum)
Diagnosis: Asthma  Provider:  Marshell Garfinkel, MD  Procedure: Injection  Dupixent (Dupilumab), Dose: 200 mg, Site: subcutaneous  Discharge: Condition: Good, Destination: Home . AVS provided to patient.   Performed by:  Oren Beckmann, RN

## 2021-02-24 ENCOUNTER — Telehealth: Payer: Self-pay | Admitting: Pharmacy Technician

## 2021-02-24 DIAGNOSIS — J4540 Moderate persistent asthma, uncomplicated: Secondary | ICD-10-CM

## 2021-02-24 NOTE — Telephone Encounter (Signed)
PA RENEWAL  Auth Submission: PENDING Payer: [employer] Medication & CPT/J Code(s) submitted:  [place] [id number] Route of submission (phone, fax, portal): COVER MY MEDS Auth type:  Units/visits requested: 200 MG SYG Q2WKS Reference number: KEYRacheal Patches PA ID [id number]  Will f/u once I receive response

## 2021-02-26 NOTE — Telephone Encounter (Addendum)
Auth Follow-up: DENIED (PA Renewal) Payer: [place] of follow up: portal/phone/fax: COVER MY MED Reference number: [id number] Medication & CPT/J Code(s) : DUPIXENT Units/visits requested: 200 MG SYG Status: DENIED  Authorization has been DENIED because patient is not currently on a long-acting beta-2 agonist along with the [place]  Will need to file appeal or please advise if patient needs new therapy???

## 2021-02-26 NOTE — Telephone Encounter (Signed)
ATC patient to review - unable to reach. Left VM requesting return call to discuss Dupixent coverage. Will f/u  Knox Saliva, PharmD, MPH, BCPS Clinical Pharmacist (Rheumatology and Pulmonology)

## 2021-02-26 NOTE — Telephone Encounter (Signed)
Pls talk to [name] and explain situation and get inhaler added on and get the dupixent. She was miserable without biologic

## 2021-03-01 ENCOUNTER — Ambulatory Visit: Payer: Medicare PPO

## 2021-03-01 ENCOUNTER — Other Ambulatory Visit: Payer: Self-pay | Admitting: Internal Medicine

## 2021-03-01 DIAGNOSIS — J4540 Moderate persistent asthma, uncomplicated: Secondary | ICD-10-CM

## 2021-03-01 NOTE — Telephone Encounter (Signed)
Called patient and spoke with her about requirement for maintenance inhaler. She is amenable to restarting. She will go home and check which maintenance inhaler she was last taking and call me back directly  Knox Saliva, PharmD, MPH, BCPS Clinical Pharmacist (Rheumatology and Pulmonology)

## 2021-03-01 NOTE — Telephone Encounter (Signed)
Patient does not have any maintenance inhaler at home. She states that she remembers being on [facility] but does not know if Ruthe Mannan was stopped and switched to any other maintenance. Per chart review, do not see that patient was switched. She is amenable to restarting Dulera.  Knox Saliva, PharmD, MPH, BCPS Clinical Pharmacist (Rheumatology and Pulmonology)

## 2021-03-02 ENCOUNTER — Ambulatory Visit: Payer: Medicare PPO

## 2021-03-02 MED ORDER — MOMETASONE FURO-FORMOTEROL FUM 100-5 MCG/ACT IN AERO
2.0000 | INHALATION_SPRAY | Freq: Two times a day (BID) | RESPIRATORY_TRACT | 5 refills | Status: DC
Start: 2021-03-02 — End: 2021-03-05

## 2021-03-02 NOTE — Addendum Note (Signed)
Addended by: Cassandria Anger on: 03/02/2021 12:57 PM   Modules accepted: Orders

## 2021-03-02 NOTE — Telephone Encounter (Signed)
I ran into her on the streeet by cooincidence. She is ok restarting dulera. The 100 stregnth

## 2021-03-02 NOTE — Telephone Encounter (Signed)
Rx for Dulera 100mg  (2 puffs twice daily) sent to [facility] back to [place], PharmD, MPH, BCPS Clinical Pharmacist (Rheumatology and Pulmonology)

## 2021-03-02 NOTE — Telephone Encounter (Signed)
Devki,  I will try to ammend the auth first and if no results we may need to appeal.

## 2021-03-03 ENCOUNTER — Telehealth: Payer: Self-pay | Admitting: Pharmacy Technician

## 2021-03-03 ENCOUNTER — Other Ambulatory Visit (HOSPITAL_COMMUNITY): Payer: Self-pay

## 2021-03-03 ENCOUNTER — Encounter: Payer: Self-pay | Admitting: Internal Medicine

## 2021-03-03 DIAGNOSIS — J4540 Moderate persistent asthma, uncomplicated: Secondary | ICD-10-CM

## 2021-03-03 NOTE — Telephone Encounter (Signed)
Patient Advocate Encounter   PA submitted on 03/03/21 Key [id number] Status is pending    [facility] will continue to follow:  Patient Advocate Fax:  [phone]

## 2021-03-03 NOTE — Telephone Encounter (Signed)
Patient Advocate Encounter   Received notification from CoverMyMeds that prior authorization for [name] is required by his/her insurance [employer].  Per Test Claim: generic Advair doesn't require PA and could be more cost effective for the pt. Symbicort is also covered, but the test claim was a little more than the generic Advair

## 2021-03-03 NOTE — Telephone Encounter (Signed)
Devki,  F/u: Dupixent denied due to patient has not started long acting inhaler.  We will need to file APPEAL for [facility].  Inquired about starting a new PA with updated information and we must wait 60 days to start a NEW PA.  Can not amend PA once it has been denied.  Plan: Patient has started [facility].  Maudie Mercury

## 2021-03-04 NOTE — Telephone Encounter (Signed)
Fax received stating that unfortunately your coverage or payment under your Medicare Part D benefit for Dulera 153mg was denied.  How decision was made:  We cover this drug when criteria is met and the unmet criteria was: has tried or cannot use one of the following: Advair Diskus/HFA (brand or generic), or Symbicort HFA.

## 2021-03-04 NOTE — Telephone Encounter (Signed)
Symbicort 80/4.5 at 2 puff bid is fine

## 2021-03-04 NOTE — Telephone Encounter (Signed)
Please see note and reason for denial.  Would you prescribe either of these medications or can you provide specific, detailed clinical information/rationale of your patient's health status to address their denial reasons, so that I can submit an appeal?

## 2021-03-05 DIAGNOSIS — E7801 Familial hypercholesterolemia: Secondary | ICD-10-CM | POA: Diagnosis not present

## 2021-03-05 MED ORDER — BUDESONIDE-FORMOTEROL FUMARATE 80-4.5 MCG/ACT IN AERO: 2.0000 | INHALATION_SPRAY | Freq: Two times a day (BID) | RESPIRATORY_TRACT | 12 refills | Status: DC

## 2021-03-05 NOTE — Addendum Note (Signed)
Addended by: Lorretta Harp on: 03/05/2021 08:22 AM   Modules accepted: Orders

## 2021-03-05 NOTE — Telephone Encounter (Signed)
Called and spoke with pt letting her know the info about the PA and that we were switching her from [facility] to Symbicort and she verbalized understanding. Rx for Symbicort has been sent to preferred pharmacy.nothing further needed.

## 2021-03-09 ENCOUNTER — Ambulatory Visit (INDEPENDENT_AMBULATORY_CARE_PROVIDER_SITE_OTHER): Payer: Medicare PPO

## 2021-03-09 ENCOUNTER — Other Ambulatory Visit: Payer: Self-pay

## 2021-03-09 VITALS — BP 134/77 | HR 73 | Temp 97.7°F | Resp 16 | Ht 63.0 in | Wt 152.6 lb

## 2021-03-09 DIAGNOSIS — M791 Myalgia, unspecified site: Secondary | ICD-10-CM | POA: Diagnosis not present

## 2021-03-09 DIAGNOSIS — I10 Essential (primary) hypertension: Secondary | ICD-10-CM | POA: Diagnosis not present

## 2021-03-09 DIAGNOSIS — J4540 Moderate persistent asthma, uncomplicated: Secondary | ICD-10-CM

## 2021-03-09 DIAGNOSIS — I2510 Atherosclerotic heart disease of native coronary artery without angina pectoris: Secondary | ICD-10-CM | POA: Diagnosis not present

## 2021-03-09 DIAGNOSIS — T466X5A Adverse effect of antihyperlipidemic and antiarteriosclerotic drugs, initial encounter: Secondary | ICD-10-CM | POA: Diagnosis not present

## 2021-03-09 DIAGNOSIS — E7800 Pure hypercholesterolemia, unspecified: Secondary | ICD-10-CM | POA: Diagnosis not present

## 2021-03-09 DIAGNOSIS — I700 Atherosclerosis of aorta: Secondary | ICD-10-CM | POA: Diagnosis not present

## 2021-03-09 MED ORDER — EPINEPHRINE 0.3 MG/0.3ML IJ SOAJ: 0.3000 mg | Freq: Once | INTRAMUSCULAR | Status: DC | PRN

## 2021-03-09 MED ORDER — DIPHENHYDRAMINE HCL 50 MG/ML IJ SOLN: 50.0000 mg | Freq: Once | INTRAMUSCULAR | Status: DC | PRN

## 2021-03-09 MED ORDER — SODIUM CHLORIDE 0.9 % IV SOLN: Freq: Once | INTRAVENOUS | Status: DC | PRN

## 2021-03-09 MED ORDER — DUPILUMAB 200 MG/1.14ML ~~LOC~~ SOSY
200.0000 mg | PREFILLED_SYRINGE | Freq: Once | SUBCUTANEOUS | Status: AC
  Administered 2021-03-09: 200 mg via SUBCUTANEOUS
  Filled 2021-03-09: qty 2.28

## 2021-03-09 MED ORDER — FAMOTIDINE IN NACL 20-0.9 MG/50ML-% IV SOLN: 20.0000 mg | Freq: Once | INTRAVENOUS | Status: DC | PRN

## 2021-03-09 MED ORDER — METHYLPREDNISOLONE SODIUM SUCC 125 MG IJ SOLR: 125.0000 mg | Freq: Once | INTRAMUSCULAR | Status: DC | PRN

## 2021-03-09 MED ORDER — ALBUTEROL SULFATE HFA 108 (90 BASE) MCG/ACT IN AERS: 2.0000 | INHALATION_SPRAY | Freq: Once | RESPIRATORY_TRACT | Status: DC | PRN

## 2021-03-09 NOTE — Progress Notes (Signed)
Diagnosis: Asthma  Provider:  Marshell Garfinkel, MD  Procedure: Infusion  Dupixent (Dupilumab), Dose: 200 mg, Site: subcutaneous, Number of injections: 1  Discharge: Condition: Good, Destination: Home . AVS provided to patient.   Performed by:  Arnoldo Morale, RN

## 2021-03-12 NOTE — Telephone Encounter (Signed)
Submitted an URGENT appeal to [facility] for [place].  Reference # [id number] Phone: [phone] Fax: [phone]  Knox Saliva, PharmD, MPH, BCPS Clinical Pharmacist (Rheumatology and Pulmonology)

## 2021-03-15 ENCOUNTER — Ambulatory Visit: Payer: Medicare PPO

## 2021-03-16 ENCOUNTER — Ambulatory Visit: Payer: Medicare PPO

## 2021-03-23 ENCOUNTER — Other Ambulatory Visit: Payer: Self-pay | Admitting: *Deleted

## 2021-03-23 ENCOUNTER — Telehealth (INDEPENDENT_AMBULATORY_CARE_PROVIDER_SITE_OTHER): Payer: Medicare PPO | Admitting: Internal Medicine

## 2021-03-23 ENCOUNTER — Telehealth: Payer: Self-pay | Admitting: Internal Medicine

## 2021-03-23 ENCOUNTER — Ambulatory Visit: Payer: Medicare PPO

## 2021-03-23 DIAGNOSIS — R051 Acute cough: Secondary | ICD-10-CM | POA: Diagnosis not present

## 2021-03-23 LAB — POCT INFLUENZA A/B
Influenza A, POC: NEGATIVE
Influenza B, POC: NEGATIVE

## 2021-03-23 MED ORDER — PREDNISONE 10 MG PO TABS: ORAL_TABLET | ORAL | 0 refills | Status: AC

## 2021-03-23 MED ORDER — PREDNISONE 10 MG PO TABS: ORAL_TABLET | ORAL | 0 refills | Status: DC

## 2021-03-23 NOTE — Addendum Note (Signed)
Addended by: Vanessa Barbara on: 03/23/2021 05:15 PM   Modules accepted: Orders

## 2021-03-23 NOTE — Telephone Encounter (Signed)
Called and spoke with patient, advised her of recommendations per [name].  Advised we are not able to test for RSV, she can check with her PCP office.  She has already taken 2 home covid tests and they were negative, advised to have PCR test at local pharmacy ([facility], usually done in drive through).  Advised to come by the office for flu test, let us know when she arrives and someone will come out to swab her while she remains in her car.  She states she will come at 4 pm.  She verbalized understanding.  Nothing further needed.

## 2021-03-23 NOTE — Telephone Encounter (Signed)
Patient came for flu test, result was negative.  Patient has scheduled a PCR covid test.  We do not do RSV tests, patient advised.

## 2021-03-23 NOTE — Telephone Encounter (Signed)
Called and spoke with patient. She was calling to see where she could get the RSV test done. I advised her that we do not test for RSV here and I am not sure of any place that does test RSV in adults.   She verbalized understanding.   Nothing further needed at time of call.

## 2021-03-23 NOTE — Telephone Encounter (Signed)
[name] contacted me just now. Since 12/24  having terrible cough and 1d fever. Covid x 2 negative. Scheduled for dupoixent today   Plan  - pls have her come in and check for RSV, Flu, COVID - if not send her to a lab that can do all 3 (ideally PCR but if not antigen)  - hold dupoixent x 2 days and reasess   - Please take prednisone 40 mg x1 day, then 30 mg x1 day, then 20 mg x1 day, then 10 mg x1 day, and then 5 mg x1 day and stop - monitor symptoims and o2 level - any concerns call back or go to ER

## 2021-03-23 NOTE — Addendum Note (Signed)
Addended by: Vanessa Barbara on: 03/23/2021 12:43 PM   Modules accepted: Orders

## 2021-03-24 ENCOUNTER — Other Ambulatory Visit: Payer: Self-pay | Admitting: Pharmacy Technician

## 2021-03-24 DIAGNOSIS — Z20822 Contact with and (suspected) exposure to covid-19: Secondary | ICD-10-CM | POA: Diagnosis not present

## 2021-03-25 ENCOUNTER — Telehealth: Payer: Self-pay | Admitting: Internal Medicine

## 2021-03-25 ENCOUNTER — Other Ambulatory Visit: Payer: Self-pay | Admitting: Internal Medicine

## 2021-03-25 ENCOUNTER — Ambulatory Visit (INDEPENDENT_AMBULATORY_CARE_PROVIDER_SITE_OTHER): Payer: Medicare PPO

## 2021-03-25 ENCOUNTER — Other Ambulatory Visit: Payer: Self-pay

## 2021-03-25 VITALS — BP 118/61 | HR 81 | Temp 97.9°F | Resp 20 | Ht 62.5 in | Wt 146.0 lb

## 2021-03-25 DIAGNOSIS — J4540 Moderate persistent asthma, uncomplicated: Secondary | ICD-10-CM | POA: Diagnosis not present

## 2021-03-25 MED ORDER — DIPHENHYDRAMINE HCL 50 MG/ML IJ SOLN: 50.0000 mg | Freq: Once | INTRAMUSCULAR | Status: DC | PRN

## 2021-03-25 MED ORDER — AZITHROMYCIN 250 MG PO TABS: ORAL_TABLET | ORAL | 0 refills | Status: DC

## 2021-03-25 MED ORDER — DUPILUMAB 200 MG/1.14ML ~~LOC~~ SOSY
200.0000 mg | PREFILLED_SYRINGE | Freq: Once | SUBCUTANEOUS | Status: AC
  Administered 2021-03-25: 15:00:00 200 mg via SUBCUTANEOUS
  Filled 2021-03-25: qty 2.28

## 2021-03-25 MED ORDER — AZITHROMYCIN 250 MG PO TABS: 250.0000 mg | ORAL_TABLET | ORAL | 0 refills | Status: DC

## 2021-03-25 MED ORDER — EPINEPHRINE 0.3 MG/0.3ML IJ SOAJ: 0.3000 mg | Freq: Once | INTRAMUSCULAR | Status: DC | PRN

## 2021-03-25 MED ORDER — ALBUTEROL SULFATE HFA 108 (90 BASE) MCG/ACT IN AERS: 2.0000 | INHALATION_SPRAY | Freq: Once | RESPIRATORY_TRACT | Status: DC | PRN

## 2021-03-25 MED ORDER — SODIUM CHLORIDE 0.9 % IV SOLN: Freq: Once | INTRAVENOUS | Status: DC | PRN

## 2021-03-25 MED ORDER — FAMOTIDINE IN NACL 20-0.9 MG/50ML-% IV SOLN: 20.0000 mg | Freq: Once | INTRAVENOUS | Status: DC | PRN

## 2021-03-25 MED ORDER — METHYLPREDNISOLONE SODIUM SUCC 125 MG IJ SOLR: 125.0000 mg | Freq: Once | INTRAMUSCULAR | Status: DC | PRN

## 2021-03-25 NOTE — Telephone Encounter (Signed)
Zpak was sent to pharmacy by Magda Paganini and pt was made aware. Labs have been ordered as well as cxr and pt will be coming to office tomorrow morning 12/30. Nothing further needed.

## 2021-03-25 NOTE — Progress Notes (Signed)
Diagnosis: Asthma  Provider:  Marshell Garfinkel, MD  Procedure: Injection  Dupixent (Dupilumab), Dose: 200 mg, Site: subcutaneous, Number of injections: 1  Discharge: Condition: Good, Destination: Home . AVS provided to patient.   Performed by:  Virgil Slinger, Sherlon Handing, LPN

## 2021-03-25 NOTE — Telephone Encounter (Signed)
She contacted me saying she is not better with the prednisone.  She asked if she could do a Z-Pak.  I have affirmed that she could  Plan - Please send in Z-Pak and let her know ASAP - If possible please give visit in the clinic to see her tomorrow with anyone -> if not possible, do cxr, cbc with diff, bmet, mag, phos, LFT 03/26/21 and I Can call with results  -She can also consider doing virtual visit in the general clinic - Go to ER if worse     SIGNATURE    Dr. Brand Males, M.D., F.C.C.P,  Pulmonary and Critical Care Medicine Staff Physician, [place] Director - Interstitial Lung Disease  Program  Pulmonary [place] at [place], [place], [address]  NPI Number:  NPI #[id number]  Pager: [phone], If no answer  -> Check AMION or Try [phone] Telephone (clinical office): [phone] Telephone (research): [phone]  4:03 PM 03/25/2021

## 2021-03-26 ENCOUNTER — Other Ambulatory Visit (INDEPENDENT_AMBULATORY_CARE_PROVIDER_SITE_OTHER): Payer: Medicare PPO

## 2021-03-26 ENCOUNTER — Ambulatory Visit (INDEPENDENT_AMBULATORY_CARE_PROVIDER_SITE_OTHER): Payer: Medicare PPO

## 2021-03-26 DIAGNOSIS — J4540 Moderate persistent asthma, uncomplicated: Secondary | ICD-10-CM

## 2021-03-26 LAB — CBC WITH DIFFERENTIAL/PLATELET
Basophils Absolute: 0 10*3/uL (ref 0.0–0.1)
Basophils Relative: 0.3 % (ref 0.0–3.0)
Eosinophils Absolute: 0 10*3/uL (ref 0.0–0.7)
Eosinophils Relative: 0.2 % (ref 0.0–5.0)
HCT: 41.2 % (ref 36.0–46.0)
Hemoglobin: 13.4 g/dL (ref 12.0–15.0)
Lymphocytes Relative: 46.8 % — ABNORMAL HIGH (ref 12.0–46.0)
Lymphs Abs: 2.3 10*3/uL (ref 0.7–4.0)
MCHC: 32.6 g/dL (ref 30.0–36.0)
MCV: 91.2 fl (ref 78.0–100.0)
Monocytes Absolute: 0.6 10*3/uL (ref 0.1–1.0)
Monocytes Relative: 11.7 % (ref 3.0–12.0)
Neutro Abs: 2 10*3/uL (ref 1.4–7.7)
Neutrophils Relative %: 41 % — ABNORMAL LOW (ref 43.0–77.0)
Platelets: 186 10*3/uL (ref 150.0–400.0)
RBC: 4.52 Mil/uL (ref 3.87–5.11)
RDW: 13.5 % (ref 11.5–15.5)
WBC: 4.8 10*3/uL (ref 4.0–10.5)

## 2021-03-26 LAB — BASIC METABOLIC PANEL
BUN: 11 mg/dL (ref 6–23)
CO2: 28 mEq/L (ref 19–32)
Calcium: 8.8 mg/dL (ref 8.4–10.5)
Chloride: 107 mEq/L (ref 96–112)
Creatinine, Ser: 0.77 mg/dL (ref 0.40–1.20)
GFR: 76.13 mL/min (ref >60.00)
Glucose, Bld: 77 mg/dL (ref 70–99)
Potassium: 3.7 mEq/L (ref 3.5–5.1)
Sodium: 142 mEq/L (ref 135–145)

## 2021-03-26 LAB — PHOSPHORUS: Phosphorus: 3.4 mg/dL (ref 2.3–4.6)

## 2021-03-26 LAB — HEPATIC FUNCTION PANEL
ALT: 48 U/L — ABNORMAL HIGH (ref 0–35)
AST: 26 U/L (ref 0–37)
Albumin: 4.1 g/dL (ref 3.5–5.2)
Alkaline Phosphatase: 57 U/L (ref 39–117)
Bilirubin, Direct: 0.1 mg/dL (ref 0.0–0.3)
Total Bilirubin: 0.3 mg/dL (ref 0.2–1.2)
Total Protein: 6.3 g/dL (ref 6.0–8.3)

## 2021-03-26 LAB — MAGNESIUM: Magnesium: 2.3 mg/dL (ref 1.5–2.5)

## 2021-03-30 ENCOUNTER — Other Ambulatory Visit: Payer: Self-pay | Admitting: Pharmacy Technician

## 2021-04-02 NOTE — Telephone Encounter (Signed)
Called [employer] for update on patient's Dupixent appeal. Per rep, denial has been overturned. Rep is faxing approval letter to clinic.  Knox Saliva, PharmD, MPH, BCPS Clinical Pharmacist (Rheumatology and Pulmonology)

## 2021-04-05 MED ORDER — DUPIXENT 200 MG/1.14ML ~~LOC~~ SOSY: PREFILLED_SYRINGE | SUBCUTANEOUS | 2 refills | Status: DC

## 2021-04-05 NOTE — Telephone Encounter (Signed)
Refill sent for DUPIXENT to [facility]: [phone]   Dose: 200 mg SQ every 14 days  Last OV: 12/14/20 Provider: [name]  Next OV: 6 months  Knox Saliva, PharmD, MPH, BCPS Clinical Pharmacist (Rheumatology and Pulmonology)

## 2021-04-05 NOTE — Telephone Encounter (Signed)
Need new script, Per Heather-F, (zero refills.) [facility] [phone]. Please call-in new script. Patient next appt 04/08/21

## 2021-04-05 NOTE — Addendum Note (Signed)
Addended by: Cassandria Anger on: 04/05/2021 12:42 PM   Modules accepted: Orders

## 2021-04-07 NOTE — Telephone Encounter (Signed)
Called [employer] for re-fax of approval letter for [place] since denial was overturned after appeal. They will refax approval letter to clinic  Approval dates:03/28/20 through 03/27/22 Case # [id number]  Knox Saliva, PharmD, MPH, BCPS Clinical Pharmacist (Rheumatology and Pulmonology)

## 2021-04-08 ENCOUNTER — Ambulatory Visit (INDEPENDENT_AMBULATORY_CARE_PROVIDER_SITE_OTHER): Payer: Medicare PPO | Admitting: *Deleted

## 2021-04-08 ENCOUNTER — Other Ambulatory Visit: Payer: Self-pay

## 2021-04-08 VITALS — BP 114/71 | HR 72 | Temp 97.7°F | Resp 16 | Ht 63.0 in | Wt 148.4 lb

## 2021-04-08 DIAGNOSIS — J4540 Moderate persistent asthma, uncomplicated: Secondary | ICD-10-CM

## 2021-04-08 MED ORDER — FAMOTIDINE IN NACL 20-0.9 MG/50ML-% IV SOLN: 20.0000 mg | Freq: Once | INTRAVENOUS | Status: DC | PRN

## 2021-04-08 MED ORDER — ALBUTEROL SULFATE HFA 108 (90 BASE) MCG/ACT IN AERS: 2.0000 | INHALATION_SPRAY | Freq: Once | RESPIRATORY_TRACT | Status: DC | PRN

## 2021-04-08 MED ORDER — DIPHENHYDRAMINE HCL 50 MG/ML IJ SOLN: 50.0000 mg | Freq: Once | INTRAMUSCULAR | Status: DC | PRN

## 2021-04-08 MED ORDER — EPINEPHRINE 0.3 MG/0.3ML IJ SOAJ: 0.3000 mg | Freq: Once | INTRAMUSCULAR | Status: DC | PRN

## 2021-04-08 MED ORDER — METHYLPREDNISOLONE SODIUM SUCC 125 MG IJ SOLR: 125.0000 mg | Freq: Once | INTRAMUSCULAR | Status: DC | PRN

## 2021-04-08 MED ORDER — DUPILUMAB 200 MG/1.14ML ~~LOC~~ SOSY
200.0000 mg | PREFILLED_SYRINGE | Freq: Once | SUBCUTANEOUS | Status: AC
  Administered 2021-04-08: 200 mg via SUBCUTANEOUS
  Filled 2021-04-08: qty 2.28

## 2021-04-08 MED ORDER — SODIUM CHLORIDE 0.9 % IV SOLN: Freq: Once | INTRAVENOUS | Status: DC | PRN

## 2021-04-08 NOTE — Progress Notes (Signed)
Diagnosis: Asthma  Provider:  Marshell Garfinkel, MD  Procedure: Injection  Dupixent (Dupilumab), Dose: 200 mg, Site: subcutaneous, Number of injections: 1  Discharge: Condition: Good, Destination: Home . AVS provided to patient.   Performed by:  Oren Beckmann, RN

## 2021-04-22 ENCOUNTER — Other Ambulatory Visit: Payer: Self-pay

## 2021-04-22 ENCOUNTER — Ambulatory Visit (INDEPENDENT_AMBULATORY_CARE_PROVIDER_SITE_OTHER): Payer: Medicare PPO

## 2021-04-22 VITALS — BP 136/70 | HR 66 | Temp 98.2°F | Resp 24 | Ht 63.0 in | Wt 148.8 lb

## 2021-04-22 DIAGNOSIS — J4540 Moderate persistent asthma, uncomplicated: Secondary | ICD-10-CM

## 2021-04-22 MED ORDER — EPINEPHRINE 0.3 MG/0.3ML IJ SOAJ: 0.3000 mg | Freq: Once | INTRAMUSCULAR | Status: DC | PRN

## 2021-04-22 MED ORDER — DUPILUMAB 200 MG/1.14ML ~~LOC~~ SOSY
200.0000 mg | PREFILLED_SYRINGE | Freq: Once | SUBCUTANEOUS | Status: AC
  Administered 2021-04-22: 200 mg via SUBCUTANEOUS
  Filled 2021-04-22: qty 2.28

## 2021-04-22 MED ORDER — METHYLPREDNISOLONE SODIUM SUCC 125 MG IJ SOLR: 125.0000 mg | Freq: Once | INTRAMUSCULAR | Status: DC | PRN

## 2021-04-22 MED ORDER — FAMOTIDINE IN NACL 20-0.9 MG/50ML-% IV SOLN: 20.0000 mg | Freq: Once | INTRAVENOUS | Status: DC | PRN

## 2021-04-22 MED ORDER — DIPHENHYDRAMINE HCL 50 MG/ML IJ SOLN: 50.0000 mg | Freq: Once | INTRAMUSCULAR | Status: DC | PRN

## 2021-04-22 MED ORDER — SODIUM CHLORIDE 0.9 % IV SOLN: Freq: Once | INTRAVENOUS | Status: DC | PRN

## 2021-04-22 MED ORDER — ALBUTEROL SULFATE HFA 108 (90 BASE) MCG/ACT IN AERS: 2.0000 | INHALATION_SPRAY | Freq: Once | RESPIRATORY_TRACT | Status: DC | PRN

## 2021-04-22 NOTE — Progress Notes (Signed)
Diagnosis: Asthma  Provider:  Marshell Garfinkel, MD  Procedure: Injection  Dupixent (Dupilumab), Dose: 200 mg, Site: subcutaneous, left arm  Number of injections: 1  Discharge: Condition: Good, Destination: Home . AVS provided to patient.   Performed by:  Koren Shiver, RN

## 2021-04-26 ENCOUNTER — Other Ambulatory Visit: Payer: Self-pay | Admitting: Pharmacy Technician

## 2021-05-05 ENCOUNTER — Other Ambulatory Visit: Payer: Self-pay | Admitting: Cardiovascular Disease

## 2021-05-06 ENCOUNTER — Other Ambulatory Visit: Payer: Self-pay

## 2021-05-06 ENCOUNTER — Ambulatory Visit (INDEPENDENT_AMBULATORY_CARE_PROVIDER_SITE_OTHER): Payer: Medicare PPO

## 2021-05-06 VITALS — BP 134/73 | HR 74 | Temp 97.9°F | Resp 16 | Ht 63.0 in | Wt 149.2 lb

## 2021-05-06 DIAGNOSIS — J4540 Moderate persistent asthma, uncomplicated: Secondary | ICD-10-CM | POA: Diagnosis not present

## 2021-05-06 MED ORDER — METHYLPREDNISOLONE SODIUM SUCC 125 MG IJ SOLR: 125.0000 mg | Freq: Once | INTRAMUSCULAR | Status: DC | PRN

## 2021-05-06 MED ORDER — EPINEPHRINE 0.3 MG/0.3ML IJ SOAJ: 0.3000 mg | Freq: Once | INTRAMUSCULAR | Status: DC | PRN

## 2021-05-06 MED ORDER — SODIUM CHLORIDE 0.9 % IV SOLN: Freq: Once | INTRAVENOUS | Status: DC | PRN

## 2021-05-06 MED ORDER — ALBUTEROL SULFATE HFA 108 (90 BASE) MCG/ACT IN AERS: 2.0000 | INHALATION_SPRAY | Freq: Once | RESPIRATORY_TRACT | Status: DC | PRN

## 2021-05-06 MED ORDER — DIPHENHYDRAMINE HCL 50 MG/ML IJ SOLN: 50.0000 mg | Freq: Once | INTRAMUSCULAR | Status: DC | PRN

## 2021-05-06 MED ORDER — FAMOTIDINE IN NACL 20-0.9 MG/50ML-% IV SOLN: 20.0000 mg | Freq: Once | INTRAVENOUS | Status: DC | PRN

## 2021-05-06 MED ORDER — DUPILUMAB 200 MG/1.14ML ~~LOC~~ SOSY
200.0000 mg | PREFILLED_SYRINGE | Freq: Once | SUBCUTANEOUS | Status: AC
  Administered 2021-05-06: 200 mg via SUBCUTANEOUS
  Filled 2021-05-06: qty 2.28

## 2021-05-06 NOTE — Progress Notes (Signed)
Diagnosis: Asthma  Provider:  Marshell Garfinkel, MD  Procedure: Injection  Dupixent (Dupilumab), Dose: 200 mg, Site: subcutaneous, Number of injections: 1  Discharge: Condition: Good, Destination: Home . AVS provided to patient.   Performed by:  Paul Dykes, RN

## 2021-05-11 DIAGNOSIS — I700 Atherosclerosis of aorta: Secondary | ICD-10-CM | POA: Diagnosis not present

## 2021-05-11 DIAGNOSIS — I2510 Atherosclerotic heart disease of native coronary artery without angina pectoris: Secondary | ICD-10-CM | POA: Diagnosis not present

## 2021-05-11 DIAGNOSIS — E7800 Pure hypercholesterolemia, unspecified: Secondary | ICD-10-CM | POA: Diagnosis not present

## 2021-05-11 DIAGNOSIS — E039 Hypothyroidism, unspecified: Secondary | ICD-10-CM | POA: Diagnosis not present

## 2021-05-11 DIAGNOSIS — R12 Heartburn: Secondary | ICD-10-CM | POA: Diagnosis not present

## 2021-05-18 ENCOUNTER — Other Ambulatory Visit: Payer: Self-pay | Admitting: Cardiovascular Disease

## 2021-05-19 DIAGNOSIS — D224 Melanocytic nevi of scalp and neck: Secondary | ICD-10-CM | POA: Diagnosis not present

## 2021-05-19 DIAGNOSIS — L309 Dermatitis, unspecified: Secondary | ICD-10-CM | POA: Diagnosis not present

## 2021-05-19 DIAGNOSIS — L820 Inflamed seborrheic keratosis: Secondary | ICD-10-CM | POA: Diagnosis not present

## 2021-05-19 DIAGNOSIS — L814 Other melanin hyperpigmentation: Secondary | ICD-10-CM | POA: Diagnosis not present

## 2021-05-19 DIAGNOSIS — D1801 Hemangioma of skin and subcutaneous tissue: Secondary | ICD-10-CM | POA: Diagnosis not present

## 2021-05-19 DIAGNOSIS — L817 Pigmented purpuric dermatosis: Secondary | ICD-10-CM | POA: Diagnosis not present

## 2021-05-19 DIAGNOSIS — L821 Other seborrheic keratosis: Secondary | ICD-10-CM | POA: Diagnosis not present

## 2021-05-19 DIAGNOSIS — D485 Neoplasm of uncertain behavior of skin: Secondary | ICD-10-CM | POA: Diagnosis not present

## 2021-05-19 DIAGNOSIS — D225 Melanocytic nevi of trunk: Secondary | ICD-10-CM | POA: Diagnosis not present

## 2021-05-20 ENCOUNTER — Ambulatory Visit (INDEPENDENT_AMBULATORY_CARE_PROVIDER_SITE_OTHER): Payer: Medicare PPO

## 2021-05-20 ENCOUNTER — Other Ambulatory Visit: Payer: Self-pay

## 2021-05-20 VITALS — BP 131/70 | HR 88 | Temp 98.2°F | Resp 20 | Ht 63.0 in | Wt 146.4 lb

## 2021-05-20 DIAGNOSIS — J4540 Moderate persistent asthma, uncomplicated: Secondary | ICD-10-CM | POA: Diagnosis not present

## 2021-05-20 MED ORDER — ALBUTEROL SULFATE HFA 108 (90 BASE) MCG/ACT IN AERS: 2.0000 | INHALATION_SPRAY | Freq: Once | RESPIRATORY_TRACT | Status: DC | PRN

## 2021-05-20 MED ORDER — FAMOTIDINE IN NACL 20-0.9 MG/50ML-% IV SOLN: 20.0000 mg | Freq: Once | INTRAVENOUS | Status: DC | PRN

## 2021-05-20 MED ORDER — SODIUM CHLORIDE 0.9 % IV SOLN: Freq: Once | INTRAVENOUS | Status: DC | PRN

## 2021-05-20 MED ORDER — DUPILUMAB 200 MG/1.14ML ~~LOC~~ SOSY
200.0000 mg | PREFILLED_SYRINGE | Freq: Once | SUBCUTANEOUS | Status: AC
  Administered 2021-05-20: 200 mg via SUBCUTANEOUS
  Filled 2021-05-20: qty 2.28

## 2021-05-20 MED ORDER — EPINEPHRINE 0.3 MG/0.3ML IJ SOAJ: 0.3000 mg | Freq: Once | INTRAMUSCULAR | Status: DC | PRN

## 2021-05-20 MED ORDER — METHYLPREDNISOLONE SODIUM SUCC 125 MG IJ SOLR: 125.0000 mg | Freq: Once | INTRAMUSCULAR | Status: DC | PRN

## 2021-05-20 MED ORDER — DIPHENHYDRAMINE HCL 50 MG/ML IJ SOLN: 50.0000 mg | Freq: Once | INTRAMUSCULAR | Status: DC | PRN

## 2021-05-20 NOTE — Progress Notes (Signed)
Diagnosis: Asthma  Provider:  Marshell Garfinkel, MD  Procedure: Injection  Dupixent (Dupilumab), Dose: 200 mg, Site: subcutaneous, Number of injections: 1  Discharge: Condition: Good, Destination: Home . AVS provided to patient.   Performed by:  Arnoldo Morale, RN

## 2021-05-25 DIAGNOSIS — K219 Gastro-esophageal reflux disease without esophagitis: Secondary | ICD-10-CM | POA: Diagnosis not present

## 2021-05-25 DIAGNOSIS — R768 Other specified abnormal immunological findings in serum: Secondary | ICD-10-CM | POA: Diagnosis not present

## 2021-05-25 DIAGNOSIS — Z86010 Personal history of colonic polyps: Secondary | ICD-10-CM | POA: Diagnosis not present

## 2021-05-25 DIAGNOSIS — R7401 Elevation of levels of liver transaminase levels: Secondary | ICD-10-CM | POA: Diagnosis not present

## 2021-06-03 ENCOUNTER — Ambulatory Visit: Payer: Medicare PPO

## 2021-06-03 ENCOUNTER — Other Ambulatory Visit: Payer: Self-pay

## 2021-06-03 ENCOUNTER — Ambulatory Visit (INDEPENDENT_AMBULATORY_CARE_PROVIDER_SITE_OTHER): Payer: Medicare PPO

## 2021-06-03 VITALS — BP 112/68 | HR 76 | Temp 97.7°F | Resp 18 | Ht 62.5 in | Wt 145.2 lb

## 2021-06-03 DIAGNOSIS — J4540 Moderate persistent asthma, uncomplicated: Secondary | ICD-10-CM

## 2021-06-03 DIAGNOSIS — M810 Age-related osteoporosis without current pathological fracture: Secondary | ICD-10-CM | POA: Diagnosis not present

## 2021-06-03 MED ORDER — DIPHENHYDRAMINE HCL 50 MG/ML IJ SOLN: 50.0000 mg | Freq: Once | INTRAMUSCULAR | Status: DC | PRN

## 2021-06-03 MED ORDER — SODIUM CHLORIDE 0.9 % IV SOLN: Freq: Once | INTRAVENOUS | Status: DC | PRN

## 2021-06-03 MED ORDER — EPINEPHRINE 0.3 MG/0.3ML IJ SOAJ: 0.3000 mg | Freq: Once | INTRAMUSCULAR | Status: DC | PRN

## 2021-06-03 MED ORDER — FAMOTIDINE IN NACL 20-0.9 MG/50ML-% IV SOLN: 20.0000 mg | Freq: Once | INTRAVENOUS | Status: DC | PRN

## 2021-06-03 MED ORDER — METHYLPREDNISOLONE SODIUM SUCC 125 MG IJ SOLR: 125.0000 mg | Freq: Once | INTRAMUSCULAR | Status: DC | PRN

## 2021-06-03 MED ORDER — ALBUTEROL SULFATE HFA 108 (90 BASE) MCG/ACT IN AERS: 2.0000 | INHALATION_SPRAY | Freq: Once | RESPIRATORY_TRACT | Status: DC | PRN

## 2021-06-03 MED ORDER — DUPILUMAB 200 MG/1.14ML ~~LOC~~ SOSY
200.0000 mg | PREFILLED_SYRINGE | Freq: Once | SUBCUTANEOUS | Status: AC
  Administered 2021-06-03: 11:00:00 200 mg via SUBCUTANEOUS
  Filled 2021-06-03: qty 2.28

## 2021-06-03 NOTE — Progress Notes (Signed)
Diagnosis: Asthma ? ?Provider:  Marshell Garfinkel, MD ? ?Procedure: Injection ? ?Dupixent (Dupilumab), Dose: 200 mg, Site: subcutaneous, Number of injections: 1 ? ?Discharge: Condition: Good, Destination: Home . AVS provided to patient.  ? ?Performed by:  Kamarion Zagami, RN  ? ? ? ?  ?

## 2021-06-07 DIAGNOSIS — R9089 Other abnormal findings on diagnostic imaging of central nervous system: Secondary | ICD-10-CM | POA: Diagnosis not present

## 2021-06-07 DIAGNOSIS — G35 Multiple sclerosis: Secondary | ICD-10-CM | POA: Diagnosis not present

## 2021-06-09 DIAGNOSIS — R768 Other specified abnormal immunological findings in serum: Secondary | ICD-10-CM | POA: Diagnosis not present

## 2021-06-13 ENCOUNTER — Other Ambulatory Visit: Payer: Self-pay | Admitting: Internal Medicine

## 2021-06-13 DIAGNOSIS — J4540 Moderate persistent asthma, uncomplicated: Secondary | ICD-10-CM

## 2021-06-14 ENCOUNTER — Telehealth: Payer: Self-pay | Admitting: Pharmacist

## 2021-06-14 NOTE — Telephone Encounter (Signed)
Refill sent for [place] to [facility]: [phone]  ? ?Dose: 200 mg every 2 weeks ? ?Last OV: 12/14/20 ?Provider: Dr [name] ? ?Next OV: not yet scheduled but due - routed separate telephone encounter to scheduling team ? ?Knox Saliva, PharmD, MPH, BCPS ?Clinical Pharmacist (Rheumatology and Pulmonology)  ?

## 2021-06-14 NOTE — Telephone Encounter (Signed)
Patient needs f/u appt with [name] next month. Per last OV notes from 12/14/20 - ACT/FEnO at f/u in 6  months  ? ? ? ?Knox Saliva, PharmD, MPH, BCPS ?Clinical Pharmacist (Rheumatology and Pulmonology) ?

## 2021-06-15 NOTE — Telephone Encounter (Signed)
Patient is scheduled 4/26 at 11:30am with [name]. ?

## 2021-06-17 ENCOUNTER — Other Ambulatory Visit: Payer: Self-pay

## 2021-06-17 ENCOUNTER — Ambulatory Visit (INDEPENDENT_AMBULATORY_CARE_PROVIDER_SITE_OTHER): Payer: Medicare PPO

## 2021-06-17 VITALS — BP 132/70 | HR 82 | Temp 98.0°F | Ht 63.0 in | Wt 143.4 lb

## 2021-06-17 DIAGNOSIS — J4540 Moderate persistent asthma, uncomplicated: Secondary | ICD-10-CM | POA: Diagnosis not present

## 2021-06-17 DIAGNOSIS — M2550 Pain in unspecified joint: Secondary | ICD-10-CM | POA: Diagnosis not present

## 2021-06-17 DIAGNOSIS — E038 Other specified hypothyroidism: Secondary | ICD-10-CM | POA: Diagnosis not present

## 2021-06-17 DIAGNOSIS — L608 Other nail disorders: Secondary | ICD-10-CM | POA: Diagnosis not present

## 2021-06-17 DIAGNOSIS — S61219A Laceration without foreign body of unspecified finger without damage to nail, initial encounter: Secondary | ICD-10-CM | POA: Diagnosis not present

## 2021-06-17 MED ORDER — DUPILUMAB 200 MG/1.14ML ~~LOC~~ SOSY
200.0000 mg | PREFILLED_SYRINGE | Freq: Once | SUBCUTANEOUS | Status: AC
  Administered 2021-06-17: 200 mg via SUBCUTANEOUS
  Filled 2021-06-17: qty 2.28

## 2021-06-17 NOTE — Progress Notes (Signed)
Diagnosis: Asthma ? ?Provider:  Marshell Garfinkel, MD ? ?Procedure: Injection ? ?Dupixent (Dupilumab), Dose: 200 mg, Site: subcutaneous, Number of injections: 1 ? ?Discharge: Condition: Good, Destination: Home . AVS provided to patient.  ? ?Performed by:  Paul Dykes, RN  ? ? ? ?  ?

## 2021-06-18 ENCOUNTER — Ambulatory Visit: Payer: Medicare PPO

## 2021-06-21 DIAGNOSIS — I2510 Atherosclerotic heart disease of native coronary artery without angina pectoris: Secondary | ICD-10-CM | POA: Diagnosis not present

## 2021-06-21 DIAGNOSIS — E892 Postprocedural hypoparathyroidism: Secondary | ICD-10-CM | POA: Diagnosis not present

## 2021-06-21 DIAGNOSIS — I493 Ventricular premature depolarization: Secondary | ICD-10-CM | POA: Diagnosis not present

## 2021-06-21 DIAGNOSIS — Z6827 Body mass index (BMI) 27.0-27.9, adult: Secondary | ICD-10-CM | POA: Diagnosis not present

## 2021-06-21 DIAGNOSIS — E039 Hypothyroidism, unspecified: Secondary | ICD-10-CM | POA: Diagnosis not present

## 2021-06-21 DIAGNOSIS — E042 Nontoxic multinodular goiter: Secondary | ICD-10-CM | POA: Diagnosis not present

## 2021-06-29 DIAGNOSIS — Z86010 Personal history of colonic polyps: Secondary | ICD-10-CM | POA: Diagnosis not present

## 2021-06-29 DIAGNOSIS — K219 Gastro-esophageal reflux disease without esophagitis: Secondary | ICD-10-CM | POA: Diagnosis not present

## 2021-06-30 DIAGNOSIS — M7061 Trochanteric bursitis, right hip: Secondary | ICD-10-CM | POA: Diagnosis not present

## 2021-06-30 DIAGNOSIS — M25552 Pain in left hip: Secondary | ICD-10-CM | POA: Diagnosis not present

## 2021-06-30 DIAGNOSIS — M7062 Trochanteric bursitis, left hip: Secondary | ICD-10-CM | POA: Diagnosis not present

## 2021-06-30 DIAGNOSIS — M25551 Pain in right hip: Secondary | ICD-10-CM | POA: Diagnosis not present

## 2021-07-01 ENCOUNTER — Ambulatory Visit (INDEPENDENT_AMBULATORY_CARE_PROVIDER_SITE_OTHER): Payer: Medicare PPO

## 2021-07-01 VITALS — BP 126/75 | HR 82 | Temp 98.2°F | Resp 16 | Ht 63.0 in | Wt 143.8 lb

## 2021-07-01 DIAGNOSIS — J4540 Moderate persistent asthma, uncomplicated: Secondary | ICD-10-CM

## 2021-07-01 MED ORDER — DUPILUMAB 200 MG/1.14ML ~~LOC~~ SOSY
200.0000 mg | PREFILLED_SYRINGE | Freq: Once | SUBCUTANEOUS | Status: AC
  Administered 2021-07-01: 200 mg via SUBCUTANEOUS
  Filled 2021-07-01: qty 2.28

## 2021-07-01 NOTE — Progress Notes (Signed)
Diagnosis: Asthma ? ?Provider:  Marshell Garfinkel, MD ? ?Procedure: Injection ? ?Dupixent (Dupilumab), Dose: 200 mg, Site: subcutaneous, Number of injections: 1 ? ?Discharge: Condition: Good, Destination: Home. AVS provided to patient.  ? ?Performed by:  Paul Dykes, RN  ? ? ? ?  ?

## 2021-07-08 DIAGNOSIS — J3089 Other allergic rhinitis: Secondary | ICD-10-CM | POA: Diagnosis not present

## 2021-07-08 DIAGNOSIS — B370 Candidal stomatitis: Secondary | ICD-10-CM | POA: Diagnosis not present

## 2021-07-08 DIAGNOSIS — R110 Nausea: Secondary | ICD-10-CM | POA: Diagnosis not present

## 2021-07-08 DIAGNOSIS — L608 Other nail disorders: Secondary | ICD-10-CM | POA: Diagnosis not present

## 2021-07-15 ENCOUNTER — Ambulatory Visit (INDEPENDENT_AMBULATORY_CARE_PROVIDER_SITE_OTHER): Payer: Medicare PPO

## 2021-07-15 VITALS — BP 150/75 | HR 85 | Temp 97.8°F | Resp 18 | Ht 62.0 in | Wt 138.2 lb

## 2021-07-15 DIAGNOSIS — J4540 Moderate persistent asthma, uncomplicated: Secondary | ICD-10-CM | POA: Diagnosis not present

## 2021-07-15 MED ORDER — DUPILUMAB 200 MG/1.14ML ~~LOC~~ SOSY
200.0000 mg | PREFILLED_SYRINGE | Freq: Once | SUBCUTANEOUS | Status: AC
  Administered 2021-07-15: 200 mg via SUBCUTANEOUS
  Filled 2021-07-15: qty 2.28

## 2021-07-15 NOTE — Progress Notes (Signed)
Diagnosis: Asthma ? ?Provider:  Marshell Garfinkel, MD ? ?Procedure: Injection ? ?Dupixent (Dupilumab), Dose: 200 mg, Site: subcutaneous, Number of injections: 1 ? ?Discharge: Condition: Good, Destination: Home . AVS provided to patient.  ? ?Performed by:  Paul Dykes, RN  ? ? ? ?  ?

## 2021-07-21 ENCOUNTER — Other Ambulatory Visit: Payer: Self-pay | Admitting: *Deleted

## 2021-07-21 ENCOUNTER — Encounter: Payer: Self-pay | Admitting: Internal Medicine

## 2021-07-21 ENCOUNTER — Ambulatory Visit: Payer: Medicare PPO | Admitting: Internal Medicine

## 2021-07-21 VITALS — BP 108/64 | HR 68 | Ht 62.0 in | Wt 140.4 lb

## 2021-07-21 DIAGNOSIS — J4540 Moderate persistent asthma, uncomplicated: Secondary | ICD-10-CM | POA: Diagnosis not present

## 2021-07-21 DIAGNOSIS — J8283 Eosinophilic asthma: Secondary | ICD-10-CM

## 2021-07-21 LAB — NITRIC OXIDE: Nitric Oxide: 18

## 2021-07-21 NOTE — Patient Instructions (Addendum)
ICD-10-CM   ?1. Moderate persistent asthma, unspecified whether complicated  B14.78   ?2. Eosinophilic asthma  G95.62   ? ? ? asthma is been under excellent control with low dose dupixent and symbicort ? ?Plan ?- stop dupixent after next dose ?-continue symbicort as before ?-Use albuterol as needed ? ?Follow-up ?-3 months do exhaled nitric oxide testing and cbc with diff ?= Return to see Dr [name] in 3 months or sooner if needed ? - ACT/FEno at follow-up ?

## 2021-07-21 NOTE — Progress Notes (Signed)
? ? ? ? ?OV 11/30/2016 ? ?Chief Complaint  ?Patient presents with  ? Follow-up  ?  Pt here after acute visit with TP on 8.31.2018. Pt states she is feeling improved but not back to baseline. Pt states she is still having prod cough with pale yellow mucus, still some SOB. Pt denies CP/tightness, f/c/s.   ? ? ? ?76 year old female with moderate persistent asthma. Last seen 11/25/2013 by her practitioner. Given prednisone and Z-Pak. Currently she is improved. Pulmonary function test yesterday shows normalized FEV1 and DLCO. Nitric oxide was high 3 weeks ago. She is here with [name]. Her son has graduated from anesthesia residency at the [place]. She is worried about repeated it takes of steroids. She admitted to noncompliance with Symbicort and Singulair up until May 2018. She is also worried about her monoclonal antibody infusion for multiple sclerosis which she gets every 6 months for the last year or 2. She says after that she gets extremely fatigued and might require a prednisone burst. Occasionally she is not a respiratory infection. However she's been advised by her neurologist to continue with these injections in order to support her multiple sclerosis. There are no other new issues. She had a 7 mm lung nodule that resolved a year ago ? ?76 year old female never smoker followed for moderate persistent asthma and lung nodule. ?MS on Infusion -OCREVUS every 6 months  ?Son is an anesthesiologist  ?  ?01/13/2017 Follow up : Asthma  ?Pt returns for follow up for Asthma . She was seen on 12/27/16 for asthma bronchitic flare with 3 week hx of productive cough with thick yellow mucus and wheezing . Her FENO testing was elevated . She was treated with Zpack and Prednisone taper. Says she did get better with less cough, congestion and wheezing . She is concerned because she gets intermittent wheezing especially in am. Goes away after she uses Symbicort.  ?No fever, discolored mucus, chest pain,  orthopnea,.  ?She remains on Symbicort, Singulair and Zyrtec. Says she is compliant .  ?She says she and family are concerned regarding steroids use. We went over steroid use and potential side effects and goal is to limit use as much as possible . Last ov Nucala was ordered and is under approval process . This was added to help with improved asthma control .  ?She is concerned for adrenal insufficiency , discussed that it can occur with prolonged steroid use. She has been on three 1 week steroid tapers over last 4 months . Suggested she discuss this with PCP to have additional testing or referral to endocrinology if indicated.  ?FENO testing today is slightly improved at 150 .  ? ? ? ?Acute 01/27/17 ?76 year old with history of persistent asthma, multiple sclerosis on  ?complains of increasing dyspnea for the past 1 week associated with yellow mucus, wheezing, chest tightness.  She denies any nebulizer but has not received them yet.  Paperwork is underway for initiation of nucala but has not been started yet.  ? ?She has significant history of GERD with esophageal spasm, atypical chest pain.  She follows at [facility] for this.  She is taking Protonix alternating with Zyrtec. ? ? ? ? ?OV 02/08/2017 ? ?Chief Complaint  ?Patient presents with  ? Follow-up  ?  Pt last seen by PM on 11.2.2018 for an acute visit. Pt states she started to imporve but now is feeling like she is catching a cold again. Pt c/o prod cough with yellow mucus.  Pt deniee SOB, CP/tihgtness and f/c/s.   ? ? ? ?76 year old female with eosinophilic and allergic asthma.  Presents for follow-up.  Most recently seen January 27, 2017 by my colleague for asthma exacerbation.  She is on her last day of prednisone today.  She feels improved.  Her nitric oxide has been the lowest it has been in 2018 which is 115 ppb but still significantly elevated.  She feels she is catching a cold although she feels overall better.  She is on Symbicort schedule and Singulair  scheduled.  She is not on any inhaled anticholinergic.  She is awaiting her interleukin-5 receptor antibody treatment.  She is worried about taking this because of cross interference with the other monoclonal antibody that she takes for her multiple sclerosis.  We had a discussion on this.  I did tell her that the cross-reactive and side effect profile is unknown and the general unpredictability can be higher.  Despite this we thought that the overall risk benefit ratio is in favor of her taking her interleukin-5 receptor antibody for asthma. ? ? ?TEST  ?CT chest 01/2016 7 mm nodule resolved, stable other nodules , improved tree in bud nodularity  ?09/2016 IgE 4, neg RAST  ?09/2016 Eosinophils 300-400  ?Feno >.150 10/2016 >187 12/27/16  ?PFT 11/2016 >nml  FEV 1 , no restriction or obstruction , nml DLCO ? ? ?OV 04/19/2017 ? ?Chief Complaint  ?Patient presents with  ? Follow-up  ?  has had 2 ED visit in December 2018 with asthma/bronchitis,no admission,feels like breathing at this time is better,she can tell improvement  ? ?Follow-up allergic asthma with eosinophilic asthma but normal IgE ? ?She is here with [name] who runs a daycare for after school kids at [facility].  Her son is no longer at the [place] but is now working in [place].  She tells me currently her asthma is stable.  She and her daughter are very categorical that the whole asthma started after she started taking monoclonal antibody infusions for her multiple sclerosis.   there is an infusion administered over 6 hours every 6 months.  She first started this infusion around November 2017.  She tells me that she has infusion reactions that start within a day or so of receiving the infusion.  These infusion reactions are characterized by body ache and fever and aggravation of past medical issues such as the meniscus.  She also has throat irritation and bronchospasm.  Then some 2 weeks later like clockwork she gets acute bronchitis which  is then treated as an asthma exacerbation.  She also has significant fatigue.  She says it takes her months to recover from this and she has a normal good 5 months before the cycle is repeated again.  Most recently the infusion was in November 2018 and then she ended up in the ER for asthma exacerbation in December 2018.  She was supposed to start interleukin-5 receptor antibody for asthma that was poorly controlled and because of persistent eosinophilia and high nitric oxide but given the cycle she and her daughter have decided against Nucala especially because she is convinced it is the [place] for multiple sclerosis that is causing this problem.  She is going to skip her next multiple sclerosis infusion in May 2019.  At this point in time she is compliant with her Symbicort. ? ?Med review shows coreg ? ? ?OV 05/10/2017 ? ?Chief Complaint  ?Patient presents with  ? Acute Visit  ?  Pt has complaints of coughing with thick yellow mucus and occ. dark brown mucus along with wheezing and hoarseness. Pt denies any fever.  ? ?65 ?-year-old female with asthma eosinophilic and moderate persistent on Symbicort.  She presents for an acute visit.  Since seeing me 3 weeks ago she had gradual deterioration in symptoms.  She is having significant nocturnal symptoms.  Her friends are telling her that her voice is hoarse.  She says she is compliant with her Symbicort and Singulair.  Her interleukin-5 receptor antibody is on hold because she is worried about interactions with the monoclonal antibody for multiple sclerosis still in her system and the washout of that is expected only June 2019.  Currently her asthma control questionnaire shows a score of 3.6 showing significant symptoms.  She is waking up a few times at night because of asthma symptoms and when she wakes up she has moderate symptoms.  She is moderately limited in her activities because of asthma.  She is short of breath quite a bit and wheezing all the time although  she hold off on using her albuterol for rescue.  Med review shows that she is on nonspecific beta blocker carvedilol.  There is no fever or chills. ? ? ? ?OV 06/08/2017 ? ?Chief Complaint  ?Patient presents with

## 2021-07-29 ENCOUNTER — Ambulatory Visit (INDEPENDENT_AMBULATORY_CARE_PROVIDER_SITE_OTHER): Payer: Medicare PPO

## 2021-07-29 VITALS — BP 113/64 | HR 64 | Temp 98.0°F | Resp 18 | Ht 63.0 in | Wt 142.0 lb

## 2021-07-29 DIAGNOSIS — J4540 Moderate persistent asthma, uncomplicated: Secondary | ICD-10-CM | POA: Diagnosis not present

## 2021-07-29 MED ORDER — ALBUTEROL SULFATE HFA 108 (90 BASE) MCG/ACT IN AERS: 2.0000 | INHALATION_SPRAY | Freq: Once | RESPIRATORY_TRACT | Status: DC | PRN

## 2021-07-29 MED ORDER — METHYLPREDNISOLONE SODIUM SUCC 125 MG IJ SOLR: 125.0000 mg | Freq: Once | INTRAMUSCULAR | Status: DC | PRN

## 2021-07-29 MED ORDER — FAMOTIDINE IN NACL 20-0.9 MG/50ML-% IV SOLN: 20.0000 mg | Freq: Once | INTRAVENOUS | Status: DC | PRN

## 2021-07-29 MED ORDER — SODIUM CHLORIDE 0.9 % IV SOLN: Freq: Once | INTRAVENOUS | Status: DC | PRN

## 2021-07-29 MED ORDER — DUPILUMAB 200 MG/1.14ML ~~LOC~~ SOSY
200.0000 mg | PREFILLED_SYRINGE | Freq: Once | SUBCUTANEOUS | Status: AC
  Administered 2021-07-29: 200 mg via SUBCUTANEOUS
  Filled 2021-07-29: qty 2.28

## 2021-07-29 MED ORDER — DIPHENHYDRAMINE HCL 50 MG/ML IJ SOLN: 50.0000 mg | Freq: Once | INTRAMUSCULAR | Status: DC | PRN

## 2021-07-29 MED ORDER — EPINEPHRINE 0.3 MG/0.3ML IJ SOAJ: 0.3000 mg | Freq: Once | INTRAMUSCULAR | Status: DC | PRN

## 2021-07-29 NOTE — Progress Notes (Signed)
Diagnosis: Asthma ? ?Provider:  Marshell Garfinkel, MD ? ?Procedure: Injection ? ?Dupixent (Dupilumab), Dose: 200 mg, Site: subcutaneous, Number of injections: 1 ? ?Discharge: Condition: Good, Destination: Home . AVS provided to patient.  ? ?Performed by:  Quintara Bost, Sherlon Handing, LPN  ? ? ? ?  ?

## 2021-08-11 DIAGNOSIS — R233 Spontaneous ecchymoses: Secondary | ICD-10-CM | POA: Diagnosis not present

## 2021-08-11 DIAGNOSIS — Z7982 Long term (current) use of aspirin: Secondary | ICD-10-CM | POA: Diagnosis not present

## 2021-08-11 DIAGNOSIS — H60312 Diffuse otitis externa, left ear: Secondary | ICD-10-CM | POA: Diagnosis not present

## 2021-08-11 DIAGNOSIS — G35 Multiple sclerosis: Secondary | ICD-10-CM | POA: Diagnosis not present

## 2021-08-12 ENCOUNTER — Ambulatory Visit: Payer: Medicare PPO

## 2021-08-12 ENCOUNTER — Encounter: Payer: Self-pay | Admitting: Internal Medicine

## 2021-09-09 ENCOUNTER — Ambulatory Visit: Payer: Medicare PPO | Admitting: Neurology

## 2021-09-09 ENCOUNTER — Encounter: Payer: Self-pay | Admitting: Neurology

## 2021-09-09 VITALS — BP 134/81 | HR 72 | Ht 62.5 in | Wt 139.5 lb

## 2021-09-09 DIAGNOSIS — R9082 White matter disease, unspecified: Secondary | ICD-10-CM

## 2021-09-09 DIAGNOSIS — R208 Other disturbances of skin sensation: Secondary | ICD-10-CM | POA: Insufficient documentation

## 2021-09-09 DIAGNOSIS — G35 Multiple sclerosis: Secondary | ICD-10-CM | POA: Diagnosis not present

## 2021-09-09 DIAGNOSIS — R252 Cramp and spasm: Secondary | ICD-10-CM | POA: Diagnosis not present

## 2021-09-09 MED ORDER — BACLOFEN 10 MG PO TABS: ORAL_TABLET | ORAL | 11 refills | Status: DC

## 2021-09-09 NOTE — Patient Instructions (Addendum)
Stop the carbamazepine Take the muscle relaxant baclofen 1/2 to 1 pill nightly.  May also take one pill as needed during the day. Take diclofenac pills once a day as needed

## 2021-09-09 NOTE — Progress Notes (Signed)
[facility]  PATIENT: [name] DOB: [birth year]  REFERRING DOCTOR OR PCP: Holland Commons FNP SOURCE: Patient, notes from [facility] neurology (Dr. [name]), imaging and lab reports, MRI images personally reviewed.  _________________________________   HISTORICAL  CHIEF COMPLAINT:  Chief Complaint  Patient presents with   New Patient (Initial Visit)    Rm 1, alone. Pt here for TOC for MS, off DMT. Last DMT was Ocrevus. Main concern today is MS hugs and proper use of medication. Gabapentin, diazepam, and carbamazepine.     HISTORY OF PRESENT ILLNESS:  I had the pleasure to see your patient, [name], at the [place] [facility] for neurologic consultation regarding her multiple sclerosis.  She is a 76 year old woman who was diagnosed with MS  Currently, she gets intermittent squeezing sensations in her upper body.    These spells occur more in last year than they did a few years ago.    She is on gabapentin and carbamazepine for the dysesthesias.    She takes these prn.  She has not taken any in the last week and not much on last month.   She had been on gabapentin the longest as she was initially on for dysesthesias in her limbs.   She is also on valium prn.       She is on diclofenac for hr hip pain.     This was left > right in past but now is right > left.      She is walking fairly well.   She does not completely pick up the left foot and may trip.    Sometimes the legs buckle.     She has urinary urgency and some incontinence, usually leakage.  She has trouble readig even with reading glasses but distance vision is off.   Her left eye is worse than her right.    She was on Dupixent for her lungs but is it eing held to see if still needed.      She has some neck pain and reduced ROM in neck.  This is better today than earlier in the week.     She reports fatigue.   She is not sleeping well at night due to leg pain.  She  averages 6 hours.    Some is more dysesthetic and some is more aching.    She sometimes feels she has to move her legs to be comfortable.  She denies depression.    Cognition is usually fine.   She occasioanlly has trouble finding words.      MS history: Ms. Staggs was diagnosed with MS in 2000 after presenting with sensation of not feeling right.   She had an MRI done showing white matter lesions.   She also had some foot weakness at  the time.   She also had an LP that was reportedly negative.   She was placed initially in a drug study (not sure which one but it was a sq injectable one).   She then was placed on Copaxone for several years and then went off of all DMTs.     She began to experience a squeezing sensation intermittently lasting 15 minues at a time a few times a day for several days    She was started on Ocrevus in 2017 due to these sensory symptoms.      She reports stopping in late 2018 or 2019 due to two pneumonias.    She  has been off any DMT since.    Imaging: MRI of the brain 12/12/2018 shows stable confluent T2/FLAIR hyperintense foci predominantly in the subcortical and deep white matter.  There were no foci in the infratentorial white matter.  A couple small foci are noted in the thalamus.  None of the foci enhance or appear to be acute.  The scan was unchanged compared to the 08/18/2017 MRI.  MRI of the cervical spine 04/06/2016 shows a normal spinal cord.  There are degenerative changes from C3-C4 through C6-C7 Causing mild spinal stenosis.  At C3-C4, there is left greater than right foraminal narrowing with potential for left C4 nerve root compression.  At C4-C5, there is right greater than left foraminal narrowing with potential for compression of the right C5 nerve roots.  At C5-C6, there is right greater than left foraminal narrowing with potential for right C6 nerve root compression.  At C6-C7, there is bilateral foraminal narrowing though there did not appear to be nerve root  compression.  REVIEW OF SYSTEMS: Constitutional: No fevers, chills, sweats, or change in appetite Eyes: No visual changes, double vision, eye pain Ear, nose and throat: No hearing loss, ear pain, nasal congestion, sore throat Cardiovascular: No chest pain, palpitations Respiratory:  No shortness of breath at rest or with exertion.   No wheezes GastrointestinaI: No nausea, vomiting, diarrhea, abdominal pain, fecal incontinence Genitourinary:  No dysuria, urinary retention or frequency.  No nocturia. Musculoskeletal:  No neck pain, back pain Integumentary: No rash, pruritus, skin lesions Neurological: as above Psychiatric: No depression at this time.  No anxiety Endocrine: No palpitations, diaphoresis, change in appetite, change in weigh or increased thirst Hematologic/Lymphatic:  No anemia, purpura, petechiae. Allergic/Immunologic: No itchy/runny eyes, nasal congestion, recent allergic reactions, rashes  ALLERGIES: Allergies  Allergen Reactions   Crestor [Rosuvastatin] Other (See Comments)    Joint pain    Iodinated Contrast Media Other (See Comments)    Sneezing and itchy throat; dye was Isovue 300   Vicodin [Hydrocodone-Acetaminophen]     Made pass-out one time and is okay taking now   Isovue [Iopamidol]     Pt had sneezing and itching of her throat and soft palate.  Dr Alvester Chou checked pt.  She will need premeds in the future.  J Bohm    HOME MEDICATIONS:  Current Outpatient Medications:    albuterol (PROAIR HFA) 108 (90 Base) MCG/ACT inhaler, INHALE 1-2 PUFFS INTO THE LUNGS EVERY SIX HOURS AS NEEDED FOR WHEEZING OR SHORTNESS OF BREATH., Disp: 8.5 g, Rfl: 2   baclofen (LIORESAL) 10 MG tablet, Take 1/2 to 1 pill every night and 1 pill po qd prn spasticity, Disp: 60 each, Rfl: 11   budesonide-formoterol (SYMBICORT) 80-4.5 MCG/ACT inhaler, Inhale 2 puffs into the lungs in the morning and at bedtime., Disp: 10.2 g, Rfl: 12   Cholecalciferol (VITAMIN D PO), Take 1 tablet by mouth  daily., Disp: , Rfl:    denosumab (PROLIA) 60 MG/ML SOSY injection, 60 mg, Disp: , Rfl:    diazepam (VALIUM) 2 MG tablet, Take 2 mg by mouth every 6 (six) hours as needed for anxiety., Disp: , Rfl:    diltiazem (CARDIZEM CD) 240 MG 24 hr capsule, TAKE (1) CAPSULE DAILY., Disp: 90 capsule, Rfl: 1   EPINEPHRINE 0.3 mg/0.3 mL IJ SOAJ injection, USE AS DIRECTED, Disp: 2 each, Rfl: 3   Evolocumab (REPATHA) 140 MG/ML SOSY, Inject into the skin., Disp: , Rfl:    ezetimibe (ZETIA) 10 MG tablet, 1 tablet, Disp: ,  Rfl:    gabapentin (NEURONTIN) 300 MG capsule, Take 300 mg by mouth daily as needed. , Disp: , Rfl:    ipratropium-albuterol (DUONEB) 0.5-2.5 (3) MG/3ML SOLN, Take 3 mLs by nebulization 2 (two) times daily. (Patient taking differently: Take 3 mLs by nebulization 2 (two) times daily. Pt takes as needed), Disp: 360 mL, Rfl: 5   isosorbide mononitrate (IMDUR) 60 MG 24 hr tablet, TAKE 1&1/2 TABLETS ONCE DAILY., Disp: 135 tablet, Rfl: 3   losartan (COZAAR) 50 MG tablet, TAKE 1&1/2 TABLETS ONCE DAILY., Disp: 135 tablet, Rfl: 1   meclizine (ANTIVERT) 25 MG tablet, Take 1 tablet (25 mg total) by mouth 3 (three) times daily as needed for dizziness., Disp: 30 tablet, Rfl: 0   nitroGLYCERIN (NITROSTAT) 0.4 MG SL tablet, PLACE 1 TAB UNDER THE TONGUE AS NEEDED FOR CHEST PAIN MAY REPEAT EVERY 5 MINUTES., Disp: 25 tablet, Rfl: 3   pantoprazole (PROTONIX) 20 MG tablet, TAKE 1 TABLET BY MOUTH TWICE DAILY., Disp: 60 tablet, Rfl: 3   PARoxetine (PAXIL) 10 MG tablet, Take 5 mg by mouth daily. , Disp: , Rfl:    Respiratory Therapy Supplies (FLUTTER) DEVI, Use as directed, Disp: 1 each, Rfl: 0  PAST MEDICAL HISTORY: Past Medical History:  Diagnosis Date   Anemia    past history-many yrs ago   Anginal pain ([place])    being evaluated by Dr. Tyrone Sage, arm pain,"bad indigestion" -no heart related findings as of yet   Arthritis    hip. back pain   Complication of anesthesia    s/p Hysterectomy "vagal response "heart  stopped" -did not require shocking.   Coronary artery disease    Dry eyes    Esophageal spasm    GERD (gastroesophageal reflux disease)    MS (multiple sclerosis) ([place])    stable-sees [name] every 6 months    PAST SURGICAL HISTORY: Past Surgical History:  Procedure Laterality Date   ABDOMINAL HYSTERECTOMY     BLEPHAROPLASTY Bilateral    CARDIAC CATHETERIZATION  10/04/06   MINOR CAD,SINGLE VESSEL INVOLVING THE CIRCUMFLEX. 20 TO 30% PROXIMALLY AND 10 TO 20% IN THE MIDDLE SEGMENT.MILD MUSCLE BRIDGING, MID LAD.NORMAL RCA.NORMAL LV FUNCTION.NORMAL MITRAL AND AORTIC VALVE.NORMAL APPEARING AORTA,THORACIC AND ABDOMINAL.NORMAL RENAL ARTERIES.   CARDIOLOGY NUCLEAR MED STUDY  06/22/12   NL LV FUNCTION,EF 68%,NL WALL MOTION.   CAROTID DUPLEX  07/02/11   YKD:XIPJ SOFT PLAQUE NOTED DISTAL CCA AND ORGIN AND PROXIMAL ICA,LEFT>RIGHT.NO ICA STENOSIS. VERTEBRAL ARTERY FLOW IS ANTEGRADE.   CESAREAN SECTION     x2    COLONOSCOPY WITH PROPOFOL N/A 12/11/2014   Procedure: COLONOSCOPY WITH PROPOFOL;  Surgeon: Ronald Lobo, MD;  Location: [facility];  Service: Endoscopy;  Laterality: N/A;   KNEE ARTHROSCOPY Left    scope   PARATHYROIDECTOMY     partial-many years ago   thumb surgery Bilateral    built up and bone removal   TONSILLECTOMY     TRANSTHORACIC ECHOCARDIOGRAM  07/02/11   LV CAVITY SIZE IS NORMAL. SYSTOLIC FUNCTION WAS NORMAL.EF=55% TO 60%.INCREASED RELATIVE CONTRIBUTION OF ATRIAL CONTRACTION TO VENTRICULAR FILLING;MAYBE DUE TO HYPOVOLEMIA. AV=MILD REGURG.    FAMILY HISTORY: Family History  Problem Relation Age of Onset   Heart disease Mother    Asthma Father    Bladder Cancer Father    Heart failure Father    Hyperlipidemia Brother     SOCIAL HISTORY:  Social History   Socioeconomic History   Marital status: Divorced    Spouse name: Not on file   Number of  children: 3   Years of education: Not on file   Highest education level: Master's degree (e.g., MA, MS, MEng, MEd, MSW, MBA)   Occupational History   Occupation: retired  Tobacco Use   Smoking status: Never   Smokeless tobacco: Never  Vaping Use   Vaping Use: Never used  Substance and Sexual Activity   Alcohol use: Yes    Alcohol/week: 0.0 standard drinks of alcohol    Comment: wine occ.   Drug use: No   Sexual activity: Not on file  Other Topics Concern   Not on file  Social History Narrative   Lives alone and new puppy   R handed   Caffeine: 16oz a day   Social Determinants of Health   Financial Resource Strain: Not on file  Food Insecurity: Not on file  Transportation Needs: Not on file  Physical Activity: Not on file  Stress: Not on file  Social Connections: Not on file  Intimate Partner Violence: Not on file     PHYSICAL EXAM  Vitals:   09/09/21 1311  BP: 134/81  Pulse: 72  Weight: 139 lb 8 oz (63.3 kg)  Height: 5' 2.5" (1.588 m)    Body mass index is 25.11 kg/m.   General: The patient is well-developed and well-nourished and in no acute distress  HEENT:  Head is [place]/AT.  Sclera are anicteric.  Funduscopic exam shows normal optic discs and retinal vessels.  Neck: No carotid bruits are noted.  The neck is nontender.  Cardiovascular: The heart has a regular rate and rhythm with a normal S1 and S2. There were no murmurs, gallops or rubs.    Skin: Extremities are without rash or  edema.  Musculoskeletal:  Back is nontender  Neurologic Exam  Mental status: The patient is alert and oriented x 3 at the time of the examination. The patient has apparent normal recent and remote memory, with an apparently normal attention span and concentration ability.   Speech is normal.  Cranial nerves: Extraocular movements are full. Pupils are equal, round, and reactive to light and accomodation.  COlors slightly desaturated OS.    Facial symmetry is present. There is good facial sensation to soft touch bilaterally.Facial strength is normal.  Trapezius and sternocleidomastoid strength is normal.  No dysarthria is noted.  The tongue is midline, and the patient has symmetric elevation of the soft palate. No obvious hearing deficits are noted.  Motor:  Muscle bulk is normal.   Tone is normal. Strength is  5 / 5 in all 4 extremities.   Sensory: Sensory testing is intact to pinprick, soft touch and vibration sensation in all 4 extremities.  Coordination: Cerebellar testing reveals good finger-nose-finger and heel-to-shin bilaterally.  Gait and station: Station is normal.   Gait is arthritic.  The tandem gait is mildly wide.  Romberg is negative.   Reflexes: Deep tendon reflexes are symmetric and normal in the arms and increased at the knees.  Ankle reflexes are normal..   Plantar responses are flexor.    DIAGNOSTIC DATA (LABS, IMAGING, TESTING) - I reviewed patient records, labs, notes, testing and imaging myself where available.  Lab Results  Component Value Date   WBC 4.8 03/26/2021   HGB 13.4 03/26/2021   HCT 41.2 03/26/2021   MCV 91.2 03/26/2021   PLT 186.0 03/26/2021      Component Value Date/Time   NA 142 03/26/2021 0905   K 3.7 03/26/2021 0905   CL 107 03/26/2021 0905   CO2 28  03/26/2021 0905   GLUCOSE 77 03/26/2021 0905   BUN 11 03/26/2021 0905   CREATININE 0.77 03/26/2021 0905   CALCIUM 8.8 03/26/2021 0905   PROT 6.3 03/26/2021 0905   ALBUMIN 4.1 03/26/2021 0905   AST 26 03/26/2021 0905   ALT 48 (H) 03/26/2021 0905   ALKPHOS 57 03/26/2021 0905   BILITOT 0.3 03/26/2021 0905   GFRNONAA >60 08/03/2020 1603   GFRAA >60 03/25/2018 1701   Lab Results  Component Value Date   TSH 0.735 07/02/2011       ASSESSMENT AND PLAN  Multiple sclerosis (HCC)  Dysesthesia  Muscle cramps  White matter abnormality on MRI of brain  In summary, Ms. Rosier is a 76 year old woman with a diagnosis of multiple sclerosis not currently on a disease modifying therapy who has dysesthesias with mixed characteristics.  Review of the MRI does not show any lesions in the  brainstem or the spinal cord.  Most of her brain T2/FLAIR hypertense foci are in the subcortical and deep white matter which is a nonspecific pattern.  She will remain off of a disease modifying therapy.  Currently, she is taking carbamazepine, diazepam or gabapentin as needed for the dysesthesias.  It is unlikely that the antiepileptic drugs are having much benefit when taken in this way.  Therefore, she will discontinue the carbamazepine.  I would also change her diazepam to baclofen that she can take every night to see if that helps some of the deeper uncomfortable sensations.  (and also can take half a pill to 1 pill during the day if needed).  This may also help her sleep.  If this does not help, I will have her take the gabapentin at night since her pain is usually worse after she lays down.  She will continue to take diclofenac as needed.  She will return to see me in 6 months or sooner if there are new or worsening neurologic symptoms.  Thank you for asking me to see Ms. Klonowski.  Please let me know if I can be of further assistance with her or other patients in the future.   Haidyn Kilburg A. Felecia Shelling, MD, [facility] 12/19/2681, 4:19 PM Certified in Neurology, Clinical Neurophysiology, Sleep Medicine and Neuroimaging  [facility] [address], [place],  [address] [phone]

## 2021-09-23 DIAGNOSIS — E7801 Familial hypercholesterolemia: Secondary | ICD-10-CM | POA: Diagnosis not present

## 2021-09-23 DIAGNOSIS — I10 Essential (primary) hypertension: Secondary | ICD-10-CM | POA: Diagnosis not present

## 2021-09-27 DIAGNOSIS — I2510 Atherosclerotic heart disease of native coronary artery without angina pectoris: Secondary | ICD-10-CM | POA: Diagnosis not present

## 2021-09-27 DIAGNOSIS — J3089 Other allergic rhinitis: Secondary | ICD-10-CM | POA: Diagnosis not present

## 2021-09-27 DIAGNOSIS — M810 Age-related osteoporosis without current pathological fracture: Secondary | ICD-10-CM | POA: Diagnosis not present

## 2021-09-27 DIAGNOSIS — M7061 Trochanteric bursitis, right hip: Secondary | ICD-10-CM | POA: Diagnosis not present

## 2021-09-27 DIAGNOSIS — I700 Atherosclerosis of aorta: Secondary | ICD-10-CM | POA: Diagnosis not present

## 2021-09-27 DIAGNOSIS — G35 Multiple sclerosis: Secondary | ICD-10-CM | POA: Diagnosis not present

## 2021-09-27 DIAGNOSIS — M7062 Trochanteric bursitis, left hip: Secondary | ICD-10-CM | POA: Diagnosis not present

## 2021-09-27 DIAGNOSIS — Z0000 Encounter for general adult medical examination without abnormal findings: Secondary | ICD-10-CM | POA: Diagnosis not present

## 2021-09-27 DIAGNOSIS — E038 Other specified hypothyroidism: Secondary | ICD-10-CM | POA: Diagnosis not present

## 2021-10-05 DIAGNOSIS — H6123 Impacted cerumen, bilateral: Secondary | ICD-10-CM | POA: Diagnosis not present

## 2021-10-08 NOTE — Progress Notes (Unsigned)
Subjective:    CC: R hip pain  I, [name], LAT, ATC, am serving as scribe for Dr. Lynne Leader.  HPI: Pt is a 76 y/o female presenting w/ c/o R hip pain x approximately 3 years .  She locates her pain to .  Her pain started in her L hip and has now transitioned to her R hip.  She has seen Dr. Alvan Dame in the past and has had multiple injections in her B hips/GTs w/ her last one being in Dec 2022.  She is going on vacation to the beach next week.  Radiating pain: yes into her R thigh and lower leg to her R lateral lower leg R hip mechanical symptoms: no Aggravating factors: transitioning from sitting to standing; walking; climbing stairs: laying on her R side Treatments tried: Voltaren gel; B hip injections; prior PT; oral diclofenac  Pertinent review of Systems: No fevers or chills  Relevant historical information: CAD.  Multiple sclerosis.   Objective:    Vitals:   10/11/21 1056  BP: 120/72  Pulse: 73  SpO2: 94%   General: Well Developed, well nourished, and in no acute distress.   MSK: Right hip: Normal-appearing Tender palpation greater trochanter. Normal hip range of motion. Hip abduction strength 3+/5.  External rotation strength 4/5. Leg lengths are equal  Lab and Radiology Results  X-ray images right hip obtained today personally and independently interpreted. Enthesiopathy changes at greater trochanter.  No acute fractures.  No severe degenerative changes. Await formal radiology review  Hip greater trochanteric injection: Right Consent obtained and timeout performed. Area of maximum tenderness palpated and identified. Skin cleaned with alcohol, cold spray applied. A spinal needle was used to access the greater trochanteric bursa. '40mg'$  of Kenalog and 2 mL of Marcaine were used to inject the trochanteric bursa. Patient tolerated the procedure well.   Impression and Recommendations:    Assessment and Plan: 76 y.o. female with right lateral hip pain  thought to be primarily due to hip abductor tendinopathy and greater trochanteric bursitis.  This is a chronic ongoing issue primarily I think due to her weakness of her hip abductors.  She would do well to restrengthen them with physical therapy.  Plan to refer back to her physical therapist at [facility] physical therapy.  She is going to the beach for vacation next week and a cortisone shot will at the very least settle her hip down and allow her to enjoy vacation.  Plan for cortisone shot today and physical therapy and check back in 6 weeks..  At the end of the she mentioned that her neck hurts as well.  Physical therapy should help with that.  PDMP not reviewed this encounter. Orders Placed This Encounter  Procedures   DG HIP UNILAT W OR W/O PELVIS 2-3 VIEWS RIGHT    Standing Status:   Future    Number of Occurrences:   1    Standing Expiration Date:   11/11/2021    Order Specific Question:   Reason for Exam (SYMPTOM  OR DIAGNOSIS REQUIRED)    Answer:   R hip pain    Order Specific Question:   Preferred imaging location?    Answer:   [facility]   Ambulatory referral to Physical Therapy    Referral Priority:   Routine    Referral Type:   Physical Medicine    Referral Reason:   Specialty Services Required    Requested Specialty:   Physical Therapy  Number of Visits Requested:   1   No orders of the defined types were placed in this encounter.   Discussed warning signs or symptoms. Please see discharge instructions. Patient expresses understanding.   The above documentation has been reviewed and is accurate and complete Lynne Leader, M.D.

## 2021-10-11 ENCOUNTER — Encounter: Payer: Self-pay | Admitting: Family Medicine

## 2021-10-11 ENCOUNTER — Ambulatory Visit (INDEPENDENT_AMBULATORY_CARE_PROVIDER_SITE_OTHER): Payer: Medicare PPO

## 2021-10-11 ENCOUNTER — Ambulatory Visit (INDEPENDENT_AMBULATORY_CARE_PROVIDER_SITE_OTHER): Payer: Medicare PPO | Admitting: Family Medicine

## 2021-10-11 VITALS — BP 120/72 | HR 73 | Ht 62.5 in | Wt 142.0 lb

## 2021-10-11 DIAGNOSIS — G8929 Other chronic pain: Secondary | ICD-10-CM

## 2021-10-11 DIAGNOSIS — M542 Cervicalgia: Secondary | ICD-10-CM

## 2021-10-11 DIAGNOSIS — M25551 Pain in right hip: Secondary | ICD-10-CM

## 2021-10-11 NOTE — Patient Instructions (Addendum)
Nice to meet you today.  You had a R hip (GT) injection.  Call or go to the ER if you develop a large red swollen joint with extreme pain or oozing puss.   [facility].  Their office will call you but please let us know if you don't hear from them in one week regarding scheduling.  Please get an Xray today before you leave.   Follow-up: 6 weeks

## 2021-10-12 NOTE — Progress Notes (Signed)
Right hip x-ray looks normal to radiology

## 2021-10-14 DIAGNOSIS — L308 Other specified dermatitis: Secondary | ICD-10-CM | POA: Diagnosis not present

## 2021-10-25 ENCOUNTER — Other Ambulatory Visit: Payer: Self-pay | Admitting: Internal Medicine

## 2021-10-27 DIAGNOSIS — Z1231 Encounter for screening mammogram for malignant neoplasm of breast: Secondary | ICD-10-CM | POA: Diagnosis not present

## 2021-10-27 DIAGNOSIS — Z01419 Encounter for gynecological examination (general) (routine) without abnormal findings: Secondary | ICD-10-CM | POA: Diagnosis not present

## 2021-10-27 DIAGNOSIS — Z6825 Body mass index (BMI) 25.0-25.9, adult: Secondary | ICD-10-CM | POA: Diagnosis not present

## 2021-10-29 DIAGNOSIS — M7061 Trochanteric bursitis, right hip: Secondary | ICD-10-CM | POA: Diagnosis not present

## 2021-11-08 ENCOUNTER — Encounter: Payer: Self-pay | Admitting: Internal Medicine

## 2021-11-08 ENCOUNTER — Ambulatory Visit: Payer: Medicare PPO | Admitting: Internal Medicine

## 2021-11-08 VITALS — BP 122/68 | HR 65 | Temp 97.7°F | Ht 62.5 in | Wt 141.2 lb

## 2021-11-08 DIAGNOSIS — J8283 Eosinophilic asthma: Secondary | ICD-10-CM

## 2021-11-08 DIAGNOSIS — J4540 Moderate persistent asthma, uncomplicated: Secondary | ICD-10-CM | POA: Diagnosis not present

## 2021-11-08 DIAGNOSIS — Z7185 Encounter for immunization safety counseling: Secondary | ICD-10-CM

## 2021-11-08 LAB — NITRIC OXIDE: Nitric Oxide: 31

## 2021-11-08 NOTE — Progress Notes (Signed)
OV 11/30/2017  Subjective:  Patient ID: [name], female , DOB: [birth year] , age 76 y.o. , MRN: [id number] , ADDRESS: [place] [facility] [address]   11/30/2017 -   Chief Complaint  Patient presents with   Follow-up    Pt states she has been doing good since last visit except states she has had a headache x2 days and has had a cough. Pt also has c/o chest tightness.     HPI [name] 76 y.o. -follow-up severe persistent asthma with poorly controlled symptoms. She is on Dulera, Singulair and biologic Nucala. She reports that currently it is one of her better days and weeks. Despite that asthma control questionnaire shows significant symptom with the 5 point score of 1.8. She says she is not waking up in the middle of the night because of asthma when she wakes up she is very mild symptoms when she is very slightly limited in her activities and she is moderately short of breath and wheezing a lot of the time but not using much albuterol for rescue. This is despite compliance. Last visit we switched her to [place] but this is stuck with insurance pre-authorization required and that is some delays with this. In addition she also tells me that at every visit she's been giving sputum samples at our lab basementand she is unclear why no one called her with results. I have told her that I was unaware that this was going on. Review of the charts indicate sputum samples given October 2018 in April 2019. Along and consistent with a respiratory in asthma symptoms at that early morning she has cough with yellow sputum which is consistent with high airway eosinophil load as evidenced by significantly high exhaled nitric oxide is despite biologic therapy.  [name]  ROS - per HPI       OV 03/01/2018  Subjective:  Patient ID: [name], female , DOB: 1946-02-15 , age 80 y.o. , MRN: [id number] , ADDRESS: [facility] [address]   03/01/2018 -   Chief Complaint   Patient presents with   Follow-up    f/u asthma, no wheezing since dupixent     HPI DASHEA MCMULLAN 76 y.o. -  Follow-up severe eosinophilic asthma on Dupixent.  She is taking the shots in her arm every 2 weeks.  This is been going on for 8 weeks or so.  She reports excellent improvement in her asthma.  Asthma control questionnaire 0 out of 5.  Significant improvement in her exam nitric oxide today going from the mid 100s to 43.  She continues to be on Dulera and Singulair.  She is asking if she can de-escalate any of this therapy.  She is not waking up in the middle of the night with asthma.  She not having any symptoms when she wakes up.  Not limited in her activities.  When she wakes up she not short of breath.  No wheezing.  She not using albuterol for rescue.  She thinks she might be getting a cold but she is not sure.  Nitric oxide and symptom profile shows huge improvements.  However she is wondering if she is having side effects from the dupilumab.  She is reporting some blurred vision and needing to use reading glasses more so after starting the dupilumab.  This is temporally correlated.  Review of the literature shows conjunctivitis and pruritus reported at low percentages but not blurred vision.  OV  05/31/2018  Subjective:  Patient ID: [name], female , DOB: 28-Jan-1946 , age 58 y.o. , MRN: [id number] , ADDRESS: [facility] [address]   05/31/2018 -   Chief Complaint  Patient presents with   Follow-up    Pt states she has been doing okay since last visit and denies any current complaints of cough, SOB, or CP     HPI [name] 76 y.o. -presents for follow-up.  She has severe asthma with eosinophilia.  Finally she is on Dupixent/in December 2019 she had asthma exacerbation and ended up in the ER and needed prednisone but after that she is been doing well.  Asthma control question is 0.  She not waking up in the middle of the night with symptoms when she wakes  up she is asymptomatic.  No limitations in activities no albuterol rescue use.  No wheezing no shortness of breath no cough.  Asthma control question is 0.  Exam nitric oxide is further improved to 22.  However she wants to stop the Dupixent because she feels she is getting a brain fog although she states that it could be related to [place].  She also wants to start her MS medications and is worried about the effects of polypharmacy.  She also discussed because of the upcoming coronavirus 19 pandemic.           OV 10/14/2019  Subjective:  Patient ID: [name], female , DOB: 10-12-1945 , age 5 y.o. , MRN: [id number] , ADDRESS: [place] [facility] [address]   10/14/2019 -   Chief Complaint  Patient presents with   Follow-up    Eosinophilic moderate to severe persistent asthma HPI [name] 76 y.o. -presents for follow-up.  I last saw her March 2020.  Then with the pandemic I did not see her.  She has been coming to the office and getting her Dupixent twice weekly.  She is on the higher dose.  She is not using any inhaled steroid.  She wants to know if she can reduce the dose of the Dupixent.  She is also on Zyrtec and Singulair.  She wants to know if she can stop this.  Asthma is completely under control and she is asymptomatic.        ROS  OV 12/14/2020  Subjective:  Patient ID: [name], female , DOB: November 05, 1945 , age 63 y.o. , MRN: [id number] , ADDRESS: [place] [id number] PCP [name], MD Patient Care Team: [name], MD as PCP - General (Internal Medicine) Troy Sine, MD as PCP - Cardiology (Cardiology)  This Provider for this visit: Treatment Team:  Attending Provider: Brand Males, MD    12/14/2020 -   Chief Complaint  Patient presents with   Follow-up    Pt states she has been doing okay since last visit. States she has had an occ dry cough which lasted about 10 days but then went away.    Eosinophilic  moderate to severe persistent asthma  HPI [name] 76 y.o. -last sseen by me July 2021 and then NP in OCt 2021. Asthma under excellent control based on ACT and feno. If she cuts grass or trimes bushes she gets cough. Has randon cough episodes occasionally. Otherwise well . Currentlyt on dupixent alone as single drug for asthma.  Discussed high dose flu shot and she will have it. Discussed covid mRNA bivalent booster -r ecommended she have it. Never  had covid before     Asthma Control Test ACT Total Score  12/14/2020 25  01/14/2020 24     Lab Results  Component Value Date   NITRICOXIDE 25 12/14/2020     OV 07/21/2021  Subjective:  Patient ID: [name], female , DOB: Aug 11, 1945 , age 69 y.o. , MRN: [id number] , ADDRESS: [place] [id number] PCP [name], MD Patient Care Team: [name], MD as PCP - General (Internal Medicine) Troy Sine, MD as PCP - Cardiology (Cardiology)  This Provider for this visit: Treatment Team:  Attending Provider: Brand Males, MD    07/21/2021 -   Chief Complaint  Patient presents with   Follow-up    Pt states she has been doing good since last visit and denies any complaints. States that she has not had any recent flare ups.     HPI [name] 76 y.o. -follows up eosinophilic asthma.  She is on Dupixent low-dose.  She is also now on Symbicort.  She says she is doing really well.  Asthma control test questionnaire is normal.  Exam nitric oxide test is normal.  She wants to stop taking Dupixent.  This worked really well for her but she feels that she is under remission.  She feels her daughter [name] might be upset if she stopped the [place].  However we discussed this.  She has been on low-dose for a year.  We think we can take an expectant approach of stopping Dupixent and see how she does.  She is open to this.  And therefore we took decision to stop Dupixent but continue  Symbicort.   ACT  Asthma Control Test ACT Total Score  07/21/2021 11:34 AM 25  12/14/2020  1:50 PM 25  01/14/2020 10:02 AM 24    Lab Results  Component Value Date   NITRICOXIDE 18 07/21/2021       OV 11/08/2021  Subjective:  Patient ID: [name], female , DOB: Jul 15, 1945 , age 47 y.o. , MRN: [id number] , ADDRESS: [place] [id number] PCP [name], MD Patient Care Team: [name], MD as PCP - General (Internal Medicine) Troy Sine, MD as PCP - Cardiology (Cardiology)  This Provider for this visit: Treatment Team:  Attending Provider: Brand Males, MD    11/08/2021 -   Chief Complaint  Patient presents with   Follow-up    Pt states she has been doing okay since last visit and denies any complaints.     HPI [name] 76 y.o. -returns for follow-up.  She has eosinophilic asthma.  In April 2023 after years of being on [place] we stopped it.  She continues to do well.  Her exhaled nitric oxide is within normal range/slightly gray zone 31.  Otherwise feeling good.  She is up-to-date with her vaccines.  She wanted to know about RSV vaccine.  She is going to think about this.  She wanted know about COVID mRNA vaccine booster.  We discussed about the current strains in the community.  We took a shared decision making to evaluate the strains in the community for COVID and then decide on the COVID mRNA booster.  She is going to wait on the literature on RSV vaccine.  She will have the high-dose flu shot.  At this point in time we took a shared decision making to monitor her off [place] because asthma control is very good.    Ref. Range 10/07/2016 12:29  05/10/2017  06/08/2017 Start nucala 08/08/2017  10/16/2017   11/30/2017  03/01/2018 On dupixemen 05/31/2018  01/14/20  Stopped singulair in July, reduced dupixent doe july 12/14/2020 Using ACT from no won 11/08/2021 OFF DUPIXENT since April 2023  Nitric Oxide Unknown 180 153 156 162  237 158 43 22 32 25 31  ACQ score through march 2020 and then ACTfrom oct 2021   3.6   2.2  1.2 1.8 0 0 24  24     Ref. Range 05/10/2009 04:32 10/07/2016 13:11 10/07/2016 20:51 03/19/2017 15:45 08/08/2017 11:32  Eosinophils Absolute Latest Ref Range: 0.0 - 0.7 K/uL 0.1 0.4 0.3 0.3 0.0    Ref. Range 10/07/2016 13:11  IgEcoming (Immunoglobulin E), Serum Latest Ref Range: <115 kU/L 4        PFT     Latest Ref Rng & Units 11/29/2016   10:07 AM  PFT Results  FVC-Pre L 2.62   FVC-Predicted Pre % 90   FVC-Post L 2.50   FVC-Predicted Post % 86   Pre FEV1/FVC % % 79   Post FEV1/FCV % % 80   FEV1-Pre L 2.08   FEV1-Predicted Pre % 95   FEV1-Post L 2.00   DLCO uncorrected ml/min/mmHg 20.03   DLCO UNC% % 87   DLCO corrected ml/min/mmHg 19.74   DLCO COR %Predicted % 86   DLVA Predicted % 91   TLC L 5.05   TLC % Predicted % 103   RV % Predicted % 111        has a past medical history of Anemia, Anginal pain (HCC), Arthritis, Complication of anesthesia, Coronary artery disease, Dry eyes, Esophageal spasm, GERD (gastroesophageal reflux disease), and MS (multiple sclerosis) ([place]).   reports that she has never smoked. She has never used smokeless tobacco.  Past Surgical History:  Procedure Laterality Date   ABDOMINAL HYSTERECTOMY     BLEPHAROPLASTY Bilateral    CARDIAC CATHETERIZATION  10/04/06   MINOR CAD,SINGLE VESSEL INVOLVING THE CIRCUMFLEX. 20 TO 30% PROXIMALLY AND 10 TO 20% IN THE MIDDLE SEGMENT.MILD MUSCLE BRIDGING, MID LAD.NORMAL RCA.NORMAL LV FUNCTION.NORMAL MITRAL AND AORTIC VALVE.NORMAL APPEARING AORTA,THORACIC AND ABDOMINAL.NORMAL RENAL ARTERIES.   CARDIOLOGY NUCLEAR MED STUDY  06/22/12   NL LV FUNCTION,EF 68%,NL WALL MOTION.   CAROTID DUPLEX  07/02/11   AST:MHDQ SOFT PLAQUE NOTED DISTAL CCA AND ORGIN AND PROXIMAL ICA,LEFT>RIGHT.NO ICA STENOSIS. VERTEBRAL ARTERY FLOW IS ANTEGRADE.   CESAREAN SECTION     x2    COLONOSCOPY WITH PROPOFOL N/A 12/11/2014   Procedure: COLONOSCOPY  WITH PROPOFOL;  Surgeon: Ronald Lobo, MD;  Location: [facility];  Service: Endoscopy;  Laterality: N/A;   KNEE ARTHROSCOPY Left    scope   PARATHYROIDECTOMY     partial-many years ago   thumb surgery Bilateral    built up and bone removal   TONSILLECTOMY     TRANSTHORACIC ECHOCARDIOGRAM  07/02/11   LV CAVITY SIZE IS NORMAL. SYSTOLIC FUNCTION WAS NORMAL.EF=55% TO 60%.INCREASED RELATIVE CONTRIBUTION OF ATRIAL CONTRACTION TO VENTRICULAR FILLING;MAYBE DUE TO HYPOVOLEMIA. AV=MILD REGURG.    Allergies  Allergen Reactions   Crestor [Rosuvastatin] Other (See Comments)    Joint pain    Iodinated Contrast Media Other (See Comments)    Sneezing and itchy throat; dye was Isovue 300   Vicodin [Hydrocodone-Acetaminophen]     Made pass-out one time and is okay taking now   Isovue [Iopamidol]     Pt had sneezing and itching of her throat and soft palate.  Dr Alvester Chou checked pt.  She  will need premeds in the future.  J Bohm    Immunization History  Administered Date(s) Administered   Fluad Quad(high Dose 65+) 11/27/2018, 12/19/2019, 12/14/2020   Influenza, High Dose Seasonal PF 12/22/2013, 12/14/2015, 12/14/2016, 11/30/2017   Influenza,inj,Quad PF,6+ Mos 11/27/2014   PFIZER(Purple Top)SARS-COV-2 Vaccination 04/16/2019, 05/06/2019, 08/06/2020   Pneumococcal Polysaccharide-23 10/26/2013   Pneumococcal-Unspecified 10/26/2013   Tdap 06/14/2011   Zoster Recombinat (Shingrix) 11/13/2017, 02/08/2018    Family History  Problem Relation Age of Onset   Heart disease Mother    Asthma Father    Bladder Cancer Father    Heart failure Father    Hyperlipidemia Brother      Current Outpatient Medications:    albuterol (PROAIR HFA) 108 (90 Base) MCG/ACT inhaler, INHALE 1-2 PUFFS INTO THE LUNGS EVERY SIX HOURS AS NEEDED FOR WHEEZING OR SHORTNESS OF BREATH., Disp: 8.5 g, Rfl: 2   baclofen (LIORESAL) 10 MG tablet, Take 1/2 to 1 pill every night and 1 pill po qd prn spasticity, Disp: 60 each, Rfl: 11    budesonide-formoterol (SYMBICORT) 80-4.5 MCG/ACT inhaler, Inhale 2 puffs into the lungs in the morning and at bedtime., Disp: 10.2 g, Rfl: 12   Cholecalciferol (VITAMIN D PO), Take 1 tablet by mouth daily., Disp: , Rfl:    denosumab (PROLIA) 60 MG/ML SOSY injection, 60 mg, Disp: , Rfl:    diazepam (VALIUM) 2 MG tablet, Take 2 mg by mouth every 6 (six) hours as needed for anxiety., Disp: , Rfl:    diltiazem (CARDIZEM CD) 240 MG 24 hr capsule, TAKE (1) CAPSULE DAILY., Disp: 90 capsule, Rfl: 1   EPINEPHRINE 0.3 mg/0.3 mL IJ SOAJ injection, USE AS DIRECTED, Disp: 2 each, Rfl: 3   Evolocumab (REPATHA) 140 MG/ML SOSY, Inject into the skin., Disp: , Rfl:    ezetimibe (ZETIA) 10 MG tablet, 1 tablet, Disp: , Rfl:    gabapentin (NEURONTIN) 300 MG capsule, Take 300 mg by mouth daily as needed., Disp: , Rfl:    ipratropium-albuterol (DUONEB) 0.5-2.5 (3) MG/3ML SOLN, Take 3 mLs by nebulization 2 (two) times daily. (Patient taking differently: Take 3 mLs by nebulization 2 (two) times daily. Pt takes as needed), Disp: 360 mL, Rfl: 5   isosorbide mononitrate (IMDUR) 60 MG 24 hr tablet, TAKE 1&1/2 TABLETS ONCE DAILY., Disp: 135 tablet, Rfl: 3   losartan (COZAAR) 50 MG tablet, TAKE 1&1/2 TABLETS ONCE DAILY., Disp: 135 tablet, Rfl: 1   meclizine (ANTIVERT) 25 MG tablet, Take 1 tablet (25 mg total) by mouth 3 (three) times daily as needed for dizziness., Disp: 30 tablet, Rfl: 0   nitroGLYCERIN (NITROSTAT) 0.4 MG SL tablet, PLACE 1 TAB UNDER THE TONGUE AS NEEDED FOR CHEST PAIN MAY REPEAT EVERY 5 MINUTES., Disp: 25 tablet, Rfl: 3   pantoprazole (PROTONIX) 20 MG tablet, TAKE 1 TABLET BY MOUTH TWICE DAILY., Disp: 60 tablet, Rfl: 0   PARoxetine (PAXIL) 10 MG tablet, Take 5 mg by mouth daily. , Disp: , Rfl:    Respiratory Therapy Supplies (FLUTTER) DEVI, Use as directed, Disp: 1 each, Rfl: 0      Objective:   Vitals:   11/08/21 1347  BP: 122/68  Pulse: 65  Temp: 97.7 F (36.5 C)  TempSrc: Oral  SpO2: 97%   Weight: 141 lb 3.2 oz (64 kg)  Height: 5' 2.5" (1.588 m)    Estimated body mass index is 25.41 kg/m as calculated from the following:   Height as of this encounter: 5' 2.5" (1.588 m).   Weight as of  this encounter: 141 lb 3.2 oz (64 kg).  '@WEIGHTCHANGE'$ @  [employer]   11/08/21 1347  Weight: 141 lb 3.2 oz (64 kg)     Physical Exam    General: No distress. Looks welll Neuro: Alert and Oriented x 3. GCS 15. Speech normal Psych: Pleasant Resp:  Barrel Chest - bno  Wheeze -noi, Crackles - no, No overt respiratory distress CVS: Normal heart sounds. Murmurs - no Ext: Stigmata of Connective Tissue Disease - no HEENT: Normal upper airway. PEERL +. No post nasal drip        Assessment:       ICD-10-CM   1. Moderate persistent asthma, unspecified whether complicated  T01.77 Nitric oxide    2. Eosinophilic asthma  L39.03     3. Vaccine counseling  Z71.85          Plan:     Patient Instructions     ICD-10-CM   1. Moderate persistent asthma, unspecified whether complicated  E09.23   2. Eosinophilic asthma  R00.76      asthma is been under excellent control symbicort  and being off dupixent x 3 months  Plan -continue symbicort as before -Use albuterol as needed - high dose flu shot in fall - Consider RSV and covid vaccine when released  Follow-up -6 months do exhaled nitric oxide testing  = Return to see Dr [name] in 6 months or sooner if needed  - ACT/FEno at follow-up    SIGNATURE    Dr. Brand Males, M.D., F.C.C.P,  Pulmonary and Critical Care Medicine Staff Physician, [place] Director - Interstitial Lung Disease  Program  Pulmonary [facility] [place], [place], [address]  Pager: [phone], If no answer or between  15:00h - 7:00h: call [phone] Telephone: [phone]  2:19 PM 11/08/2021

## 2021-11-08 NOTE — Patient Instructions (Addendum)
ICD-10-CM   1. Moderate persistent asthma, unspecified whether complicated  O36.06   2. Eosinophilic asthma  V70.34      asthma is been under excellent control symbicort  and being off dupixent x 3 months  Plan -continue symbicort as before -Use albuterol as needed - high dose flu shot in fall - Consider RSV and covid vaccine when released  Follow-up -6 months do exhaled nitric oxide testing  = Return to see Dr [name] in 6 months or sooner if needed  - ACT/FEno at follow-up

## 2021-11-08 NOTE — Addendum Note (Signed)
Addended by: Lorretta Harp on: 11/08/2021 02:36 PM   Modules accepted: Orders

## 2021-11-11 DIAGNOSIS — M7061 Trochanteric bursitis, right hip: Secondary | ICD-10-CM | POA: Diagnosis not present

## 2021-11-18 DIAGNOSIS — J4541 Moderate persistent asthma with (acute) exacerbation: Secondary | ICD-10-CM | POA: Diagnosis not present

## 2021-11-18 DIAGNOSIS — J069 Acute upper respiratory infection, unspecified: Secondary | ICD-10-CM | POA: Diagnosis not present

## 2021-11-18 DIAGNOSIS — B354 Tinea corporis: Secondary | ICD-10-CM | POA: Diagnosis not present

## 2021-11-18 DIAGNOSIS — J9801 Acute bronchospasm: Secondary | ICD-10-CM | POA: Diagnosis not present

## 2021-11-18 DIAGNOSIS — K121 Other forms of stomatitis: Secondary | ICD-10-CM | POA: Diagnosis not present

## 2021-11-19 DIAGNOSIS — J189 Pneumonia, unspecified organism: Secondary | ICD-10-CM | POA: Diagnosis not present

## 2021-11-19 DIAGNOSIS — R61 Generalized hyperhidrosis: Secondary | ICD-10-CM | POA: Diagnosis not present

## 2021-11-19 DIAGNOSIS — J4531 Mild persistent asthma with (acute) exacerbation: Secondary | ICD-10-CM | POA: Diagnosis not present

## 2021-11-19 DIAGNOSIS — B370 Candidal stomatitis: Secondary | ICD-10-CM | POA: Diagnosis not present

## 2021-11-22 ENCOUNTER — Ambulatory Visit: Payer: Medicare PPO | Admitting: Family Medicine

## 2021-11-23 ENCOUNTER — Ambulatory Visit: Payer: Medicare PPO | Admitting: Pulmonary Disease

## 2021-11-23 ENCOUNTER — Encounter: Payer: Self-pay | Admitting: Pulmonary Disease

## 2021-11-23 VITALS — BP 134/70 | HR 85 | Ht 62.5 in | Wt 139.4 lb

## 2021-11-23 DIAGNOSIS — J4551 Severe persistent asthma with (acute) exacerbation: Secondary | ICD-10-CM | POA: Diagnosis not present

## 2021-11-23 DIAGNOSIS — J159 Unspecified bacterial pneumonia: Secondary | ICD-10-CM | POA: Diagnosis not present

## 2021-11-23 DIAGNOSIS — J4541 Moderate persistent asthma with (acute) exacerbation: Secondary | ICD-10-CM | POA: Diagnosis not present

## 2021-11-23 DIAGNOSIS — R0602 Shortness of breath: Secondary | ICD-10-CM | POA: Diagnosis not present

## 2021-11-23 DIAGNOSIS — J9801 Acute bronchospasm: Secondary | ICD-10-CM | POA: Diagnosis not present

## 2021-11-23 DIAGNOSIS — J189 Pneumonia, unspecified organism: Secondary | ICD-10-CM | POA: Diagnosis not present

## 2021-11-23 DIAGNOSIS — R0902 Hypoxemia: Secondary | ICD-10-CM | POA: Diagnosis not present

## 2021-11-23 MED ORDER — PREDNISONE 10 MG PO TABS: ORAL_TABLET | ORAL | 0 refills | Status: AC

## 2021-11-23 MED ORDER — LEVOFLOXACIN 500 MG PO TABS: 500.0000 mg | ORAL_TABLET | Freq: Every day | ORAL | 0 refills | Status: DC

## 2021-11-23 NOTE — Patient Instructions (Addendum)
Severe persistent asthma in exacerbation secondary to RLL pneumonia --START levaquin 500 mg x 7 days --START prednisone taper  Follow-up with [name] (no NP) in 1 month.

## 2021-11-23 NOTE — Progress Notes (Signed)
Subjective:   PATIENT ID: [name] GENDER: female DOB: October 25, 1945, MRN: [id number]   HPI  Chief Complaint  Patient presents with   Acute Visit    Bacterial Pneumonia currently being treated. Finished antibiotics today     Reason for Visit: Acute Follow-up   She was recently seen by Dr. Onnie Graham for asthma on 11/08/21. Daughter is present.  She reports cough that began 5 days ago. PCP evaluated with CXR 8/25 demonstrating RLL pneumonia. At night her oxygen was 86% last night. Unchanged productive sputum with light yellow/white thick sputum. Reports wheezing. She received a steroid shot. She was treated with Zpack  She is compliant with Symbicort and albuterol twice a day  I have personally reviewed patient's past medical/family/social history, allergies, current medications.  Past Medical History:  Diagnosis Date   Anemia    past history-many yrs ago   Anginal pain ([place])    being evaluated by Dr. Tyrone Sage, arm pain,"bad indigestion" -no heart related findings as of yet   Arthritis    hip. back pain   Complication of anesthesia    s/p Hysterectomy "vagal response "heart stopped" -did not require shocking.   Coronary artery disease    Dry eyes    Esophageal spasm    GERD (gastroesophageal reflux disease)    MS (multiple sclerosis) ([place])    stable-sees [name] every 6 months     Family History  Problem Relation Age of Onset   Heart disease Mother    Asthma Father    Bladder Cancer Father    Heart failure Father    Hyperlipidemia Brother      Social History   Occupational History   Occupation: retired  Tobacco Use   Smoking status: Never   Smokeless tobacco: Never  Vaping Use   Vaping Use: Never used  Substance and Sexual Activity   Alcohol use: Yes    Alcohol/week: 0.0 standard drinks of alcohol    Comment: wine occ.   Drug use: No   Sexual activity: Not on file    Allergies  Allergen Reactions   Crestor [Rosuvastatin] Other (See  Comments)    Joint pain    Iodinated Contrast Media Other (See Comments)    Sneezing and itchy throat; dye was Isovue 300   Vicodin [Hydrocodone-Acetaminophen]     Made pass-out one time and is okay taking now   Isovue [Iopamidol]     Pt had sneezing and itching of her throat and soft palate.  Dr Alvester Chou checked pt.  She will need premeds in the future.  J Bohm     Outpatient Medications Prior to Visit  Medication Sig Dispense Refill   albuterol (PROAIR HFA) 108 (90 Base) MCG/ACT inhaler INHALE 1-2 PUFFS INTO THE LUNGS EVERY SIX HOURS AS NEEDED FOR WHEEZING OR SHORTNESS OF BREATH. 8.5 g 2   baclofen (LIORESAL) 10 MG tablet Take 1/2 to 1 pill every night and 1 pill po qd prn spasticity 60 each 11   budesonide-formoterol (SYMBICORT) 80-4.5 MCG/ACT inhaler Inhale 2 puffs into the lungs in the morning and at bedtime. 10.2 g 12   Cholecalciferol (VITAMIN D PO) Take 1 tablet by mouth daily.     denosumab (PROLIA) 60 MG/ML SOSY injection 60 mg     diazepam (VALIUM) 2 MG tablet Take 2 mg by mouth every 6 (six) hours as needed for anxiety.     diltiazem (CARDIZEM CD) 240 MG 24 hr capsule TAKE (1) CAPSULE DAILY. 90 capsule 1  EPINEPHRINE 0.3 mg/0.3 mL IJ SOAJ injection USE AS DIRECTED 2 each 3   Evolocumab (REPATHA) 140 MG/ML SOSY Inject into the skin.     ezetimibe (ZETIA) 10 MG tablet 1 tablet     gabapentin (NEURONTIN) 300 MG capsule Take 300 mg by mouth daily as needed.     ipratropium-albuterol (DUONEB) 0.5-2.5 (3) MG/3ML SOLN Take 3 mLs by nebulization 2 (two) times daily. (Patient taking differently: Take 3 mLs by nebulization 2 (two) times daily. Pt takes as needed) 360 mL 5   isosorbide mononitrate (IMDUR) 60 MG 24 hr tablet TAKE 1&1/2 TABLETS ONCE DAILY. 135 tablet 3   losartan (COZAAR) 50 MG tablet TAKE 1&1/2 TABLETS ONCE DAILY. 135 tablet 1   meclizine (ANTIVERT) 25 MG tablet Take 1 tablet (25 mg total) by mouth 3 (three) times daily as needed for dizziness. 30 tablet 0   nitroGLYCERIN  (NITROSTAT) 0.4 MG SL tablet PLACE 1 TAB UNDER THE TONGUE AS NEEDED FOR CHEST PAIN MAY REPEAT EVERY 5 MINUTES. 25 tablet 3   pantoprazole (PROTONIX) 20 MG tablet TAKE 1 TABLET BY MOUTH TWICE DAILY. 60 tablet 0   PARoxetine (PAXIL) 10 MG tablet Take 5 mg by mouth daily.      Respiratory Therapy Supplies (FLUTTER) DEVI Use as directed 1 each 0   No facility-administered medications prior to visit.    Review of Systems  Constitutional:  Negative for chills, diaphoresis, fever, malaise/fatigue and weight loss.  HENT:  Negative for congestion.   Respiratory:  Positive for cough, sputum production, shortness of breath and wheezing. Negative for hemoptysis.   Cardiovascular:  Negative for chest pain, palpitations and leg swelling.     Objective:   Vitals:   11/23/21 1627  Pulse: 85  SpO2: 97%  Weight: 139 lb 6.4 oz (63.2 kg)  Height: 5' 2.5" (1.588 m)   SpO2: 97 %  Physical Exam: General: Well-appearing, no acute distress HENT: [place], AT Eyes: EOMI, no scleral icterus Respiratory: Clear to auscultation bilaterally.  No crackles, wheezing or rales Cardiovascular: RRR, -M/R/G, no JVD Extremities:-Edema,-tenderness Neuro: AAO x4, CNII-XII grossly intact Psych: Normal mood, normal affect     Assessment & Plan:   Discussion:  Severe persistent asthma in exacerbation secondary to RLL pneumonia Recent OSH CXR with RLL infiltrate. Failed azithromycin --START levaquin 500 mg x 7 days --START prednisone taper  Health Maintenance Immunization History  Administered Date(s) Administered   Fluad Quad(high Dose 65+) 11/27/2018, 12/19/2019, 12/14/2020   Influenza, High Dose Seasonal PF 12/22/2013, 12/14/2015, 12/14/2016, 11/30/2017   Influenza,inj,Quad PF,6+ Mos 11/27/2014   PFIZER(Purple Top)SARS-COV-2 Vaccination 04/16/2019, 05/06/2019, 08/06/2020   Pneumococcal Polysaccharide-23 10/26/2013   Pneumococcal-Unspecified 10/26/2013   Tdap 06/14/2011   Zoster Recombinat (Shingrix)  11/13/2017, 02/08/2018    No orders of the defined types were placed in this encounter.  Meds ordered this encounter  Medications   predniSONE (DELTASONE) 10 MG tablet    Sig: Take 4 tablets (40 mg total) by mouth daily with breakfast for 2 days, THEN 3 tablets (30 mg total) daily with breakfast for 2 days, THEN 2 tablets (20 mg total) daily with breakfast for 2 days, THEN 1 tablet (10 mg total) daily with breakfast for 2 days.    Dispense:  20 tablet    Refill:  0   levofloxacin (LEVAQUIN) 500 MG tablet    Sig: Take 1 tablet (500 mg total) by mouth daily.    Dispense:  7 tablet    Refill:  0    Return in  about 1 month (around 12/24/2021).  I have spent a total time of 30-minutes on the day of the appointment reviewing prior documentation, coordinating care and discussing medical diagnosis and plan with the patient/family. Imaging, labs and tests included in this note have been reviewed and interpreted independently by me.  [place], MD [facility] 11/23/2021 4:30 PM  Office Number [phone]

## 2021-11-26 ENCOUNTER — Other Ambulatory Visit: Payer: Self-pay | Admitting: Physician Assistant

## 2021-11-29 ENCOUNTER — Encounter: Payer: Self-pay | Admitting: Pulmonary Disease

## 2021-12-07 DIAGNOSIS — M810 Age-related osteoporosis without current pathological fracture: Secondary | ICD-10-CM | POA: Diagnosis not present

## 2021-12-11 ENCOUNTER — Other Ambulatory Visit: Payer: Self-pay | Admitting: Cardiovascular Disease

## 2021-12-13 DIAGNOSIS — E038 Other specified hypothyroidism: Secondary | ICD-10-CM | POA: Diagnosis not present

## 2021-12-13 DIAGNOSIS — E039 Hypothyroidism, unspecified: Secondary | ICD-10-CM | POA: Diagnosis not present

## 2021-12-13 DIAGNOSIS — M810 Age-related osteoporosis without current pathological fracture: Secondary | ICD-10-CM | POA: Diagnosis not present

## 2021-12-13 DIAGNOSIS — E042 Nontoxic multinodular goiter: Secondary | ICD-10-CM | POA: Diagnosis not present

## 2021-12-14 ENCOUNTER — Ambulatory Visit (INDEPENDENT_AMBULATORY_CARE_PROVIDER_SITE_OTHER): Payer: Medicare PPO

## 2021-12-14 ENCOUNTER — Ambulatory Visit: Payer: Medicare PPO | Admitting: Adult Health

## 2021-12-14 ENCOUNTER — Encounter: Payer: Self-pay | Admitting: Adult Health

## 2021-12-14 VITALS — BP 120/62 | HR 69 | Ht 62.0 in | Wt 142.0 lb

## 2021-12-14 DIAGNOSIS — J159 Unspecified bacterial pneumonia: Secondary | ICD-10-CM

## 2021-12-14 DIAGNOSIS — Z23 Encounter for immunization: Secondary | ICD-10-CM

## 2021-12-14 DIAGNOSIS — J189 Pneumonia, unspecified organism: Secondary | ICD-10-CM | POA: Diagnosis not present

## 2021-12-14 DIAGNOSIS — J4540 Moderate persistent asthma, uncomplicated: Secondary | ICD-10-CM | POA: Diagnosis not present

## 2021-12-14 LAB — POCT EXHALED NITRIC OXIDE: FeNO level (ppb): 21

## 2021-12-14 MED ORDER — ALBUTEROL SULFATE HFA 108 (90 BASE) MCG/ACT IN AERS: 2.0000 | INHALATION_SPRAY | Freq: Four times a day (QID) | RESPIRATORY_TRACT | 2 refills | Status: DC | PRN

## 2021-12-14 NOTE — Progress Notes (Signed)
$'@Patient'Z$  ID: [name], female    DOB: 1945/08/27, 76 y.o.   MRN: [id number]  Chief Complaint  Patient presents with   Follow-up    Referring provider: [name], MD  HPI: 76 year old female followed for severe persistent asthma  TEST/EVENTS :   12/14/2021 Follow up : Asthma, pneumonia Patient returns for a 3-week follow-up.  Patient was seen last visit for an asthmatic exacerbation and right lower lobe pneumonia.  Patient was treated with 7-day course of Levaquin and a prednisone taper.  Since last visit she is starting to feel better but continues to have lingering cough.  She is using Best boy for cough. Appetite is good with no nausea vomiting diarrhea.  Denies any hemoptysis chest pain orthopnea PND or leg swelling.  She remains on Symbicort twice daily.  Says she is still having to use her albuterol a couple times a day. Chest x-ray today shows mild interstitial markings, no acute process noted. Exhaled nitric oxide testing today is 21 ppb     Allergies  Allergen Reactions   Crestor [Rosuvastatin] Other (See Comments)    Joint pain    Iodinated Contrast Media Other (See Comments)    Sneezing and itchy throat; dye was Isovue 300   Vicodin [Hydrocodone-Acetaminophen]     Made pass-out one time and is okay taking now   Isovue [Iopamidol]     Pt had sneezing and itching of her throat and soft palate.  Dr Alvester Chou checked pt.  She will need premeds in the future.  J Bohm    Immunization History  Administered Date(s) Administered   Fluad Quad(high Dose 65+) 11/27/2018, 12/19/2019, 12/14/2020   Influenza, High Dose Seasonal PF 12/22/2013, 12/14/2015, 12/14/2016, 11/30/2017   Influenza,inj,Quad PF,6+ Mos 11/27/2014   PFIZER(Purple Top)SARS-COV-2 Vaccination 04/16/2019, 05/06/2019, 08/06/2020   Pneumococcal Polysaccharide-23 10/26/2013   Pneumococcal-Unspecified 10/26/2013   Tdap 06/14/2011   Zoster Recombinat (Shingrix) 11/13/2017, 02/08/2018    Past  Medical History:  Diagnosis Date   Anemia    past history-many yrs ago   Anginal pain ([place])    being evaluated by Dr. Tyrone Sage, arm pain,"bad indigestion" -no heart related findings as of yet   Arthritis    hip. back pain   Complication of anesthesia    s/p Hysterectomy "vagal response "heart stopped" -did not require shocking.   Coronary artery disease    Dry eyes    Esophageal spasm    GERD (gastroesophageal reflux disease)    MS (multiple sclerosis) ([place])    stable-sees [name] every 6 months    Tobacco History: Social History   Tobacco Use  Smoking Status Never  Smokeless Tobacco Never   Counseling given: Not Answered   Outpatient Medications Prior to Visit  Medication Sig Dispense Refill   albuterol (PROAIR HFA) 108 (90 Base) MCG/ACT inhaler INHALE 1-2 PUFFS INTO THE LUNGS EVERY SIX HOURS AS NEEDED FOR WHEEZING OR SHORTNESS OF BREATH. 8.5 g 2   budesonide-formoterol (SYMBICORT) 80-4.5 MCG/ACT inhaler Inhale 2 puffs into the lungs in the morning and at bedtime. 10.2 g 12   ipratropium-albuterol (DUONEB) 0.5-2.5 (3) MG/3ML SOLN Take 3 mLs by nebulization 2 (two) times daily. (Patient taking differently: Take 3 mLs by nebulization 2 (two) times daily. Pt takes as needed) 360 mL 5   Respiratory Therapy Supplies (FLUTTER) DEVI Use as directed 1 each 0   baclofen (LIORESAL) 10 MG tablet Take 1/2 to 1 pill every night and 1 pill po qd prn spasticity 60 each 11  Cholecalciferol (VITAMIN D PO) Take 1 tablet by mouth daily.     denosumab (PROLIA) 60 MG/ML SOSY injection 60 mg     diazepam (VALIUM) 2 MG tablet Take 2 mg by mouth every 6 (six) hours as needed for anxiety.     diltiazem (CARDIZEM CD) 240 MG 24 hr capsule TAKE ONE CAPSULE BY MOUTH DAILY 90 capsule 1   EPINEPHRINE 0.3 mg/0.3 mL IJ SOAJ injection USE AS DIRECTED 2 each 3   Evolocumab (REPATHA) 140 MG/ML SOSY Inject into the skin.     ezetimibe (ZETIA) 10 MG tablet 1 tablet     gabapentin (NEURONTIN) 300 MG capsule  Take 300 mg by mouth daily as needed.     isosorbide mononitrate (IMDUR) 60 MG 24 hr tablet TAKE 1&1/2 TABLETS ONCE DAILY. 135 tablet 3   levofloxacin (LEVAQUIN) 500 MG tablet Take 1 tablet (500 mg total) by mouth daily. 7 tablet 0   losartan (COZAAR) 50 MG tablet Take 1 tablet (50 mg total) by mouth daily. Schedule an appointment for further refills,1st attempt 90 tablet 0   meclizine (ANTIVERT) 25 MG tablet Take 1 tablet (25 mg total) by mouth 3 (three) times daily as needed for dizziness. 30 tablet 0   nitroGLYCERIN (NITROSTAT) 0.4 MG SL tablet PLACE 1 TABLET UNDER THE TONGUE AS NEEDED FOR CHEST PAIN, MAY REPEAT EVERY 5 MINUTES. 25 tablet 3   pantoprazole (PROTONIX) 20 MG tablet TAKE 1 TABLET BY MOUTH TWICE DAILY. 60 tablet 0   PARoxetine (PAXIL) 10 MG tablet Take 5 mg by mouth daily.      No facility-administered medications prior to visit.     Review of Systems:   Constitutional:   No  weight loss, night sweats,  Fevers, chills, fatigue, or  lassitude.  HEENT:   No headaches,  Difficulty swallowing,  Tooth/dental problems, or  Sore throat,                No sneezing, itching, ear ache, + nasal congestion, post nasal drip,   CV:  No chest pain,  Orthopnea, PND, swelling in lower extremities, anasarca, dizziness, palpitations, syncope.   GI  No heartburn, indigestion, abdominal pain, nausea, vomiting, diarrhea, change in bowel habits, loss of appetite, bloody stools.   Resp:   No chest wall deformity  Skin: no rash or lesions.  GU: no dysuria, change in color of urine, no urgency or frequency.  No flank pain, no hematuria   MS:  No joint pain or swelling.  No decreased range of motion.  No back pain.    Physical Exam  BP 120/62 (BP Location: Left Arm)   Pulse 69   Ht '5\' 2"'$  (1.575 m)   Wt 142 lb (64.4 kg)   SpO2 93%   BMI 25.97 kg/m   GEN: A/Ox3; pleasant , NAD, well nourished    HEENT:  [place]/AT,  NOSE-clear, THROAT-clear, no lesions, no postnasal drip or exudate noted.    NECK:  Supple w/ fair ROM; no JVD; normal carotid impulses w/o bruits; no thyromegaly or nodules palpated; no lymphadenopathy.    RESP  Clear  P & A; w/o, wheezes/ rales/ or rhonchi. no accessory muscle use, no dullness to percussion  CARD:  RRR, no m/r/g, no peripheral edema, pulses intact, no cyanosis or clubbing.  GI:   Soft & nt; nml bowel sounds; no organomegaly or masses detected.   Musco: Warm bil, no deformities or joint swelling noted.   Neuro: alert, no focal deficits noted.  Skin: Warm, no lesions or rashes    Lab Results:   BNP No results found for: "BNP"  ProBNP No results found for: "PROBNP"  Imaging: DG Chest 2 View  Result Date: 12/14/2021 CLINICAL DATA:  Right lower lobe pneumonia EXAM: CHEST - 2 VIEW COMPARISON:  Chest two views 03/26/2021 and 08/03/2020 FINDINGS: Cardiac silhouette and mediastinal contours within normal limits. Mild calcification within aortic arch. Mild bibasilar interstitial thickening is unchanged from multiple prior radiographs and chronic. No focal airspace opacity to indicate pneumonia. No pleural effusion or pneumothorax. Mild multilevel degenerative disc changes of the thoracic spine. IMPRESSION: No significant change from prior. Mild bibasilar interstitial thickening. No acute pneumonia. Electronically Signed   By: Yvonne Kendall M.D.   On: 12/14/2021 15:01         Latest Ref Rng & Units 11/29/2016   10:07 AM  PFT Results  FVC-Pre L 2.62   FVC-Predicted Pre % 90   FVC-Post L 2.50   FVC-Predicted Post % 86   Pre FEV1/FVC % % 79   Post FEV1/FCV % % 80   FEV1-Pre L 2.08   FEV1-Predicted Pre % 95   FEV1-Post L 2.00   DLCO uncorrected ml/min/mmHg 20.03   DLCO UNC% % 87   DLCO corrected ml/min/mmHg 19.74   DLCO COR %Predicted % 86   DLVA Predicted % 91   TLC L 5.05   TLC % Predicted % 103   RV % Predicted % 111     Lab Results  Component Value Date   NITRICOXIDE 31 11/08/2021        Assessment & Plan:    Asthma Recent asthma exacerbation with associated pneumonia now improved after antibiotics and steroid taper. Patient is clinically improving.  Exhaled nitric oxide testing today remains normal Would continue on asthma maintenance regimen with Symbicort.  Restart Zyrtec for trigger prevention.  Plan  Patient Instructions  Continue on Symbicort 2 puffs twice daily, rinse after use Restart Zyrtec 10 mg daily Albuterol inhaler or nebulizer as needed Asthma action plan as discussed Flu shot today Follow-up in 2 months with [name] as planned and as needed       Bacterial pneumonia Recent right lower lobe pneumonia treated with antibiotics.  Clinically is improving.  Chest x-ray today shows no acute process.  Plan  Patient Instructions  Continue on Symbicort 2 puffs twice daily, rinse after use Restart Zyrtec 10 mg daily Albuterol inhaler or nebulizer as needed Asthma action plan as discussed Flu shot today Follow-up in 2 months with [name] as planned and as needed         Rexene Edison, NP 12/14/2021

## 2021-12-14 NOTE — Assessment & Plan Note (Signed)
Recent right lower lobe pneumonia treated with antibiotics.  Clinically is improving.  Chest x-ray today shows no acute process.  Plan  Patient Instructions  Continue on Symbicort 2 puffs twice daily, rinse after use Restart Zyrtec 10 mg daily Albuterol inhaler or nebulizer as needed Asthma action plan as discussed Flu shot today Follow-up in 2 months with [name] as planned and as needed

## 2021-12-14 NOTE — Patient Instructions (Signed)
Continue on Symbicort 2 puffs twice daily, rinse after use Restart Zyrtec 10 mg daily Albuterol inhaler or nebulizer as needed Asthma action plan as discussed Flu shot today Follow-up in 2 months with [name] as planned and as needed

## 2021-12-14 NOTE — Assessment & Plan Note (Signed)
Recent asthma exacerbation with associated pneumonia now improved after antibiotics and steroid taper. Patient is clinically improving.  Exhaled nitric oxide testing today remains normal Would continue on asthma maintenance regimen with Symbicort.  Restart Zyrtec for trigger prevention.  Plan  Patient Instructions  Continue on Symbicort 2 puffs twice daily, rinse after use Restart Zyrtec 10 mg daily Albuterol inhaler or nebulizer as needed Asthma action plan as discussed Flu shot today Follow-up in 2 months with [name] as planned and as needed

## 2021-12-15 ENCOUNTER — Other Ambulatory Visit: Payer: Self-pay | Admitting: Home Modifications

## 2021-12-15 DIAGNOSIS — E042 Nontoxic multinodular goiter: Secondary | ICD-10-CM

## 2021-12-15 DIAGNOSIS — M810 Age-related osteoporosis without current pathological fracture: Secondary | ICD-10-CM | POA: Diagnosis not present

## 2021-12-17 ENCOUNTER — Ambulatory Visit
Admission: RE | Admit: 2021-12-17 | Discharge: 2021-12-17 | Disposition: A | Discharge status: Home / Self Care | Payer: Medicare PPO | Source: Ambulatory Visit | Attending: Home Modifications | Admitting: Home Modifications

## 2021-12-17 DIAGNOSIS — E042 Nontoxic multinodular goiter: Secondary | ICD-10-CM | POA: Diagnosis not present

## 2021-12-20 DIAGNOSIS — E041 Nontoxic single thyroid nodule: Secondary | ICD-10-CM | POA: Diagnosis not present

## 2021-12-20 DIAGNOSIS — J4541 Moderate persistent asthma with (acute) exacerbation: Secondary | ICD-10-CM | POA: Diagnosis not present

## 2021-12-20 DIAGNOSIS — M810 Age-related osteoporosis without current pathological fracture: Secondary | ICD-10-CM | POA: Diagnosis not present

## 2021-12-31 ENCOUNTER — Other Ambulatory Visit: Payer: Self-pay | Admitting: Adult Health

## 2022-01-13 ENCOUNTER — Telehealth: Payer: Self-pay | Admitting: Cardiovascular Disease

## 2022-01-13 ENCOUNTER — Other Ambulatory Visit: Payer: Self-pay | Admitting: Cardiovascular Disease

## 2022-01-13 NOTE — Telephone Encounter (Signed)
Pt c/o medication issue:  1. Name of Medication: losartan (COZAAR) 50 MG tablet  2. How are you currently taking this medication (dosage and times per day)? 1 1/2 tab per day  3. Are you having a reaction (difficulty breathing--STAT)?   4. What is your medication issue? Patient states that instructions called in for this medication came in as to take 1 tab per day, but her past RX instructions were for 1 1/2 per day. She would like a call back for clarification for this medication. She states that it is a bit concerning that she was not made aware of change, or that someone could make such an error, if that is what happened. Please advise.

## 2022-01-13 NOTE — Telephone Encounter (Signed)
Routed to refill team. Per review of chart, was on losartan '50mg'$  - take 1.5 tablets QD previously

## 2022-01-14 MED ORDER — LOSARTAN POTASSIUM 50 MG PO TABS: ORAL_TABLET | ORAL | 2 refills | Status: DC

## 2022-01-19 ENCOUNTER — Telehealth: Payer: Self-pay | Admitting: Cardiovascular Disease

## 2022-01-19 NOTE — Telephone Encounter (Signed)
Pt c/o medication issue:  1. Name of Medication: losartan (COZAAR) 50 MG tablet  2. How are you currently taking this medication (dosage and times per day)? 1 1/2 tablets daily.   3. Are you having a reaction (difficulty breathing--STAT)?   4. What is your medication issue? Patient wants to know why the directions were switch to 1 tablet daily, when it should be 1 1/2 tablets daily.

## 2022-01-19 NOTE — Telephone Encounter (Signed)
Patient had a "concern" about the medication losartan she received from her pharmacy. She stated her pharmacy told her the prescription for losartan was '50mg'$  daily from our clinic, dated 12/13/21. I explained that the prescription is for losartan '50mg'$  tablets, to take 1 and 1/2 tablets daily. She repeated that a mistake was made by our office. Before we completed our call, I had her tell me how she was taking the medication. She is taking losartan '75mg'$  daily.

## 2022-01-21 ENCOUNTER — Ambulatory Visit: Payer: Medicare PPO | Admitting: Internal Medicine

## 2022-01-28 ENCOUNTER — Ambulatory Visit: Payer: Medicare PPO | Admitting: Internal Medicine

## 2022-01-28 ENCOUNTER — Encounter: Payer: Self-pay | Admitting: Internal Medicine

## 2022-01-28 VITALS — BP 114/64 | HR 71 | Temp 97.9°F | Ht 62.0 in | Wt 145.0 lb

## 2022-01-28 DIAGNOSIS — J4540 Moderate persistent asthma, uncomplicated: Secondary | ICD-10-CM

## 2022-01-28 DIAGNOSIS — J8283 Eosinophilic asthma: Secondary | ICD-10-CM | POA: Diagnosis not present

## 2022-01-28 NOTE — Progress Notes (Signed)
OV 11/30/2017  Subjective:  Patient ID: [name], female , DOB: 06/02/1945 , age 76 y.o. , MRN: [id number] , ADDRESS: [facility] [address]   11/30/2017 -   Chief Complaint  Patient presents with   Follow-up    Pt states she has been doing good since last visit except states she has had a headache x2 days and has had a cough. Pt also has c/o chest tightness.     HPI [name] 76 y.o. -follow-up severe persistent asthma with poorly controlled symptoms. She is on Dulera, Singulair and biologic Nucala. She reports that currently it is one of her better days and weeks. Despite that asthma control questionnaire shows significant symptom with the 5 point score of 1.8. She says she is not waking up in the middle of the night because of asthma when she wakes up she is very mild symptoms when she is very slightly limited in her activities and she is moderately short of breath and wheezing a lot of the time but not using much albuterol for rescue. This is despite compliance. Last visit we switched her to [place] but this is stuck with insurance pre-authorization required and that is some delays with this. In addition she also tells me that at every visit she's been giving sputum samples at our lab basementand she is unclear why no one called her with results. I have told her that I was unaware that this was going on. Review of the charts indicate sputum samples given October 2018 in April 2019. Along and consistent with a respiratory in asthma symptoms at that early morning she has cough with yellow sputum which is consistent with high airway eosinophil load as evidenced by significantly high exhaled nitric oxide is despite biologic therapy.  [name]  ROS - per HPI       OV 03/01/2018  Subjective:  Patient ID: [name], female , DOB: Mar 20, 1946 , age 33 y.o. , MRN: [id number] , ADDRESS: [place] [facility] [id number]   03/01/2018 -   Chief Complaint   Patient presents with   Follow-up    f/u asthma, no wheezing since dupixent     HPI [name] 75 y.o. -  Follow-up severe eosinophilic asthma on Dupixent.  She is taking the shots in her arm every 2 weeks.  This is been going on for 8 weeks or so.  She reports excellent improvement in her asthma.  Asthma control questionnaire 0 out of 5.  Significant improvement in her exam nitric oxide today going from the mid 100s to 43.  She continues to be on Dulera and Singulair.  She is asking if she can de-escalate any of this therapy.  She is not waking up in the middle of the night with asthma.  She not having any symptoms when she wakes up.  Not limited in her activities.  When she wakes up she not short of breath.  No wheezing.  She not using albuterol for rescue.  She thinks she might be getting a cold but she is not sure.  Nitric oxide and symptom profile shows huge improvements.  However she is wondering if she is having side effects from the dupilumab.  She is reporting some blurred vision and needing to use reading glasses more so after starting the dupilumab.  This is temporally correlated.  Review of the literature shows conjunctivitis and pruritus reported at low percentages but not blurred vision.  OV 05/31/2018  Subjective:  Patient ID: [name], female , DOB: February 27, 1946 , age 33 y.o. , MRN: [id number] , ADDRESS: [facility] [address]   05/31/2018 -   Chief Complaint  Patient presents with   Follow-up    Pt states she has been doing okay since last visit and denies any current complaints of cough, SOB, or CP     HPI [name] 76 y.o. -presents for follow-up.  She has severe asthma with eosinophilia.  Finally she is on Dupixent/in December 2019 she had asthma exacerbation and ended up in the ER and needed prednisone but after that she is been doing well.  Asthma control question is 0.  She not waking up in the middle of the night with symptoms when she wakes  up she is asymptomatic.  No limitations in activities no albuterol rescue use.  No wheezing no shortness of breath no cough.  Asthma control question is 0.  Exam nitric oxide is further improved to 22.  However she wants to stop the Dupixent because she feels she is getting a brain fog although she states that it could be related to [place].  She also wants to start her MS medications and is worried about the effects of polypharmacy.  She also discussed because of the upcoming coronavirus 19 pandemic.           OV 10/14/2019  Subjective:  Patient ID: [name], female , DOB: 07/10/1945 , age 57 y.o. , MRN: [id number] , ADDRESS: [facility] [address]   10/14/2019 -   Chief Complaint  Patient presents with   Follow-up    Eosinophilic moderate to severe persistent asthma HPI [name] 76 y.o. -presents for follow-up.  I last saw her March 2020.  Then with the pandemic I did not see her.  She has been coming to the office and getting her Dupixent twice weekly.  She is on the higher dose.  She is not using any inhaled steroid.  She wants to know if she can reduce the dose of the Dupixent.  She is also on Zyrtec and Singulair.  She wants to know if she can stop this.  Asthma is completely under control and she is asymptomatic.        ROS  OV 12/14/2020  Subjective:  Patient ID: [name], female , DOB: [birth year] , age 43 y.o. , MRN: [id number] , ADDRESS: [place] [address] PCP [name], MD Patient Care Team: [name], MD as PCP - General (Internal Medicine) Troy Sine, MD as PCP - Cardiology (Cardiology)  This Provider for this visit: Treatment Team:  Attending Provider: Brand Males, MD    12/14/2020 -   Chief Complaint  Patient presents with   Follow-up    Pt states she has been doing okay since last visit. States she has had an occ dry cough which lasted about 10 days but then went away.    Eosinophilic  moderate to severe persistent asthma  HPI [name] 76 y.o. -last sseen by me July 2021 and then NP in OCt 2021. Asthma under excellent control based on ACT and feno. If she cuts grass or trimes bushes she gets cough. Has randon cough episodes occasionally. Otherwise well . Currentlyt on dupixent alone as single drug for asthma.  Discussed high dose flu shot and she will have it. Discussed covid mRNA bivalent booster -r ecommended she have it.  Never had covid before     Asthma Control Test ACT Total Score  12/14/2020 25  01/14/2020 24     Lab Results  Component Value Date   NITRICOXIDE 25 12/14/2020     OV 07/21/2021  Subjective:  Patient ID: [name], female , DOB: 1945/07/14 , age 46 y.o. , MRN: [id number] , ADDRESS: [place] [address] PCP [name], MD Patient Care Team: [name], MD as PCP - General (Internal Medicine) Troy Sine, MD as PCP - Cardiology (Cardiology)  This Provider for this visit: Treatment Team:  Attending Provider: Brand Males, MD    07/21/2021 -   Chief Complaint  Patient presents with   Follow-up    Pt states she has been doing good since last visit and denies any complaints. States that she has not had any recent flare ups.     HPI [name] 76 y.o. -follows up eosinophilic asthma.  She is on Dupixent low-dose.  She is also now on Symbicort.  She says she is doing really well.  Asthma control test questionnaire is normal.  Exam nitric oxide test is normal.  She wants to stop taking Dupixent.  This worked really well for her but she feels that she is under remission.  She feels her daughter [name] might be upset if she stopped the [place].  However we discussed this.  She has been on low-dose for a year.  We think we can take an expectant approach of stopping Dupixent and see how she does.  She is open to this.  And therefore we took decision to stop Dupixent but continue  Symbicort.   ACT  Asthma Control Test ACT Total Score  07/21/2021 11:34 AM 25  12/14/2020  1:50 PM 25  01/14/2020 10:02 AM 24    Lab Results  Component Value Date   NITRICOXIDE 18 07/21/2021       OV 11/08/2021  Subjective:  Patient ID: [name], female , DOB: May 16, 1945 , age 89 y.o. , MRN: [id number] , ADDRESS: [place] [phone] PCP [name], MD Patient Care Team: [name], MD as PCP - General (Internal Medicine) Troy Sine, MD as PCP - Cardiology (Cardiology)  This Provider for this visit: Treatment Team:  Attending Provider: Brand Males, MD    11/08/2021 -   Chief Complaint  Patient presents with   Follow-up    Pt states she has been doing okay since last visit and denies any complaints.     HPI [name] 76 y.o. -returns for follow-up.  She has eosinophilic asthma.  In April 2023 after years of being on [place] we stopped it.  She continues to do well.  Her exhaled nitric oxide is within normal range/slightly gray zone 31.  Otherwise feeling good.  She is up-to-date with her vaccines.  She wanted to know about RSV vaccine.  She is going to think about this.  She wanted know about COVID mRNA vaccine booster.  We discussed about the current strains in the community.  We took a shared decision making to evaluate the strains in the community for COVID and then decide on the COVID mRNA booster.  She is going to wait on the literature on RSV vaccine.  She will have the high-dose flu shot.  At this point in time we took a shared decision making to monitor her off [place] because asthma control is very good.     OV 01/28/2022  Subjective:  Patient ID: [name], female , DOB: Feb 26, 1946 , age 76 y.o. , MRN: [id number] , ADDRESS: [place] [id number] PCP [name], MD Patient Care Team: [name], MD as PCP - General (Internal Medicine) Troy Sine, MD as PCP - Cardiology  (Cardiology)  This Provider for this visit: Treatment Team:  Attending Provider: Brand Males, MD    01/28/2022 -   Chief Complaint  Patient presents with   Follow-up    Pt states she has been doing okay since last visit and denies any complaints.     HPI [name] 76 y.o. -returns for follow-up.  Since I last saw her she had 1 episode of "pneumonia".  Apparently x-ray was done by primary care physician.  She did see Dr. Rodman Pickle acutely in our office in August 2023.  And was given 7-day Levaquin and prednisone.  She followed up with nurse practitioner 12/14/2021 and the chest x-ray did not show pneumonia.  Currently doing well.  Asthma control test questionnaire shows excellent asthma control.  She is here with her daughter [name].  They are wondering if she should restart her Dupixent.  We discussed this in detail.  I certainly said that we could restart it now.  Alternatively we could follow closely with good close follow-up and if there is another respiratory exacerbation definitely before spring or summer 2024 then that should be low threshold definitely start [place] again.  We can even do low-dose Dupixent.  They are okay with this plan and will stay in touch.  We also discussed respiratory vaccines.  I recommended RSV and the COVID mRNA booster.     Ref. Range 10/07/2016 12:29 05/10/2017  06/08/2017 Start nucala 08/08/2017  10/16/2017   11/30/2017  03/01/2018 On dupixemen 05/31/2018  01/14/20  Stopped singulair in July, reduced dupixent doe july 12/14/2020 Using ACT from no won 11/08/2021 OFF DUPIXENT since April 2023 01/28/2022   Nitric Oxide Unknown '180 153 156 162 237 158 43 22 32 25 '$ 31 x  ACQ score through march 2020 and then ACTfrom oct 2021   3.6   2.2  1.2 1.8 0 0 '24  24 24    '$ Asthma Control Test ACT Total Score  01/28/2022  3:23 PM 24  11/08/2021  1:53 PM 24  07/21/2021 11:34 AM 25     Lab Results  Component Value Date   NITRICOXIDE 31 11/08/2021       PFT     Latest Ref Rng & Units 11/29/2016   10:07 AM  PFT Results  FVC-Pre L 2.62   FVC-Predicted Pre % 90   FVC-Post L 2.50   FVC-Predicted Post % 86   Pre FEV1/FVC % % 79   Post FEV1/FCV % % 80   FEV1-Pre L 2.08   FEV1-Predicted Pre % 95   FEV1-Post L 2.00   DLCO uncorrected ml/min/mmHg 20.03   DLCO UNC% % 87   DLCO corrected ml/min/mmHg 19.74   DLCO COR %Predicted % 86   DLVA Predicted % 91   TLC L 5.05   TLC % Predicted % 103   RV % Predicted % 111        has a past medical history of Anemia, Anginal pain (HCC), Arthritis, Complication of anesthesia, Coronary artery disease, Dry eyes, Esophageal spasm, GERD (gastroesophageal reflux disease), and MS (multiple sclerosis) ([place]).   reports that she has never smoked. She has never used smokeless tobacco.  Past Surgical History:  Procedure Laterality  Date   ABDOMINAL HYSTERECTOMY     BLEPHAROPLASTY Bilateral    CARDIAC CATHETERIZATION  10/04/06   MINOR CAD,SINGLE VESSEL INVOLVING THE CIRCUMFLEX. 20 TO 30% PROXIMALLY AND 10 TO 20% IN THE MIDDLE SEGMENT.MILD MUSCLE BRIDGING, MID LAD.NORMAL RCA.NORMAL LV FUNCTION.NORMAL MITRAL AND AORTIC VALVE.NORMAL APPEARING AORTA,THORACIC AND ABDOMINAL.NORMAL RENAL ARTERIES.   CARDIOLOGY NUCLEAR MED STUDY  06/22/12   NL LV FUNCTION,EF 68%,NL WALL MOTION.   CAROTID DUPLEX  07/02/11   IRJ:JOAC SOFT PLAQUE NOTED DISTAL CCA AND ORGIN AND PROXIMAL ICA,LEFT>RIGHT.NO ICA STENOSIS. VERTEBRAL ARTERY FLOW IS ANTEGRADE.   CESAREAN SECTION     x2    COLONOSCOPY WITH PROPOFOL N/A 12/11/2014   Procedure: COLONOSCOPY WITH PROPOFOL;  Surgeon: Ronald Lobo, MD;  Location: [facility];  Service: Endoscopy;  Laterality: N/A;   KNEE ARTHROSCOPY Left    scope   PARATHYROIDECTOMY     partial-many years ago   thumb surgery Bilateral    built up and bone removal   TONSILLECTOMY     TRANSTHORACIC ECHOCARDIOGRAM  07/02/11   LV CAVITY SIZE IS NORMAL. SYSTOLIC FUNCTION WAS NORMAL.EF=55% TO 60%.INCREASED  RELATIVE CONTRIBUTION OF ATRIAL CONTRACTION TO VENTRICULAR FILLING;MAYBE DUE TO HYPOVOLEMIA. AV=MILD REGURG.    Allergies  Allergen Reactions   Crestor [Rosuvastatin] Other (See Comments)    Joint pain    Iodinated Contrast Media Other (See Comments)    Sneezing and itchy throat; dye was Isovue 300   Vicodin [Hydrocodone-Acetaminophen]     Made pass-out one time and is okay taking now   Isovue [Iopamidol]     Pt had sneezing and itching of her throat and soft palate.  Dr Alvester Chou checked pt.  She will need premeds in the future.  J Bohm    Immunization History  Administered Date(s) Administered   Fluad Quad(high Dose 65+) 11/27/2018, 12/19/2019, 12/14/2020, 12/14/2021   Influenza, High Dose Seasonal PF 12/22/2013, 12/14/2015, 12/14/2016, 11/30/2017   Influenza,inj,Quad PF,6+ Mos 11/27/2014   PFIZER(Purple Top)SARS-COV-2 Vaccination 04/16/2019, 05/06/2019, 08/06/2020   Pneumococcal Polysaccharide-23 10/26/2013   Pneumococcal-Unspecified 10/26/2013   Tdap 06/14/2011   Zoster Recombinat (Shingrix) 11/13/2017, 02/08/2018    Family History  Problem Relation Age of Onset   Heart disease Mother    Asthma Father    Bladder Cancer Father    Heart failure Father    Hyperlipidemia Brother      Current Outpatient Medications:    albuterol (VENTOLIN HFA) 108 (90 Base) MCG/ACT inhaler, Inhale 2 puffs into the lungs every 6 (six) hours as needed for wheezing or shortness of breath., Disp: 8 g, Rfl: 2   baclofen (LIORESAL) 10 MG tablet, Take 1/2 to 1 pill every night and 1 pill po qd prn spasticity, Disp: 60 each, Rfl: 11   budesonide-formoterol (SYMBICORT) 80-4.5 MCG/ACT inhaler, Inhale 2 puffs into the lungs in the morning and at bedtime., Disp: 10.2 g, Rfl: 12   Cholecalciferol (VITAMIN D PO), Take 1 tablet by mouth daily., Disp: , Rfl:    denosumab (PROLIA) 60 MG/ML SOSY injection, 60 mg, Disp: , Rfl:    diltiazem (CARDIZEM CD) 240 MG 24 hr capsule, TAKE ONE CAPSULE BY MOUTH DAILY, Disp:  90 capsule, Rfl: 1   EPINEPHRINE 0.3 mg/0.3 mL IJ SOAJ injection, USE AS DIRECTED, Disp: 2 each, Rfl: 3   Evolocumab (REPATHA) 140 MG/ML SOSY, Inject into the skin., Disp: , Rfl:    ezetimibe (ZETIA) 10 MG tablet, 1 tablet, Disp: , Rfl:    gabapentin (NEURONTIN) 300 MG capsule, Take 300 mg by mouth daily  as needed., Disp: , Rfl:    ipratropium-albuterol (DUONEB) 0.5-2.5 (3) MG/3ML SOLN, Take 3 mLs by nebulization 2 (two) times daily. (Patient taking differently: Take 3 mLs by nebulization 2 (two) times daily. Pt takes as needed), Disp: 360 mL, Rfl: 5   isosorbide mononitrate (IMDUR) 60 MG 24 hr tablet, TAKE 1&1/2 TABLETS ONCE DAILY., Disp: 135 tablet, Rfl: 3   levofloxacin (LEVAQUIN) 500 MG tablet, Take 1 tablet (500 mg total) by mouth daily., Disp: 7 tablet, Rfl: 0   losartan (COZAAR) 50 MG tablet, Take 1 and a half tablets once daily., Disp: 45 tablet, Rfl: 2   meclizine (ANTIVERT) 25 MG tablet, Take 1 tablet (25 mg total) by mouth 3 (three) times daily as needed for dizziness., Disp: 30 tablet, Rfl: 0   nitroGLYCERIN (NITROSTAT) 0.4 MG SL tablet, PLACE 1 TABLET UNDER THE TONGUE AS NEEDED FOR CHEST PAIN, MAY REPEAT EVERY 5 MINUTES., Disp: 25 tablet, Rfl: 3   pantoprazole (PROTONIX) 20 MG tablet, TAKE ONE TABLET BY MOUTH TWICE DAILY, Disp: 60 tablet, Rfl: 0   PARoxetine (PAXIL) 10 MG tablet, Take 5 mg by mouth daily. , Disp: , Rfl:    Respiratory Therapy Supplies (FLUTTER) DEVI, Use as directed, Disp: 1 each, Rfl: 0      Objective:   Vitals:   01/28/22 1519  BP: 114/64  Pulse: 71  Temp: 97.9 F (36.6 C)  TempSrc: Oral  SpO2: 100%  Weight: 145 lb (65.8 kg)  Height: '5\' 2"'$  (1.575 m)    Estimated body mass index is 26.52 kg/m as calculated from the following:   Height as of this encounter: '5\' 2"'$  (1.575 m).   Weight as of this encounter: 145 lb (65.8 kg).  '@WEIGHTCHANGE'$ @  Filed Weights   01/28/22 1519  Weight: 145 lb (65.8 kg)     Physical Exam   General: No distress. Looks  well Neuro: Alert and Oriented x 3. GCS 15. Speech normal Psych: Pleasant Resp:  Barrel Chest - no.  Wheeze - no, Crackles - no, No overt respiratory distress CVS: Normal heart sounds. Murmurs - no Ext: Stigmata of Connective Tissue Disease - no HEENT: Normal upper airway. PEERL +. No post nasal drip        Assessment:       ICD-10-CM   1. Moderate persistent asthma, unspecified whether complicated  F57.32     2. Eosinophilic asthma  K02.54          Plan:     Patient Instructions     ICD-10-CM   1. Moderate persistent asthma, unspecified whether complicated  Y70.62   2. Eosinophilic asthma  B76.28      asthma is been under excellent control symbicort  and being off dupixent x since April 2023 except 1 episode of flare/pneumonia In aug 2023  CXR 9/19/223 - no pneumonia  Plan - HAPPY BIRTHDAY (belated) -continue symbicort as before -Use albuterol as needed - recommend RSV and covid vaccine when released - ok to monitor without dupixent at this moment but if another flare up by summer/spring 2024 then restart dupixent  Follow-up -6 months do exhaled nitric oxide testing  = Return to see Dr [name] in 6 months or sooner if needed  - ACT/FEno at follow-up    SIGNATURE    Dr. Brand Males, M.D., F.C.C.P,  Pulmonary and Critical Care Medicine Staff Physician, [place] Director - Interstitial Lung Disease  Program  Pulmonary [place] [facility] [place], [place],  [place]  Pager: [phone], If no answer or between  15:00h - 7:00h: call [phone] Telephone: [phone]  4:00 PM 01/28/2022

## 2022-01-28 NOTE — Patient Instructions (Addendum)
ICD-10-CM   1. Moderate persistent asthma, unspecified whether complicated  N01.41   2. Eosinophilic asthma  P97.33      asthma is been under excellent control symbicort  and being off dupixent x since April 2023 except 1 episode of flare/pneumonia In aug 2023  CXR 9/19/223 - no pneumonia  Plan - HAPPY BIRTHDAY (belated) -continue symbicort as before -Use albuterol as needed - recommend RSV and covid vaccine when released - ok to monitor without dupixent at this moment but if another flare up by summer/spring 2024 then restart dupixent  Follow-up -6 months do exhaled nitric oxide testing  = Return to see Dr [name] in 6 months or sooner if needed  - ACT/FEno at follow-up

## 2022-02-22 ENCOUNTER — Other Ambulatory Visit: Payer: Self-pay | Admitting: Cardiovascular Disease

## 2022-02-23 DIAGNOSIS — L820 Inflamed seborrheic keratosis: Secondary | ICD-10-CM | POA: Diagnosis not present

## 2022-02-23 DIAGNOSIS — L570 Actinic keratosis: Secondary | ICD-10-CM | POA: Diagnosis not present

## 2022-03-01 ENCOUNTER — Other Ambulatory Visit: Payer: Self-pay | Admitting: Adult Health

## 2022-03-02 DIAGNOSIS — L820 Inflamed seborrheic keratosis: Secondary | ICD-10-CM | POA: Diagnosis not present

## 2022-03-02 DIAGNOSIS — L718 Other rosacea: Secondary | ICD-10-CM | POA: Diagnosis not present

## 2022-03-17 ENCOUNTER — Encounter: Payer: Self-pay | Admitting: Neurology

## 2022-03-17 ENCOUNTER — Other Ambulatory Visit: Payer: Self-pay | Admitting: Internal Medicine

## 2022-03-17 ENCOUNTER — Ambulatory Visit: Payer: Medicare PPO | Admitting: Neurology

## 2022-03-17 VITALS — BP 122/66 | HR 76 | Ht 63.0 in | Wt 143.0 lb

## 2022-03-17 DIAGNOSIS — G35 Multiple sclerosis: Secondary | ICD-10-CM

## 2022-03-17 DIAGNOSIS — R208 Other disturbances of skin sensation: Secondary | ICD-10-CM | POA: Diagnosis not present

## 2022-03-17 DIAGNOSIS — R3915 Urgency of urination: Secondary | ICD-10-CM | POA: Diagnosis not present

## 2022-03-17 DIAGNOSIS — R269 Unspecified abnormalities of gait and mobility: Secondary | ICD-10-CM | POA: Diagnosis not present

## 2022-03-17 NOTE — Progress Notes (Signed)
[facility]  PATIENT: [name] DOB: [birth year]  REFERRING DOCTOR OR PCP: Holland Commons FNP SOURCE: Patient, notes from [facility] neurology (Dr. [name]), imaging and lab reports, MRI images personally reviewed.  _________________________________   HISTORICAL  CHIEF COMPLAINT:  Chief Complaint  Patient presents with   Follow-up    Pt in room #11 and alone. Pt here today for f/u on her MS.    HISTORY OF PRESENT ILLNESS:  [name] is a 76 year old woman who was diagnosed with MS  UPDATE 03/17/2022 She denies new neurologic symptoms.  She has a newer pain in the legs, L>R, with 5-10 minutes of a cramp like pain.      She is walking fairly well.   She does not completely pick up the left foot and may trip.    Sometimes the left leg buckles but no falls.   She is working with a Clinical research associate.     She has urinary urgency and some incontinence, usually leakage.  She has trouble readig even with reading glasses but distance vision is off.   Her left eye is worse than her right.    The intermittent squeezing sensations in her upper body have improved with baclofen. .    She had previously been on gabapentin and carbamazepine for the dysesthesias without much benefit.     She was on Dupixent for her lungs but is it eing held to see if still needed.      She has some neck pain and reduced ROM in neck.  This is better today than earlier in the week.     She reports fatigue.   She is not sleeping well at night due to leg pain.  She averages 6 hours.    Some is more dysesthetic and some is more aching.    She sometimes feels she has to move her legs to be comfortable.  She denies depression.    Cognition is usually fine.   She occasioanlly has trouble finding words.     She is on diclofenac for hr hip pain.     This was left > right in past but now is right > left.     MS history: Ms. Hamme was diagnosed with MS in 2000 after presenting with sensation of not  feeling right.   She had an MRI done showing white matter lesions.   She also had some foot weakness at  the time.   She also had an LP that was reportedly negative.   She was placed initially in a drug study (not sure which one but it was a sq injectable one).   She then was placed on Copaxone for several years and then went off of all DMTs.     She began to experience a squeezing sensation intermittently lasting 15 minues at a time a few times a day for several days    She was started on Ocrevus in 2017 due to these sensory symptoms.      She reports stopping in late 2018 or 2019 due to two pneumonias.    She has been off any DMT since.    Imaging: MRI of the brain 12/12/2018 shows stable confluent T2/FLAIR hyperintense foci predominantly in the subcortical and deep white matter.  There were no foci in the infratentorial white matter.  A couple small foci are noted in the thalamus.  None of the foci enhance or appear to be acute.  The scan was unchanged compared  to the 08/18/2017 MRI.  MRI of the cervical spine 04/06/2016 shows a normal spinal cord.  There are degenerative changes from C3-C4 through C6-C7 Causing mild spinal stenosis.  At C3-C4, there is left greater than right foraminal narrowing with potential for left C4 nerve root compression.  At C4-C5, there is right greater than left foraminal narrowing with potential for compression of the right C5 nerve roots.  At C5-C6, there is right greater than left foraminal narrowing with potential for right C6 nerve root compression.  At C6-C7, there is bilateral foraminal narrowing though there did not appear to be nerve root compression.  REVIEW OF SYSTEMS: Constitutional: No fevers, chills, sweats, or change in appetite Eyes: No visual changes, double vision, eye pain Ear, nose and throat: No hearing loss, ear pain, nasal congestion, sore throat Cardiovascular: No chest pain, palpitations Respiratory:  No shortness of breath at rest or with exertion.    No wheezes GastrointestinaI: No nausea, vomiting, diarrhea, abdominal pain, fecal incontinence Genitourinary:  No dysuria, urinary retention or frequency.  No nocturia. Musculoskeletal:  No neck pain, back pain Integumentary: No rash, pruritus, skin lesions Neurological: as above Psychiatric: No depression at this time.  No anxiety Endocrine: No palpitations, diaphoresis, change in appetite, change in weigh or increased thirst Hematologic/Lymphatic:  No anemia, purpura, petechiae. Allergic/Immunologic: No itchy/runny eyes, nasal congestion, recent allergic reactions, rashes  ALLERGIES: Allergies  Allergen Reactions   Crestor [Rosuvastatin] Other (See Comments)    Joint pain    Iodinated Contrast Media Other (See Comments)    Sneezing and itchy throat; dye was Isovue 300   Vicodin [Hydrocodone-Acetaminophen]     Made pass-out one time and is okay taking now   Isovue [Iopamidol]     Pt had sneezing and itching of her throat and soft palate.  Dr Alvester Chou checked pt.  She will need premeds in the future.  J Bohm    HOME MEDICATIONS:  Current Outpatient Medications:    albuterol (VENTOLIN HFA) 108 (90 Base) MCG/ACT inhaler, Inhale 2 puffs into the lungs every 6 (six) hours as needed for wheezing or shortness of breath., Disp: 8 g, Rfl: 2   baclofen (LIORESAL) 10 MG tablet, Take 1/2 to 1 pill every night and 1 pill po qd prn spasticity, Disp: 60 each, Rfl: 11   Cholecalciferol (VITAMIN D PO), Take 1 tablet by mouth daily., Disp: , Rfl:    denosumab (PROLIA) 60 MG/ML SOSY injection, 60 mg, Disp: , Rfl:    diltiazem (CARDIZEM CD) 240 MG 24 hr capsule, TAKE ONE CAPSULE BY MOUTH DAILY, Disp: 90 capsule, Rfl: 1   EPINEPHRINE 0.3 mg/0.3 mL IJ SOAJ injection, USE AS DIRECTED, Disp: 2 each, Rfl: 3   Evolocumab (REPATHA) 140 MG/ML SOSY, Inject into the skin., Disp: , Rfl:    ezetimibe (ZETIA) 10 MG tablet, 1 tablet, Disp: , Rfl:    gabapentin (NEURONTIN) 300 MG capsule, Take 300 mg by mouth daily  as needed., Disp: , Rfl:    ipratropium-albuterol (DUONEB) 0.5-2.5 (3) MG/3ML SOLN, Take 3 mLs by nebulization 2 (two) times daily. (Patient taking differently: Take 3 mLs by nebulization 2 (two) times daily. Pt takes as needed), Disp: 360 mL, Rfl: 5   isosorbide mononitrate (IMDUR) 60 MG 24 hr tablet, TAKE 1 & 1/2 TABLETS ONCE DAILY, Disp: 135 tablet, Rfl: 0   levofloxacin (LEVAQUIN) 500 MG tablet, Take 1 tablet (500 mg total) by mouth daily., Disp: 7 tablet, Rfl: 0   losartan (COZAAR) 50 MG tablet, Take 1  and a half tablets once daily., Disp: 45 tablet, Rfl: 2   meclizine (ANTIVERT) 25 MG tablet, Take 1 tablet (25 mg total) by mouth 3 (three) times daily as needed for dizziness., Disp: 30 tablet, Rfl: 0   nitroGLYCERIN (NITROSTAT) 0.4 MG SL tablet, PLACE 1 TABLET UNDER THE TONGUE AS NEEDED FOR CHEST PAIN, MAY REPEAT EVERY 5 MINUTES., Disp: 25 tablet, Rfl: 3   pantoprazole (PROTONIX) 20 MG tablet, TAKE ONE TABLET BY MOUTH TWICE DAILY, Disp: 60 tablet, Rfl: 0   PARoxetine (PAXIL) 10 MG tablet, Take 5 mg by mouth daily. , Disp: , Rfl:    Respiratory Therapy Supplies (FLUTTER) DEVI, Use as directed, Disp: 1 each, Rfl: 0   SYMBICORT 80-4.5 MCG/ACT inhaler, INHALE TWO PUFFS INTO THE LUNGS EVERY MORNING AND AT BEDTIME, Disp: 10.2 g, Rfl: 12  PAST MEDICAL HISTORY: Past Medical History:  Diagnosis Date   Anemia    past history-many yrs ago   Anginal pain ([place])    being evaluated by Dr. Tyrone Sage, arm pain,"bad indigestion" -no heart related findings as of yet   Arthritis    hip. back pain   Complication of anesthesia    s/p Hysterectomy "vagal response "heart stopped" -did not require shocking.   Coronary artery disease    Dry eyes    Esophageal spasm    GERD (gastroesophageal reflux disease)    MS (multiple sclerosis) ([place])    stable-sees [name] every 6 months    PAST SURGICAL HISTORY: Past Surgical History:  Procedure Laterality Date   ABDOMINAL HYSTERECTOMY     BLEPHAROPLASTY  Bilateral    CARDIAC CATHETERIZATION  10/04/06   MINOR CAD,SINGLE VESSEL INVOLVING THE CIRCUMFLEX. 20 TO 30% PROXIMALLY AND 10 TO 20% IN THE MIDDLE SEGMENT.MILD MUSCLE BRIDGING, MID LAD.NORMAL RCA.NORMAL LV FUNCTION.NORMAL MITRAL AND AORTIC VALVE.NORMAL APPEARING AORTA,THORACIC AND ABDOMINAL.NORMAL RENAL ARTERIES.   CARDIOLOGY NUCLEAR MED STUDY  06/22/12   NL LV FUNCTION,EF 68%,NL WALL MOTION.   CAROTID DUPLEX  07/02/11   HAL:PFXT SOFT PLAQUE NOTED DISTAL CCA AND ORGIN AND PROXIMAL ICA,LEFT>RIGHT.NO ICA STENOSIS. VERTEBRAL ARTERY FLOW IS ANTEGRADE.   CESAREAN SECTION     x2    COLONOSCOPY WITH PROPOFOL N/A 12/11/2014   Procedure: COLONOSCOPY WITH PROPOFOL;  Surgeon: Ronald Lobo, MD;  Location: [facility];  Service: Endoscopy;  Laterality: N/A;   KNEE ARTHROSCOPY Left    scope   PARATHYROIDECTOMY     partial-many years ago   thumb surgery Bilateral    built up and bone removal   TONSILLECTOMY     TRANSTHORACIC ECHOCARDIOGRAM  07/02/11   LV CAVITY SIZE IS NORMAL. SYSTOLIC FUNCTION WAS NORMAL.EF=55% TO 60%.INCREASED RELATIVE CONTRIBUTION OF ATRIAL CONTRACTION TO VENTRICULAR FILLING;MAYBE DUE TO HYPOVOLEMIA. AV=MILD REGURG.    FAMILY HISTORY: Family History  Problem Relation Age of Onset   Heart disease Mother    Asthma Father    Bladder Cancer Father    Heart failure Father    Hyperlipidemia Brother     SOCIAL HISTORY:  Social History   Socioeconomic History   Marital status: Divorced    Spouse name: Not on file   Number of children: 3   Years of education: Not on file   Highest education level: Master's degree (e.g., MA, MS, MEng, MEd, MSW, MBA)  Occupational History   Occupation: retired  Tobacco Use   Smoking status: Never   Smokeless tobacco: Never  Vaping Use   Vaping Use: Never used  Substance and Sexual Activity   Alcohol use: Yes  Alcohol/week: 0.0 standard drinks of alcohol    Comment: wine occ.   Drug use: No   Sexual activity: Not on file  Other Topics  Concern   Not on file  Social History Narrative   Lives alone and new puppy   R handed   Caffeine: 16oz a day   Social Determinants of Health   Financial Resource Strain: Not on file  Food Insecurity: Not on file  Transportation Needs: Not on file  Physical Activity: Not on file  Stress: Not on file  Social Connections: Not on file  Intimate Partner Violence: Not on file     PHYSICAL EXAM  Vitals:   03/17/22 1506  BP: 122/66  Pulse: 76  Weight: 143 lb (64.9 kg)  Height: '5\' 3"'$  (1.6 m)     Body mass index is 25.33 kg/m.   General: The patient is well-developed and well-nourished and in no acute distress  HEENT:  Head is [place]/AT.  Sclera are anicteric.     Skin: Extremities are without rash or  edema.  Neurologic Exam  Mental status: The patient is alert and oriented x 3 at the time of the examination. The patient has apparent normal recent and remote memory, with an apparently normal attention span and concentration ability.   Speech is normal.  Cranial nerves: Extraocular movements are full. Facial strength is normal. . No obvious hearing deficits are noted.  Motor:  Muscle bulk is normal.   Tone is normal. Strength is  5 / 5 in all 4 extremities.   Sensory: Sensory testing is intact to pinprick, soft touch and vibration sensation in all 4 extremities.  Coordination: Cerebellar testing reveals good finger-nose-finger and heel-to-shin bilaterally.  Gait and station: Station is normal.   Gait is arthritic.  The tandem gait is wide.  Romberg is negative.   Reflexes: Deep tendon reflexes are symmetric and normal in the arms and increased at the knees.  Ankle reflexes are normal..   Plantar responses are flexor.    DIAGNOSTIC DATA (LABS, IMAGING, TESTING) - I reviewed patient records, labs, notes, testing and imaging myself where available.  Lab Results  Component Value Date   WBC 4.8 03/26/2021   HGB 13.4 03/26/2021   HCT 41.2 03/26/2021   MCV 91.2  03/26/2021   PLT 186.0 03/26/2021      Component Value Date/Time   NA 142 03/26/2021 0905   K 3.7 03/26/2021 0905   CL 107 03/26/2021 0905   CO2 28 03/26/2021 0905   GLUCOSE 77 03/26/2021 0905   BUN 11 03/26/2021 0905   CREATININE 0.77 03/26/2021 0905   CALCIUM 8.8 03/26/2021 0905   PROT 6.3 03/26/2021 0905   ALBUMIN 4.1 03/26/2021 0905   AST 26 03/26/2021 0905   ALT 48 (H) 03/26/2021 0905   ALKPHOS 57 03/26/2021 0905   BILITOT 0.3 03/26/2021 0905   GFRNONAA >60 08/03/2020 1603   GFRAA >60 03/25/2018 1701   Lab Results  Component Value Date   TSH 0.735 07/02/2011       ASSESSMENT AND PLAN  Multiple sclerosis ([place])  Dysesthesia  Gait disturbance  Urgency of urination   She will remain off a DMT for MS.  Continue baclofen. Consider an anticholinergic if bladder issues worsen. RTC 1 year  Keath Matera A. Felecia Shelling, MD, [facility] 50/93/2671, 2:45 PM Certified in Neurology, Clinical Neurophysiology, Sleep Medicine and Neuroimaging  [facility] [address], [place],  [address] [phone]

## 2022-03-24 ENCOUNTER — Other Ambulatory Visit: Payer: Self-pay | Admitting: Pharmacy Technician

## 2022-04-27 ENCOUNTER — Ambulatory Visit: Payer: Medicare PPO | Attending: Cardiovascular Disease | Admitting: Cardiovascular Disease

## 2022-04-27 ENCOUNTER — Encounter: Payer: Self-pay | Admitting: Cardiovascular Disease

## 2022-04-27 VITALS — BP 125/73 | HR 58 | Ht 62.5 in | Wt 144.8 lb

## 2022-04-27 DIAGNOSIS — I2510 Atherosclerotic heart disease of native coronary artery without angina pectoris: Secondary | ICD-10-CM | POA: Diagnosis not present

## 2022-04-27 DIAGNOSIS — I350 Nonrheumatic aortic (valve) stenosis: Secondary | ICD-10-CM

## 2022-04-27 DIAGNOSIS — I10 Essential (primary) hypertension: Secondary | ICD-10-CM | POA: Diagnosis not present

## 2022-04-27 DIAGNOSIS — R002 Palpitations: Secondary | ICD-10-CM

## 2022-04-27 DIAGNOSIS — J45909 Unspecified asthma, uncomplicated: Secondary | ICD-10-CM

## 2022-04-27 DIAGNOSIS — G478 Other sleep disorders: Secondary | ICD-10-CM | POA: Diagnosis not present

## 2022-04-27 NOTE — Progress Notes (Signed)
Cardiology Office Note    Date:  04/28/2022   ID:  Caedence, Walborn [birth year], MRN [id number]  PCP:  Merri Brunette, MD  Cardiologist:  Nicki Guadalajara, MD    History of Present Illness:  EDENA CARANDANG is a 77 y.o. female who presents for a 40-month follow-up evaluation.  Ms. Nelton has documented mild CAD by  cardiac catheterization in July 2008 by Dr. Charolette Child.  She had mild narrowing of 20-30% in the proximal and 10-20% in the mid left circumflex vessel.  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  She has been on medical therapy.  She also has a history of hypertension,.  Her last stress test was done in March 2014 were she had nonspecific T changes and nondiagnostic 0.5-1 mm inferolateral ST segment changes with stress.  Scintigraphic images revealed normal perfusion and function.   She has a history of multiple sclerosis and is followed by Dr. Harriette Bouillon in [place].  She has a history of hyperlipidemia for which she takes Vytorin 10/20 and GERD for which he takes over-the-counter Prilosec.   She has experienced recent episodes of chest pain which have been occurring almost weekly. She experiences squeezing in her arms and jaw.  She denies chest pressure.  The symptoms are not associated with activity and typically resolve on her own.  She has taken nitroglycerin with questionable benefit.  She also notes calf discomfort at night while sleeping.  She denies restless legs.   She was started on a human monoclonal antibody ocrelizumab  for her multiple sclerosis and admits to improvement in symptomatology.  When I saw her  she had experienced recurrent episodes of chest pain with some atypical features.  She underwent a nuclear perfusion study in December 2016 which revealed normal perfusion with an ejection fraction of 66%.  She developed recurrent chest pain in January and again was felt to be stable cardiovascularly.  She subsequently was evaluated at  [facility] and was felt that her chest discomfort was due to esophageal lower esophageal sphincter spasm and inability to relax appropriately.  Her symptoms have improved with Protonix and she is now being weaned off Protonix and has been started on Zantac.   She has been evaluated on several occasions by Azalee Course, [facility] with his most recent evaluation in March 2019.  He had seen her in December 2018 with chest pain more consistent with esophageal spasm.  An echocardiogram in December 2018 showed an EF of 60 to 65% with grade 1 diastolic dysfunction, mild aortic stenosis with trivial AR.  She has had issues with asthma.  She also had experienced some palpitations and a 24-hour monitor revealed short bursts of SVT, PACs and PVCs. Palpitations have improved though she still experiences some palpitations at night while falling asleep.  She eats chocolate on a daily basis.  Her sleep is very poor.  She has frequent awakenings. She snores.  She has nocturia at least 3 times per night.  He denies any chest pain.    I saw her in September 2019 with her palpitations I recommended discontinuance of amlodipine and instituted Cardizem CD 240 mg.  We discussed avoidance of chocolate which contains caffeine.  Due to concerns for sleep apnea I also recommended she undergo a sleep evaluation.  She has also seen pulmonary since her last evaluation with complaints of wheezing and on March 25, 2018 was evaluated in the emergency room with  dizziness.  She had fallen over a dog gate and struck her head.  A head CT did not reveal any acute intracranial abnormality.  There was mild age-appropriate cortical atrophy with moderate to severe chronic microvascular ischemic changes of the white matter.  She is scheduled to undergo a sleep study on January 27.  She continues to experience occasional palpitations at night.  She recently was started on a prednisone taper due to her wheezing has had some chest congestion.   She underwent a  sleep study on April 23, 2018.She was not found to have significant sleep apnea and her overall AHI was 1.1/h.  However,there was mildly sleep apnea during REM sleep with an AHI of 7.0/h.  There was evidence for soft snoring and her oxygen nadir was 88%.   I evaluated her in a telemedicine visit on Aug 01, 2018.  At that time, she was no longer  taking her multiple sclerosis infusion, due to exacerbation of asthmatic inflammation.  She is now on dupixant and is followed by Dr. Marchelle Gearing.  She does have issues at times where she feels like she just cannot get it very deep breath.  She denies chest pain.  She denies palpitations.  She continues to see her doctor in [place] for her multiple sclerosis.  Since her last evaluation with me, she has been evaluated by Azalee Course, PA in July 2021.  She had undergone a repeat Myoview study on March 28, 2019 which showed an EF of 66% and no evidence for prior infarct or ischemia and was interpreted as low risk.  When I last saw her she was experiencing some atypical chest discomfort occurring approximately 2-3 times per month.  He had recommended titration of isosorbide to 90 mg to help with potential esophageal spasm.  She had a pulmonary evaluation with Ames Dura, NP on January 14, 2020.  Her asthma was felt to be well controlled..  It was recommended that she stop Zyrtec in addition to montelukast and Singulair and reduce to present to 200 mg every 2 weeks.  Follow-up in 3 months was recommended to do an exhaled nitric oxide testing.  I saw her in November 2021 at which time she denied any chest pain worrisome for angina.    At times she notes a sharp twinge of discomfort.  She apparently is no longer taking amlodipine and is on diltiazem 240 mg daily, losartan 50 mg daily for hypertension and continues to be on isosorbide mononitrate 90 mg.  She is on Vytorin 10/20 for hyperlipidemia.  She was on a Dupixent injection every 14 days.  She continued to be  on Protonix 20 mg twice a day.    I last saw her on September 03 2020. She had labs yesterday by Dr. Harriette Bouillon in [place].  She has had issues with recent bursitis.  She has remained active and goes to the gym 2 times per week and rides a bicycle.  She tells me she had developed red spots and there was concern for possible [facility] spotted fever for which she was treated with antibiotics but her test was negative.  She has not had any anginal symptoms.    Since I last saw her, she has continued to feel well.  She goes to the gym at least 2-3 times per week and denies chest pain or shortness of breath.  She does drink moderate amount of caffeine with tea and eats chocolates.  She admits to rare palpitations particularly during periods  of increased stress.  She continues to see Dr. Renne Crigler for primary care who checks laboratory.  She presents for a follow-up evaluation.   Past Medical History:  Diagnosis Date   Anemia    past history-many yrs ago   Anginal pain (HCC)    being evaluated by Dr. Joella Prince, arm pain,"bad indigestion" -no heart related findings as of yet   Arthritis    hip. back pain   Complication of anesthesia    s/p Hysterectomy "vagal response "heart stopped" -did not require shocking.   Coronary artery disease    Dry eyes    Esophageal spasm    GERD (gastroesophageal reflux disease)    MS (multiple sclerosis) (HCC)    stable-sees Jeffrey every 6 months    Past Surgical History:  Procedure Laterality Date   ABDOMINAL HYSTERECTOMY     BLEPHAROPLASTY Bilateral    CARDIAC CATHETERIZATION  10/04/06   MINOR CAD,SINGLE VESSEL INVOLVING THE CIRCUMFLEX. 20 TO 30% PROXIMALLY AND 10 TO 20% IN THE MIDDLE SEGMENT.MILD MUSCLE BRIDGING, MID LAD.NORMAL RCA.NORMAL LV FUNCTION.NORMAL MITRAL AND AORTIC VALVE.NORMAL APPEARING AORTA,THORACIC AND ABDOMINAL.NORMAL RENAL ARTERIES.   CARDIOLOGY NUCLEAR MED STUDY  06/22/12   NL LV FUNCTION,EF 68%,NL WALL MOTION.   CAROTID DUPLEX  07/02/11    QQV:ZDGL SOFT PLAQUE NOTED DISTAL CCA AND ORGIN AND PROXIMAL ICA,LEFT>RIGHT.NO ICA STENOSIS. VERTEBRAL ARTERY FLOW IS ANTEGRADE.   CESAREAN SECTION     x2    COLONOSCOPY WITH PROPOFOL N/A 12/11/2014   Procedure: COLONOSCOPY WITH PROPOFOL;  Surgeon: Bernette Redbird, MD;  Location: [facility];  Service: Endoscopy;  Laterality: N/A;   KNEE ARTHROSCOPY Left    scope   PARATHYROIDECTOMY     partial-many years ago   thumb surgery Bilateral    built up and bone removal   TONSILLECTOMY     TRANSTHORACIC ECHOCARDIOGRAM  07/02/11   LV CAVITY SIZE IS NORMAL. SYSTOLIC FUNCTION WAS NORMAL.EF=55% TO 60%.INCREASED RELATIVE CONTRIBUTION OF ATRIAL CONTRACTION TO VENTRICULAR FILLING;MAYBE DUE TO HYPOVOLEMIA. AV=MILD REGURG.    Current Medications: Outpatient Medications Prior to Visit  Medication Sig Dispense Refill   albuterol (VENTOLIN HFA) 108 (90 Base) MCG/ACT inhaler Inhale 2 puffs into the lungs every 6 (six) hours as needed for wheezing or shortness of breath. 8 g 2   baclofen (LIORESAL) 10 MG tablet Take 1/2 to 1 pill every night and 1 pill po qd prn spasticity 60 each 11   Cholecalciferol (VITAMIN D PO) Take 1 tablet by mouth daily.     denosumab (PROLIA) 60 MG/ML SOSY injection 60 mg     diltiazem (CARDIZEM CD) 240 MG 24 hr capsule TAKE ONE CAPSULE BY MOUTH DAILY 90 capsule 1   EPINEPHRINE 0.3 mg/0.3 mL IJ SOAJ injection USE AS DIRECTED 2 each 3   Evolocumab (REPATHA) 140 MG/ML SOSY Inject into the skin.     ezetimibe (ZETIA) 10 MG tablet 1 tablet     ipratropium-albuterol (DUONEB) 0.5-2.5 (3) MG/3ML SOLN Take 3 mLs by nebulization 2 (two) times daily. (Patient taking differently: Take 3 mLs by nebulization 2 (two) times daily. Pt takes as needed) 360 mL 5   isosorbide mononitrate (IMDUR) 60 MG 24 hr tablet TAKE 1 & 1/2 TABLETS ONCE DAILY 135 tablet 0   levofloxacin (LEVAQUIN) 500 MG tablet Take 1 tablet (500 mg total) by mouth daily. 7 tablet 0   losartan (COZAAR) 50 MG tablet Take 1 and a half  tablets once daily. 45 tablet 2   meclizine (ANTIVERT) 25 MG tablet Take 1 tablet (  25 mg total) by mouth 3 (three) times daily as needed for dizziness. 30 tablet 0   nitroGLYCERIN (NITROSTAT) 0.4 MG SL tablet PLACE 1 TABLET UNDER THE TONGUE AS NEEDED FOR CHEST PAIN, MAY REPEAT EVERY 5 MINUTES. 25 tablet 3   pantoprazole (PROTONIX) 20 MG tablet TAKE ONE TABLET BY MOUTH TWICE DAILY 60 tablet 0   PARoxetine (PAXIL) 10 MG tablet Take 5 mg by mouth daily.      Respiratory Therapy Supplies (FLUTTER) DEVI Use as directed 1 each 0   SYMBICORT 80-4.5 MCG/ACT inhaler INHALE TWO PUFFS INTO THE LUNGS EVERY MORNING AND AT BEDTIME 10.2 g 12   gabapentin (NEURONTIN) 300 MG capsule Take 300 mg by mouth daily as needed.     No facility-administered medications prior to visit.     Allergies:   Crestor [rosuvastatin], Iodinated contrast media, Vicodin [hydrocodone-acetaminophen], and Isovue [iopamidol]   Social History   Socioeconomic History   Marital status: Divorced    Spouse name: Not on file   Number of children: 3   Years of education: Not on file   Highest education level: Master's degree (e.g., MA, MS, MEng, MEd, MSW, MBA)  Occupational History   Occupation: retired  Tobacco Use   Smoking status: Never   Smokeless tobacco: Never  Vaping Use   Vaping Use: Never used  Substance and Sexual Activity   Alcohol use: Yes    Alcohol/week: 0.0 standard drinks of alcohol    Comment: wine occ.   Drug use: No   Sexual activity: Not on file  Other Topics Concern   Not on file  Social History Narrative   Lives alone and new puppy   R handed   Caffeine: 16oz a day   Social Determinants of Health   Financial Resource Strain: Not on file  Food Insecurity: Not on file  Transportation Needs: Not on file  Physical Activity: Not on file  Stress: Not on file  Social Connections: Not on file     Family History:  The patient's family history includes Asthma in her father; Bladder Cancer in her  father; Heart disease in her mother; Heart failure in her father; Hyperlipidemia in her brother.   ROS General: Negative; No fevers, chills, or night sweats;  HEENT: Negative; No changes in vision or hearing, sinus congestion, difficulty swallowing Pulmonary: History of asthma Cardiovascular: Negative; No chest pain, presyncope, syncope, palpitations GI: Negative; No nausea, vomiting, diarrhea, or abdominal pain GU: Negative; No dysuria, hematuria, or difficulty voiding Musculoskeletal: Negative; no myalgias, joint pain, or weakness Hematologic/Oncology: Negative; no easy bruising, bleeding Endocrine: Negative; no heat/cold intolerance; no diabetes Neuro: Negative; no changes in balance, headaches Skin: Negative; No rashes or skin lesions Psychiatric: Negative; No behavioral problems, depression Sleep: Negative; No snoring, daytime sleepiness, hypersomnolence, bruxism, restless legs, hypnogognic hallucinations, no cataplexy Other comprehensive 14 point system review is negative.   PHYSICAL EXAM:   VS:  BP 125/73   Pulse (!) 58   Ht 5' 2.5" (1.588 m)   Wt 144 lb 12.8 oz (65.7 kg)   SpO2 95%   BMI 26.06 kg/m     Repeat blood pressure by me was 132/70  Wt Readings from Last 3 Encounters:  04/27/22 144 lb 12.8 oz (65.7 kg)  03/17/22 143 lb (64.9 kg)  01/28/22 145 lb (65.8 kg)    General: Alert, oriented, no distress.  Skin: normal turgor, no rashes, warm and dry HEENT: Normocephalic, atraumatic. Pupils equal round and reactive to light; sclera anicteric; extraocular muscles  intact; Nose without nasal septal hypertrophy Mouth/Parynx benign; Mallinpatti scale 3 Neck: No JVD, no carotid bruits; normal carotid upstroke Lungs: clear to ausculatation and percussion; no wheezing or rales Chest wall: without tenderness to palpitation Heart: PMI not displaced, RRR, s1 s2 normal, 1/6 systolic murmur, no diastolic murmur, no rubs, gallops, thrills, or heaves Abdomen: soft, nontender; no  hepatosplenomehaly, BS+; abdominal aorta nontender and not dilated by palpation. Back: no CVA tenderness Pulses 2+ Musculoskeletal: full range of motion, normal strength, no joint deformities Extremities: no clubbing cyanosis or edema, Homan's sign negative  Neurologic: grossly nonfocal; Cranial nerves grossly wnl Psychologic: Normal mood and affect    Studies/Labs Reviewed:   April 27, 2022 ECG (independently read by me): Sinus bradycardia at 58, pssible LAE  September 03, 2020 ECG (independently read by me):  NSR at 74; , no ectopy, normal intervals    January 30, 2020 ECG (independently read by me): Sinus bradycardia at 59, LAE, no ectopy  Recent Labs:    Latest Ref Rng & Units 03/26/2021    9:05 AM 08/03/2020    4:03 PM 05/12/2020   12:37 PM  BMP  Glucose 70 - 99 mg/dL 77  981  191   BUN 6 - 23 mg/dL 11  14  16    Creatinine 0.40 - 1.20 mg/dL 4.78  2.95  6.21   Sodium 135 - 145 mEq/L 142  141  137   Potassium 3.5 - 5.1 mEq/L 3.7  3.9  3.9   Chloride 96 - 112 mEq/L 107  108  107   CO2 19 - 32 mEq/L 28  26  23    Calcium 8.4 - 10.5 mg/dL 8.8  9.8  9.0         Latest Ref Rng & Units 03/26/2021    9:05 AM 08/03/2020    4:03 PM 05/12/2020   12:37 PM  Hepatic Function  Total Protein 6.0 - 8.3 g/dL 6.3  7.1  6.6    6.6   Albumin 3.5 - 5.2 g/dL 4.1  4.4  4.0    4.0   AST 0 - 37 U/L 26  19  13    13    ALT 0 - 35 U/L 48  27  26    26    Alk Phosphatase 39 - 117 U/L 57  46  46    46   Total Bilirubin 0.2 - 1.2 mg/dL 0.3  0.4  0.4    0.4   Bilirubin, Direct 0.0 - 0.3 mg/dL 0.1   0.0        Latest Ref Rng & Units 03/26/2021    9:05 AM 08/03/2020    4:03 PM 05/12/2020   12:37 PM  CBC  WBC 4.0 - 10.5 K/uL 4.8  7.3  6.3   Hemoglobin 12.0 - 15.0 g/dL 30.8  65.7  84.6   Hematocrit 36.0 - 46.0 % 41.2  42.3  40.2   Platelets 150.0 - 400.0 K/uL 186.0  245  195.0    Lab Results  Component Value Date   MCV 91.2 03/26/2021   MCV 96.8 08/03/2020   MCV 94.4 05/12/2020   Lab  Results  Component Value Date   TSH 0.735 07/02/2011   No results found for: "HGBA1C"   BNP No results found for: "BNP"  ProBNP No results found for: "PROBNP"   Lipid Panel  No results found for: "CHOL", "TRIG", "HDL", "CHOLHDL", "VLDL", "LDLCALC", "LDLDIRECT", "LABVLDL"   RADIOLOGY: No results found.   Additional studies/ records  that were reviewed today include:  I reviewed the patient's record since her last evaluation with me in May 2020.  ASSESSMENT:    1. Coronary artery disease involving native coronary artery of native heart without angina pectoris   2. Essential hypertension   3. Palpitations   4. UARS (upper airway resistance syndrome)   5. Mild aortic stenosis   6. Asthma, unspecified asthma severity, unspecified whether complicated, unspecified whether persistent     PLAN:  Ms. Myka Ruiter is a 77 year old female with previous documentation of mild CAD as well as muscle bridging.  She has been documented as normal systolic function with grade 1 diastolic dysfunction and mild aortic stenosis.  She has multiple sclerosis as well as asthma.  In the past she had experienced some atypical chest pain and has undergone Myoview studies with the most recent in December 2020 remaining low risk.  Her blood pressure today is controlled on her medical regimen of diltiazem 240 mg daily, losartan 50 mg, and she is on isosorbide 90 mg which was started and further increased due to concerns for esophageal spasm which has stabilized.  Remotely, she has had palpitations with documentation of short bursts of SVT, PACs and PVCs.  This improved with caffeine reduction.  Presently she notes rare palpitations which may be stress mediated.  I discussed reduction in caffeine intake.  She enjoys her chocolates.  She continues to be on Zetia in addition to Repatha for hyperlipidemia.  She sees Dr. Renne Crigler who checks her laboratory.  I do not have her most recent lipid results.  Serum creatinine in  December 2022 was 0.77.  LFTs were normal.  She continues to be on Prolia for her bone disease.  She is on DuoNeb inhalation therapy for her asthma and as needed albuterol.  GERD is controlled with pantoprazole.  She is now followed by Dr. Claretta Fraise for her multiple sclerosis and at times has an MS "hug "contributing to chest wall sensation.  She has been walking more recently since she has a new dog.  She will be following up with Dr. Renne Crigler who will be checking laboratory.  A prior sleep study showed increased upper airway resistance without definitive sleep apnea overall and just very mild sleep apnea with REM sleep.  She believes she is sleeping well.  I will see her in 1 year for follow-up evaluation or sooner as needed.    Medication Adjustments/Labs and Tests Ordered: Current medicines are reviewed at length with the patient today.  Concerns regarding medicines are outlined above.  Medication changes, Labs and Tests ordered today are listed in the Patient Instructions below. Patient Instructions  Medication Instructions:  Your physician recommends that you continue on your current medications as directed. Please refer to the Current Medication list given to you today.  *If you need a refill on your cardiac medications before your next appointment, please call your pharmacy*  Follow-Up: At [facility], you and your health needs are our priority.  As part of our continuing mission to provide you with exceptional heart care, we have created designated Provider Care Teams.  These Care Teams include your primary Cardiologist (physician) and Advanced Practice Providers (APPs -  Physician Assistants and Nurse Practitioners) who all work together to provide you with the care you need, when you need it.  We recommend signing up for the patient portal called "MyChart".  Sign up information is provided on this After Visit Summary.  MyChart is used to connect with  patients for Virtual Visits  (Telemedicine).  Patients are able to view lab/test results, encounter notes, upcoming appointments, etc.  Non-urgent messages can be sent to your provider as well.   To learn more about what you can do with MyChart, go to [email or web address].    Your next appointment:   12 month(s)  Provider:   Nicki Guadalajara, MD        Signed, Nicki Guadalajara, MD  04/28/2022 4:06 PM    [facility] [address], [address], [place], [place]  [address] Phone: [phone]

## 2022-04-27 NOTE — Patient Instructions (Signed)
Medication Instructions:  Your physician recommends that you continue on your current medications as directed. Please refer to the Current Medication list given to you today.  *If you need a refill on your cardiac medications before your next appointment, please call your pharmacy*  Follow-Up: At [facility], you and your health needs are our priority.  As part of our continuing mission to provide you with exceptional heart care, we have created designated Provider Care Teams.  These Care Teams include your primary Cardiologist (physician) and Advanced Practice Providers (APPs -  Physician Assistants and Nurse Practitioners) who all work together to provide you with the care you need, when you need it.  We recommend signing up for the patient portal called "MyChart".  Sign up information is provided on this After Visit Summary.  MyChart is used to connect with patients for Virtual Visits (Telemedicine).  Patients are able to view lab/test results, encounter notes, upcoming appointments, etc.  Non-urgent messages can be sent to your provider as well.   To learn more about what you can do with MyChart, go to [email or web address].    Your next appointment:   12 month(s)  Provider:   Thomas Kelly, MD       

## 2022-04-28 ENCOUNTER — Other Ambulatory Visit: Payer: Self-pay | Admitting: Adult Health

## 2022-04-28 ENCOUNTER — Encounter: Payer: Self-pay | Admitting: Cardiovascular Disease

## 2022-05-12 ENCOUNTER — Other Ambulatory Visit: Payer: Self-pay

## 2022-05-12 DIAGNOSIS — M810 Age-related osteoporosis without current pathological fracture: Secondary | ICD-10-CM

## 2022-05-23 ENCOUNTER — Telehealth: Payer: Self-pay | Admitting: Pharmacy Technician

## 2022-05-23 NOTE — Telephone Encounter (Signed)
Auth Submission: APPROVED Payer: [employer] MEDICARE Medication & CPT/J Code(s) submitted: Prolia (Denosumab) [id number] Route of submission (phone, fax, portal):  Phone # [phone] Fax [phone] Auth type: Buy/Bill Units/visits requested: 2 Reference number: [id number] Approval from: 10/27/19 to 03/28/23

## 2022-06-09 ENCOUNTER — Ambulatory Visit (INDEPENDENT_AMBULATORY_CARE_PROVIDER_SITE_OTHER): Payer: Medicare PPO

## 2022-06-09 VITALS — BP 126/71 | HR 70 | Temp 98.3°F | Resp 20 | Ht 63.0 in | Wt 144.2 lb

## 2022-06-09 DIAGNOSIS — M810 Age-related osteoporosis without current pathological fracture: Secondary | ICD-10-CM | POA: Diagnosis not present

## 2022-06-09 MED ORDER — DENOSUMAB 60 MG/ML ~~LOC~~ SOSY
60.0000 mg | PREFILLED_SYRINGE | Freq: Once | SUBCUTANEOUS | Status: AC
  Administered 2022-06-09: 60 mg via SUBCUTANEOUS

## 2022-06-09 NOTE — Progress Notes (Signed)
10am, Pt is schedule for prolia injection today . Received faxed lab report from [facility] and her last calcium level was 8.8 which was done on 11/23/2021. Contacted back for recent result within 60 days. Per Lovena Le there is no recent calcium level is drawn at this time but its ok to give Prolia injection per provider. Also verified with pt that she is on Citracal twice a day and Vitamin D supplement.

## 2022-06-09 NOTE — Progress Notes (Signed)
Diagnosis: Osteoporosis  Provider:  Marshell Garfinkel MD  Procedure: Injection  Prolia (Denosumab), Dose: 60 mg, Site: subcutaneous, Number of injections: 1  Post Care: Patient declined observation  Discharge: Condition: Good, Destination: Home . AVS Declined  Performed by:  Fraser Din Pilkington-Burchett, RN

## 2022-06-13 ENCOUNTER — Other Ambulatory Visit: Payer: Self-pay | Admitting: Cardiovascular Disease

## 2022-06-14 DIAGNOSIS — M542 Cervicalgia: Secondary | ICD-10-CM | POA: Diagnosis not present

## 2022-06-16 DIAGNOSIS — M542 Cervicalgia: Secondary | ICD-10-CM | POA: Diagnosis not present

## 2022-06-22 ENCOUNTER — Other Ambulatory Visit: Payer: Self-pay

## 2022-06-22 DIAGNOSIS — M542 Cervicalgia: Secondary | ICD-10-CM | POA: Diagnosis not present

## 2022-06-24 DIAGNOSIS — M542 Cervicalgia: Secondary | ICD-10-CM | POA: Diagnosis not present

## 2022-06-28 ENCOUNTER — Telehealth: Payer: Self-pay | Admitting: Neurology

## 2022-06-28 DIAGNOSIS — L821 Other seborrheic keratosis: Secondary | ICD-10-CM | POA: Diagnosis not present

## 2022-06-28 DIAGNOSIS — L814 Other melanin hyperpigmentation: Secondary | ICD-10-CM | POA: Diagnosis not present

## 2022-06-28 DIAGNOSIS — D692 Other nonthrombocytopenic purpura: Secondary | ICD-10-CM | POA: Diagnosis not present

## 2022-06-28 DIAGNOSIS — M542 Cervicalgia: Secondary | ICD-10-CM | POA: Diagnosis not present

## 2022-06-28 DIAGNOSIS — D1801 Hemangioma of skin and subcutaneous tissue: Secondary | ICD-10-CM | POA: Diagnosis not present

## 2022-06-28 NOTE — Telephone Encounter (Signed)
LVM and sent mychart msg informing pt of appt change for 4/29- MD out.

## 2022-06-30 DIAGNOSIS — M542 Cervicalgia: Secondary | ICD-10-CM | POA: Diagnosis not present

## 2022-07-04 DIAGNOSIS — M542 Cervicalgia: Secondary | ICD-10-CM | POA: Diagnosis not present

## 2022-07-06 DIAGNOSIS — M542 Cervicalgia: Secondary | ICD-10-CM | POA: Diagnosis not present

## 2022-07-11 ENCOUNTER — Other Ambulatory Visit: Payer: Self-pay | Admitting: Internal Medicine

## 2022-07-11 DIAGNOSIS — M542 Cervicalgia: Secondary | ICD-10-CM | POA: Diagnosis not present

## 2022-07-14 DIAGNOSIS — M542 Cervicalgia: Secondary | ICD-10-CM | POA: Diagnosis not present

## 2022-07-18 DIAGNOSIS — M542 Cervicalgia: Secondary | ICD-10-CM | POA: Diagnosis not present

## 2022-07-21 DIAGNOSIS — M542 Cervicalgia: Secondary | ICD-10-CM | POA: Diagnosis not present

## 2022-07-25 ENCOUNTER — Ambulatory Visit: Payer: Medicare PPO | Admitting: Neurology

## 2022-07-26 ENCOUNTER — Telehealth: Payer: Self-pay | Admitting: Neurology

## 2022-07-26 NOTE — Telephone Encounter (Signed)
LVM and sent mychart msg informing pt of r/s needed for 5/1 appt- MD out.

## 2022-07-27 ENCOUNTER — Ambulatory Visit: Payer: Medicare PPO | Admitting: Neurology

## 2022-08-04 ENCOUNTER — Telehealth: Payer: Self-pay | Admitting: Neurology

## 2022-08-04 ENCOUNTER — Encounter: Payer: Self-pay | Admitting: Neurology

## 2022-08-04 ENCOUNTER — Ambulatory Visit: Payer: Medicare PPO | Admitting: Neurology

## 2022-08-04 VITALS — BP 115/67 | HR 62 | Ht 62.0 in | Wt 146.4 lb

## 2022-08-04 DIAGNOSIS — R3915 Urgency of urination: Secondary | ICD-10-CM

## 2022-08-04 DIAGNOSIS — G35 Multiple sclerosis: Secondary | ICD-10-CM

## 2022-08-04 DIAGNOSIS — R208 Other disturbances of skin sensation: Secondary | ICD-10-CM

## 2022-08-04 DIAGNOSIS — R269 Unspecified abnormalities of gait and mobility: Secondary | ICD-10-CM

## 2022-08-04 MED ORDER — BACLOFEN 10 MG PO TABS: ORAL_TABLET | ORAL | 4 refills | Status: AC

## 2022-08-04 MED ORDER — LAMOTRIGINE 25 MG PO TABS: ORAL_TABLET | ORAL | 11 refills | Status: DC

## 2022-08-04 NOTE — Telephone Encounter (Signed)
[name]: [id number] exp. 08/04/22-09/03/22 sent to GI [phone]

## 2022-08-04 NOTE — Progress Notes (Addendum)
[facility]  PATIENT: [name] DOB: 1945/06/17  REFERRING DOCTOR OR PCP: Fatima Sanger FNP SOURCE: Patient, notes from [facility] neurology (Dr. Tinnie Gens), imaging and lab reports, MRI images personally reviewed.  _________________________________   HISTORICAL  CHIEF COMPLAINT:  Chief Complaint  Patient presents with   Follow-up    Pt in room 11. Here for MS follow up. Pt said she believes she has MS hug, tripping. Had several MS hugs in March, blood was in 180's sys. Pt said now B/P in running 120"s now. Pt reports no falls.     HISTORY OF PRESENT ILLNESS:  [name] is a 77 year old woman who was diagnosed with MS  UPDATE 08/04/2022 She noted more of the MS hug sensation in March in chest and arms.  She has had the MS hug in the past but seemed worse in March 2024.   She had previously been on gabapentin and carbamazepine for the dysesthesias without much benefit.   ..   She also fel her gait was worse during that time.    She is walking fairly well an does not need a cane.  Her left leg sometimes buckles and is weaker than right - notes most while doing steps at a gym.     She has urinary urgency and some incontinence, usually leakage abd uses pads.    She has trouble readig even with reading glasses but distance vision is off.   Her left eye is worse than her right.    She has some neck pain and reduced ROM in neck.  This is better today than earlier in the week.     She reports fatigue.   She is not sleeping well at night due to leg pain.  She averages 6 hours.    Some is more dysesthetic and some is more aching.    She sometimes feels she has to move her legs to be comfortable.  She denies depression.    Cognition is usually fine.   She occasioanlly has trouble finding words.       MS history: [name] was diagnosed with MS in 2000 after presenting with sensation of not feeling right.   She had an MRI done showing white matter lesions.   She  also had some foot weakness at  the time.   She also had an LP that was reportedly negative.   She was placed initially in a drug study (not sure which one but it was a sq injectable one).   She then was placed on Copaxone for several years and then went off of all DMTs.     She began to experience a squeezing sensation intermittently lasting 15 minues at a time a few times a day for several days    She was started on Ocrevus in 2017 due to these sensory symptoms.      She reports stopping in late 2018 or 2019 due to two pneumonias.    She has been off any DMT since.    Imaging: MRI of the brain 12/12/2018 shows stable confluent T2/FLAIR hyperintense foci predominantly in the subcortical and deep white matter.  There were no foci in the infratentorial white matter.  A couple small foci are noted in the thalamus.  None of the foci enhance or appear to be acute.  The scan was unchanged compared to the 08/18/2017 MRI.  MRI of the cervical spine 04/06/2016 shows a normal spinal cord.  There are degenerative changes  from C3-C4 through C6-C7 Causing mild spinal stenosis.  At C3-C4, there is left greater than right foraminal narrowing with potential for left C4 nerve root compression.  At C4-C5, there is right greater than left foraminal narrowing with potential for compression of the right C5 nerve roots.  At C5-C6, there is right greater than left foraminal narrowing with potential for right C6 nerve root compression.  At C6-C7, there is bilateral foraminal narrowing though there did not appear to be nerve root compression.  REVIEW OF SYSTEMS: Constitutional: No fevers, chills, sweats, or change in appetite Eyes: No visual changes, double vision, eye pain Ear, nose and throat: No hearing loss, ear pain, nasal congestion, sore throat Cardiovascular: No chest pain, palpitations Respiratory:  No shortness of breath at rest or with exertion.   No wheezes GastrointestinaI: No nausea, vomiting, diarrhea, abdominal  pain, fecal incontinence Genitourinary:  No dysuria, urinary retention or frequency.  No nocturia. Musculoskeletal:  No neck pain, back pain Integumentary: No rash, pruritus, skin lesions Neurological: as above Psychiatric: No depression at this time.  No anxiety Endocrine: No palpitations, diaphoresis, change in appetite, change in weigh or increased thirst Hematologic/Lymphatic:  No anemia, purpura, petechiae. Allergic/Immunologic: No itchy/runny eyes, nasal congestion, recent allergic reactions, rashes  ALLERGIES: Allergies  Allergen Reactions   Crestor [Rosuvastatin] Other (See Comments)    Joint pain    Iodinated Contrast Media Other (See Comments)    Sneezing and itchy throat; dye was Isovue 300   Vicodin [Hydrocodone-Acetaminophen]     Made pass-out one time and is okay taking now   Isovue [Iopamidol]     Pt had sneezing and itching of her throat and soft palate.  Dr Gery Pray checked pt.  She will need premeds in the future.  J Bohm    HOME MEDICATIONS:  Current Outpatient Medications:    albuterol (VENTOLIN HFA) 108 (90 Base) MCG/ACT inhaler, Inhale 2 puffs into the lungs every 6 (six) hours as needed for wheezing or shortness of breath., Disp: 8 g, Rfl: 2   baclofen (LIORESAL) 10 MG tablet, Take 1/2 to 1 pill every night and 1 pill po qd prn spasticity, Disp: 60 each, Rfl: 11   Calcium Acetate, Phos Binder, (CALCIUM ACETATE PO), Take by mouth., Disp: , Rfl:    Cholecalciferol (VITAMIN D PO), Take 1 tablet by mouth daily., Disp: , Rfl:    denosumab (PROLIA) 60 MG/ML SOSY injection, 60 mg, Disp: , Rfl:    diltiazem (CARDIZEM CD) 240 MG 24 hr capsule, TAKE ONE CAPSULE BY MOUTH DAILY, Disp: 90 capsule, Rfl: 1   EPINEPHRINE 0.3 mg/0.3 mL IJ SOAJ injection, USE AS DIRECTED, Disp: 2 each, Rfl: 3   Evolocumab (REPATHA) 140 MG/ML SOSY, Inject into the skin., Disp: , Rfl:    ipratropium-albuterol (DUONEB) 0.5-2.5 (3) MG/3ML SOLN, Take 3 mLs by nebulization 2 (two) times daily. (Patient  taking differently: Take 3 mLs by nebulization 2 (two) times daily. Pt takes as needed), Disp: 360 mL, Rfl: 5   isosorbide mononitrate (IMDUR) 60 MG 24 hr tablet, TAKE 1 & 1/2 TABLETS ONCE DAILY, Disp: 135 tablet, Rfl: 3   lamoTRIgine (LAMICTAL) 25 MG tablet, For 5 days take one pill a day then take one pill po bid, Disp: 60 tablet, Rfl: 11   levofloxacin (LEVAQUIN) 500 MG tablet, Take 1 tablet (500 mg total) by mouth daily., Disp: 7 tablet, Rfl: 0   losartan (COZAAR) 50 MG tablet, Take 1 and a half tablets once daily., Disp: 45 tablet, Rfl: 2  Multiple Vitamin (MULTIVITAMIN ADULT PO), Take by mouth., Disp: , Rfl:    nitroGLYCERIN (NITROSTAT) 0.4 MG SL tablet, PLACE 1 TABLET UNDER THE TONGUE AS NEEDED FOR CHEST PAIN, MAY REPEAT EVERY 5 MINUTES., Disp: 25 tablet, Rfl: 3   pantoprazole (PROTONIX) 20 MG tablet, TAKE ONE TABLET BY MOUTH TWICE DAILY, Disp: 60 tablet, Rfl: 1   PARoxetine (PAXIL) 10 MG tablet, Take 5 mg by mouth daily. , Disp: , Rfl:    Respiratory Therapy Supplies (FLUTTER) DEVI, Use as directed, Disp: 1 each, Rfl: 0   SYMBICORT 80-4.5 MCG/ACT inhaler, INHALE TWO PUFFS INTO THE LUNGS EVERY MORNING AND AT BEDTIME, Disp: 10.2 g, Rfl: 12   ezetimibe (ZETIA) 10 MG tablet, 1 tablet (Patient not taking: Reported on 08/04/2022), Disp: , Rfl:    meclizine (ANTIVERT) 25 MG tablet, Take 1 tablet (25 mg total) by mouth 3 (three) times daily as needed for dizziness. (Patient not taking: Reported on 08/04/2022), Disp: 30 tablet, Rfl: 0  PAST MEDICAL HISTORY: Past Medical History:  Diagnosis Date   Anemia    past history-many yrs ago   Anginal pain (HCC)    being evaluated by Dr. Joella Prince, arm pain,"bad indigestion" -no heart related findings as of yet   Arthritis    hip. back pain   Complication of anesthesia    s/p Hysterectomy "vagal response "heart stopped" -did not require shocking.   Coronary artery disease    Dry eyes    Esophageal spasm    GERD (gastroesophageal reflux disease)    MS  (multiple sclerosis) (HCC)    stable-sees Tinnie Gens every 6 months    PAST SURGICAL HISTORY: Past Surgical History:  Procedure Laterality Date   ABDOMINAL HYSTERECTOMY     BLEPHAROPLASTY Bilateral    CARDIAC CATHETERIZATION  10/04/06   MINOR CAD,SINGLE VESSEL INVOLVING THE CIRCUMFLEX. 20 TO 30% PROXIMALLY AND 10 TO 20% IN THE MIDDLE SEGMENT.MILD MUSCLE BRIDGING, MID LAD.NORMAL RCA.NORMAL LV FUNCTION.NORMAL MITRAL AND AORTIC VALVE.NORMAL APPEARING AORTA,THORACIC AND ABDOMINAL.NORMAL RENAL ARTERIES.   CARDIOLOGY NUCLEAR MED STUDY  06/22/12   NL LV FUNCTION,EF 68%,NL WALL MOTION.   CAROTID DUPLEX  07/02/11   BJY:NWGN SOFT PLAQUE NOTED DISTAL CCA AND ORGIN AND PROXIMAL ICA,LEFT>RIGHT.NO ICA STENOSIS. VERTEBRAL ARTERY FLOW IS ANTEGRADE.   CESAREAN SECTION     x2    COLONOSCOPY WITH PROPOFOL N/A 12/11/2014   Procedure: COLONOSCOPY WITH PROPOFOL;  Surgeon: Bernette Redbird, MD;  Location: [facility];  Service: Endoscopy;  Laterality: N/A;   KNEE ARTHROSCOPY Left    scope   PARATHYROIDECTOMY     partial-many years ago   thumb surgery Bilateral    built up and bone removal   TONSILLECTOMY     TRANSTHORACIC ECHOCARDIOGRAM  07/02/11   LV CAVITY SIZE IS NORMAL. SYSTOLIC FUNCTION WAS NORMAL.EF=55% TO 60%.INCREASED RELATIVE CONTRIBUTION OF ATRIAL CONTRACTION TO VENTRICULAR FILLING;MAYBE DUE TO HYPOVOLEMIA. AV=MILD REGURG.    FAMILY HISTORY: Family History  Problem Relation Age of Onset   Heart disease Mother    Asthma Father    Bladder Cancer Father    Heart failure Father    Hyperlipidemia Brother     SOCIAL HISTORY:  Social History   Socioeconomic History   Marital status: Divorced    Spouse name: Not on file   Number of children: 3   Years of education: Not on file   Highest education level: Master's degree (e.g., MA, MS, MEng, MEd, MSW, MBA)  Occupational History   Occupation: retired  Tobacco Use   Smoking status:  Never   Smokeless tobacco: Never  Vaping Use   Vaping Use: Never  used  Substance and Sexual Activity   Alcohol use: Yes    Alcohol/week: 0.0 standard drinks of alcohol    Comment: wine occ.   Drug use: No   Sexual activity: Not on file  Other Topics Concern   Not on file  Social History Narrative   Lives alone and new puppy   R handed   Caffeine: 16oz a day   Social Determinants of Health   Financial Resource Strain: Not on file  Food Insecurity: Not on file  Transportation Needs: Not on file  Physical Activity: Not on file  Stress: Not on file  Social Connections: Not on file  Intimate Partner Violence: Not on file     PHYSICAL EXAM  Vitals:   08/04/22 1019  BP: 115/67  Pulse: 62  Weight: 146 lb 6.4 oz (66.4 kg)  Height: 5\' 2"  (1.575 m)     Body mass index is 26.78 kg/m.   General: The patient is well-developed and well-nourished and in no acute distress  HEENT:  Head is [place]/AT.  Sclera are anicteric.     Skin: Extremities are without rash or  edema.  Neurologic Exam  Mental status: The patient is alert and oriented x 3 at the time of the examination. The patient has apparent normal recent and remote memory, with an apparently normal attention span and concentration ability.   Speech is normal.  Cranial nerves: Extraocular movements are full. Facial strength is normal. . No obvious hearing deficits are noted.  Motor:  Muscle bulk is normal.   Tone is normal. Strength is  5 / 5 in all 4 extremities.   Sensory: Sensory testing is intact to pinprick, soft touch and vibration sensation in all 4 extremities.  Coordination: Cerebellar testing reveals good finger-nose-finger and heel-to-shin bilaterally.  Gait and station: Station is normal.   Gait is arthritic andminimally wide  The tandem gait is wide.  Romberg is negative.   Reflexes: Deep tendon reflexes are symmetric and normal in the arms and increased at the knees.  Ankle reflexes are normal..   Plantar responses are flexor.    DIAGNOSTIC DATA (LABS, IMAGING,  TESTING) - I reviewed patient records, labs, notes, testing and imaging myself where available.  Lab Results  Component Value Date   WBC 4.8 03/26/2021   HGB 13.4 03/26/2021   HCT 41.2 03/26/2021   MCV 91.2 03/26/2021   PLT 186.0 03/26/2021      Component Value Date/Time   NA 142 03/26/2021 0905   K 3.7 03/26/2021 0905   CL 107 03/26/2021 0905   CO2 28 03/26/2021 0905   GLUCOSE 77 03/26/2021 0905   BUN 11 03/26/2021 0905   CREATININE 0.77 03/26/2021 0905   CALCIUM 8.8 03/26/2021 0905   PROT 6.3 03/26/2021 0905   ALBUMIN 4.1 03/26/2021 0905   AST 26 03/26/2021 0905   ALT 48 (H) 03/26/2021 0905   ALKPHOS 57 03/26/2021 0905   BILITOT 0.3 03/26/2021 0905   GFRNONAA >60 08/03/2020 1603   GFRAA >60 03/25/2018 1701   Lab Results  Component Value Date   TSH 0.735 07/02/2011       ASSESSMENT AND PLAN  Multiple sclerosis (HCC) - Plan: MR BRAIN WO CONTRAST, MR CERVICAL SPINE WO CONTRAST  Dysesthesia - Plan: MR BRAIN WO CONTRAST, MR CERVICAL SPINE WO CONTRAST  Urgency of urination  Gait disturbance   She will remain off a DMT for  diagnosis of MS.    MRI's However, are nonspecific.  Continue baclofen.   Add lamotrigine low dose 25 mg po bid and titrate up if needed Consider an anticholinergic if bladder issues worsen. RTC 1 year  Delontae Lamm A. Epimenio Foot, MD, [facility] 08/04/2022, 10:55 AM Certified in Neurology, Clinical Neurophysiology, Sleep Medicine and Neuroimaging  [facility] [address], [address] [place], [place] [address] [phone]

## 2022-08-16 ENCOUNTER — Other Ambulatory Visit: Payer: Self-pay | Admitting: Cardiovascular Disease

## 2022-08-18 ENCOUNTER — Other Ambulatory Visit: Payer: Self-pay | Admitting: Cardiovascular Disease

## 2022-08-21 ENCOUNTER — Ambulatory Visit
Admission: RE | Admit: 2022-08-21 | Discharge: 2022-08-21 | Disposition: A | Discharge status: Home / Self Care | Payer: Medicare PPO | Source: Ambulatory Visit | Attending: Neurology | Admitting: Neurology

## 2022-08-21 DIAGNOSIS — G35 Multiple sclerosis: Secondary | ICD-10-CM | POA: Diagnosis not present

## 2022-08-21 DIAGNOSIS — R208 Other disturbances of skin sensation: Secondary | ICD-10-CM

## 2022-08-24 DIAGNOSIS — H903 Sensorineural hearing loss, bilateral: Secondary | ICD-10-CM | POA: Diagnosis not present

## 2022-08-24 DIAGNOSIS — J341 Cyst and mucocele of nose and nasal sinus: Secondary | ICD-10-CM | POA: Diagnosis not present

## 2022-09-06 DIAGNOSIS — R55 Syncope and collapse: Secondary | ICD-10-CM | POA: Diagnosis not present

## 2022-09-06 DIAGNOSIS — R1084 Generalized abdominal pain: Secondary | ICD-10-CM | POA: Diagnosis not present

## 2022-09-11 ENCOUNTER — Other Ambulatory Visit: Payer: Self-pay

## 2022-09-11 ENCOUNTER — Encounter (HOSPITAL_BASED_OUTPATIENT_CLINIC_OR_DEPARTMENT_OTHER): Payer: Self-pay

## 2022-09-11 ENCOUNTER — Emergency Department (HOSPITAL_BASED_OUTPATIENT_CLINIC_OR_DEPARTMENT_OTHER)
Admission: EM | Admit: 2022-09-11 | Discharge: 2022-09-12 | Disposition: A | Discharge status: Home / Self Care | Payer: Medicare PPO | Attending: Emergency Medicine | Admitting: Emergency Medicine

## 2022-09-11 DIAGNOSIS — B9689 Other specified bacterial agents as the cause of diseases classified elsewhere: Secondary | ICD-10-CM | POA: Diagnosis not present

## 2022-09-11 DIAGNOSIS — N390 Urinary tract infection, site not specified: Secondary | ICD-10-CM | POA: Insufficient documentation

## 2022-09-11 DIAGNOSIS — J45909 Unspecified asthma, uncomplicated: Secondary | ICD-10-CM | POA: Insufficient documentation

## 2022-09-11 DIAGNOSIS — Z7951 Long term (current) use of inhaled steroids: Secondary | ICD-10-CM | POA: Insufficient documentation

## 2022-09-11 DIAGNOSIS — R309 Painful micturition, unspecified: Secondary | ICD-10-CM | POA: Diagnosis present

## 2022-09-11 HISTORY — DX: Unspecified asthma, uncomplicated: J45.909

## 2022-09-11 LAB — URINALYSIS, ROUTINE W REFLEX MICROSCOPIC
Bacteria, UA: NONE SEEN
Bilirubin Urine: NEGATIVE
Glucose, UA: NEGATIVE mg/dL
Ketones, ur: NEGATIVE mg/dL
Nitrite: NEGATIVE
Protein, ur: NEGATIVE mg/dL
Specific Gravity, Urine: <1.005 — ABNORMAL LOW (ref 1.005–1.030)
pH: 5 (ref 5.0–8.0)

## 2022-09-11 NOTE — ED Triage Notes (Signed)
POV from home, A&O x 4, GCS 15, amb to triage  C/o pain with urination, possible blood in urine per pt, pelvic pain when urinating. Began today.

## 2022-09-12 MED ORDER — CEPHALEXIN 250 MG PO CAPS
500.0000 mg | ORAL_CAPSULE | Freq: Once | ORAL | Status: AC
  Administered 2022-09-12: 500 mg via ORAL
  Filled 2022-09-12: qty 2

## 2022-09-12 MED ORDER — CEPHALEXIN 500 MG PO CAPS: 500.0000 mg | ORAL_CAPSULE | Freq: Two times a day (BID) | ORAL | 0 refills | Status: AC

## 2022-09-12 NOTE — ED Provider Notes (Signed)
[facility] Provider Note: Robyn Dell, MD, FACEP  CSN: [id number] MRN: [id number] ARRIVAL: 09/11/22 at 2135 ROOM: DB010/DB010   CHIEF COMPLAINT  Dysuria   HISTORY OF PRESENT ILLNESS  09/12/22 12:46 AM [name] is a 77 y.o. female with painful urination that began yesterday.  She thinks she may have had blood in her urine as well.  She describes the pain as moderate.  She has not had a fever or chills.   Past Medical History:  Diagnosis Date   Anemia    past history-many yrs ago   Anginal pain (HCC)    being evaluated by Dr. Joella Prince, arm pain,"bad indigestion" -no heart related findings as of yet   Arthritis    hip. back pain   Asthma    Complication of anesthesia    s/p Hysterectomy "vagal response "heart stopped" -did not require shocking.   Coronary artery disease    Dry eyes    Esophageal spasm    GERD (gastroesophageal reflux disease)    MS (multiple sclerosis) (HCC)    stable-sees Jeffrey every 6 months    Past Surgical History:  Procedure Laterality Date   ABDOMINAL HYSTERECTOMY     BLEPHAROPLASTY Bilateral    CARDIAC CATHETERIZATION  10/04/06   MINOR CAD,SINGLE VESSEL INVOLVING THE CIRCUMFLEX. 20 TO 30% PROXIMALLY AND 10 TO 20% IN THE MIDDLE SEGMENT.MILD MUSCLE BRIDGING, MID LAD.NORMAL RCA.NORMAL LV FUNCTION.NORMAL MITRAL AND AORTIC VALVE.NORMAL APPEARING AORTA,THORACIC AND ABDOMINAL.NORMAL RENAL ARTERIES.   CARDIOLOGY NUCLEAR MED STUDY  06/22/12   NL LV FUNCTION,EF 68%,NL WALL MOTION.   CAROTID DUPLEX  07/02/11   NWG:NFAO SOFT PLAQUE NOTED DISTAL CCA AND ORGIN AND PROXIMAL ICA,LEFT>RIGHT.NO ICA STENOSIS. VERTEBRAL ARTERY FLOW IS ANTEGRADE.   CESAREAN SECTION     x2    COLONOSCOPY WITH PROPOFOL N/A 12/11/2014   Procedure: COLONOSCOPY WITH PROPOFOL;  Surgeon: Bernette Redbird, MD;  Location: [facility];  Service: Endoscopy;  Laterality: N/A;   KNEE ARTHROSCOPY Left    scope   PARATHYROIDECTOMY     partial-many years ago   thumb surgery  Bilateral    built up and bone removal   TONSILLECTOMY     TRANSTHORACIC ECHOCARDIOGRAM  07/02/11   LV CAVITY SIZE IS NORMAL. SYSTOLIC FUNCTION WAS NORMAL.EF=55% TO 60%.INCREASED RELATIVE CONTRIBUTION OF ATRIAL CONTRACTION TO VENTRICULAR FILLING;MAYBE DUE TO HYPOVOLEMIA. AV=MILD REGURG.    Family History  Problem Relation Age of Onset   Heart disease Mother    Asthma Father    Bladder Cancer Father    Heart failure Father    Hyperlipidemia Brother     Social History   Tobacco Use   Smoking status: Never   Smokeless tobacco: Never  Vaping Use   Vaping Use: Never used  Substance Use Topics   Alcohol use: Yes    Alcohol/week: 0.0 standard drinks of alcohol    Comment: wine occ.   Drug use: No    Prior to Admission medications   Medication Sig Start Date End Date Taking? Authorizing Provider  cephALEXin (KEFLEX) 500 MG capsule Take 1 capsule (500 mg total) by mouth 2 (two) times daily for 5 days. 09/12/22 09/17/22 Yes Jatavion Peaster, MD  albuterol (VENTOLIN HFA) 108 (90 Base) MCG/ACT inhaler Inhale 2 puffs into the lungs every 6 (six) hours as needed for wheezing or shortness of breath. 12/14/21   Parrett, Virgel Bouquet, NP  baclofen (LIORESAL) 10 MG tablet Take 1/2 to 1 pill every night and 1 pill po qd prn spasticity 08/04/22  Sater, Pearletha Furl, MD  Calcium Acetate, Phos Binder, (CALCIUM ACETATE PO) Take by mouth.    [provider]  Cholecalciferol (VITAMIN D PO) Take 1 tablet by mouth daily.    [provider]  denosumab (PROLIA) 60 MG/ML SOSY injection 60 mg 10/31/19   [provider]  diltiazem (CARDIZEM CD) 240 MG 24 hr capsule TAKE ONE CAPSULE BY MOUTH DAILY 08/18/22   Lennette Bihari, MD  EPINEPHRINE 0.3 mg/0.3 mL IJ SOAJ injection USE AS DIRECTED 01/20/20   Kalman Shan, MD  Evolocumab (REPATHA) 140 MG/ML SOSY Inject into the skin.    [provider]  ipratropium-albuterol (DUONEB) 0.5-2.5 (3) MG/3ML SOLN Take 3 mLs by nebulization 2 (two) times  daily. Patient taking differently: Take 3 mLs by nebulization 2 (two) times daily. Pt takes as needed 01/16/20   Kalman Shan, MD  isosorbide mononitrate (IMDUR) 60 MG 24 hr tablet TAKE 1 & 1/2 TABLETS ONCE DAILY 06/14/22   Lennette Bihari, MD  lamoTRIgine (LAMICTAL) 25 MG tablet For 5 days take one pill a day then take one pill po bid 08/04/22   Sater, Pearletha Furl, MD  levofloxacin (LEVAQUIN) 500 MG tablet Take 1 tablet (500 mg total) by mouth daily. 11/23/21   Luciano Cutter, MD  losartan (COZAAR) 50 MG tablet TAKE 1 & 1/2 TABLETS ONCE DAILY 08/16/22   Lennette Bihari, MD  Multiple Vitamin (MULTIVITAMIN ADULT PO) Take by mouth.    [provider]  nitroGLYCERIN (NITROSTAT) 0.4 MG SL tablet PLACE 1 TABLET UNDER THE TONGUE AS NEEDED FOR CHEST PAIN, MAY REPEAT EVERY 5 MINUTES. 11/26/21   Lennette Bihari, MD  pantoprazole (PROTONIX) 20 MG tablet TAKE ONE TABLET BY MOUTH TWICE DAILY 07/12/22   Kalman Shan, MD  PARoxetine (PAXIL) 10 MG tablet Take 5 mg by mouth daily.     [provider]  Respiratory Therapy Supplies (FLUTTER) DEVI Use as directed 03/23/17   Waymon Budge, MD  SYMBICORT 80-4.5 MCG/ACT inhaler INHALE TWO PUFFS INTO THE LUNGS EVERY MORNING AND AT BEDTIME 03/17/22   Kalman Shan, MD    Allergies Crestor [rosuvastatin], Iodinated contrast media, Vicodin [hydrocodone-acetaminophen], and Isovue [iopamidol]   REVIEW OF SYSTEMS  Negative except as noted here or in the History of Present Illness.   PHYSICAL EXAMINATION  Initial Vital Signs Blood pressure 135/69, pulse 72, temperature 98.4 F (36.9 C), resp. rate 18, height 5\' 2"  (1.575 m), weight 64.4 kg, SpO2 95 %.  Examination General: Well-developed, well-nourished female in no acute distress; appearance consistent with age of record HENT: normocephalic; atraumatic Eyes: Normal appearance Neck: supple Heart: regular rate and rhythm Lungs: clear to auscultation bilaterally Abdomen: soft;  nondistended; nontender; bowel sounds present Extremities: No deformity; full range of motion; pulses normal Neurologic: Awake, alert and oriented; motor function intact in all extremities and symmetric; no facial droop Skin: Warm and dry Psychiatric: Normal mood and affect   RESULTS  Summary of this visit's results, reviewed and interpreted by myself:   EKG Interpretation  Date/Time:    Ventricular Rate:    PR Interval:    QRS Duration:   QT Interval:    QTC Calculation:   R Axis:     Text Interpretation:         Laboratory Studies: Results for orders placed or performed during the hospital encounter of 09/11/22 (from the past 24 hour(s))  Urinalysis, Routine w reflex microscopic -Urine, Clean Catch     Status: Abnormal   Collection Time:  09/11/22  9:46 PM  Result Value Ref Range   Color, Urine COLORLESS (A) YELLOW   APPearance CLEAR CLEAR   Specific Gravity, Urine <1.005 (L) 1.005 - 1.030   pH 5.0 5.0 - 8.0   Glucose, UA NEGATIVE NEGATIVE mg/dL   Hgb urine dipstick MODERATE (A) NEGATIVE   Bilirubin Urine NEGATIVE NEGATIVE   Ketones, ur NEGATIVE NEGATIVE mg/dL   Protein, ur NEGATIVE NEGATIVE mg/dL   Nitrite NEGATIVE NEGATIVE   Leukocytes,Ua SMALL (A) NEGATIVE   RBC / HPF 0-5 0 - 5 RBC/hpf   WBC, UA 11-20 0 - 5 WBC/hpf   Bacteria, UA NONE SEEN NONE SEEN   Squamous Epithelial / HPF 0-5 0 - 5 /HPF   Imaging Studies: No results found.  ED COURSE and MDM  Nursing notes, initial and subsequent vitals signs, including pulse oximetry, reviewed and interpreted by myself.  Vitals:   09/11/22 2235 09/11/22 2300 09/11/22 2315 09/12/22 0000  BP: 137/85 128/73  135/69  Pulse: 80 73 78 72  Resp: 16 18    Temp:      SpO2: 100% 94% 95% 95%  Weight:      Height:       Medications  cephALEXin (KEFLEX) capsule 500 mg (has no administration in time range)    Symptoms and urinalysis are consistent with a urinary tract infection.  Urine has been sent for culture.  We will  start her on Keflex.  PROCEDURES  Procedures   ED DIAGNOSES     ICD-10-CM   1. Lower urinary tract infectious disease  N39.0          Khristie Sak, MD 09/12/22 [phone]

## 2022-09-13 LAB — URINE CULTURE

## 2022-09-19 ENCOUNTER — Telehealth: Payer: Self-pay

## 2022-09-19 NOTE — Telephone Encounter (Signed)
Transition Care Management Follow-up Telephone Call Date of discharge and from where: 09/12/2022 [facility] How have you been since you were released from the hospital? Patient stated she is feeling much better Any questions or concerns? No  Items Reviewed: Did the pt receive and understand the discharge instructions provided? Yes  Medications obtained and verified? Yes  Other? No  Any new allergies since your discharge? No  Dietary orders reviewed? Yes Do you have support at home? Yes   Follow up appointments reviewed:  PCP Hospital f/u appt confirmed? No  Scheduled to see  on  @ . Specialist Hospital f/u appt confirmed? No  Scheduled to see  on  @ . Are transportation arrangements needed? No  If their condition worsens, is the pt aware to call PCP or go to the Emergency Dept.? Yes Was the patient provided with contact information for the PCP's office or ED? Yes Was to pt encouraged to call back with questions or concerns? Yes  [facility] [employer]  [facility] Population Health Community Resource Care Guide   [email or web address]  ?? [phone]   Website: [email or web address]  [email or web address]

## 2022-09-28 DIAGNOSIS — E039 Hypothyroidism, unspecified: Secondary | ICD-10-CM | POA: Diagnosis not present

## 2022-09-28 DIAGNOSIS — I10 Essential (primary) hypertension: Secondary | ICD-10-CM | POA: Diagnosis not present

## 2022-09-28 DIAGNOSIS — M810 Age-related osteoporosis without current pathological fracture: Secondary | ICD-10-CM | POA: Diagnosis not present

## 2022-10-03 DIAGNOSIS — J45909 Unspecified asthma, uncomplicated: Secondary | ICD-10-CM | POA: Diagnosis not present

## 2022-10-03 DIAGNOSIS — G35 Multiple sclerosis: Secondary | ICD-10-CM | POA: Diagnosis not present

## 2022-10-03 DIAGNOSIS — K219 Gastro-esophageal reflux disease without esophagitis: Secondary | ICD-10-CM | POA: Diagnosis not present

## 2022-10-03 DIAGNOSIS — Z0000 Encounter for general adult medical examination without abnormal findings: Secondary | ICD-10-CM | POA: Diagnosis not present

## 2022-10-03 DIAGNOSIS — I10 Essential (primary) hypertension: Secondary | ICD-10-CM | POA: Diagnosis not present

## 2022-10-03 DIAGNOSIS — I2510 Atherosclerotic heart disease of native coronary artery without angina pectoris: Secondary | ICD-10-CM | POA: Diagnosis not present

## 2022-10-03 DIAGNOSIS — Z86010 Personal history of colonic polyps: Secondary | ICD-10-CM | POA: Diagnosis not present

## 2022-10-03 DIAGNOSIS — I700 Atherosclerosis of aorta: Secondary | ICD-10-CM | POA: Diagnosis not present

## 2022-10-03 DIAGNOSIS — E559 Vitamin D deficiency, unspecified: Secondary | ICD-10-CM | POA: Diagnosis not present

## 2022-10-12 ENCOUNTER — Emergency Department (HOSPITAL_BASED_OUTPATIENT_CLINIC_OR_DEPARTMENT_OTHER): Payer: Medicare PPO

## 2022-10-12 ENCOUNTER — Encounter (HOSPITAL_BASED_OUTPATIENT_CLINIC_OR_DEPARTMENT_OTHER): Payer: Self-pay

## 2022-10-12 ENCOUNTER — Emergency Department (HOSPITAL_BASED_OUTPATIENT_CLINIC_OR_DEPARTMENT_OTHER)
Admission: EM | Admit: 2022-10-12 | Discharge: 2022-10-12 | Disposition: A | Discharge status: Home / Self Care | Payer: Medicare PPO | Attending: Emergency Medicine | Admitting: Emergency Medicine

## 2022-10-12 DIAGNOSIS — U071 COVID-19: Secondary | ICD-10-CM | POA: Insufficient documentation

## 2022-10-12 DIAGNOSIS — R059 Cough, unspecified: Secondary | ICD-10-CM | POA: Diagnosis not present

## 2022-10-12 LAB — COMPREHENSIVE METABOLIC PANEL
ALT: 15 U/L (ref 0–44)
AST: 15 U/L (ref 15–41)
Albumin: 4.5 g/dL (ref 3.5–5.0)
Alkaline Phosphatase: 49 U/L (ref 38–126)
Anion gap: 8 (ref 5–15)
BUN: 14 mg/dL (ref 8–23)
CO2: 25 mmol/L (ref 22–32)
Calcium: 9.1 mg/dL (ref 8.9–10.3)
Chloride: 105 mmol/L (ref 98–111)
Creatinine, Ser: 0.7 mg/dL (ref 0.44–1.00)
GFR, Estimated: >60 mL/min (ref >60)
Glucose, Bld: 107 mg/dL — ABNORMAL HIGH (ref 70–99)
Potassium: 4 mmol/L (ref 3.5–5.1)
Sodium: 138 mmol/L (ref 135–145)
Total Bilirubin: 0.4 mg/dL (ref 0.3–1.2)
Total Protein: 6.7 g/dL (ref 6.5–8.1)

## 2022-10-12 MED ORDER — PAXLOVID (300/100) 20 X 150 MG & 10 X 100MG PO TBPK: 3.0000 | ORAL_TABLET | Freq: Two times a day (BID) | ORAL | 0 refills | Status: AC

## 2022-10-12 NOTE — ED Provider Notes (Signed)
[place] EMERGENCY DEPARTMENT AT [facility] Provider Note   CSN: [id number] Arrival date & time: 10/12/22  1805     History  Chief Complaint  Patient presents with   Covid Positive    [name] is a 77 y.o. female, history of MS, who presents to the ED secondary to sore throat, dry cough, fevers for the last couple days.  She states she tested positive at home yesterday, and was told by her primary care doctor to come into the ER.  She denies any blood in her cough, severe shortness of breath, or weakness.  States it feels like she has a flu.    Home Medications Prior to Admission medications   Medication Sig Start Date End Date Taking? Authorizing Provider  nirmatrelvir & ritonavir (PAXLOVID, 300/100,) 20 x 150 MG & 10 x 100MG  TBPK Take 3 tablets by mouth 2 (two) times daily for 5 days. 10/12/22 10/17/22 Yes Maybelline Kolarik L, PA  albuterol (VENTOLIN HFA) 108 (90 Base) MCG/ACT inhaler Inhale 2 puffs into the lungs every 6 (six) hours as needed for wheezing or shortness of breath. 12/14/21   Parrett, Virgel Bouquet, NP  baclofen (LIORESAL) 10 MG tablet Take 1/2 to 1 pill every night and 1 pill po qd prn spasticity 08/04/22   Sater, Pearletha Furl, MD  Calcium Acetate, Phos Binder, (CALCIUM ACETATE PO) Take by mouth.    [provider]  Cholecalciferol (VITAMIN D PO) Take 1 tablet by mouth daily.    [provider]  denosumab (PROLIA) 60 MG/ML SOSY injection 60 mg 10/31/19   [provider]  diltiazem (CARDIZEM CD) 240 MG 24 hr capsule TAKE ONE CAPSULE BY MOUTH DAILY 08/18/22   Lennette Bihari, MD  EPINEPHRINE 0.3 mg/0.3 mL IJ SOAJ injection USE AS DIRECTED 01/20/20   Kalman Shan, MD  Evolocumab (REPATHA) 140 MG/ML SOSY Inject into the skin.    [provider]  ipratropium-albuterol (DUONEB) 0.5-2.5 (3) MG/3ML SOLN Take 3 mLs by nebulization 2 (two) times daily. Patient taking differently: Take 3 mLs by nebulization 2 (two) times daily. Pt takes as  needed 01/16/20   Kalman Shan, MD  isosorbide mononitrate (IMDUR) 60 MG 24 hr tablet TAKE 1 & 1/2 TABLETS ONCE DAILY 06/14/22   Lennette Bihari, MD  lamoTRIgine (LAMICTAL) 25 MG tablet For 5 days take one pill a day then take one pill po bid 08/04/22   Sater, Pearletha Furl, MD  levofloxacin (LEVAQUIN) 500 MG tablet Take 1 tablet (500 mg total) by mouth daily. 11/23/21   Luciano Cutter, MD  losartan (COZAAR) 50 MG tablet TAKE 1 & 1/2 TABLETS ONCE DAILY 08/16/22   Lennette Bihari, MD  Multiple Vitamin (MULTIVITAMIN ADULT PO) Take by mouth.    [provider]  nitroGLYCERIN (NITROSTAT) 0.4 MG SL tablet PLACE 1 TABLET UNDER THE TONGUE AS NEEDED FOR CHEST PAIN, MAY REPEAT EVERY 5 MINUTES. 11/26/21   Lennette Bihari, MD  pantoprazole (PROTONIX) 20 MG tablet TAKE ONE TABLET BY MOUTH TWICE DAILY 07/12/22   Kalman Shan, MD  PARoxetine (PAXIL) 10 MG tablet Take 5 mg by mouth daily.     [provider]  Respiratory Therapy Supplies (FLUTTER) DEVI Use as directed 03/23/17   Waymon Budge, MD  SYMBICORT 80-4.5 MCG/ACT inhaler INHALE TWO PUFFS INTO THE LUNGS EVERY MORNING AND AT BEDTIME 03/17/22   Kalman Shan, MD      Allergies    Crestor [rosuvastatin], Iodinated contrast media, Vicodin [hydrocodone-acetaminophen], and  Isovue [iopamidol]    Review of Systems   Review of Systems  Respiratory:  Positive for cough.   Cardiovascular:  Negative for chest pain.    Physical Exam Updated Vital Signs BP 114/88   Pulse 84   Temp 98.4 F (36.9 C)   Resp 16   SpO2 100%  Physical Exam Vitals and nursing note reviewed.  Constitutional:      General: She is not in acute distress.    Appearance: She is well-developed.  HENT:     Head: Normocephalic and atraumatic.  Eyes:     Conjunctiva/sclera: Conjunctivae normal.  Cardiovascular:     Rate and Rhythm: Normal rate and regular rhythm.     Heart sounds: No murmur heard. Pulmonary:     Effort: Pulmonary effort is normal. No  respiratory distress.     Breath sounds: Normal breath sounds.  Abdominal:     Palpations: Abdomen is soft.     Tenderness: There is no abdominal tenderness.  Musculoskeletal:        General: No swelling.     Cervical back: Neck supple.  Skin:    General: Skin is warm and dry.     Capillary Refill: Capillary refill takes less than 2 seconds.  Neurological:     Mental Status: She is alert.  Psychiatric:        Mood and Affect: Mood normal.     ED Results / Procedures / Treatments   Labs (all labs ordered are listed, but only abnormal results are displayed) Labs Reviewed  COMPREHENSIVE METABOLIC PANEL - Abnormal; Notable for the following components:      Result Value   Glucose, Bld 107 (*)    All other components within normal limits    EKG None  Radiology DG Chest Portable 1 View  Result Date: 10/12/2022 CLINICAL DATA:  Cough, COVID positive EXAM: PORTABLE CHEST 1 VIEW COMPARISON:  12/14/2021 FINDINGS: Cardiac size is within normal limits. Apparent shift of mediastinum to the right may be due to rotation. There are no signs of pulmonary edema or focal pulmonary consolidation. There is no pleural effusion or pneumothorax. IMPRESSION: No active cardiopulmonary disease. Electronically Signed   By: Ernie Avena M.D.   On: 10/12/2022 20:41    Procedures Procedures    Medications Ordered in ED Medications - No data to display  ED Course/ Medical Decision Making/ A&P                             Medical Decision Making Discussed with pharmacist, and [facility], ER, patient is eligible for Paxlovid, we discussed her medications, and pharmacy recommended that she half her Paxil, and diltiazem, and check her blood pressure before taking her losartan.  Patient was given strict and information on this.  CMP was obtained as well as chest x-ray to evaluate for any renal deficiencies, as well as pneumonia.  Amount and/or Complexity of Data Reviewed Labs: ordered.     Details: No renal deficiencies Radiology: ordered.    Details: Chest x-ray clear Discussion of management or test interpretation with external provider(s): Discussed with patient, chest x-ray is reassuring, no renal deficiencies.  Discharged with Paxlovid prescription.  Not available at [facility], was not ordered.  Sent to [facility] per patient's request.  Discussed return precautions she voiced understanding.  She is aware of the recommendations from the pharmacist for her diltiazem, and Paxil  Risk Prescription drug management.  Final Clinical Impression(s) / ED Diagnoses Final diagnoses:  COVID-19    Rx / DC Orders ED Discharge Orders          Ordered    nirmatrelvir & ritonavir (PAXLOVID, 300/100,) 20 x 150 MG & 10 x 100MG  TBPK  2 times daily        10/12/22 2103              Pete Pelt, [place] 10/12/22 2114    Benjiman Core, MD 10/13/22 [phone]

## 2022-10-12 NOTE — Discharge Instructions (Signed)
Please make sure that you cut your paroxetine in half, and only take half of your diltiazem.  These medications will increase in your bloodstream, given the use of the Paxlovid, so you have to be careful.  You should cut your diltiazem in half, and if your blood pressure is less than 130/80, do not take your losartan.  Return to the ER if you have severe shortness of breath, or chest pain.

## 2022-10-12 NOTE — ED Notes (Signed)
Pt tested positive for Covid on Monday night and was advised by her PCP to come here for evaluation.   +dyspnea with exertion, cough and fatigue.

## 2022-10-12 NOTE — ED Triage Notes (Signed)
Pt reports a positive covid test last night, states that she called her PCP & was advised to come here. Reports HA, sore throat, fatigue, cough, chills.

## 2022-10-21 ENCOUNTER — Telehealth: Payer: Self-pay

## 2022-10-21 NOTE — Telephone Encounter (Signed)
Transition Care Management Unsuccessful Follow-up Telephone Call  Date of discharge and from where:  10/12/2022 [facility]  Attempts:  1st Attempt  Reason for unsuccessful TCM follow-up call:  Left voice message   [facility]  [facility] Population Health Community Resource Care Guide   [email or web address]  ?? [phone]   Website: [email or web address]  [email or web address]

## 2022-10-21 NOTE — Telephone Encounter (Signed)
Transition Care Management Follow-up Telephone Call Date of discharge and from where: 10/12/2022 [facility] How have you been since you were released from the hospital? Patient is feeling better. Any questions or concerns? No  Items Reviewed: Did the pt receive and understand the discharge instructions provided? Yes  Medications obtained and verified? Yes  Other? No  Any new allergies since your discharge? No  Dietary orders reviewed? Yes Do you have support at home? Yes   Follow up appointments reviewed:  PCP Hospital f/u appt confirmed? No  Scheduled to see  on  @ . Specialist Hospital f/u appt confirmed? No  Scheduled to see  on  @ . Are transportation arrangements needed? No  If their condition worsens, is the pt aware to call PCP or go to the Emergency Dept.? Yes Was the patient provided with contact information for the PCP's office or ED? Yes Was to pt encouraged to call back with questions or concerns? Yes  [name] [name]  [facility] Population Health Community Resource Care Guide   [name]  ?? [phone]   Website: [email or web address]  [email or web address]

## 2022-10-24 ENCOUNTER — Telehealth: Payer: Self-pay | Admitting: Internal Medicine

## 2022-10-24 MED ORDER — AZITHROMYCIN 250 MG PO TABS: 250.0000 mg | ORAL_TABLET | ORAL | 0 refills | Status: DC

## 2022-10-24 NOTE — Telephone Encounter (Signed)
Sorry to hear that she has been not doing well.  Even though she tested positive again for COVID it is still all the same infection from her initial COVID 10/11/22  May have a secondary bronchitis .  May begin a Z-Pak No. 1 take as directed.  Claritin daily as needed for sinus drainage.  Fluids and rest. Continue on current asthma regimen Office visit if not improving or getting worse Please contact office for sooner follow up if symptoms do not improve or worsen or seek emergency care

## 2022-10-24 NOTE — Telephone Encounter (Signed)
Called and spoke with patient.  Patient stated she tested positive for covid 10/11/22 and was started Paxlovid 10/13/22.  Patient stated she finished Paxlovid and symptoms disappeared.  Patient stated she started feeling bad 10/22/22 with sore throat, nonproductive cough, sinus congestion, and runny nose.  Patient stated she tested for covid 10/22/22 and was again positive.  Patient stated she still feels bad with sinus congestion and sore throat.  Patient stated she had a cough this morning, but her cough has now stopped.  Patient has tested for covid again today and test was positive.  Patient is concerned with rebound covid she has read about with taking Paxlovid.  Patient is scheduled to leave for the beach Thursday and is concerned about still being contagious. Patient is seen in office by Dr. Marchelle Gearing for moderate asthma, but has also been seen in clinic by Tammy,NP.     Message routed to Tammy,NP to advise

## 2022-10-24 NOTE — Telephone Encounter (Signed)
Spoke with the pt and notified of recs per Tammy  She verbalized understanding  Nothing further needed Rx sent to pharm

## 2022-11-01 DIAGNOSIS — M542 Cervicalgia: Secondary | ICD-10-CM | POA: Diagnosis not present

## 2022-11-01 DIAGNOSIS — Z6826 Body mass index (BMI) 26.0-26.9, adult: Secondary | ICD-10-CM | POA: Diagnosis not present

## 2022-11-03 NOTE — Patient Instructions (Addendum)
ICD-10-CM   1. Moderate persistent asthma, unspecified whether complicated  J45.40     2. Eosinophilic asthma  Z61.09 POCT EXHALED NITRIC OXIDE    3. History of 2019 novel coronavirus disease (COVID-19)  Z86.16     4. Laryngospasm  J38.5       -Off Dupixent since April 2023 and since then 1 episode of flareup in August 2023 and then COVID in July 2024.  Currently in August 2024 post COVID you are having some laryngospasm  Plan -We will try empiric prednisone for the laryngospasm Please take prednisone 40 mg x1 day, then 30 mg x1 day, then 20 mg x1 day, then 10 mg x1 day, and then 5 mg x1 day and stop Make sure taking your Protonix as before Chew well and eat slowly and swallow slowly Sleep with head end of the bed elevated or a couple of pillows   -continue symbicort as before -Use albuterol as needed  - ok to monitor without dupixent at this moment  Follow-up -If laryngospasms continues to be a problem please let us know and we might refer you to neurology or to ENT -6 months do exhaled nitric oxide testing  = Return to see Dr Marchelle Gearing in 6 months or sooner if needed  - ACT/FEno at follow-up

## 2022-11-03 NOTE — Progress Notes (Signed)
OV 11/30/2017  Subjective:  Patient ID: [name], female , DOB: 06/21/1945 , age 77 y.o. , MRN: [id number] , ADDRESS: [address] [place] [facility] [address]   11/30/2017 -   Chief Complaint  Patient presents with   Follow-up    Pt states she has been doing good since last visit except states she has had a headache x2 days and has had a cough. Pt also has c/o chest tightness.     HPI [name] 77 y.o. -follow-up severe persistent asthma with poorly controlled symptoms. She is on Dulera, Singulair and biologic Nucala. She reports that currently it is one of her better days and weeks. Despite that asthma control questionnaire shows significant symptom with the 5 point score of 1.8. She says she is not waking up in the middle of the night because of asthma when she wakes up she is very mild symptoms when she is very slightly limited in her activities and she is moderately short of breath and wheezing a lot of the time but not using much albuterol for rescue. This is despite compliance. Last visit we switched her to Dupixent but this is stuck with insurance pre-authorization required and that is some delays with this. In addition she also tells me that at every visit she's been giving sputum samples at our lab basementand she is unclear why no one called her with results. I have told her that I was unaware that this was going on. Review of the charts indicate sputum samples given October 2018 in April 2019. Along and consistent with a respiratory in asthma symptoms at that early morning she has cough with yellow sputum which is consistent with high airway eosinophil load as evidenced by significantly high exhaled nitric oxide is despite biologic therapy.  [name]  ROS - per HPI       OV 03/01/2018  Subjective:  Patient ID: [name], female , DOB: 07-25-1945 , age 77 y.o. , MRN: [id number] , ADDRESS: [address] [place] [facility] [address]   03/01/2018 -   Chief Complaint   Patient presents with   Follow-up    f/u asthma, no wheezing since dupixent     HPI [name] 77 y.o. -  Follow-up severe eosinophilic asthma on Dupixent.  She is taking the shots in her arm every 2 weeks.  This is been going on for 8 weeks or so.  She reports excellent improvement in her asthma.  Asthma control questionnaire 0 out of 5.  Significant improvement in her exam nitric oxide today going from the mid 100s to 43.  She continues to be on Dulera and Singulair.  She is asking if she can de-escalate any of this therapy.  She is not waking up in the middle of the night with asthma.  She not having any symptoms when she wakes up.  Not limited in her activities.  When she wakes up she not short of breath.  No wheezing.  She not using albuterol for rescue.  She thinks she might be getting a cold but she is not sure.  Nitric oxide and symptom profile shows huge improvements.  However she is wondering if she is having side effects from the dupilumab.  She is reporting some blurred vision and needing to use reading glasses more so after starting the dupilumab.  This is temporally correlated.  Review of the literature shows conjunctivitis and pruritus reported at low percentages but not blurred vision.  OV 05/31/2018  Subjective:  Patient ID: [name], female , DOB: 03/10/1946 , age 77 y.o. , MRN: [id number] , ADDRESS: [address] [place] [facility] [address]   05/31/2018 -   Chief Complaint  Patient presents with   Follow-up    Pt states she has been doing okay since last visit and denies any current complaints of cough, SOB, or CP     HPI [name] 77 y.o. -presents for follow-up.  She has severe asthma with eosinophilia.  Finally she is on Dupixent/in December 2019 she had asthma exacerbation and ended up in the ER and needed prednisone but after that she is been doing well.  Asthma control question is 0.  She not waking up in the middle of the night with symptoms when she wakes  up she is asymptomatic.  No limitations in activities no albuterol rescue use.  No wheezing no shortness of breath no cough.  Asthma control question is 0.  Exam nitric oxide is further improved to 22.  However she wants to stop the Dupixent because she feels she is getting a brain fog although she states that it could be related to MS.  She also wants to start her MS medications and is worried about the effects of polypharmacy.  She also discussed because of the upcoming coronavirus 19 pandemic.           OV 10/14/2019  Subjective:  Patient ID: [name], female , DOB: 11/18/1945 , age 77 y.o. , MRN: [id number] , ADDRESS: [address] [place] [facility] [address]   10/14/2019 -   Chief Complaint  Patient presents with   Follow-up    Eosinophilic moderate to severe persistent asthma HPI [name] 77 y.o. -presents for follow-up.  I last saw her March 2020.  Then with the pandemic I did not see her.  She has been coming to the office and getting her Dupixent twice weekly.  She is on the higher dose.  She is not using any inhaled steroid.  She wants to know if she can reduce the dose of the Dupixent.  She is also on Zyrtec and Singulair.  She wants to know if she can stop this.  Asthma is completely under control and she is asymptomatic.        ROS  OV 12/14/2020  Subjective:  Patient ID: [name], female , DOB: 07-07-1945 , age 77 y.o. , MRN: [id number] , ADDRESS: [address] [place] [address] PCP Merri Brunette, MD Patient Care Team: Merri Brunette, MD as PCP - General (Internal Medicine) Lennette Bihari, MD as PCP - Cardiology (Cardiology)  This Provider for this visit: Treatment Team:  Attending Provider: Kalman Shan, MD    12/14/2020 -   Chief Complaint  Patient presents with   Follow-up    Pt states she has been doing okay since last visit. States she has had an occ dry cough which lasted about 10 days but then went away.    Eosinophilic  moderate to severe persistent asthma  HPI [name] 77 y.o. -last sseen by me July 2021 and then NP in OCt 2021. Asthma under excellent control based on ACT and feno. If she cuts grass or trimes bushes she gets cough. Has randon cough episodes occasionally. Otherwise well . Currentlyt on dupixent alone as single drug for asthma.  Discussed high dose flu shot and she will have it. Discussed covid mRNA bivalent booster -r ecommended she have it.  Never had covid before     Asthma Control Test ACT Total Score  12/14/2020 25  01/14/2020 24     Lab Results  Component Value Date   NITRICOXIDE 25 12/14/2020     OV 07/21/2021  Subjective:  Patient ID: [name], female , DOB: [birth year] , age 9 y.o. , MRN: [id number] , ADDRESS: [address] [place] [facility] [address] PCP Merri Brunette, MD Patient Care Team: Merri Brunette, MD as PCP - General (Internal Medicine) Lennette Bihari, MD as PCP - Cardiology (Cardiology)  This Provider for this visit: Treatment Team:  Attending Provider: Kalman Shan, MD    07/21/2021 -   Chief Complaint  Patient presents with   Follow-up    Pt states she has been doing good since last visit and denies any complaints. States that she has not had any recent flare ups.     HPI Robyn WIATR 77 y.o. -follows up eosinophilic asthma.  She is on Dupixent low-dose.  She is also now on Symbicort.  She says she is doing really well.  Asthma control test questionnaire is normal.  Exam nitric oxide test is normal.  She wants to stop taking Dupixent.  This worked really well for her but she feels that she is under remission.  She feels her daughter Maralyn Sago might be upset if she stopped the Dupixent.  However we discussed this.  She has been on low-dose for a year.  We think we can take an expectant approach of stopping Dupixent and see how she does.  She is open to this.  And therefore we took decision to stop Dupixent but continue  Symbicort.   ACT  Asthma Control Test ACT Total Score  07/21/2021 11:34 AM 25  12/14/2020  1:50 PM 25  01/14/2020 10:02 AM 24    Lab Results  Component Value Date   NITRICOXIDE 18 07/21/2021       OV 11/08/2021  Subjective:  Patient ID: [name], female , DOB: 10-22-1945 , age 61 y.o. , MRN: [id number] , ADDRESS: [address] [place] [facility] [address] PCP Merri Brunette, MD Patient Care Team: Merri Brunette, MD as PCP - General (Internal Medicine) Lennette Bihari, MD as PCP - Cardiology (Cardiology)  This Provider for this visit: Treatment Team:  Attending Provider: Kalman Shan, MD    11/08/2021 -   Chief Complaint  Patient presents with   Follow-up    Pt states she has been doing okay since last visit and denies any complaints.     HPI [name] 77 y.o. -returns for follow-up.  She has eosinophilic asthma.  In April 2023 after years of being on Dupixent we stopped it.  She continues to do well.  Her exhaled nitric oxide is within normal range/slightly gray zone 31.  Otherwise feeling good.  She is up-to-date with her vaccines.  She wanted to know about RSV vaccine.  She is going to think about this.  She wanted know about COVID mRNA vaccine booster.  We discussed about the current strains in the community.  We took a shared decision making to evaluate the strains in the community for COVID and then decide on the COVID mRNA booster.  She is going to wait on the literature on RSV vaccine.  She will have the high-dose flu shot.  At this point in time we took a shared decision making to monitor her off Dupixent because asthma control is very good.     OV 01/28/2022  Subjective:  Patient ID: [name], female , DOB: 12-Dec-1945 , age 82 y.o. , MRN: [id number] , ADDRESS: [address] [place] [facility] [address] PCP Merri Brunette, MD Patient Care Team: Merri Brunette, MD as PCP - General (Internal Medicine) Lennette Bihari, MD as PCP - Cardiology  (Cardiology)  This Provider for this visit: Treatment Team:  Attending Provider: Kalman Shan, MD    01/28/2022 -   Chief Complaint  Patient presents with   Follow-up    Pt states she has been doing okay since last visit and denies any complaints.     HPI [name] 77 y.o. -returns for follow-up.  Since I last saw her she had 1 episode of "pneumonia".  Apparently x-ray was done by primary care physician.  She did see Dr. Mechele Collin acutely in our office in August 2023.  And was given 7-day Levaquin and prednisone.  She followed up with nurse practitioner 12/14/2021 and the chest x-ray did not show pneumonia.  Currently doing well.  Asthma control test questionnaire shows excellent asthma control.  She is here with her daughter Maralyn Sago.  They are wondering if she should restart her Dupixent.  We discussed this in detail.  I certainly said that we could restart it now.  Alternatively we could follow closely with good close follow-up and if there is another respiratory exacerbation definitely before spring or summer 2024 then that should be low threshold definitely start Dupixent again.  We can even do low-dose Dupixent.  They are okay with this plan and will stay in touch.  We also discussed respiratory vaccines.  I recommended RSV and the COVID mRNA booster.     Asthma Control Test ACT Total Score  01/28/2022  3:23 PM 24  11/08/2021  1:53 PM 24  07/21/2021 11:34 AM 25    OV 11/04/2022  Subjective:  Patient ID: [name], female , DOB: [birth year] , age 84 y.o. , MRN: [id number] , ADDRESS: [address] [place] [facility] [address] PCP Merri Brunette, MD Patient Care Team: Merri Brunette, MD as PCP - General (Internal Medicine) Lennette Bihari, MD as PCP - Cardiology (Cardiology)  This Provider for this visit: Treatment Team:  Attending Provider: Kalman Shan, MD    11/04/2022 -   Chief Complaint  Patient presents with   Follow-up    F/up on asthma      HPI [name] 77 y.o. -presents for her asthma follow-up.  Her Gardiner Fanti continues to be range bound.  However 10/12/2022 she suffered from COVID-19 with sore throat.  And then she took Paxlovid and started getting better but by October 21, 2022 October 22, 2022 she had rebound and finally resolved all her symptoms by October 26, 2022 although she is left with a new onset laryngospasm.  She has happened 5 times.  It happened in restaurants and 1 time in the art studio when she swallows saliva.  She said she nearly called EMS.  She spoke to her son who is anesthesiologist and he wondered about acid reflux or taking empiric steroids which is also my recommendation.  She is not on fish oil which is at risk factor for this.  Otherwise is doing well.       Ref. Range 10/07/2016 12:29 05/10/2017  06/08/2017 Start nucala 08/08/2017  10/16/2017   11/30/2017  03/01/2018 On dupixemen 05/31/2018  01/14/20  Stopped singulair in July, reduced dupixent doe july 12/14/2020 Using ACT from no won 11/08/2021 OFF DUPIXENT since April 2023  01/28/2022  11/04/2022 Covid a fdw week ago Off dupixent  Nitric Oxide Unknown 180 153 156 162 237 158 43 22 32 25  31 x 29  ACQ score through march 2020 and then ACTfrom oct 2021   3.6   2.2  1.2 1.8 0 0 24  24 24 18     PFT     Latest Ref Rng & Units 11/29/2016   10:07 AM  ILD indicators  FVC-Pre L 2.62   FVC-Predicted Pre % 90   FVC-Post L 2.50   FVC-Predicted Post % 86   TLC L 5.05   TLC Predicted % 103   DLCO uncorrected ml/min/mmHg 20.03   DLCO UNC %Pred % 87   DLCO Corrected ml/min/mmHg 19.74   DLCO COR %Pred % 86       LAB RESULTS last 96 hours No results found.  LAB RESULTS last 90 days Recent Results (from the past 2160 hour(s))  Urinalysis, Routine w reflex microscopic -Urine, Clean Catch     Status: Abnormal   Collection Time: 09/11/22  9:46 PM  Result Value Ref Range   Color, Urine COLORLESS (A) YELLOW   APPearance CLEAR CLEAR   Specific Gravity, Urine  <1.005 (L) 1.005 - 1.030   pH 5.0 5.0 - 8.0   Glucose, UA NEGATIVE NEGATIVE mg/dL   Hgb urine dipstick MODERATE (A) NEGATIVE   Bilirubin Urine NEGATIVE NEGATIVE   Ketones, ur NEGATIVE NEGATIVE mg/dL   Protein, ur NEGATIVE NEGATIVE mg/dL   Nitrite NEGATIVE NEGATIVE   Leukocytes,Ua SMALL (A) NEGATIVE   RBC / HPF 0-5 0 - 5 RBC/hpf   WBC, UA 11-20 0 - 5 WBC/hpf   Bacteria, UA NONE SEEN NONE SEEN   Squamous Epithelial / HPF 0-5 0 - 5 /HPF    Comment: Performed at [facility], [address], [place], [place] [address]  Urine Culture     Status: Abnormal   Collection Time: 09/11/22 10:39 PM   Specimen: Urine, Clean Catch  Result Value Ref Range   Specimen Description      URINE, CLEAN CATCH Performed at [facility], [address] [place], [place] [address]    Special Requests      NONE Performed at [facility], [address], [place], [place] [address]    Culture MULTIPLE SPECIES PRESENT, SUGGEST RECOLLECTION (A)    Report Status 09/13/2022 FINAL   Comprehensive metabolic panel     Status: Abnormal   Collection Time: 10/12/22  8:02 PM  Result Value Ref Range   Sodium 138 135 - 145 mmol/L   Potassium 4.0 3.5 - 5.1 mmol/L   Chloride 105 98 - 111 mmol/L   CO2 25 22 - 32 mmol/L   Glucose, Bld 107 (H) 70 - 99 mg/dL    Comment: Glucose reference range applies only to samples taken after fasting for at least 8 hours.   BUN 14 8 - 23 mg/dL   Creatinine, Ser 9.14 0.44 - 1.00 mg/dL   Calcium 9.1 8.9 - 78.2 mg/dL   Total Protein 6.7 6.5 - 8.1 g/dL   Albumin 4.5 3.5 - 5.0 g/dL   AST 15 15 - 41 U/L   ALT 15 0 - 44 U/L   Alkaline Phosphatase 49 38 - 126 U/L   Total Bilirubin 0.4 0.3 - 1.2 mg/dL   GFR, Estimated >95 >62 mL/min    Comment: (NOTE) Calculated using the CKD-EPI Creatinine Equation (2021)    Anion gap 8 5 - 15  Comment: Performed at [facility], [address] [place], [place] [address]   POCT EXHALED NITRIC OXIDE     Status: Normal   Collection Time: 11/04/22 10:41 AM  Result Value Ref Range   FeNO level (ppb) 29          has a past medical history of Anemia, Anginal pain (HCC), Arthritis, Asthma, Complication of anesthesia, Coronary artery disease, Dry eyes, Esophageal spasm, GERD (gastroesophageal reflux disease), and MS (multiple sclerosis) (HCC).   reports that she has never smoked. She has never used smokeless tobacco.  Past Surgical History:  Procedure Laterality Date   ABDOMINAL HYSTERECTOMY     BLEPHAROPLASTY Bilateral    CARDIAC CATHETERIZATION  10/04/06   MINOR CAD,SINGLE VESSEL INVOLVING THE CIRCUMFLEX. 20 TO 30% PROXIMALLY AND 10 TO 20% IN THE MIDDLE SEGMENT.MILD MUSCLE BRIDGING, MID LAD.NORMAL RCA.NORMAL LV FUNCTION.NORMAL MITRAL AND AORTIC VALVE.NORMAL APPEARING AORTA,THORACIC AND ABDOMINAL.NORMAL RENAL ARTERIES.   CARDIOLOGY NUCLEAR MED STUDY  06/22/12   NL LV FUNCTION,EF 68%,NL WALL MOTION.   CAROTID DUPLEX  07/02/11   XLK:GMWN SOFT PLAQUE NOTED DISTAL CCA AND ORGIN AND PROXIMAL ICA,LEFT>RIGHT.NO ICA STENOSIS. VERTEBRAL ARTERY FLOW IS ANTEGRADE.   CESAREAN SECTION     x2    COLONOSCOPY WITH PROPOFOL N/A 12/11/2014   Procedure: COLONOSCOPY WITH PROPOFOL;  Surgeon: Bernette Redbird, MD;  Location: [facility];  Service: Endoscopy;  Laterality: N/A;   KNEE ARTHROSCOPY Left    scope   PARATHYROIDECTOMY     partial-many years ago   thumb surgery Bilateral    built up and bone removal   TONSILLECTOMY     TRANSTHORACIC ECHOCARDIOGRAM  07/02/11   LV CAVITY SIZE IS NORMAL. SYSTOLIC FUNCTION WAS NORMAL.EF=55% TO 60%.INCREASED RELATIVE CONTRIBUTION OF ATRIAL CONTRACTION TO VENTRICULAR FILLING;MAYBE DUE TO HYPOVOLEMIA. AV=MILD REGURG.    Allergies  Allergen Reactions   Crestor [Rosuvastatin] Other (See Comments)    Joint pain    Iodinated Contrast Media Other (See Comments)    Sneezing and itchy throat; dye was Isovue 300   Vicodin  [Hydrocodone-Acetaminophen]     Made pass-out one time and is okay taking now   Isovue [Iopamidol]     Pt had sneezing and itching of her throat and soft palate.  Dr Gery Pray checked pt.  She will need premeds in the future.  J Bohm    Immunization History  Administered Date(s) Administered   Fluad Quad(high Dose 65+) 11/27/2018, 12/19/2019, 12/14/2020, 12/14/2021   Influenza, High Dose Seasonal PF 12/22/2013, 12/14/2015, 12/14/2016, 11/30/2017   Influenza,inj,Quad PF,6+ Mos 11/27/2014   PFIZER(Purple Top)SARS-COV-2 Vaccination 04/16/2019, 05/06/2019, 08/06/2020   Pneumococcal Polysaccharide-23 10/26/2013   Pneumococcal-Unspecified 10/26/2013   Tdap 06/14/2011   Zoster Recombinant(Shingrix) 11/13/2017, 02/08/2018    Family History  Problem Relation Age of Onset   Heart disease Mother    Asthma Father    Bladder Cancer Father    Heart failure Father    Hyperlipidemia Brother      Current Outpatient Medications:    albuterol (VENTOLIN HFA) 108 (90 Base) MCG/ACT inhaler, Inhale 2 puffs into the lungs every 6 (six) hours as needed for wheezing or shortness of breath., Disp: 8 g, Rfl: 2   azithromycin (ZITHROMAX) 250 MG tablet, Take 1 tablet (250 mg total) by mouth as directed., Disp: 6 tablet, Rfl: 0   baclofen (LIORESAL) 10 MG tablet, Take 1/2 to 1 pill every night and 1 pill po qd prn spasticity, Disp: 180 each, Rfl: 4   Calcium Acetate, Phos Binder, (CALCIUM ACETATE PO),  Take by mouth., Disp: , Rfl:    Cholecalciferol (VITAMIN D PO), Take 1 tablet by mouth daily., Disp: , Rfl:    denosumab (PROLIA) 60 MG/ML SOSY injection, 60 mg, Disp: , Rfl:    diltiazem (CARDIZEM CD) 240 MG 24 hr capsule, TAKE ONE CAPSULE BY MOUTH DAILY, Disp: 90 capsule, Rfl: 1   EPINEPHRINE 0.3 mg/0.3 mL IJ SOAJ injection, USE AS DIRECTED, Disp: 2 each, Rfl: 3   Evolocumab (REPATHA) 140 MG/ML SOSY, Inject into the skin., Disp: , Rfl:    ipratropium-albuterol (DUONEB) 0.5-2.5 (3) MG/3ML SOLN, Take 3 mLs by  nebulization 2 (two) times daily. (Patient taking differently: Take 3 mLs by nebulization 2 (two) times daily. Pt takes as needed), Disp: 360 mL, Rfl: 5   isosorbide mononitrate (IMDUR) 60 MG 24 hr tablet, TAKE 1 & 1/2 TABLETS ONCE DAILY, Disp: 135 tablet, Rfl: 3   lamoTRIgine (LAMICTAL) 25 MG tablet, For 5 days take one pill a day then take one pill po bid, Disp: 60 tablet, Rfl: 11   levofloxacin (LEVAQUIN) 500 MG tablet, Take 1 tablet (500 mg total) by mouth daily., Disp: 7 tablet, Rfl: 0   losartan (COZAAR) 50 MG tablet, TAKE 1 & 1/2 TABLETS ONCE DAILY, Disp: 90 tablet, Rfl: 3   Multiple Vitamin (MULTIVITAMIN ADULT PO), Take by mouth., Disp: , Rfl:    nitroGLYCERIN (NITROSTAT) 0.4 MG SL tablet, PLACE 1 TABLET UNDER THE TONGUE AS NEEDED FOR CHEST PAIN, MAY REPEAT EVERY 5 MINUTES., Disp: 25 tablet, Rfl: 3   pantoprazole (PROTONIX) 20 MG tablet, TAKE ONE TABLET BY MOUTH TWICE DAILY, Disp: 60 tablet, Rfl: 1   PARoxetine (PAXIL) 10 MG tablet, Take 5 mg by mouth daily. , Disp: , Rfl:    Respiratory Therapy Supplies (FLUTTER) DEVI, Use as directed, Disp: 1 each, Rfl: 0   SYMBICORT 80-4.5 MCG/ACT inhaler, INHALE TWO PUFFS INTO THE LUNGS EVERY MORNING AND AT BEDTIME, Disp: 10.2 g, Rfl: 12      Objective:   Vitals:   11/04/22 1030  BP: 130/70  Pulse: 68  SpO2: 93%  Weight: 146 lb 9.6 oz (66.5 kg)  Height: 5\' 2"  (1.575 m)    Estimated body mass index is 26.81 kg/m as calculated from the following:   Height as of this encounter: 5\' 2"  (1.575 m).   Weight as of this encounter: 146 lb 9.6 oz (66.5 kg).  @WEIGHTCHANGE @  [employer]   11/04/22 1030  Weight: 146 lb 9.6 oz (66.5 kg)     Physical Exam   General: No distress. Looks well O2 at rest: no Cane present: no Sitting in wheel chair: no Frail: no Obese: no Neuro: Alert and Oriented x 3. GCS 15. Speech normal Psych: Pleasant Resp:  Barrel Chest - no.  Wheeze - no, Crackles - no, No overt respiratory distress CVS: Normal heart  sounds. Murmurs - no Ext: Stigmata of Connective Tissue Disease - no HEENT: Normal upper airway. PEERL +. No post nasal drip        Assessment:       ICD-10-CM   1. Moderate persistent asthma, unspecified whether complicated  J45.40     2. Eosinophilic asthma  H84.69 POCT EXHALED NITRIC OXIDE    3. History of 2019 novel coronavirus disease (COVID-19)  Z86.16     4. Laryngospasm  J38.5          Plan:     Patient Instructions     ICD-10-CM   1. Moderate persistent asthma, unspecified whether complicated  J45.40     2. Eosinophilic asthma  W11.91 POCT EXHALED NITRIC OXIDE    3. History of 2019 novel coronavirus disease (COVID-19)  Z86.16     4. Laryngospasm  J38.5       -Off Dupixent since April 2023 and since then 1 episode of flareup in August 2023 and then COVID in July 2024.  Currently in August 2024 post COVID you are having some laryngospasm  Plan -We will try empiric prednisone for the laryngospasm Please take prednisone 40 mg x1 day, then 30 mg x1 day, then 20 mg x1 day, then 10 mg x1 day, and then 5 mg x1 day and stop Make sure taking your Protonix as before Chew well and eat slowly and swallow slowly Sleep with head end of the bed elevated or a couple of pillows   -continue symbicort as before -Use albuterol as needed  - ok to monitor without dupixent at this moment  Follow-up -If laryngospasms continues to be a problem please let us know and we might refer you to neurology or to ENT -6 months do exhaled nitric oxide testing  = Return to see Dr Marchelle Gearing in 6 months or sooner if needed  - ACT/FEno at follow-up   FOLLOWUP Return in about 6 months (around 05/07/2023) for 15 min visit, Asthma, with Dr Marchelle Gearing, Face to Face Visit.    SIGNATURE    Dr. Kalman Shan, M.D., F.C.C.P,  Pulmonary and Critical Care Medicine Staff Physician, [facility] Center Director - Interstitial Lung Disease  Program  Pulmonary Fibrosis [facility] at [facility], [place], [address]  Pager: [phone], If no answer or between  15:00h - 7:00h: call [phone] Telephone: [phone]  11:04 AM 11/04/2022

## 2022-11-04 ENCOUNTER — Ambulatory Visit: Payer: Medicare PPO | Admitting: Internal Medicine

## 2022-11-04 ENCOUNTER — Encounter: Payer: Self-pay | Admitting: Internal Medicine

## 2022-11-04 VITALS — BP 130/70 | HR 68 | Ht 62.0 in | Wt 146.6 lb

## 2022-11-04 DIAGNOSIS — J4540 Moderate persistent asthma, uncomplicated: Secondary | ICD-10-CM

## 2022-11-04 DIAGNOSIS — J385 Laryngeal spasm: Secondary | ICD-10-CM

## 2022-11-04 DIAGNOSIS — J8283 Eosinophilic asthma: Secondary | ICD-10-CM | POA: Diagnosis not present

## 2022-11-04 DIAGNOSIS — Z8616 Personal history of COVID-19: Secondary | ICD-10-CM | POA: Diagnosis not present

## 2022-11-04 LAB — POCT EXHALED NITRIC OXIDE: FeNO level (ppb): 29

## 2022-11-04 MED ORDER — PREDNISONE 10 MG PO TABS: ORAL_TABLET | ORAL | 0 refills | Status: AC

## 2022-11-04 NOTE — Addendum Note (Signed)
Addended by: Hedda Slade on: 11/04/2022 11:25 AM   Modules accepted: Orders

## 2022-11-07 DIAGNOSIS — H903 Sensorineural hearing loss, bilateral: Secondary | ICD-10-CM | POA: Diagnosis not present

## 2022-12-07 DIAGNOSIS — Z6826 Body mass index (BMI) 26.0-26.9, adult: Secondary | ICD-10-CM | POA: Diagnosis not present

## 2022-12-07 DIAGNOSIS — Z1231 Encounter for screening mammogram for malignant neoplasm of breast: Secondary | ICD-10-CM | POA: Diagnosis not present

## 2022-12-07 DIAGNOSIS — Z01419 Encounter for gynecological examination (general) (routine) without abnormal findings: Secondary | ICD-10-CM | POA: Diagnosis not present

## 2022-12-12 ENCOUNTER — Other Ambulatory Visit: Payer: Self-pay | Admitting: Internal Medicine

## 2022-12-12 ENCOUNTER — Ambulatory Visit (INDEPENDENT_AMBULATORY_CARE_PROVIDER_SITE_OTHER): Payer: Medicare PPO

## 2022-12-12 VITALS — BP 135/77 | HR 61 | Temp 97.3°F | Resp 16 | Ht 62.5 in | Wt 146.4 lb

## 2022-12-12 DIAGNOSIS — M810 Age-related osteoporosis without current pathological fracture: Secondary | ICD-10-CM

## 2022-12-12 MED ORDER — DENOSUMAB 60 MG/ML ~~LOC~~ SOSY
60.0000 mg | PREFILLED_SYRINGE | Freq: Once | SUBCUTANEOUS | Status: AC
  Administered 2022-12-12: 60 mg via SUBCUTANEOUS
  Filled 2022-12-12: qty 1

## 2022-12-12 NOTE — Progress Notes (Signed)
Diagnosis: Osteoporosis  Provider:  Chilton Greathouse MD  Procedure: Injection  Prolia (Denosumab), Dose: 60 mg, Site: subcutaneous, Number of injections: 1  Administered in left arm.  Post Care: Patient declined observation  Discharge: Condition: Stable, Destination: Home . AVS Declined  Performed by:  Wyvonne Lenz, RN

## 2022-12-14 DIAGNOSIS — E559 Vitamin D deficiency, unspecified: Secondary | ICD-10-CM | POA: Diagnosis not present

## 2022-12-14 DIAGNOSIS — I10 Essential (primary) hypertension: Secondary | ICD-10-CM | POA: Diagnosis not present

## 2022-12-14 DIAGNOSIS — E039 Hypothyroidism, unspecified: Secondary | ICD-10-CM | POA: Diagnosis not present

## 2022-12-14 DIAGNOSIS — M810 Age-related osteoporosis without current pathological fracture: Secondary | ICD-10-CM | POA: Diagnosis not present

## 2023-01-06 DIAGNOSIS — H00021 Hordeolum internum right upper eyelid: Secondary | ICD-10-CM | POA: Diagnosis not present

## 2023-01-06 DIAGNOSIS — H0288B Meibomian gland dysfunction left eye, upper and lower eyelids: Secondary | ICD-10-CM | POA: Diagnosis not present

## 2023-01-06 DIAGNOSIS — Z961 Presence of intraocular lens: Secondary | ICD-10-CM | POA: Diagnosis not present

## 2023-01-06 DIAGNOSIS — H0102B Squamous blepharitis left eye, upper and lower eyelids: Secondary | ICD-10-CM | POA: Diagnosis not present

## 2023-01-06 DIAGNOSIS — H0102A Squamous blepharitis right eye, upper and lower eyelids: Secondary | ICD-10-CM | POA: Diagnosis not present

## 2023-01-06 DIAGNOSIS — H0288A Meibomian gland dysfunction right eye, upper and lower eyelids: Secondary | ICD-10-CM | POA: Diagnosis not present

## 2023-01-16 DIAGNOSIS — H26491 Other secondary cataract, right eye: Secondary | ICD-10-CM | POA: Diagnosis not present

## 2023-01-16 DIAGNOSIS — Z961 Presence of intraocular lens: Secondary | ICD-10-CM | POA: Diagnosis not present

## 2023-01-16 DIAGNOSIS — H0102B Squamous blepharitis left eye, upper and lower eyelids: Secondary | ICD-10-CM | POA: Diagnosis not present

## 2023-01-16 DIAGNOSIS — H00021 Hordeolum internum right upper eyelid: Secondary | ICD-10-CM | POA: Diagnosis not present

## 2023-01-16 DIAGNOSIS — H0102A Squamous blepharitis right eye, upper and lower eyelids: Secondary | ICD-10-CM | POA: Diagnosis not present

## 2023-01-16 DIAGNOSIS — H0288B Meibomian gland dysfunction left eye, upper and lower eyelids: Secondary | ICD-10-CM | POA: Diagnosis not present

## 2023-01-16 DIAGNOSIS — H0288A Meibomian gland dysfunction right eye, upper and lower eyelids: Secondary | ICD-10-CM | POA: Diagnosis not present

## 2023-02-05 ENCOUNTER — Telehealth: Payer: Self-pay | Admitting: Internal Medicine

## 2023-02-05 NOTE — Telephone Encounter (Signed)
She texted me saying cough,chest congestion Ok to wait till 02/06/23   Plan  - Z PAK - - Please take prednisone 40 mg x1 day, then 30 mg x1 day, then 20 mg x1 day, then 10 mg x1 day, and then 5 mg x1 day and stop  SEND TO [facility] on 02/06/23

## 2023-02-06 ENCOUNTER — Other Ambulatory Visit: Payer: Self-pay

## 2023-02-06 MED ORDER — AZITHROMYCIN 250 MG PO TABS: ORAL_TABLET | ORAL | 0 refills | Status: DC

## 2023-02-06 MED ORDER — AZITHROMYCIN 250 MG PO TABS: ORAL_TABLET | ORAL | 0 refills | Status: AC

## 2023-02-06 MED ORDER — PREDNISONE 10 MG PO TABS: ORAL_TABLET | ORAL | 0 refills | Status: DC

## 2023-02-06 NOTE — Telephone Encounter (Signed)
Zpak and prednisone has been sent to preferred pharmacy. Patient is aware and voiced her understanding.  Nothing further needed.

## 2023-02-13 DIAGNOSIS — R159 Full incontinence of feces: Secondary | ICD-10-CM | POA: Diagnosis not present

## 2023-02-13 DIAGNOSIS — Z860101 Personal history of adenomatous and serrated colon polyps: Secondary | ICD-10-CM | POA: Diagnosis not present

## 2023-02-28 DIAGNOSIS — K5730 Diverticulosis of large intestine without perforation or abscess without bleeding: Secondary | ICD-10-CM | POA: Diagnosis not present

## 2023-02-28 DIAGNOSIS — Z09 Encounter for follow-up examination after completed treatment for conditions other than malignant neoplasm: Secondary | ICD-10-CM | POA: Diagnosis not present

## 2023-02-28 DIAGNOSIS — Z860101 Personal history of adenomatous and serrated colon polyps: Secondary | ICD-10-CM | POA: Diagnosis not present

## 2023-02-28 DIAGNOSIS — D122 Benign neoplasm of ascending colon: Secondary | ICD-10-CM | POA: Diagnosis not present

## 2023-03-02 DIAGNOSIS — D122 Benign neoplasm of ascending colon: Secondary | ICD-10-CM | POA: Diagnosis not present

## 2023-03-06 DIAGNOSIS — R159 Full incontinence of feces: Secondary | ICD-10-CM | POA: Diagnosis not present

## 2023-03-06 DIAGNOSIS — N3946 Mixed incontinence: Secondary | ICD-10-CM | POA: Diagnosis not present

## 2023-03-06 DIAGNOSIS — K5900 Constipation, unspecified: Secondary | ICD-10-CM | POA: Diagnosis not present

## 2023-03-06 DIAGNOSIS — M6281 Muscle weakness (generalized): Secondary | ICD-10-CM | POA: Diagnosis not present

## 2023-03-06 DIAGNOSIS — R351 Nocturia: Secondary | ICD-10-CM | POA: Diagnosis not present

## 2023-03-09 ENCOUNTER — Ambulatory Visit: Payer: Medicare PPO | Admitting: Neurology

## 2023-03-13 ENCOUNTER — Other Ambulatory Visit: Payer: Self-pay | Admitting: Cardiovascular Disease

## 2023-04-05 DIAGNOSIS — R350 Frequency of micturition: Secondary | ICD-10-CM | POA: Diagnosis not present

## 2023-04-05 DIAGNOSIS — M7062 Trochanteric bursitis, left hip: Secondary | ICD-10-CM | POA: Diagnosis not present

## 2023-04-05 DIAGNOSIS — R159 Full incontinence of feces: Secondary | ICD-10-CM | POA: Diagnosis not present

## 2023-04-05 DIAGNOSIS — M6281 Muscle weakness (generalized): Secondary | ICD-10-CM | POA: Diagnosis not present

## 2023-04-05 DIAGNOSIS — N3946 Mixed incontinence: Secondary | ICD-10-CM | POA: Diagnosis not present

## 2023-04-05 DIAGNOSIS — K5900 Constipation, unspecified: Secondary | ICD-10-CM | POA: Diagnosis not present

## 2023-04-05 DIAGNOSIS — M7061 Trochanteric bursitis, right hip: Secondary | ICD-10-CM | POA: Diagnosis not present

## 2023-04-13 DIAGNOSIS — B370 Candidal stomatitis: Secondary | ICD-10-CM | POA: Diagnosis not present

## 2023-04-26 ENCOUNTER — Telehealth: Payer: Self-pay | Admitting: Pharmacy Technician

## 2023-04-26 NOTE — Telephone Encounter (Signed)
Auth Submission: APPROVED Site of care: Site of care: [facility] Payer: [employer] MEDICARE Medication & CPT/J Code(s) submitted: Prolia (Denosumab) [id number] Route of submission (phone, fax, portal):  Phone # Fax # Auth type: Buy/Bill PB Units/visits requested: X2 DOSES Reference number: [id number] Approval from: 11/16/19 to 03/27/24

## 2023-04-27 DIAGNOSIS — R350 Frequency of micturition: Secondary | ICD-10-CM | POA: Diagnosis not present

## 2023-04-27 DIAGNOSIS — M6281 Muscle weakness (generalized): Secondary | ICD-10-CM | POA: Diagnosis not present

## 2023-04-27 DIAGNOSIS — N3946 Mixed incontinence: Secondary | ICD-10-CM | POA: Diagnosis not present

## 2023-05-05 ENCOUNTER — Ambulatory Visit: Payer: Medicare PPO | Admitting: Cardiovascular Disease

## 2023-05-11 ENCOUNTER — Ambulatory Visit: Payer: Medicare PPO | Attending: Cardiovascular Disease | Admitting: Cardiovascular Disease

## 2023-05-11 ENCOUNTER — Encounter: Payer: Self-pay | Admitting: Cardiovascular Disease

## 2023-05-11 VITALS — BP 130/74 | HR 69 | Ht 62.5 in | Wt 146.6 lb

## 2023-05-11 DIAGNOSIS — I10 Essential (primary) hypertension: Secondary | ICD-10-CM | POA: Diagnosis not present

## 2023-05-11 DIAGNOSIS — I358 Other nonrheumatic aortic valve disorders: Secondary | ICD-10-CM

## 2023-05-11 DIAGNOSIS — G35 Multiple sclerosis: Secondary | ICD-10-CM

## 2023-05-11 DIAGNOSIS — R0789 Other chest pain: Secondary | ICD-10-CM | POA: Diagnosis not present

## 2023-05-11 DIAGNOSIS — R002 Palpitations: Secondary | ICD-10-CM

## 2023-05-11 DIAGNOSIS — I2510 Atherosclerotic heart disease of native coronary artery without angina pectoris: Secondary | ICD-10-CM | POA: Diagnosis not present

## 2023-05-11 DIAGNOSIS — J4530 Mild persistent asthma, uncomplicated: Secondary | ICD-10-CM

## 2023-05-11 DIAGNOSIS — I350 Nonrheumatic aortic (valve) stenosis: Secondary | ICD-10-CM | POA: Diagnosis not present

## 2023-05-11 NOTE — Patient Instructions (Addendum)
Medication Instructions:  No medication changes were made during today's visit  *If you need a refill on your cardiac medications before your next appointment, please call your pharmacy*   Lab Work: No labs were ordered during today's visit.  If you have labs (blood work) drawn today and your tests are completely normal, you will receive your results only by: MyChart Message (if you have MyChart) OR A paper copy in the mail If you have any lab test that is abnormal or we need to change your treatment, we will call you to review the results.   Testing/Procedures: Your physician has requested that you have an echocardiogram. Echocardiography is a painless test that uses sound waves to create images of your heart. It provides your doctor with information about the size and shape of your heart and how well your heart's chambers and valves are working. This procedure takes approximately one hour. There are no restrictions for this procedure.   Please do NOT wear cologne, perfume, aftershave, or lotions (deodorant is allowed). Please arrive 15 minutes prior to your appointment time.  Please note: We ask at that you not bring children with you during ultrasound (echo/ vascular) testing. Due to room size and safety concerns, children are not allowed in the ultrasound rooms during exams. Our front office staff cannot provide observation of children in our lobby area while testing is being conducted. An adult accompanying a patient to their appointment will only be allowed in the ultrasound room at the discretion of the ultrasound technician under special circumstances. We apologize for any inconvenience.  .CT   Your cardiac CT will be scheduled at one of the below locations:   [facility] [address] [place], [place] [address] [phone]  OR  [facility] [address] [place], [place] [address] [phone]  OR   [facility] [address] [place], [place] [address] [phone]  OR   [facility] [address] [place], [place] [address] [phone]  If scheduled at [facility], please arrive at the [facility] and Children's Entrance (Entrance C2) of [facility] 30 minutes prior to test start time. You can use the FREE valet parking offered at entrance C (encouraged to control the heart rate for the test)  Proceed to the [facility] Radiology Department (first floor) to check-in and test prep.  All radiology patients and guests should use entrance C2 at [facility], accessed from [facility], even though the hospital's physical address listed is [address]    If scheduled at [facility] or [facility], please arrive 15 mins early for check-in and test prep.  There is spacious parking and easy access to the radiology department from the [facility] Heart [facility] Vascular entrance. Please enter here and check-in with the desk attendant.   If scheduled at [facility], please arrive 30 minutes early for check-in and test prep.  Please follow these instructions carefully (unless otherwise directed):  An IV will be required for this test and Nitroglycerin will be given.  Hold all erectile dysfunction medications at least 3 days (72 hrs) prior to test. (Ie viagra, cialis, sildenafil, tadalafil, etc)   On the Night Before the Test: Be sure to Drink plenty of water. Do not consume any caffeinated/decaffeinated beverages or chocolate 12 hours prior to your test. Do not take any antihistamines 12 hours prior to  your test. If the patient has contrast allergy: Patient will need a prescription for Prednisone and very clear instructions (as follows): Prednisone 50 mg - take 13 hours prior to test Take another Prednisone 50 mg 7 hours prior to test Take another Prednisone 50 mg 1  hour prior to test Take Benadryl 50 mg 1 hour prior to test Patient must complete all four doses of above prophylactic medications. Patient will need a ride after test due to Benadryl.  On the Day of the Test: Drink plenty of water until 1 hour prior to the test. Do not eat any food 1 hour prior to test. You may take your regular medications prior to the test.  Take metoprolol (Lopressor) two hours prior to test. If you take Furosemide/Hydrochlorothiazide/Spironolactone/Chlorthalidone, please HOLD on the morning of the test. Patients who wear a continuous glucose monitor MUST remove the device prior to scanning. FEMALES- please wear underwire-free bra if available, avoid dresses & tight clothing      After the Test: Drink plenty of water. After receiving IV contrast, you may experience a mild flushed feeling. This is normal. On occasion, you may experience a mild rash up to 24 hours after the test. This is not dangerous. If this occurs, you can take Benadryl 25 mg, Zyrtec, Claritin, or Allegra and increase your fluid intake. (Patients taking Tikosyn should avoid Benadryl, and may take Zyrtec, Claritin, or Allegra) If you experience trouble breathing, this can be serious. If it is severe call 911 IMMEDIATELY. If it is mild, please call our office.  We will call to schedule your test 2-4 weeks out understanding that some insurance companies will need an authorization prior to the service being performed.   For more information and frequently asked questions, please visit our website : [email or web address]  For non-scheduling related questions, please contact the cardiac imaging nurse navigator should you have any questions/concerns: Cardiac Imaging Nurse Navigators Direct Office Dial: [phone]   For scheduling needs, including cancellations and rescheduling, please call [place], [phone].    Follow-Up: At [facility], you and your health needs are our  priority.  As part of our continuing mission to provide you with exceptional heart care, we have created designated Provider Care Teams.  These Care Teams include your primary Cardiologist (physician) and Advanced Practice Providers (APPs -  Physician Assistants and Nurse Practitioners) who all work together to provide you with the care you need, when you need it.  We recommend signing up for the patient portal called "MyChart".  Sign up information is provided on this After Visit Summary.  MyChart is used to connect with patients for Virtual Visits (Telemedicine).  Patients are able to view lab/test results, encounter notes, upcoming appointments, etc.  Non-urgent messages can be sent to your provider as well.   To learn more about what you can do with MyChart, go to [email or web address].    Your next appointment:   2-3 month(s)  Provider:   Nicki Guadalajara, MD     Other Instructions If you have any questions or concerns regarding your c-pap, bi-pap or sleep accessories, please contact Brandie Rorie at [phone].

## 2023-05-11 NOTE — Progress Notes (Signed)
Cardiology Office Note    Date:  05/13/2023   ID:  Robyn, [name] Jul 28, 1945, MRN [id number]  PCP:  Robyn Brunette, MD  Cardiologist:  Robyn Guadalajara, MD    History of Present Illness:  [name] is a 78 y.o. female who presents for a 67-month follow-up evaluation.  [name] has documented mild CAD by  cardiac catheterization in July 2008 by Dr. Charolette Child.  She had mild narrowing of 20-30% in the proximal and 10-20% in the mid left circumflex vessel.  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  She has been on medical therapy.  She also has a history of hypertension,.  Her last stress test was done in March 2014 were she had nonspecific T changes and nondiagnostic 0.5-1 mm inferolateral ST segment changes with stress.  Scintigraphic images revealed normal perfusion and function.   She has a history of multiple sclerosis and is followed by Dr. Harriette [name] in [place].  She has a history of hyperlipidemia for which she takes Vytorin 10/20 and GERD for which he takes over-the-counter Prilosec.   She has experienced recent episodes of chest pain which have been occurring almost weekly. She experiences squeezing in her arms and jaw.  She denies chest pressure.  The symptoms are not associated with activity and typically resolve on her own.  She has taken nitroglycerin with questionable benefit.  She also notes calf discomfort at night while sleeping.  She denies restless legs.   She was started on a human monoclonal antibody ocrelizumab  for her multiple sclerosis and admits to improvement in symptomatology.  When I saw her  she had experienced recurrent episodes of chest pain with some atypical features.  She underwent a nuclear perfusion study in December 2016 which revealed normal perfusion with an ejection fraction of 66%.  She developed recurrent chest pain in January and again was felt to be stable cardiovascularly.  She subsequently was evaluated at  [facility] and was felt that her chest discomfort was due to esophageal lower esophageal sphincter spasm and inability to relax appropriately.  Her symptoms have improved with Protonix and she is now being weaned off Protonix and has been started on Zantac.   She has been evaluated on several occasions by [name], [facility] with his most recent evaluation in March 2019.  He had seen her in December 2018 with chest pain more consistent with esophageal spasm.  An echocardiogram in December 2018 showed an EF of 60 to 65% with grade 1 diastolic dysfunction, mild aortic stenosis with trivial AR.  She has had issues with asthma.  She also had experienced some palpitations and a 24-hour monitor revealed short bursts of SVT, PACs and PVCs. Palpitations have improved though she still experiences some palpitations at night while falling asleep.  She eats chocolate on a daily basis.  Her sleep is very poor.  She has frequent awakenings. She snores.  She has nocturia at least 3 times per night.  He denies any chest pain.    I saw her in September 2019 with her palpitations I recommended discontinuance of amlodipine and instituted Cardizem CD 240 mg.  We discussed avoidance of chocolate which contains caffeine.  Due to concerns for sleep apnea I also recommended she undergo a sleep evaluation.  She has also seen pulmonary since her last evaluation with complaints of wheezing and on March 25, 2018 was evaluated in the emergency room with  dizziness.  She had fallen over a dog gate and struck her head.  A head CT did not reveal any acute intracranial abnormality.  There was mild age-appropriate cortical atrophy with moderate to severe chronic microvascular ischemic changes of the white matter.  She is scheduled to undergo a sleep study on January 27.  She continues to experience occasional palpitations at night.  She recently was started on a prednisone taper due to her wheezing has had some chest congestion.   She underwent a  sleep study on April 23, 2018.She was not found to have significant sleep apnea and her overall AHI was 1.1/h.  However,there was mildly sleep apnea during REM sleep with an AHI of 7.0/h.  There was evidence for soft snoring and her oxygen nadir was 88%.   I evaluated her in a telemedicine visit on Aug 01, 2018.  At that time, she was no longer  taking her multiple sclerosis infusion, due to exacerbation of asthmatic inflammation.  She is now on dupixant and is followed by Dr. Marchelle [name].  She does have issues at times where she feels like she just cannot get it very deep breath.  She denies chest pain.  She denies palpitations.  She continues to see her doctor in [place] for her multiple sclerosis.  Since her last evaluation with me, she has been evaluated by Robyn Course, PA in July 2021.  She had undergone a repeat Myoview study on March 28, 2019 which showed an EF of 66% and no evidence for prior infarct or ischemia and was interpreted as low risk.  When I last saw her she was experiencing some atypical chest discomfort occurring approximately 2-3 times per month.  He had recommended titration of isosorbide to 90 mg to help with potential esophageal spasm.  She had a pulmonary evaluation with Robyn Dura, NP on January 14, 2020.  Her asthma was felt to be well controlled..  It was recommended that she stop Zyrtec in addition to montelukast and Singulair and reduce to present to 200 mg every 2 weeks.  Follow-up in 3 months was recommended to do an exhaled nitric oxide testing.  I saw her in November 2021 at which time she denied any chest pain worrisome for angina.    At times she notes a sharp twinge of discomfort.  She apparently is no longer taking amlodipine and is on diltiazem 240 mg daily, losartan 50 mg daily for hypertension and continues to be on isosorbide mononitrate 90 mg.  She is on Vytorin 10/20 for hyperlipidemia.  She was on a Dupixent injection every 14 days.  She continued to be  on Protonix 20 mg twice a day.    I saw her on September 03 2020. She had labs yesterday by Dr. Harriette [name] in [place].  She has had issues with recent bursitis.  She has remained active and goes to the gym 2 times per week and rides a bicycle.  She tells me she had developed red spots and there was concern for possible [facility] spotted fever for which she was treated with antibiotics but her test was negative.  She has not had any anginal symptoms.    I last saw her in April 27, 2022 at which time she continued to feel well.  She was going to the gym at least 2-3 times per week and denies chest pain or shortness of breath.  She does drink moderate amount of caffeine with tea and eats chocolates.  She admits to  rare palpitations particularly during periods of increased stress.  She continues to see Dr. Renne Crigler for primary care who checks laboratory.    Since I saw her, she was evaluated in the ER on October 12, 2022 with flulike symptoms and noncardiac issues.  Presently, Ms. Louvier continues to feel well.  With her longstanding history of multiple sclerosis, she occasionally has what she describes as "MS hug."  She can just be sitting and then experiences transient crushing chest pain, back pain and arm pain which lasts 5 to 10 minutes and is spontaneously relieved.  It is not exertional.  There is also a history of possibly associated esophageal spasm.  She remains active and denies any chest pain with walking.  She continues to see Dr. Merri [name] for primary care.  She is on Cartia XT 240 mg, isosorbide 90 mg, losartan 75 mg for hypertension and potential esophageal spasm.  She takes pantoprazole for GERD.  She is on Repatha for lipid management and takes Symbicort.  Laboratory by Dr. Renne Crigler on September 28, 2022 showing total cholesterol 156, HDL 68, LDL 73, triglycerides 78.  She presents for yearly evaluation.   Past Medical History:  Diagnosis Date   Anemia    past history-many yrs ago   Anginal  pain (HCC)    being evaluated by Dr. Joella Prince, arm pain,"bad indigestion" -no heart related findings as of yet   Arthritis    hip. back pain   Asthma    Complication of anesthesia    s/p Hysterectomy "vagal response "heart stopped" -did not require shocking.   Coronary artery disease    Dry eyes    Esophageal spasm    GERD (gastroesophageal reflux disease)    MS (multiple sclerosis) (HCC)    stable-sees Jeffrey every 6 months    Past Surgical History:  Procedure Laterality Date   ABDOMINAL HYSTERECTOMY     BLEPHAROPLASTY Bilateral    CARDIAC CATHETERIZATION  10/04/06   MINOR CAD,SINGLE VESSEL INVOLVING THE CIRCUMFLEX. 20 TO 30% PROXIMALLY AND 10 TO 20% IN THE MIDDLE SEGMENT.MILD MUSCLE BRIDGING, MID LAD.NORMAL RCA.NORMAL LV FUNCTION.NORMAL MITRAL AND AORTIC VALVE.NORMAL APPEARING AORTA,THORACIC AND ABDOMINAL.NORMAL RENAL ARTERIES.   CARDIOLOGY NUCLEAR MED STUDY  06/22/12   NL LV FUNCTION,EF 68%,NL WALL MOTION.   CAROTID DUPLEX  07/02/11   LKG:MWNU SOFT PLAQUE NOTED DISTAL CCA AND ORGIN AND PROXIMAL ICA,LEFT>RIGHT.NO ICA STENOSIS. VERTEBRAL ARTERY FLOW IS ANTEGRADE.   CESAREAN SECTION     x2    COLONOSCOPY WITH PROPOFOL N/A 12/11/2014   Procedure: COLONOSCOPY WITH PROPOFOL;  Surgeon: Bernette Redbird, MD;  Location: [facility];  Service: Endoscopy;  Laterality: N/A;   KNEE ARTHROSCOPY Left    scope   PARATHYROIDECTOMY     partial-many years ago   thumb surgery Bilateral    built up and bone removal   TONSILLECTOMY     TRANSTHORACIC ECHOCARDIOGRAM  07/02/11   LV CAVITY SIZE IS NORMAL. SYSTOLIC FUNCTION WAS NORMAL.EF=55% TO 60%.INCREASED RELATIVE CONTRIBUTION OF ATRIAL CONTRACTION TO VENTRICULAR FILLING;MAYBE DUE TO HYPOVOLEMIA. AV=MILD REGURG.    Current Medications: Outpatient Medications Prior to Visit  Medication Sig Dispense Refill   albuterol (VENTOLIN HFA) 108 (90 Base) MCG/ACT inhaler Inhale 2 puffs into the lungs every 6 (six) hours as needed for wheezing or shortness of  breath. 8 g 2   baclofen (LIORESAL) 10 MG tablet Take 1/2 to 1 pill every night and 1 pill po qd prn spasticity 180 each 4   Calcium Acetate, Phos Binder, (CALCIUM ACETATE PO)  Take by mouth.     CARTIA XT 240 MG 24 hr capsule TAKE ONE CAPSULE BY MOUTH DAILY 90 capsule 0   Cholecalciferol (VITAMIN D PO) Take 1 tablet by mouth daily.     denosumab (PROLIA) 60 MG/ML SOSY injection 60 mg     EPINEPHRINE 0.3 mg/0.3 mL IJ SOAJ injection USE AS DIRECTED 2 each 3   Evolocumab (REPATHA) 140 MG/ML SOSY Inject into the skin.     ipratropium-albuterol (DUONEB) 0.5-2.5 (3) MG/3ML SOLN Take 3 mLs by nebulization 2 (two) times daily. (Patient taking differently: Take 3 mLs by nebulization 2 (two) times daily. Pt takes as needed) 360 mL 5   isosorbide mononitrate (IMDUR) 60 MG 24 hr tablet TAKE 1 & 1/2 TABLETS ONCE DAILY 135 tablet 3   lamoTRIgine (LAMICTAL) 25 MG tablet For 5 days take one pill a day then take one pill po bid 60 tablet 11   losartan (COZAAR) 50 MG tablet TAKE 1 & 1/2 TABLETS ONCE DAILY 90 tablet 3   Multiple Vitamin (MULTIVITAMIN ADULT PO) Take by mouth.     nitroGLYCERIN (NITROSTAT) 0.4 MG SL tablet PLACE 1 TABLET UNDER THE TONGUE AS NEEDED FOR CHEST PAIN, MAY REPEAT EVERY 5 MINUTES. 25 tablet 3   pantoprazole (PROTONIX) 20 MG tablet TAKE ONE TABLET BY MOUTH TWICE DAILY 60 tablet 1   PARoxetine (PAXIL) 10 MG tablet Take 5 mg by mouth daily.      predniSONE (DELTASONE) 10 MG tablet 4tabx1d,3tabx1d,2tabx1d,1tabx1d, 0.5x1d 11 tablet 0   Respiratory Therapy Supplies (FLUTTER) DEVI Use as directed 1 each 0   SYMBICORT 80-4.5 MCG/ACT inhaler INHALE TWO PUFFS INTO THE LUNGS EVERY MORNING AND AT BEDTIME 10.2 g 12   No facility-administered medications prior to visit.     Allergies:   Crestor [rosuvastatin], Iodinated contrast media, Vicodin [hydrocodone-acetaminophen], and Isovue [iopamidol]   Social History   Socioeconomic History   Marital status: Divorced    Spouse name: Not on file    Number of children: 3   Years of education: Not on file   Highest education level: Master's degree (e.g., MA, MS, MEng, MEd, MSW, MBA)  Occupational History   Occupation: retired  Tobacco Use   Smoking status: Never   Smokeless tobacco: Never  Vaping Use   Vaping status: Never Used  Substance and Sexual Activity   Alcohol use: Yes    Alcohol/week: 0.0 standard drinks of alcohol    Comment: wine occ.   Drug use: No   Sexual activity: Not on file  Other Topics Concern   Not on file  Social History Narrative   Lives alone and new puppy   R handed   Caffeine: 16oz a day   Social Drivers of Corporate investment banker Strain: Not on file  Food Insecurity: Not on file  Transportation Needs: Not on file  Physical Activity: Not on file  Stress: Not on file  Social Connections: Not on file     Family History:  The patient's family history includes Asthma in her father; Bladder Cancer in her father; Heart disease in her mother; Heart failure in her father; Hyperlipidemia in her brother.   ROS General: Negative; No fevers, chills, or night sweats;  HEENT: Negative; No changes in vision or hearing, sinus congestion, difficulty swallowing Pulmonary: History of asthma Cardiovascular: Negative; No chest pain, presyncope, syncope, palpitations GI: Negative; No nausea, vomiting, diarrhea, or abdominal pain GU: Negative; No dysuria, hematuria, or difficulty voiding Musculoskeletal: Multiple sclerosis Hematologic/Oncology: Negative; no easy  bruising, bleeding Endocrine: Negative; no heat/cold intolerance; no diabetes Neuro: Multiple sclerosis; no changes in balance, headaches Skin: Negative; No rashes or skin lesions Psychiatric: Negative; No behavioral problems, depression Sleep: Negative; No snoring, daytime sleepiness, hypersomnolence, bruxism, restless legs, hypnogognic hallucinations, no cataplexy Other comprehensive 14 point system review is negative.   PHYSICAL EXAM:   VS:   BP 130/74 (BP Location: Left Arm, Patient Position: Sitting)   Pulse 69   Ht 5' 2.5" (1.588 m)   Wt 146 lb 9.6 oz (66.5 kg)   SpO2 94%   BMI 26.39 kg/m     Repeat blood pressure by me was 128/74  Wt Readings from Last 3 Encounters:  05/11/23 146 lb 9.6 oz (66.5 kg)  12/12/22 146 lb 6.4 oz (66.4 kg)  11/04/22 146 lb 9.6 oz (66.5 kg)    General: Alert, oriented, no distress.  Skin: normal turgor, no rashes, warm and dry HEENT: Normocephalic, atraumatic. Pupils equal round and reactive to light; sclera anicteric; extraocular muscles intact;  Nose without nasal septal hypertrophy Mouth/Parynx benign; Mallinpatti scale 3 Neck: No JVD, no carotid bruits; normal carotid upstroke Lungs: clear to ausculatation and percussion; no wheezing or rales Chest wall: without tenderness to palpitation Heart: PMI not displaced, RRR, s1 s2 normal, 2/6 systolic murmur, no diastolic murmur, no rubs, gallops, thrills, or heaves Abdomen: soft, nontender; no hepatosplenomehaly, BS+; abdominal aorta nontender and not dilated by palpation. Back: no CVA tenderness Pulses 2+ Musculoskeletal: full range of motion, normal strength, no joint deformities Extremities: no clubbing cyanosis or edema, Homan's sign negative  Neurologic: grossly nonfocal; Cranial nerves grossly wnl Psychologic: Normal mood and affect    Studies/Labs Reviewed:   EKG Interpretation Date/Time:  Thursday May 11 2023 10:37:50 EST Ventricular Rate:  69 PR Interval:  140 QRS Duration:  68 QT Interval:  394 QTC Calculation: 422 R Axis:   -2  Text Interpretation: Normal sinus rhythm Left ventricular hypertrophy with repolarization abnormality ( R in aVL ) When compared with ECG of 25-Mar-2018 15:10, Nonspecific T wave abnormality no longer evident in Inferior leads T wave inversion less evident in Anterolateral leads Confirmed by [name] ([id number]) on 05/11/2023 11:50:26 AM    April 27, 2022 ECG (independently read by me):  Sinus bradycardia at 58, pssible LAE  September 03, 2020 ECG (independently read by me):  NSR at 74; , no ectopy, normal intervals    January 30, 2020 ECG (independently read by me): Sinus bradycardia at 59, LAE, no ectopy  Recent Labs:    Latest Ref Rng & Units 10/12/2022    8:02 PM 03/26/2021    9:05 AM 08/03/2020    4:03 PM  BMP  Glucose 70 - 99 mg/dL 130  77  865   BUN 8 - 23 mg/dL 14  11  14    Creatinine 0.44 - 1.00 mg/dL 7.84  6.96  2.95   Sodium 135 - 145 mmol/L 138  142  141   Potassium 3.5 - 5.1 mmol/L 4.0  3.7  3.9   Chloride 98 - 111 mmol/L 105  107  108   CO2 22 - 32 mmol/L 25  28  26    Calcium 8.9 - 10.3 mg/dL 9.1  8.8  9.8         Latest Ref Rng & Units 10/12/2022    8:02 PM 03/26/2021    9:05 AM 08/03/2020    4:03 PM  Hepatic Function  Total Protein 6.5 - 8.1 g/dL 6.7  6.3  7.1  Albumin 3.5 - 5.0 g/dL 4.5  4.1  4.4   AST 15 - 41 U/L 15  26  19    ALT 0 - 44 U/L 15  48  27   Alk Phosphatase 38 - 126 U/L 49  57  46   Total Bilirubin 0.3 - 1.2 mg/dL 0.4  0.3  0.4   Bilirubin, Direct 0.0 - 0.3 mg/dL  0.1         Latest Ref Rng & Units 03/26/2021    9:05 AM 08/03/2020    4:03 PM 05/12/2020   12:37 PM  CBC  WBC 4.0 - 10.5 K/uL 4.8  7.3  6.3   Hemoglobin 12.0 - 15.0 g/dL 62.9  52.8  41.3   Hematocrit 36.0 - 46.0 % 41.2  42.3  40.2   Platelets 150.0 - 400.0 K/uL 186.0  245  195.0    Lab Results  Component Value Date   MCV 91.2 03/26/2021   MCV 96.8 08/03/2020   MCV 94.4 05/12/2020   Lab Results  Component Value Date   TSH 0.735 07/02/2011   No results found for: "HGBA1C"   BNP No results found for: "BNP"  ProBNP No results found for: "PROBNP"   Lipid Panel  No results found for: "CHOL", "TRIG", "HDL", "CHOLHDL", "VLDL", "LDLCALC", "LDLDIRECT", "LABVLDL"   RADIOLOGY: No results found.   Additional studies/ records that were reviewed today include:    ECHO: 03/04/2017 - Procedure narrative: Transthoracic echocardiography. Image    quality was  adequate. The study was technically difficult.  - Left ventricle: The cavity size was normal. Wall thickness was    increased in a pattern of moderate LVH. Systolic function was    normal. The estimated ejection fraction was in the range of 60%    to 65%. Wall motion was normal; there were no regional wall    motion abnormalities. Doppler parameters are consistent with    abnormal left ventricular relaxation (grade 1 diastolic    dysfunction). The E/e&' ratio is between 8-15, suggesting    indeterminate LV filling pressure.  - Aortic valve: Mildly calcified leaflets. Mild stenosis. There was    trivial regurgitation. Mean gradient (S): 10 mm Hg. Peak gradient    (S): 20 mm Hg. Valve area (VTI): 1.34 cm^2. Valve area (Vmax):    1.23 cm^2.  - Left atrium: The atrium was normal in size.  - Inferior vena cava: The vessel was normal in size. The    respirophasic diameter changes were in the normal range (>= 50%),    consistent with normal central venous pressure.   Impressions:   - Technically difficult study. LVEF 60-65%, moderate LVH, normal    wall motion, grade 1 DD, indeterminate LV filling pressure, mild    aortic stenosis with trivial AI, normal LA size, normal IVC.    ASSESSMENT:    1. Coronary artery disease involving native coronary artery of native heart without angina pectoris   2. Essential hypertension   3. Palpitations   4. Nonrheumatic aortic valve stenosis   5. Aortic heart murmur   6. Chest fullness   7. Multiple sclerosis (HCC)   8. Mild asthma, unspecified whether complicated     PLAN:  Ms. Mashell Sieben is a 78 year old female with previous documentation of mild CAD as well as muscle bridging.  She has been documented as normal systolic function with grade 1 diastolic dysfunction and mild aortic stenosis.  She has multiple sclerosis as well as asthma.  In the past  she had experienced some atypical chest pain and has undergone Myoview studies with the most recent  in 2021-01-02remaining low risk.  Remotely, she had noted some palpitations which improved with caffeine reduction as well as chocolate intake reduction.  She had undergone an echo Doppler study in 2018 which showed EF at 60 to 65% without wall motion abnormalities.  There is trivial aortic regurgitation, insignificant.  She has been followed by Dr. Claretta Fraise for multiple sclerosis and at times has a MS "hug" where she develops crushing pain in her chest back both arms last approximately 5 to 10 minutes and resolves.  She also may have associated esophageal spasm which has improved.  Blood pressure today is stable on her current regimen of Cartia XT 240 mg, isosorbide 90 mg, losartan 75 mg daily.  She has GERD on pantoprazole.  ECG shows sinus rhythm with LVH and nonspecific T wave abnormality.  I have recommended that she undergo a 7-year follow-up echo Doppler evaluation for reassessment of her LV function and aortic stenosis.  I also have recommended she undergo coronary CTA to make certain she is not having significant underlying CAD.  She continues to be on Repatha for PCSK9 inhibition and most recent LDL cholesterol was 73 when checked by Dr. Merri [name].  I will see her in 2 to 3 months for follow-up evaluation or sooner as needed.  I discussed my plans for upcoming retirement later this year and at that next office visit I will transition her to a new cardiologist.   Medication Adjustments/Labs and Tests Ordered: Current medicines are reviewed at length with the patient today.  Concerns regarding medicines are outlined above.  Medication changes, Labs and Tests ordered today are listed in the Patient Instructions below. Patient Instructions  Medication Instructions:  No medication changes were made during today's visit  *If you need a refill on your cardiac medications before your next appointment, please call your pharmacy*   Lab Work: No labs were ordered during today's visit.  If you  have labs (blood work) drawn today and your tests are completely normal, you will receive your results only by: MyChart Message (if you have MyChart) OR A paper copy in the mail If you have any lab test that is abnormal or we need to change your treatment, we will call you to review the results.   Testing/Procedures: Your physician has requested that you have an echocardiogram. Echocardiography is a painless test that uses sound waves to create images of your heart. It provides your doctor with information about the size and shape of your heart and how well your heart's chambers and valves are working. This procedure takes approximately one hour. There are no restrictions for this procedure.   Please do NOT wear cologne, perfume, aftershave, or lotions (deodorant is allowed). Please arrive 15 minutes prior to your appointment time.  Please note: We ask at that you not bring children with you during ultrasound (echo/ vascular) testing. Due to room size and safety concerns, children are not allowed in the ultrasound rooms during exams. Our front office staff cannot provide observation of children in our lobby area while testing is being conducted. An adult accompanying a patient to their appointment will only be allowed in the ultrasound room at the discretion of the ultrasound technician under special circumstances. We apologize for any inconvenience.  .CT   Your cardiac CT will be scheduled at one of the below locations:   [facility] [address]  [address] [place], [place] [address] [phone]  OR  [facility] [address] [place], [place] [address] [phone]  OR   [facility] [address] [place], [place] [address] [phone]  OR   [facility] [address] [place], [place] [address] [phone]  If scheduled at [facility], please arrive at the [facility] and  Children's Entrance (Entrance C2) of [facility] 30 minutes prior to test start time. You can use the FREE valet parking offered at entrance C (encouraged to control the heart rate for the test)  Proceed to the [facility] Radiology Department (first floor) to check-in and test prep.  All radiology patients and guests should use entrance C2 at [facility], accessed from [facility], even though the hospital's physical address listed is [address]    If scheduled at [facility] or [facility], please arrive 15 mins early for check-in and test prep.  There is spacious parking and easy access to the radiology department from the [facility] entrance. Please enter here and check-in with the desk attendant.   If scheduled at [facility], please arrive 30 minutes early for check-in and test prep.  Please follow these instructions carefully (unless otherwise directed):  An IV will be required for this test and Nitroglycerin will be given.  Hold all erectile dysfunction medications at least 3 days (72 hrs) prior to test. (Ie viagra, cialis, sildenafil, tadalafil, etc)   On the Night Before the Test: Be sure to Drink plenty of water. Do not consume any caffeinated/decaffeinated beverages or chocolate 12 hours prior to your test. Do not take any antihistamines 12 hours prior to your test. If the patient has contrast allergy: Patient will need a prescription for Prednisone and very clear instructions (as follows): Prednisone 50 mg - take 13 hours prior to test Take another Prednisone 50 mg 7 hours prior to test Take another Prednisone 50 mg 1 hour prior to test Take Benadryl 50 mg 1 hour prior to test Patient must complete all four doses of above prophylactic medications. Patient will need a ride after test due to Benadryl.  On the Day of the Test: Drink plenty of water until 1 hour prior  to the test. Do not eat any food 1 hour prior to test. You may take your regular medications prior to the test.  Take metoprolol (Lopressor) two hours prior to test. If you take Furosemide/Hydrochlorothiazide/Spironolactone/Chlorthalidone, please HOLD on the morning of the test. Patients who wear a continuous glucose monitor MUST remove the device prior to scanning. FEMALES- please wear underwire-free bra if available, avoid dresses & tight clothing      After the Test: Drink plenty of water. After receiving IV contrast, you may experience a mild flushed feeling. This is normal. On occasion, you may experience a mild rash up to 24 hours after the test. This is not dangerous. If this occurs, you can take Benadryl 25 mg, Zyrtec, Claritin, or Allegra and increase your fluid intake. (Patients taking Tikosyn should avoid Benadryl, and may take Zyrtec, Claritin, or Allegra) If you experience trouble breathing, this can be serious. If it is severe call 911 IMMEDIATELY. If it is mild, please call our office.  We will call to schedule your test 2-4 weeks out understanding that some insurance companies will need an authorization prior to the service being performed.  For more information and frequently asked questions, please visit our website : [email or web address]  For non-scheduling related questions, please contact the cardiac imaging nurse navigator should you have any questions/concerns: Cardiac Imaging Nurse Navigators Direct Office Dial: [phone]   For scheduling needs, including cancellations and rescheduling, please call [place], [phone].    Follow-Up: At [facility], you and your health needs are our priority.  As part of our continuing mission to provide you with exceptional heart care, we have created designated Provider Care Teams.  These Care Teams include your primary Cardiologist (physician) and Advanced Practice Providers (APPs -  Physician  Assistants and Nurse Practitioners) who all work together to provide you with the care you need, when you need it.  We recommend signing up for the patient portal called "MyChart".  Sign up information is provided on this After Visit Summary.  MyChart is used to connect with patients for Virtual Visits (Telemedicine).  Patients are able to view lab/test results, encounter notes, upcoming appointments, etc.  Non-urgent messages can be sent to your provider as well.   To learn more about what you can do with MyChart, go to [email or web address].    Your next appointment:   2-3 month(s)  Provider:   Nicki Guadalajara, MD     Other Instructions If you have any questions or concerns regarding your c-pap, bi-pap or sleep accessories, please contact Brandie Rorie at [phone].       Signed, Robyn Guadalajara, MD  05/13/2023 12:14 PM    [facility] [address], [place], [place]  [address] Phone: [phone]

## 2023-05-13 ENCOUNTER — Encounter: Payer: Self-pay | Admitting: Cardiovascular Disease

## 2023-05-15 ENCOUNTER — Other Ambulatory Visit: Payer: Self-pay | Admitting: Internal Medicine

## 2023-05-25 ENCOUNTER — Telehealth: Payer: Self-pay | Admitting: Cardiovascular Disease

## 2023-05-25 ENCOUNTER — Other Ambulatory Visit (HOSPITAL_COMMUNITY): Payer: Self-pay

## 2023-05-25 ENCOUNTER — Encounter (HOSPITAL_COMMUNITY): Payer: Self-pay

## 2023-05-25 ENCOUNTER — Telehealth: Payer: Self-pay

## 2023-05-25 MED ORDER — METOPROLOL TARTRATE 50 MG PO TABS
ORAL_TABLET | ORAL | 0 refills | Status: DC
  Filled 2023-05-25: qty 1, fill #0

## 2023-05-25 MED ORDER — PREDNISONE 50 MG PO TABS
ORAL_TABLET | ORAL | 0 refills | Status: DC
  Filled 2023-05-25: qty 3, fill #0

## 2023-05-25 MED ORDER — METOPROLOL TARTRATE 50 MG PO TABS: ORAL_TABLET | ORAL | 0 refills | Status: DC

## 2023-05-25 MED ORDER — PREDNISONE 50 MG PO TABS: ORAL_TABLET | ORAL | 0 refills | Status: DC

## 2023-05-25 MED ORDER — METOPROLOL TARTRATE 25 MG PO TABS: ORAL_TABLET | ORAL | 0 refills | Status: DC

## 2023-05-25 NOTE — Telephone Encounter (Signed)
 Pt c/o medication issue:  1. Name of Medication: metoprolol tartrate (LOPRESSOR) 25 MG tablet   2. How are you currently taking this medication (dosage and times per day)?   3. Are you having a reaction (difficulty breathing--STAT)?   4. What is your medication issue?   Caller Selena Batten) stated they received two prescriptions for this medication 25 mg and 50 mg.  Caller wants a call back to confirm which medication patient should be taking.

## 2023-05-25 NOTE — Telephone Encounter (Signed)
 Patient identification verified by 2 forms. Shade Flood, RN     Called and spoke to patient  Patient states:  - She is scheduled for CT scan 05/29/23 - Pt reported that CT instructions were not reviewed with her at last OV or by navigators from the hospital and she is not sure which medication she should take 2hrs before test time.              Interventions/Plan: - Reviewed CCTA patient insutructions.  - Patient has contrast (ISOVIEW) allergy. Prescription for prednisone sent to pharmacy. Prescription for Metoprolol Tartrate 25mg   sent to pharmacy per DOD recommendation today.    Your cardiac CT will be scheduled at one of the below locations:   [facility] [address] [place], [place] [address] [phone]   If scheduled at [facility], please arrive at the [facility] and Children's Entrance (Entrance C2) of [facility] 30 minutes prior to test start time. You can use the FREE valet parking offered at entrance C (encouraged to control the heart rate for the test)  Proceed to the [facility] Radiology Department (first floor) to check-in and test prep.  All radiology patients and guests should use entrance C2 at [facility], accessed from [facility], even though the hospital's physical address listed is [address]     Please follow these instructions carefully (unless otherwise directed):  An IV will be required for this test and Nitroglycerin will be given.   On the Night Before the Test: Be sure to Drink plenty of water. Do not consume any caffeinated/decaffeinated beverages or chocolate 12 hours prior to your test. Do not take any antihistamines 12 hours prior to your test. If the patient has contrast allergy: Patient will need a prescription for Prednisone and very clear instructions (as follows): Prednisone 50 mg - take 13 hours prior to test Take another Prednisone 50 mg 7 hours prior to test Take  another Prednisone 50 mg 1 hour prior to test Take Benadryl 50 mg 1 hour prior to test Patient must complete all four doses of above prophylactic medications. Patient will need a ride after test due to Benadryl.  On the Day of the Test: Drink plenty of water until 1 hour prior to the test. Do not eat any food 1 hour prior to test. You may take your regular medications prior to the test.  Take metoprolol (Lopressor) 25 MG two hours prior to test. Patients who wear a continuous glucose monitor MUST remove the device prior to scanning. FEMALES- please wear underwire-free bra if available, avoid dresses & tight clothing       After the Test: Drink plenty of water. After receiving IV contrast, you may experience a mild flushed feeling. This is normal. On occasion, you may experience a mild rash up to 24 hours after the test. This is not dangerous. If this occurs, you can take Benadryl 25 mg, Zyrtec, Claritin, or Allegra and increase your fluid intake. (Patients taking Tikosyn should avoid Benadryl, and may take Zyrtec, Claritin, or Allegra) If you experience trouble breathing, this can be serious. If it is severe call 911 IMMEDIATELY. If it is mild, please call our office.  We will call to schedule your test 2-4 weeks out understanding that some insurance companies will need an authorization prior to the service being performed.   For more information and frequently asked questions, please visit our website : [email or web address]  For non-scheduling related questions,  please contact the cardiac imaging nurse navigator should you have any questions/concerns: Cardiac Imaging Nurse Navigators Direct Office Dial: [phone]   For scheduling needs, including cancellations and rescheduling, please call [place], [phone].   Reviewed ED warning signs/precautions  Patient agrees with plan, no questions at this time

## 2023-05-25 NOTE — Telephone Encounter (Signed)
 Patient identification verified by 2 forms. Shade Flood, RN     Called and spoke with [facility]. Requested cancellation of prescriptions for metoprolol tartrate 100mg  and prednisone 50mg  3x dose. Prescriptions sent to wrong pharmacy.   Ally verbalized understanding. No further questions at this time.

## 2023-05-25 NOTE — Telephone Encounter (Signed)
 Pt c/o medication issue:  1. Name of Medication: Medication needed prior to CT test   2. How are you currently taking this medication (dosage and times per day)?   3. Are you having a reaction (difficulty breathing--STAT)?   4. What is your medication issue? Patient is requesting call back to discuss medication that is needed prior to upcoming CT.

## 2023-05-25 NOTE — Telephone Encounter (Signed)
 Called pharmacy and requested cancellation of 50mg  metoprolol tartrate.  Patient to pick up metoprolol tartrate 25mg  and prednisone 50mg  3x dose.    Pharmacist verbalized understanding. No further questions at this time.

## 2023-05-26 ENCOUNTER — Telehealth (HOSPITAL_COMMUNITY): Payer: Self-pay | Admitting: *Deleted

## 2023-05-26 NOTE — Telephone Encounter (Signed)
 Reaching out to patient to offer assistance regarding upcoming cardiac imaging study; pt verbalizes understanding of appt date/time, parking situation and where to check in, pre-test NPO status and medications ordered, and verified current allergies; name and call back number provided for further questions should they arise  Larey Brick RN Navigator Cardiac Imaging [facility] [phone] office [phone] cell  Reviewed how to take 13 hour prep and metoprolol tartrate with patient. She verbalized understanding and is aware to arrive at 11 AM.

## 2023-05-26 NOTE — Telephone Encounter (Signed)
 Acknowledged.

## 2023-05-29 ENCOUNTER — Ambulatory Visit (HOSPITAL_COMMUNITY)
Admission: RE | Admit: 2023-05-29 | Discharge: 2023-05-29 | Disposition: A | Discharge status: Home / Self Care | Payer: Medicare PPO | Source: Ambulatory Visit | Attending: Cardiovascular Disease | Admitting: Cardiovascular Disease

## 2023-05-29 DIAGNOSIS — R0789 Other chest pain: Secondary | ICD-10-CM | POA: Insufficient documentation

## 2023-05-29 DIAGNOSIS — I700 Atherosclerosis of aorta: Secondary | ICD-10-CM | POA: Diagnosis not present

## 2023-05-29 DIAGNOSIS — I2510 Atherosclerotic heart disease of native coronary artery without angina pectoris: Secondary | ICD-10-CM | POA: Insufficient documentation

## 2023-05-29 MED ORDER — IOHEXOL 350 MG/ML SOLN
95.0000 mL | Freq: Once | INTRAVENOUS | Status: AC | PRN
  Administered 2023-05-29: 95 mL via INTRAVENOUS

## 2023-05-29 MED ORDER — NITROGLYCERIN 0.4 MG SL SUBL
SUBLINGUAL_TABLET | SUBLINGUAL | Status: AC
  Filled 2023-05-29: qty 2

## 2023-05-29 MED ORDER — NITROGLYCERIN 0.4 MG SL SUBL
0.8000 mg | SUBLINGUAL_TABLET | Freq: Once | SUBLINGUAL | Status: AC
  Administered 2023-05-29: 0.8 mg via SUBLINGUAL

## 2023-06-02 NOTE — Telephone Encounter (Signed)
 Acknowledged.

## 2023-06-07 DIAGNOSIS — N3946 Mixed incontinence: Secondary | ICD-10-CM | POA: Diagnosis not present

## 2023-06-07 DIAGNOSIS — M6281 Muscle weakness (generalized): Secondary | ICD-10-CM | POA: Diagnosis not present

## 2023-06-07 DIAGNOSIS — R159 Full incontinence of feces: Secondary | ICD-10-CM | POA: Diagnosis not present

## 2023-06-07 DIAGNOSIS — K5900 Constipation, unspecified: Secondary | ICD-10-CM | POA: Diagnosis not present

## 2023-06-12 ENCOUNTER — Ambulatory Visit: Payer: Medicare PPO

## 2023-06-12 VITALS — BP 119/66 | HR 67 | Temp 98.1°F | Resp 18 | Ht 63.0 in | Wt 150.4 lb

## 2023-06-12 DIAGNOSIS — M810 Age-related osteoporosis without current pathological fracture: Secondary | ICD-10-CM

## 2023-06-12 MED ORDER — DENOSUMAB 60 MG/ML ~~LOC~~ SOSY
60.0000 mg | PREFILLED_SYRINGE | Freq: Once | SUBCUTANEOUS | Status: AC
  Administered 2023-06-12: 60 mg via SUBCUTANEOUS

## 2023-06-12 NOTE — Progress Notes (Signed)
 Diagnosis: Osteoporosis  Provider:  Chilton Greathouse MD  Procedure: Injection  Prolia (Denosumab), Dose: 60 mg, Site: subcutaneous, Number of injections: 1  Injection Site(s): Right arm  Post Care: Patient declined observation  Discharge: Condition: Stable, Destination: Home . AVS Declined  Performed by:  Wyvonne Lenz, RN

## 2023-06-13 ENCOUNTER — Ambulatory Visit (HOSPITAL_COMMUNITY)
Admission: RE | Admit: 2023-06-13 | Discharge: 2023-06-13 | Disposition: A | Discharge status: Home / Self Care | Payer: Medicare PPO | Source: Ambulatory Visit | Attending: Internal Medicine | Admitting: Internal Medicine

## 2023-06-13 DIAGNOSIS — I351 Nonrheumatic aortic (valve) insufficiency: Secondary | ICD-10-CM | POA: Diagnosis not present

## 2023-06-13 DIAGNOSIS — I358 Other nonrheumatic aortic valve disorders: Secondary | ICD-10-CM | POA: Diagnosis not present

## 2023-06-13 DIAGNOSIS — I350 Nonrheumatic aortic (valve) stenosis: Secondary | ICD-10-CM | POA: Diagnosis not present

## 2023-06-13 LAB — ECHOCARDIOGRAM COMPLETE
AR max vel: 1.24 cm2
AV Area VTI: 1.22 cm2
AV Area mean vel: 1.17 cm2
AV Mean grad: 11 mmHg
AV Peak grad: 21.3 mmHg
AV Vena cont: 0.43 cm
Ao pk vel: 2.31 m/s
Area-P 1/2: 3.83 cm2
S' Lateral: 2.43 cm

## 2023-06-27 ENCOUNTER — Telehealth: Payer: Self-pay | Admitting: Cardiovascular Disease

## 2023-06-27 ENCOUNTER — Other Ambulatory Visit: Payer: Self-pay | Admitting: Cardiovascular Disease

## 2023-06-27 NOTE — Telephone Encounter (Signed)
*  STAT* If patient is at the pharmacy, call can be transferred to refill team.   1. Which medications need to be refilled? (please list name of each medication and dose if known) diltiazem 240 mg   2. Would you like to learn more about the convenience, safety, & potential cost savings by using the [facility]?      3. Are you open to using the [facility] (Type [facility].  ).   4. Which pharmacy/location (including street and city if local pharmacy) is medication to be sent to? [facility] [place], [place] - [address]    5. Do they need a 30 day or 90 day supply? 90 day Patient is out of medication

## 2023-06-28 ENCOUNTER — Telehealth: Payer: Self-pay | Admitting: Cardiovascular Disease

## 2023-06-28 ENCOUNTER — Other Ambulatory Visit: Payer: Self-pay | Admitting: Cardiovascular Disease

## 2023-06-28 MED ORDER — CARTIA XT 240 MG PO CP24: 240.0000 mg | ORAL_CAPSULE | Freq: Every day | ORAL | 3 refills | Status: DC

## 2023-06-28 NOTE — Telephone Encounter (Signed)
 Pt's pharmacy is stating Pharmacy comment: THIS WAS SENT AS A DAW 1 ( SUBSTITUTION NOT ALLOWED) WE ONLY HAVE GENERIC. IS THIS OK. Would Dr. Tresa Endo like to prescribe Diltiazem 24hr 240 mg capsule instead? Please address

## 2023-06-28 NOTE — Telephone Encounter (Signed)
 Pt's medication was sent to pt's pharmacy as requested. Confirmation received.

## 2023-06-28 NOTE — Telephone Encounter (Signed)
 Pt c/o medication issue:  1. Name of Medication:   Diltiazem  2. How are you currently taking this medication (dosage and times per day)?   3. Are you having a reaction (difficulty breathing--STAT)?   4. What is your medication issue?   Patient is concerned her Diltiazem medication was removed from her med list and wants a call back to discuss this.

## 2023-06-28 NOTE — Telephone Encounter (Signed)
 Patient identification verified by 2 forms. Shade Flood, RN     Called and spoke to patient  Patient states:  - She takes diltiazem but when she called her pharmacy she was told the prescription could not be refilled.  - She contacted our office and was told she did not take that medication and it was not on her medication list.  - She was able to get things sorted out and the prescription was sent to the pharmacy however she is concerned that this is the second incident in a year where her medications were incorrect and she was provided incorrect information.  - Patient would like clinical supervisor to be made aware of mistakes.               Interventions/Plan: - Chart review confirms no changes were made to medications at last OV. Patient currently prescribed CARTIA XT 240 mg daily. - Reviewed medication history and before this week diltiazem was last refilled on 08/18/2022 but discontinued on 03/14/23 w/ the note stating patient overdue for OV and no refills to be processed at that time. - Medication list is up to date and prescription has been sent to pharmacy.  - Encounter forwarded to clinical supervisor and primary cardiologist for documentation.    Patient agrees with plan, no further questions at this time

## 2023-06-28 NOTE — Telephone Encounter (Signed)
 See telephone encounter from 06/28/2023. Prescription was sent to pharmacy and on medication list.  Josie LPN

## 2023-06-30 MED ORDER — DILTIAZEM HCL ER COATED BEADS 240 MG PO CP24: 240.0000 mg | ORAL_CAPSULE | Freq: Every day | ORAL | 3 refills | Status: AC

## 2023-06-30 NOTE — Telephone Encounter (Signed)
 Patient identification verified by 2 forms. Marilynn Rail, RN    Called and spoke to patient  Patient states:   -she has not been contacted by supervisor   -she is concerned about recent issues with medication list   -wants to know why changes are being made without her or provider knowledge   -this is the 2nd time to her knowledge that an error has occurred with her medications   -Rx for Cartia sent on 4/2  -Pharmacy filled it as Diltiazem instead  Informed patient message sent to RN supervisor for assistance  Patient verbalized understanding, no questions at this time

## 2023-06-30 NOTE — Telephone Encounter (Signed)
 Spoke with patient to address concerns. Thanked her for providing valuable feedback. Updated med list to reflect diltiazem 24hr 240mg  rather than Cartia XT 240mg . Advised pt this should not impact her refills on file at [facility] (entered as no print), but will make it more clear on our end when future refills are requested.  At pt's request, also spoke with [place] at [facility]. She confirmed that their team sent a fax request for clarification regarding Cartia vs. Diltiazem 24hr on 06/28/23 at 8:08am. Prescription was ultimately filled with diltiazem 24hr 240mg  capsules that same day after [facility] review. Pt had prescription now and denies any additional needs at this time. Provided direct number for any questions or concerns in the future. Pt verbalized understanding and appreciation of call.

## 2023-06-30 NOTE — Telephone Encounter (Signed)
 Pt calling in regards to this matter. Please advise

## 2023-07-07 ENCOUNTER — Other Ambulatory Visit: Payer: Self-pay | Admitting: Cardiovascular Disease

## 2023-07-17 ENCOUNTER — Other Ambulatory Visit: Payer: Self-pay | Admitting: Cardiovascular Disease

## 2023-07-20 ENCOUNTER — Ambulatory Visit: Payer: Medicare PPO | Attending: Cardiovascular Disease | Admitting: Cardiovascular Disease

## 2023-07-20 ENCOUNTER — Encounter: Payer: Self-pay | Admitting: Cardiovascular Disease

## 2023-07-20 DIAGNOSIS — R0789 Other chest pain: Secondary | ICD-10-CM

## 2023-07-20 DIAGNOSIS — I2510 Atherosclerotic heart disease of native coronary artery without angina pectoris: Secondary | ICD-10-CM

## 2023-07-20 DIAGNOSIS — I350 Nonrheumatic aortic (valve) stenosis: Secondary | ICD-10-CM

## 2023-07-20 DIAGNOSIS — G35 Multiple sclerosis: Secondary | ICD-10-CM

## 2023-07-20 DIAGNOSIS — I10 Essential (primary) hypertension: Secondary | ICD-10-CM

## 2023-07-20 DIAGNOSIS — R002 Palpitations: Secondary | ICD-10-CM

## 2023-07-20 DIAGNOSIS — J4530 Mild persistent asthma, uncomplicated: Secondary | ICD-10-CM | POA: Diagnosis not present

## 2023-07-20 NOTE — Patient Instructions (Signed)
 Medication Instructions: Your physician recommends that you continue on your current medications as directed. Please refer to the Current Medication list given to you today.    *If you need a refill on your cardiac medications before your next appointment, please call your pharmacy*   Lab Work:  NONE   If you have labs (blood work) drawn today and your tests are completely normal, you will receive your results only by: MyChart Message (if you have MyChart) OR A paper copy in the mail If you have any lab test that is abnormal or we need to change your treatment, we will call you to review the results.   Testing/Procedures: NONE    Follow-Up: At [facility], you and your health needs are our priority.  As part of our continuing mission to provide you with exceptional heart care, we have created designated Provider Care Teams.  These Care Teams include your primary Cardiologist (physician) and Advanced Practice Providers (APPs -  Physician Assistants and Nurse Practitioners) who all work together to provide you with the care you need, when you need it.  We recommend signing up for the patient portal called "MyChart".  Sign up information is provided on this After Visit Summary.  MyChart is used to connect with patients for Virtual Visits (Telemedicine).  Patients are able to view lab/test results, encounter notes, upcoming appointments, etc.  Non-urgent messages can be sent to your provider as well.   To learn more about what you can do with MyChart, go to [email or web address].    Your next appointment:   6-8 month(s)  The format for your next appointment:   In Person: [facility]   Provider:   Sheryle Donning, MD    Other Instructions

## 2023-07-20 NOTE — Progress Notes (Unsigned)
 Cardiology Office Note    Date:  07/20/2023   ID:  Robyn, [name] Jun 05, 1945, MRN [id number]  PCP:  Imelda Man, MD  Cardiologist:  Magnus Schuller, MD    History of Present Illness:  [name] is a 78 y.o. female who presents for a 44-month follow-up evaluation.  [name] has documented mild CAD by  cardiac catheterization in July 2008 by Dr. Ace Abu.  She had mild narrowing of 20-30% in the proximal and 10-20% in the mid left circumflex vessel.  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  She has been on medical therapy.  She also has a history of hypertension,.  Her last stress test was done in March 2014 were she had nonspecific T changes and nondiagnostic 0.5-1 mm inferolateral ST segment changes with stress.  Scintigraphic images revealed normal perfusion and function.   She has a history of multiple sclerosis and is followed by Dr. Perri Brake in [place].  She has a history of hyperlipidemia for which she takes Vytorin  10/20 and GERD for which he takes over-the-counter Prilosec.   She has experienced recent episodes of chest pain which have been occurring almost weekly. She experiences squeezing in her arms and jaw.  She denies chest pressure.  The symptoms are not associated with activity and typically resolve on her own.  She has taken nitroglycerin  with questionable benefit.  She also notes calf discomfort at night while sleeping.  She denies restless legs.   She was started on a human monoclonal antibody ocrelizumab   for her multiple sclerosis and admits to improvement in symptomatology.  When I saw her  she had experienced recurrent episodes of chest pain with some atypical features.  She underwent a nuclear perfusion study in December 2016 which revealed normal perfusion with an ejection fraction of 66%.  She developed recurrent chest pain in January and again was felt to be stable cardiovascularly.  She subsequently was evaluated at  [facility] and was felt that her chest discomfort was due to esophageal lower esophageal sphincter spasm and inability to relax appropriately.  Her symptoms have improved with Protonix  and she is now being weaned off Protonix  and has been started on Zantac .   She has been evaluated on several occasions by Ervin Heath, [facility] with his most recent evaluation in March 2019.  He had seen her in December 2018 with chest pain more consistent with esophageal spasm.  An echocardiogram in December 2018 showed an EF of 60 to 65% with grade 1 diastolic dysfunction, mild aortic stenosis with trivial AR.  She has had issues with asthma.  She also had experienced some palpitations and a 24-hour monitor revealed short bursts of SVT, PACs and PVCs. Palpitations have improved though she still experiences some palpitations at night while falling asleep.  She eats chocolate on a daily basis.  Her sleep is very poor.  She has frequent awakenings. She snores.  She has nocturia at least 3 times per night.  He denies any chest pain.    I saw her in September 2019 with her palpitations I recommended discontinuance of amlodipine  and instituted Cardizem  CD 240 mg.  We discussed avoidance of chocolate which contains caffeine.  Due to concerns for sleep apnea I also recommended she undergo a sleep evaluation.  She has also seen pulmonary since her last evaluation with complaints of wheezing and on March 25, 2018 was evaluated in the emergency room with  dizziness.  She had fallen over a dog gate and struck her head.  A head CT did not reveal any acute intracranial abnormality.  There was mild age-appropriate cortical atrophy with moderate to severe chronic microvascular ischemic changes of the white matter.  She is scheduled to undergo a sleep study on January 27.  She continues to experience occasional palpitations at night.  She recently was started on a prednisone  taper due to her wheezing has had some chest congestion.   She underwent a  sleep study on April 23, 2018.She was not found to have significant sleep apnea and her overall AHI was 1.1/h.  However,there was mildly sleep apnea during REM sleep with an AHI of 7.0/h.  There was evidence for soft snoring and her oxygen nadir was 88%.   I evaluated her in a telemedicine visit on Aug 01, 2018.  At that time, she was no longer  taking her multiple sclerosis infusion, due to exacerbation of asthmatic inflammation.  She is now on dupixant and is followed by Dr. Bertrum Brodie.  She does have issues at times where she feels like she just cannot get it very deep breath.  She denies chest pain.  She denies palpitations.  She continues to see her doctor in [place] for her multiple sclerosis.  Since her last evaluation with me, she has been evaluated by Ervin Heath, PA in July 2021.  She had undergone a repeat Myoview  study on March 28, 2019 which showed an EF of 66% and no evidence for prior infarct or ischemia and was interpreted as low risk.  When I last saw her she was experiencing some atypical chest discomfort occurring approximately 2-3 times per month.  He had recommended titration of isosorbide  to 90 mg to help with potential esophageal spasm.  She had a pulmonary evaluation with Eulas Hick, NP on January 14, 2020.  Her asthma was felt to be well controlled..  It was recommended that she stop Zyrtec  in addition to montelukast  and Singulair  and reduce to present to 200 mg every 2 weeks.  Follow-up in 3 months was recommended to do an exhaled nitric oxide  testing.  I saw her in November 2021 at which time she denied any chest pain worrisome for angina.    At times she notes a sharp twinge of discomfort.  She apparently is no longer taking amlodipine  and is on diltiazem  240 mg daily, losartan  50 mg daily for hypertension and continues to be on isosorbide  mononitrate 90 mg.  She is on Vytorin  10/20 for hyperlipidemia.  She was on a Dupixent  injection every 14 days.  She continued to be  on Protonix  20 mg twice a day.    I saw her on September 03 2020. She had labs yesterday by Dr. Perri Brake in [place].  She has had issues with recent bursitis.  She has remained active and goes to the gym 2 times per week and rides a bicycle.  She tells me she had developed red spots and there was concern for possible [facility] spotted fever for which she was treated with antibiotics but her test was negative.  She has not had any anginal symptoms.    I saw her in April 27, 2022 at which time she continued to feel well.  She was going to the gym at least 2-3 times per week and denies chest pain or shortness of breath.  She does drink moderate amount of caffeine with tea and eats chocolates.  She admits to rare  palpitations particularly during periods of increased stress.  She continues to see Dr. Schuyler Custard for primary care who checks laboratory.    Since I saw her, she was evaluated in the ER on October 12, 2022 with flulike symptoms and noncardiac issues.  I last saw her on February 13,025 at which time she continued to feel well.  With her longstanding history of multiple sclerosis, she occasionally has what she describes as "MS hug."  She can just be sitting and then experiences transient crushing chest pain, back pain and arm pain which lasts 5 to 10 minutes and is spontaneously relieved.  It is not exertional.  There is also a history of possibly associated esophageal spasm.  She remains active and denies any chest pain with walking.  She continues to see Dr. Imelda Man for primary care.  She is on Cartia  XT 240 mg, isosorbide  90 mg, losartan  75 mg for hypertension and potential esophageal spasm.  She takes pantoprazole  for GERD.  She is on Repatha for lipid management and takes Symbicort .  Laboratory by Dr. Schuyler Custard on September 28, 2022 showing total cholesterol 156, HDL 68, LDL 73, triglycerides 78.  During that evaluation, her ECG showed sinus rhythm with LVH and nonspecific T wave abnormality.  I  recommended that she undergo a 7-year follow-up echo Doppler assessment of LV function and aortic stenosis.  I also recommended she undergo coronary CTA to assess for underlying CAD.    Coronary CTA was performed on May 29, 2023.  This revealed mild aortic atherosclerosis and clustered subpleural nodules in the anterior right upper lobe unchanged in 2018 exam felt likely sequela of prior infection.  Calcium score was 72.2 agonist and units there was mild 1 to 24% plaque in the distal left main, minimal stenosis with calcified plaque in the LAD, mixed plaque mild 1 to 24% in the circumflex and proximal RCA.  Her 2D echo Doppler study done on June 13, 2023 showed normal systolic function with EF 60 to 65%.  She had mild to moderate aortic valve stenosis with a mean gradient of 11 and a peak gradient of 21.3.  Mitral valve was myxomatous without evidence for mitral regurgitation or stenosis.  Presently, she feels well.  She denies any significant episodes of chest pain.  She will have follow-up laboratory with Dr. Imelda Man in several months and I have recommended that he also check LP(a) at that time.  She continues to be on diltiazem  to 40 mg daily, isosorbide  mononitrate 90 mg, and losartan  75 mg daily.  The isosorbide  has helped her prior presumed esophageal spasm.  She is on Repatha 140 every 2 weeks.  She takes pantoprazole  for GERD.  She takes Symbicort  and as needed albuterol  in addition to DuoNeb.  She presents for evaluation.   Past Medical History:  Diagnosis Date   Anemia    past history-many yrs ago   Anginal pain (HCC)    being evaluated by Dr. Kee Pastel, arm pain,"bad indigestion" -no heart related findings as of yet   Arthritis    hip. back pain   Asthma    Complication of anesthesia    s/p Hysterectomy "vagal response "heart stopped" -did not require shocking.   Coronary artery disease    Dry eyes    Esophageal spasm    GERD (gastroesophageal reflux disease)    MS (multiple  sclerosis) (HCC)    stable-sees Susana Enter every 6 months    Past Surgical History:  Procedure Laterality Date   ABDOMINAL  HYSTERECTOMY     BLEPHAROPLASTY Bilateral    CARDIAC CATHETERIZATION  10/04/06   MINOR CAD,SINGLE VESSEL INVOLVING THE CIRCUMFLEX. 20 TO 30% PROXIMALLY AND 10 TO 20% IN THE MIDDLE SEGMENT.MILD MUSCLE BRIDGING, MID LAD.NORMAL RCA.NORMAL LV FUNCTION.NORMAL MITRAL AND AORTIC VALVE.NORMAL APPEARING AORTA,THORACIC AND ABDOMINAL.NORMAL RENAL ARTERIES.   CARDIOLOGY NUCLEAR MED STUDY  06/22/12   NL LV FUNCTION,EF 68%,NL WALL MOTION.   CAROTID DUPLEX  07/02/11   ZOX:WRUE SOFT PLAQUE NOTED DISTAL CCA AND ORGIN AND PROXIMAL ICA,LEFT>RIGHT.NO ICA STENOSIS. VERTEBRAL ARTERY FLOW IS ANTEGRADE.   CESAREAN SECTION     x2    COLONOSCOPY WITH PROPOFOL  N/A 12/11/2014   Procedure: COLONOSCOPY WITH PROPOFOL ;  Surgeon: Lanita Pitman, MD;  Location: [facility];  Service: Endoscopy;  Laterality: N/A;   KNEE ARTHROSCOPY Left    scope   PARATHYROIDECTOMY     partial-many years ago   thumb surgery Bilateral    built up and bone removal   TONSILLECTOMY     TRANSTHORACIC ECHOCARDIOGRAM  07/02/11   LV CAVITY SIZE IS NORMAL. SYSTOLIC FUNCTION WAS NORMAL.EF=55% TO 60%.INCREASED RELATIVE CONTRIBUTION OF ATRIAL CONTRACTION TO VENTRICULAR FILLING;MAYBE DUE TO HYPOVOLEMIA. AV=MILD REGURG.    Current Medications: Outpatient Medications Prior to Visit  Medication Sig Dispense Refill   albuterol  (VENTOLIN  HFA) 108 (90 Base) MCG/ACT inhaler Inhale 2 puffs into the lungs every 6 (six) hours as needed for wheezing or shortness of breath. 8 g 2   baclofen  (LIORESAL ) 10 MG tablet Take 1/2 to 1 pill every night and 1 pill po qd prn spasticity 180 each 4   Calcium Acetate, Phos Binder, (CALCIUM ACETATE PO) Take by mouth.     Cholecalciferol (VITAMIN D PO) Take 1 tablet by mouth daily.     denosumab  (PROLIA ) 60 MG/ML SOSY injection 60 mg     diltiazem  (CARDIZEM  CD) 240 MG 24 hr capsule Take 1 capsule (240 mg  total) by mouth daily. 90 capsule 3   EPINEPHRINE  0.3 mg/0.3 mL IJ SOAJ injection USE AS DIRECTED 2 each 3   Evolocumab (REPATHA) 140 MG/ML SOSY Inject into the skin.     ipratropium-albuterol  (DUONEB) 0.5-2.5 (3) MG/3ML SOLN Take 3 mLs by nebulization 2 (two) times daily. (Patient taking differently: Take 3 mLs by nebulization 2 (two) times daily. Pt takes as needed) 360 mL 5   isosorbide  mononitrate (IMDUR ) 60 MG 24 hr tablet TAKE 1 & 1/2 TABLETS ONCE DAILY 135 tablet 3   lamoTRIgine  (LAMICTAL ) 25 MG tablet For 5 days take one pill a day then take one pill po bid 60 tablet 11   losartan  (COZAAR ) 50 MG tablet TAKE 1 & 1/2 TABLETS ONCE DAILY 135 tablet 3   metoprolol  tartrate (LOPRESSOR ) 25 MG tablet Take 2 hours before CT scan. 1 tablet 0   Multiple Vitamin (MULTIVITAMIN ADULT PO) Take by mouth.     nitroGLYCERIN  (NITROSTAT ) 0.4 MG SL tablet PLACE 1 TABLET UNDER THE TONGUE AS NEEDED FOR CHEST PAIN, MAY REPEAT EVERY 5 MINUTES. 25 tablet 3   pantoprazole  (PROTONIX ) 20 MG tablet TAKE ONE TABLET BY MOUTH TWICE DAILY 60 tablet 1   PARoxetine  (PAXIL ) 10 MG tablet Take 5 mg by mouth daily.      Respiratory Therapy Supplies (FLUTTER) DEVI Use as directed 1 each 0   SYMBICORT  80-4.5 MCG/ACT inhaler INHALE TWO PUFFS INTO THE LUNGS EVERY MORNING AND AT BEDTIME 10.2 g 12   predniSONE  (DELTASONE ) 10 MG tablet 4tabx1d,3tabx1d,2tabx1d,1tabx1d, 0.5x1d 11 tablet 0   predniSONE  (DELTASONE ) 50 MG tablet Take  50 mg (one tablet) 13 hours before procedure. Take 50 mg (one tablet) 7 hours before procedure. Take 50 mg (one tablet) before leaving home before procedure. 3 tablet 0   No facility-administered medications prior to visit.     Allergies:   Crestor [rosuvastatin], Iodinated contrast media, Vicodin [hydrocodone -acetaminophen ], and Isovue  [iopamidol ]   Social History   Socioeconomic History   Marital status: Divorced    Spouse name: Not on file   Number of children: 3   Years of education: Not on file    Highest education level: Master's degree (e.g., MA, MS, MEng, MEd, MSW, MBA)  Occupational History   Occupation: retired  Tobacco Use   Smoking status: Never   Smokeless tobacco: Never  Vaping Use   Vaping status: Never Used  Substance and Sexual Activity   Alcohol use: Yes    Alcohol/week: 0.0 standard drinks of alcohol    Comment: wine occ.   Drug use: No   Sexual activity: Not on file  Other Topics Concern   Not on file  Social History Narrative   Lives alone and new puppy   R handed   Caffeine: 16oz a day   Social Drivers of Corporate investment banker Strain: Not on file  Food Insecurity: Not on file  Transportation Needs: Not on file  Physical Activity: Not on file  Stress: Not on file  Social Connections: Not on file     Family History:  The patient's family history includes Asthma in her father; Bladder Cancer in her father; Heart disease in her mother; Heart failure in her father; Hyperlipidemia in her brother.   ROS General: Negative; No fevers, chills, or night sweats;  HEENT: Negative; No changes in vision or hearing, sinus congestion, difficulty swallowing Pulmonary: History of asthma Cardiovascular: Negative; No chest pain, presyncope, syncope, palpitations GI: Negative; No nausea, vomiting, diarrhea, or abdominal pain GU: Negative; No dysuria, hematuria, or difficulty voiding Musculoskeletal: Multiple sclerosis Hematologic/Oncology: Negative; no easy bruising, bleeding Endocrine: Negative; no heat/cold intolerance; no diabetes Neuro: Multiple sclerosis; no changes in balance, headaches Skin: Negative; No rashes or skin lesions Psychiatric: Negative; No behavioral problems, depression Sleep: Negative; No snoring, daytime sleepiness, hypersomnolence, bruxism, restless legs, hypnogognic hallucinations, no cataplexy Other comprehensive 14 point system review is negative.   PHYSICAL EXAM:   VS:  BP (!) 140/74   Pulse 71   Ht 5\' 2"  (1.575 m)   Wt 151 lb  (68.5 kg)   SpO2 95%   BMI 27.62 kg/m     Repeat blood pressure by me was 136/74  Wt Readings from Last 3 Encounters:  07/20/23 151 lb (68.5 kg)  06/12/23 150 lb 6.4 oz (68.2 kg)  05/11/23 146 lb 9.6 oz (66.5 kg)   General: Alert, oriented, no distress.  Skin: normal turgor, no rashes, warm and dry HEENT: Normocephalic, atraumatic. Pupils equal round and reactive to light; sclera anicteric; extraocular muscles intact;  Nose without nasal septal hypertrophy Mouth/Parynx benign; Mallinpatti scale 3 Neck: No JVD, no carotid bruits; normal carotid upstroke Lungs: clear to ausculatation and percussion; no wheezing or rales Chest wall: without tenderness to palpitation Heart: PMI not displaced, RRR, s1 s2 normal, 2/6 systolic murmur, no diastolic murmur, no rubs, gallops, thrills, or heaves Abdomen: soft, nontender; no hepatosplenomehaly, BS+; abdominal aorta nontender and not dilated by palpation. Back: no CVA tenderness Pulses 2+ Musculoskeletal: full range of motion, normal strength, no joint deformities Extremities: no clubbing cyanosis or edema, Homan's sign negative  Neurologic: grossly nonfocal;  Cranial nerves grossly wnl Psychologic: Normal mood and affect    Studies/Labs Reviewed:   EKG Interpretation Date/Time:  Thursday July 20 2023 12:16:02 EDT Ventricular Rate:  71 PR Interval:  142 QRS Duration:  68 QT Interval:  310 QTC Calculation: 336 R Axis:   21  Text Interpretation: Normal sinus rhythm Nonspecific T wave abnormality When compared with ECG of 11-May-2023 10:37, Nonspecific T wave abnormality, worse in Anterolateral leads QT has shortened Confirmed by Magnus Schuller ([id number]) on 07/20/2023 1:15:59 PM    May 11, 2023 ECG (independently read by me): Normal sinus rhythm, LVH with repolarization abnormality.  April 27, 2022 ECG (independently read by me): Sinus bradycardia at 58, pssible LAE  September 03, 2020 ECG (independently read by me):  NSR at 74; , no  ectopy, normal intervals    January 30, 2020 ECG (independently read by me): Sinus bradycardia at 59, LAE, no ectopy  Recent Labs:    Latest Ref Rng & Units 10/12/2022    8:02 PM 03/26/2021    9:05 AM 08/03/2020    4:03 PM  BMP  Glucose 70 - 99 mg/dL 841  77  324   BUN 8 - 23 mg/dL 14  11  14    Creatinine 0.44 - 1.00 mg/dL 4.01  0.27  2.53   Sodium 135 - 145 mmol/L 138  142  141   Potassium 3.5 - 5.1 mmol/L 4.0  3.7  3.9   Chloride 98 - 111 mmol/L 105  107  108   CO2 22 - 32 mmol/L 25  28  26    Calcium 8.9 - 10.3 mg/dL 9.1  8.8  9.8         Latest Ref Rng & Units 10/12/2022    8:02 PM 03/26/2021    9:05 AM 08/03/2020    4:03 PM  Hepatic Function  Total Protein 6.5 - 8.1 g/dL 6.7  6.3  7.1   Albumin 3.5 - 5.0 g/dL 4.5  4.1  4.4   AST 15 - 41 U/L 15  26  19    ALT 0 - 44 U/L 15  48  27   Alk Phosphatase 38 - 126 U/L 49  57  46   Total Bilirubin 0.3 - 1.2 mg/dL 0.4  0.3  0.4   Bilirubin, Direct 0.0 - 0.3 mg/dL  0.1         Latest Ref Rng & Units 03/26/2021    9:05 AM 08/03/2020    4:03 PM 05/12/2020   12:37 PM  CBC  WBC 4.0 - 10.5 K/uL 4.8  7.3  6.3   Hemoglobin 12.0 - 15.0 g/dL 66.4  40.3  47.4   Hematocrit 36.0 - 46.0 % 41.2  42.3  40.2   Platelets 150.0 - 400.0 K/uL 186.0  245  195.0    Lab Results  Component Value Date   MCV 91.2 03/26/2021   MCV 96.8 08/03/2020   MCV 94.4 05/12/2020   Lab Results  Component Value Date   TSH 0.735 07/02/2011   No results found for: "HGBA1C"   BNP No results found for: "BNP"  ProBNP No results found for: "PROBNP"   Lipid Panel  No results found for: "CHOL", "TRIG", "HDL", "CHOLHDL", "VLDL", "LDLCALC", "LDLDIRECT", "LABVLDL"   RADIOLOGY: No results found.   Additional studies/ records that were reviewed today include:    ECHO: 03/04/2017 - Procedure narrative: Transthoracic echocardiography. Image    quality was adequate. The study was technically difficult.  - Left ventricle: The cavity  size was normal. Wall  thickness was    increased in a pattern of moderate LVH. Systolic function was    normal. The estimated ejection fraction was in the range of 60%    to 65%. Wall motion was normal; there were no regional wall    motion abnormalities. Doppler parameters are consistent with    abnormal left ventricular relaxation (grade 1 diastolic    dysfunction). The E/e&' ratio is between 8-15, suggesting    indeterminate LV filling pressure.  - Aortic valve: Mildly calcified leaflets. Mild stenosis. There was    trivial regurgitation. Mean gradient (S): 10 mm Hg. Peak gradient    (S): 20 mm Hg. Valve area (VTI): 1.34 cm^2. Valve area (Vmax):    1.23 cm^2.  - Left atrium: The atrium was normal in size.  - Inferior vena cava: The vessel was normal in size. The    respirophasic diameter changes were in the normal range (>= 50%),    consistent with normal central venous pressure.   Impressions:   - Technically difficult study. LVEF 60-65%, moderate LVH, normal    wall motion, grade 1 DD, indeterminate LV filling pressure, mild    aortic stenosis with trivial AI, normal LA size, normal IVC.   CTA: 05/29/2023 FINDINGS: Non-cardiac: See separate report from [facility].   The aortic root and ascending aorta are normal in caliber. Pulmonary veins drain normally to the left atrium. No LA appendage thrombus.   Calcium Score: 72.2 Agatston units   Coronary Arteries: Right dominant with no anomalies   LM: Short vessel. Calcified plaque distal LM, mild (1-24%) stenosis.   LAD system: Calcified plaque proximal LAD, minimal stenosis.   Circumflex system: Mixed plaque proximal LCx, mild (1-24%) stenosis.   RCA system: Mixed plaque proximal RCA with mild (1-24%) stenosis.   IMPRESSION: 1. Coronary artery calcium score 72.2 Agatston units. This places the patient in the 52nd percentile for age and gender, suggesting intermediate risk for future cardiac events.   2.  Mild nonobstructive  CAD.  IMPRESSION: 1.  Aortic Atherosclerosis (ICD10-I70.0). 2. Clustered subpleural nodules in the anterior right upper lobe, unchanged in 2018 exam, likely sequela of prior infection. No specific imaging follow-up is needed.   ECHO: 06/13/2023  1. Left ventricular ejection fraction, by estimation, is 60 to 65%. The  left ventricle has normal function. The left ventricle has no regional  wall motion abnormalities. Left ventricular diastolic parameters are  indeterminate.   2. Right ventricular systolic function is normal. The right ventricular  size is normal.   3. The mitral valve is myxomatous. No evidence of mitral valve  regurgitation. No evidence of mitral stenosis.   4. The aortic valve is calcified. Aortic valve regurgitation is mild to  moderate. Mild to moderate aortic valve stenosis. Aortic valve area, by  VTI measures 1.22 cm. Aortic valve mean gradient measures 11.0 mmHg.  Aortic valve Vmax measures 2.31 m/s.   5. The inferior vena cava is normal in size with greater than 50%  respiratory variability, suggesting right atrial pressure of 3 mmHg.   Comparison(s): EF 60%, moderate LVH, trivial AI, mild AS mean 10, peak 20  mmHg.     ASSESSMENT:    1. Coronary artery disease involving native coronary artery of native heart without angina pectoris   2. Essential hypertension   3. Mild to moderate nonrheumatic aortic valve stenosis   4. Chest fullness   5. Palpitations   6. Mild asthma, unspecified whether complicated  7. Multiple sclerosis (HCC)     PLAN:  Ms. Lissette Schenk is a 78 year old female with previous documentation of mild CAD as well as muscle bridging.  She has been documented as normal systolic function with grade 1 diastolic dysfunction and mild aortic stenosis.  She has multiple sclerosis as well as asthma.  In the past she had experienced some atypical chest pain and has undergone Myoview  studies with the most recent in December 2020 remaining low risk.   Remotely, she had noted some palpitations which improved with caffeine reduction as well as chocolate intake reduction.  She had undergone an echo Doppler study in 2018 which showed EF at 60 to 65% without wall motion abnormalities.  There is trivial aortic regurgitation, insignificant.  She has been followed by Dr. Richard Saito for multiple sclerosis and at times has a MS "hug" where she develops crushing pain in her chest back both arms last approximately 5 to 10 minutes and resolves.  She also may have associated esophageal spasm which has improved with isosorbide  therapy.  Since her last office visit, she underwent coronary CTA which showed mild nonobstructive plaque with calcium score 72.2.  An echo Doppler study shows normal systolic function with EF 60 to 65% with mild to moderate aortic valve stenosis with a mean gradient of 11 and peak gradient of 21.3.  His blood pressure today is stable and on repeat by me was 136/74 on a regimen of diltiazem  to 40 mg, isosorbide  90 mg, losartan  75 mg daily.  She continues to be on Repatha for aggressive lipid lowering therapy.  She is on pantoprazole  for GERD and takes Symbicort  and albuterol  for asthma.  She will be undergoing repeat laboratory by Dr. Imelda Man in several months.  I have recommended that at that time she also have lipoprotein a assessment.  She is aware of my upcoming retirement in several months.  I will transition her to the care of Dr. Janeece Mechanic at our [facility] office and plan for initial evaluation in 6 to 8 months or sooner as needed.   Medication Adjustments/Labs and Tests Ordered: Current medicines are reviewed at length with the patient today.  Concerns regarding medicines are outlined above.  Medication changes, Labs and Tests ordered today are listed in the Patient Instructions below. Patient Instructions  Medication Instructions: Your physician recommends that you continue on your current medications as directed. Please  refer to the Current Medication list given to you today.    *If you need a refill on your cardiac medications before your next appointment, please call your pharmacy*   Lab Work:  NONE   If you have labs (blood work) drawn today and your tests are completely normal, you will receive your results only by: MyChart Message (if you have MyChart) OR A paper copy in the mail If you have any lab test that is abnormal or we need to change your treatment, we will call you to review the results.   Testing/Procedures: NONE    Follow-Up: At [facility], you and your health needs are our priority.  As part of our continuing mission to provide you with exceptional heart care, we have created designated Provider Care Teams.  These Care Teams include your primary Cardiologist (physician) and Advanced Practice Providers (APPs -  Physician Assistants and Nurse Practitioners) who all work together to provide you with the care you need, when you need it.  We recommend signing up for the patient portal called "MyChart".  Sign up  information is provided on this After Visit Summary.  MyChart is used to connect with patients for Virtual Visits (Telemedicine).  Patients are able to view lab/test results, encounter notes, upcoming appointments, etc.  Non-urgent messages can be sent to your provider as well.   To learn more about what you can do with MyChart, go to [email or web address].    Your next appointment:   6-8 month(s)  The format for your next appointment:   In Person: [facility]   Provider:   Sheryle Donning, MD    Other Instructions      Signed, Magnus Schuller, MD  07/20/2023 1:38 PM    [facility] [address], [address], [place], [place]  [address] Phone: [phone]

## 2023-07-21 ENCOUNTER — Encounter: Payer: Self-pay | Admitting: Cardiovascular Disease

## 2023-07-24 DIAGNOSIS — D225 Melanocytic nevi of trunk: Secondary | ICD-10-CM | POA: Diagnosis not present

## 2023-07-24 DIAGNOSIS — L820 Inflamed seborrheic keratosis: Secondary | ICD-10-CM | POA: Diagnosis not present

## 2023-07-24 DIAGNOSIS — L821 Other seborrheic keratosis: Secondary | ICD-10-CM | POA: Diagnosis not present

## 2023-07-24 DIAGNOSIS — D692 Other nonthrombocytopenic purpura: Secondary | ICD-10-CM | POA: Diagnosis not present

## 2023-07-24 DIAGNOSIS — L814 Other melanin hyperpigmentation: Secondary | ICD-10-CM | POA: Diagnosis not present

## 2023-07-31 ENCOUNTER — Other Ambulatory Visit: Payer: Self-pay | Admitting: Internal Medicine

## 2023-08-01 ENCOUNTER — Other Ambulatory Visit: Payer: Self-pay | Admitting: Internal Medicine

## 2023-08-10 ENCOUNTER — Ambulatory Visit: Payer: Medicare PPO | Admitting: Neurology

## 2023-08-10 ENCOUNTER — Encounter: Payer: Self-pay | Admitting: Neurology

## 2023-08-10 VITALS — BP 134/72 | HR 75 | Ht 63.0 in | Wt 150.0 lb

## 2023-08-10 DIAGNOSIS — G35 Multiple sclerosis: Secondary | ICD-10-CM

## 2023-08-10 DIAGNOSIS — R9082 White matter disease, unspecified: Secondary | ICD-10-CM | POA: Diagnosis not present

## 2023-08-10 DIAGNOSIS — R3915 Urgency of urination: Secondary | ICD-10-CM

## 2023-08-10 DIAGNOSIS — R208 Other disturbances of skin sensation: Secondary | ICD-10-CM

## 2023-08-10 DIAGNOSIS — R269 Unspecified abnormalities of gait and mobility: Secondary | ICD-10-CM

## 2023-08-10 DIAGNOSIS — R252 Cramp and spasm: Secondary | ICD-10-CM | POA: Diagnosis not present

## 2023-08-10 NOTE — Progress Notes (Signed)
 [facility]  PATIENT: [name] DOB: 01/30/1946  REFERRING DOCTOR OR PCP: Robyn Moselle FNP SOURCE: Patient, notes from [facility] neurology (Dr. Susana [name]), imaging and lab reports, MRI images personally reviewed.  _________________________________   HISTORICAL  CHIEF COMPLAINT:  Chief Complaint  Patient presents with   Follow-up    Pt in room 11 alone. Here for MS follow. No recent fall, last eye exam was in Oct. Pt reports leg pain at night.     HISTORY OF PRESENT ILLNESS:  [name] is a 78 year old woman who was diagnosed with MS  UPDATE 08/10/2023 She notes more MS hug sensation than last year.   We had written lamotrigine  but she never took it.  She had previously been on gabapentin  and carbamazepine for the dysesthesias without much benefit.  She also notes leg pain and RLS like symptoms at night.     She sometimes feels she has to move her legs to be comfortable.    She generally walks well but has tripping at times when her toes catch.   She does not need a cane.  She could walk > 1/4 mile without support r a rest, sometimes more.     She has urinary urgency and some incontinence, usually leakage and she uses pads, worse at night.     Vision is reduced.  She has trouble readig even with reading glasses but distance vision is off.   Her left eye is worse than her right.    She has some neck pain and reduced ROM in neck.  This is better today than earlier in the week.     She reports fatigue.   She sleeps fair most nights, reduced due to leg pain.  She averages 6 hours.    She denies depression.    Cognition is usually fine.   She occasioanlly has trouble finding words.       MS history: [name] was diagnosed with MS in 2000 after presenting with sensation of not feeling right.   She had an MRI done showing white matter lesions.   She also had some foot weakness at  the time.   She also had an LP that was reportedly negative.   She  was placed initially in a drug study (not sure which one but it was a sq injectable one).   She then was placed on Copaxone for several years and then went off of all DMTs.     She began to experience a squeezing sensation intermittently lasting 15 minues at a time a few times a day for several days    She was started on Ocrevus  in 2017 due to these sensory symptoms.      She reports stopping in late 2018 or 2019 due to two pneumonias.    She has been off any DMT since.    Imaging: MRI of the brain 12/12/2018 shows stable confluent T2/FLAIR hyperintense foci predominantly in the subcortical and deep white matter.  There were no foci in the infratentorial white matter.  A couple small foci are noted in the thalamus.  None of the foci enhance or appear to be acute.  The scan was unchanged compared to the 08/18/2017 MRI.  MRI of the cervical spine 04/06/2016 shows a normal spinal cord.  There are degenerative changes from C3-C4 through C6-C7 Causing mild spinal stenosis.  At C3-C4, there is left greater than right foraminal narrowing with potential for left C4 nerve root compression.  At C4-C5, there is right greater than left foraminal narrowing with potential for compression of the right C5 nerve roots.  At C5-C6, there is right greater than left foraminal narrowing with potential for right C6 nerve root compression.  At C6-C7, there is bilateral foraminal narrowing though there did not appear to be nerve root compression.  REVIEW OF SYSTEMS: Constitutional: No fevers, chills, sweats, or change in appetite Eyes: No visual changes, double vision, eye pain Ear, nose and throat: No hearing loss, ear pain, nasal congestion, sore throat Cardiovascular: No chest pain, palpitations Respiratory:  No shortness of breath at rest or with exertion.   No wheezes GastrointestinaI: No nausea, vomiting, diarrhea, abdominal pain, fecal incontinence Genitourinary:  No dysuria, urinary retention or frequency.  No  nocturia. Musculoskeletal:  No neck pain, back pain Integumentary: No rash, pruritus, skin lesions Neurological: as above Psychiatric: No depression at this time.  No anxiety Endocrine: No palpitations, diaphoresis, change in appetite, change in weigh or increased thirst Hematologic/Lymphatic:  No anemia, purpura, petechiae. Allergic/Immunologic: No itchy/runny eyes, nasal congestion, recent allergic reactions, rashes  ALLERGIES: Allergies  Allergen Reactions   Crestor [Rosuvastatin] Other (See Comments)    Joint pain    Iodinated Contrast Media Other (See Comments)    Sneezing and itchy throat; dye was Isovue  300   Vicodin [Hydrocodone -Acetaminophen ]     Made pass-out one time and is okay taking now   Isovue  [Iopamidol ]     Pt had sneezing and itching of her throat and soft palate.  Dr [name] checked pt.  She will need premeds in the future.  [name]    HOME MEDICATIONS:  Current Outpatient Medications:    albuterol  (VENTOLIN  HFA) 108 (90 Base) MCG/ACT inhaler, Inhale 2 puffs into the lungs [name] 6 (six) hours as needed for wheezing or shortness of breath., Disp: 8.5 g, Rfl: 2   baclofen  (LIORESAL ) 10 MG tablet, Take 1/2 to 1 pill [name] night and 1 pill po qd prn spasticity, Disp: 180 each, Rfl: 4   budesonide -formoterol  (SYMBICORT ) 80-4.5 MCG/ACT inhaler, INHALE TWO PUFFS INTO THE LUNGS [name] MORNING AND AT BEDTIME, Disp: 10.2 g, Rfl: 0   Calcium Acetate, Phos Binder, (CALCIUM ACETATE PO), Take by mouth., Disp: , Rfl:    Cholecalciferol (VITAMIN D PO), Take 1 tablet by mouth daily., Disp: , Rfl:    denosumab  (PROLIA ) 60 MG/ML SOSY injection, 60 mg, Disp: , Rfl:    diltiazem  (CARDIZEM  CD) 240 MG 24 hr capsule, Take 1 capsule (240 mg total) by mouth daily., Disp: 90 capsule, Rfl: 3   EPINEPHRINE  0.3 mg/0.3 mL IJ SOAJ injection, USE AS DIRECTED, Disp: 2 each, Rfl: 3   Evolocumab (REPATHA) 140 MG/ML SOSY, Inject into the skin., Disp: , Rfl:    ipratropium-albuterol  (DUONEB) 0.5-2.5 (3)  MG/3ML SOLN, USE 1 VIAL ( ) VIA NEBULIZER TWICE DAILY., Disp: 360 mL, Rfl: 0   isosorbide  mononitrate (IMDUR ) 60 MG 24 hr tablet, TAKE 1 & 1/2 TABLETS ONCE DAILY, Disp: 135 tablet, Rfl: 3   losartan  (COZAAR ) 50 MG tablet, TAKE 1 & 1/2 TABLETS ONCE DAILY, Disp: 135 tablet, Rfl: 3   Multiple Vitamin (MULTIVITAMIN ADULT PO), Take by mouth., Disp: , Rfl:    nitroGLYCERIN  (NITROSTAT ) 0.4 MG SL tablet, PLACE 1 TABLET UNDER THE TONGUE AS NEEDED FOR CHEST PAIN, MAY REPEAT [name] 5 MINUTES., Disp: 25 tablet, Rfl: 3   pantoprazole  (PROTONIX ) 20 MG tablet, TAKE ONE TABLET BY MOUTH TWICE DAILY, Disp: 60 tablet, Rfl: 1   PARoxetine  (PAXIL ) 10  MG tablet, Take 5 mg by mouth daily. , Disp: , Rfl:    Respiratory Therapy Supplies (FLUTTER) DEVI, Use as directed, Disp: 1 each, Rfl: 0   lamoTRIgine  (LAMICTAL ) 25 MG tablet, For 5 days take one pill a day then take one pill po bid (Patient not taking: Reported on 08/10/2023), Disp: 60 tablet, Rfl: 11   metoprolol  tartrate (LOPRESSOR ) 25 MG tablet, Take 2 hours before CT scan. (Patient not taking: Reported on 08/10/2023), Disp: 1 tablet, Rfl: 0  PAST MEDICAL HISTORY: Past Medical History:  Diagnosis Date   Anemia    past history-many yrs ago   Anginal pain (HCC)    being evaluated by Dr. Kee Pastel, arm pain,"bad indigestion" -no heart related findings as of yet   Arthritis    hip. back pain   Asthma    Complication of anesthesia    s/p Hysterectomy "vagal response "heart stopped" -did not require shocking.   Coronary artery disease    Dry eyes    Esophageal spasm    GERD (gastroesophageal reflux disease)    MS (multiple sclerosis) (HCC)    stable-sees [name] [name] 6 months    PAST SURGICAL HISTORY: Past Surgical History:  Procedure Laterality Date   ABDOMINAL HYSTERECTOMY     BLEPHAROPLASTY Bilateral    CARDIAC CATHETERIZATION  10/04/06   MINOR CAD,SINGLE VESSEL INVOLVING THE CIRCUMFLEX. 20 TO 30% PROXIMALLY AND 10 TO 20% IN THE MIDDLE SEGMENT.MILD  MUSCLE BRIDGING, MID LAD.NORMAL RCA.NORMAL LV FUNCTION.NORMAL MITRAL AND AORTIC VALVE.NORMAL APPEARING AORTA,THORACIC AND ABDOMINAL.NORMAL RENAL ARTERIES.   CARDIOLOGY NUCLEAR MED STUDY  06/22/12   NL LV FUNCTION,EF 68%,NL WALL MOTION.   CAROTID DUPLEX  07/02/11   MWU:XLKG SOFT PLAQUE NOTED DISTAL CCA AND ORGIN AND PROXIMAL ICA,LEFT>RIGHT.NO ICA STENOSIS. VERTEBRAL ARTERY FLOW IS ANTEGRADE.   CESAREAN SECTION     x2    COLONOSCOPY WITH PROPOFOL  N/A 12/11/2014   Procedure: COLONOSCOPY WITH PROPOFOL ;  Surgeon: Lanita Pitman, MD;  Location: [facility];  Service: Endoscopy;  Laterality: N/A;   KNEE ARTHROSCOPY Left    scope   PARATHYROIDECTOMY     partial-many years ago   thumb surgery Bilateral    built up and bone removal   TONSILLECTOMY     TRANSTHORACIC ECHOCARDIOGRAM  07/02/11   LV CAVITY SIZE IS NORMAL. SYSTOLIC FUNCTION WAS NORMAL.EF=55% TO 60%.INCREASED RELATIVE CONTRIBUTION OF ATRIAL CONTRACTION TO VENTRICULAR FILLING;MAYBE DUE TO HYPOVOLEMIA. AV=MILD REGURG.    FAMILY HISTORY: Family History  Problem Relation Age of Onset   Heart disease Mother    Asthma Father    Bladder Cancer Father    Heart failure Father    Hyperlipidemia Brother     SOCIAL HISTORY:  Social History   Socioeconomic History   Marital status: Divorced    Spouse name: Not on file   Number of children: 3   Years of education: Not on file   Highest education level: Master's degree (e.g., MA, MS, MEng, MEd, MSW, MBA)  Occupational History   Occupation: retired  Tobacco Use   Smoking status: Never   Smokeless tobacco: Never  Vaping Use   Vaping status: Never Used  Substance and Sexual Activity   Alcohol use: Yes    Alcohol/week: 0.0 standard drinks of alcohol    Comment: wine occ.   Drug use: No   Sexual activity: Not on file  Other Topics Concern   Not on file  Social History Narrative   Lives alone and new puppy   R handed   Caffeine:  16oz a day   Social Drivers of Research scientist (physical sciences) Strain: Not on file  Food Insecurity: Not on file  Transportation Needs: Not on file  Physical Activity: Not on file  Stress: Not on file  Social Connections: Not on file  Intimate Partner Violence: Not on file     PHYSICAL EXAM  Vitals:   08/10/23 1138  BP: 134/72  Pulse: 75  Weight: 150 lb (68 kg)  Height: 5\' 3"  (1.6 m)     Body mass index is 26.57 kg/m.   General: The patient is well-developed and well-nourished and in no acute distress  HEENT:  Head is [place]/AT.  Sclera are anicteric.     Skin: Extremities are without rash or  edema.  Neurologic Exam  Mental status: The patient is alert and oriented x 3 at the time of the examination. The patient has apparent normal recent and remote memory, with an apparently normal attention span and concentration ability.   Speech is normal.  Cranial nerves: Extraocular movements are full. Facial strength is normal. . No obvious hearing deficits are noted.  Motor:  Muscle bulk is normal.   Tone is normal. Strength is  5 / 5 in all 4 extremities.   Sensory: Sensory testing is intact to pinprick, soft touch and vibration sensation in all 4 extremities.  Coordination: Cerebellar testing reveals good finger-nose-finger and heel-to-shin bilaterally.  Gait and station: Station is normal.   Gait is arthritic and minimally wide Tandem gait is mildly wide  Romberg is negative.   Reflexes: Deep tendon reflexes are symmetric and normal in the arms and increased at the knees.  Ankle reflexes are normal..   Plantar responses are flexor.    DIAGNOSTIC DATA (LABS, IMAGING, TESTING) - I reviewed patient records, labs, notes, testing and imaging myself where available.  Lab Results  Component Value Date   WBC 4.8 03/26/2021   HGB 13.4 03/26/2021   HCT 41.2 03/26/2021   MCV 91.2 03/26/2021   PLT 186.0 03/26/2021      Component Value Date/Time   NA 138 10/12/2022 2002   K 4.0 10/12/2022 2002   CL 105 10/12/2022 2002   CO2 25  10/12/2022 2002   GLUCOSE 107 (H) 10/12/2022 2002   BUN 14 10/12/2022 2002   CREATININE 0.70 10/12/2022 2002   CALCIUM 9.1 10/12/2022 2002   PROT 6.7 10/12/2022 2002   ALBUMIN 4.5 10/12/2022 2002   AST 15 10/12/2022 2002   ALT 15 10/12/2022 2002   ALKPHOS 49 10/12/2022 2002   BILITOT 0.4 10/12/2022 2002   GFRNONAA >60 10/12/2022 2002   GFRAA >60 03/25/2018 1701   Lab Results  Component Value Date   TSH 0.735 07/02/2011       ASSESSMENT AND PLAN  Multiple sclerosis (HCC)  Dysesthesia  Urgency of urination  Gait disturbance  Muscle cramps  White matter abnormality on MRI of brain   Her symptoms are mostly stable.  She will remain off a DMT for diagnosis of MS (MRI's However, are nonspecific) She still has dysesthesia and some leg spasms.  Continue baclofen .   Lamotrigine  low dose 25 mg po bid and titrate up if needed Consider an anticholinergic if bladder issues worsen. Consider ropinirole for RLS if I worsens RTC 1 year  This visit is part of a comprehensive longitudinal care medical relationship regarding the patients primary diagnosis of MS and related concerns.   Anzley Dibbern A. Godwin Lat, MD, [facility] 08/10/2023, 12:00 PM Certified in Neurology, Clinical Neurophysiology,  Sleep Medicine and Neuroimaging  [facility] [address], [address] [place], [place] [address] [phone]

## 2023-08-22 DIAGNOSIS — Z961 Presence of intraocular lens: Secondary | ICD-10-CM | POA: Diagnosis not present

## 2023-08-22 DIAGNOSIS — H0102B Squamous blepharitis left eye, upper and lower eyelids: Secondary | ICD-10-CM | POA: Diagnosis not present

## 2023-08-22 DIAGNOSIS — H0102A Squamous blepharitis right eye, upper and lower eyelids: Secondary | ICD-10-CM | POA: Diagnosis not present

## 2023-08-22 DIAGNOSIS — H0288A Meibomian gland dysfunction right eye, upper and lower eyelids: Secondary | ICD-10-CM | POA: Diagnosis not present

## 2023-08-22 DIAGNOSIS — H00021 Hordeolum internum right upper eyelid: Secondary | ICD-10-CM | POA: Diagnosis not present

## 2023-08-22 DIAGNOSIS — H0288B Meibomian gland dysfunction left eye, upper and lower eyelids: Secondary | ICD-10-CM | POA: Diagnosis not present

## 2023-09-12 DIAGNOSIS — Z961 Presence of intraocular lens: Secondary | ICD-10-CM | POA: Diagnosis not present

## 2023-09-12 DIAGNOSIS — H0288B Meibomian gland dysfunction left eye, upper and lower eyelids: Secondary | ICD-10-CM | POA: Diagnosis not present

## 2023-09-12 DIAGNOSIS — H0102A Squamous blepharitis right eye, upper and lower eyelids: Secondary | ICD-10-CM | POA: Diagnosis not present

## 2023-09-12 DIAGNOSIS — H43391 Other vitreous opacities, right eye: Secondary | ICD-10-CM | POA: Diagnosis not present

## 2023-09-12 DIAGNOSIS — H0102B Squamous blepharitis left eye, upper and lower eyelids: Secondary | ICD-10-CM | POA: Diagnosis not present

## 2023-09-12 DIAGNOSIS — H00021 Hordeolum internum right upper eyelid: Secondary | ICD-10-CM | POA: Diagnosis not present

## 2023-09-12 DIAGNOSIS — H0288A Meibomian gland dysfunction right eye, upper and lower eyelids: Secondary | ICD-10-CM | POA: Diagnosis not present

## 2023-10-05 DIAGNOSIS — E039 Hypothyroidism, unspecified: Secondary | ICD-10-CM | POA: Diagnosis not present

## 2023-10-05 DIAGNOSIS — M810 Age-related osteoporosis without current pathological fracture: Secondary | ICD-10-CM | POA: Diagnosis not present

## 2023-10-05 DIAGNOSIS — I10 Essential (primary) hypertension: Secondary | ICD-10-CM | POA: Diagnosis not present

## 2023-10-05 LAB — LAB REPORT - SCANNED
EGFR: 70
TSH: 3.38 (ref 0.41–5.90)

## 2023-10-10 DIAGNOSIS — I10 Essential (primary) hypertension: Secondary | ICD-10-CM | POA: Diagnosis not present

## 2023-10-10 DIAGNOSIS — I2510 Atherosclerotic heart disease of native coronary artery without angina pectoris: Secondary | ICD-10-CM | POA: Diagnosis not present

## 2023-10-10 DIAGNOSIS — M810 Age-related osteoporosis without current pathological fracture: Secondary | ICD-10-CM | POA: Diagnosis not present

## 2023-10-10 DIAGNOSIS — K224 Dyskinesia of esophagus: Secondary | ICD-10-CM | POA: Diagnosis not present

## 2023-10-10 DIAGNOSIS — J8283 Eosinophilic asthma: Secondary | ICD-10-CM | POA: Diagnosis not present

## 2023-10-10 DIAGNOSIS — E559 Vitamin D deficiency, unspecified: Secondary | ICD-10-CM | POA: Diagnosis not present

## 2023-10-10 DIAGNOSIS — E039 Hypothyroidism, unspecified: Secondary | ICD-10-CM | POA: Diagnosis not present

## 2023-10-10 DIAGNOSIS — G35 Multiple sclerosis: Secondary | ICD-10-CM | POA: Diagnosis not present

## 2023-10-10 DIAGNOSIS — Z0000 Encounter for general adult medical examination without abnormal findings: Secondary | ICD-10-CM | POA: Diagnosis not present

## 2023-10-11 ENCOUNTER — Other Ambulatory Visit: Payer: Self-pay | Admitting: Internal Medicine

## 2023-10-11 ENCOUNTER — Ambulatory Visit: Payer: Self-pay

## 2023-10-11 NOTE — Telephone Encounter (Signed)
 NFN

## 2023-10-11 NOTE — Telephone Encounter (Addendum)
 FYI Only or Action Required?: FYI only for provider.  Patient is followed in Pulmonology for asthma, last seen on 11/04/2022 by Geronimo Amel, MD.  Called Nurse Triage reporting Cough.  Symptoms began about a month ago.  Symptoms are: gradually improving.  Triage Disposition: See PCP When Office is Open (Within 3 Days)  Patient/caregiver understands and will follow disposition?: Yes   Patient scheduled on 7/23 per her request to see Dr. Geronimo only.     Copied from CRM [id number]. Topic: Clinical - Red Word Triage >> Oct 11, 2023 10:03 AM [place] KIDD wrote: [facility] that prompted transfer to Nurse Triage: dr geronimo is patient doctor has a bad persistent cough patient called last week and they told her she would receive a call never received call so I'm doing it as a red word     Reason for Disposition  Cough has been present for > 3 weeks  Answer Assessment - Initial Assessment Questions 1. ONSET: When did the cough begin?      1 month 2. SEVERITY: How bad is the cough today?      Moderate  3. SPUTUM: Describe the color of your sputum (e.g., none, dry cough; clear, white, yellow, green)     No 4. HEMOPTYSIS: Are you coughing up any blood? If Yes, ask: How much? (e.g., flecks, streaks, tablespoons, etc.)     No 5. DIFFICULTY BREATHING: Are you having difficulty breathing? If Yes, ask: How bad is it? (e.g., mild, moderate, severe)      Mild but believes due to the heat 6. FEVER: Do you have a fever? If Yes, ask: What is your temperature, how was it measured, and when did it start?     No 7. CARDIAC HISTORY: Do you have any history of heart disease? (e.g., heart attack, congestive heart failure)      Yes 8. LUNG HISTORY: Do you have any history of lung disease?  (e.g., pulmonary embolus, asthma, emphysema)     Yes 9. PE RISK FACTORS: Do you have a history of blood clots? (or: recent major surgery, recent prolonged travel, bedridden)     No 10.  OTHER SYMPTOMS: Do you have any other symptoms? (e.g., runny nose, wheezing, chest pain)       No  Protocols used: Cough - Acute Non-Productive-A-AH

## 2023-10-16 ENCOUNTER — Other Ambulatory Visit: Payer: Self-pay | Admitting: Neurology

## 2023-10-16 NOTE — Telephone Encounter (Signed)
 Last seen on 08/10/23 Follow up scheduled on 08/08/24

## 2023-10-18 ENCOUNTER — Encounter: Payer: Self-pay | Admitting: Internal Medicine

## 2023-10-18 ENCOUNTER — Ambulatory Visit: Admitting: Internal Medicine

## 2023-10-18 VITALS — BP 126/74 | HR 76 | Ht 63.0 in | Wt 148.0 lb

## 2023-10-18 DIAGNOSIS — Z8616 Personal history of COVID-19: Secondary | ICD-10-CM

## 2023-10-18 DIAGNOSIS — J4541 Moderate persistent asthma with (acute) exacerbation: Secondary | ICD-10-CM | POA: Diagnosis not present

## 2023-10-18 DIAGNOSIS — J8283 Eosinophilic asthma: Secondary | ICD-10-CM | POA: Diagnosis not present

## 2023-10-18 LAB — NITRIC OXIDE: Nitric Oxide: 42

## 2023-10-18 MED ORDER — NYSTATIN 100000 UNIT/ML MT SUSP: OROMUCOSAL | 0 refills | Status: AC

## 2023-10-18 MED ORDER — PREDNISONE 10 MG PO TABS: ORAL_TABLET | ORAL | 0 refills | Status: AC

## 2023-10-18 NOTE — Progress Notes (Signed)
 OV 11/30/2017  Subjective:  Patient ID: [name], female , DOB: [birth year] , age 78 y.o. , MRN: [id number] , ADDRESS: [address] [place] [facility] [address]   11/30/2017 -   Chief Complaint  Patient presents with   Follow-up    Pt states she has been doing good since last visit except states she has had a headache x2 days and has had a cough. Pt also has c/o chest tightness.     HPI JAIDEE STIPE 78 y.o. -follow-up severe persistent asthma with poorly controlled symptoms. She is on Dulera, Singulair  and biologic Nucala . She reports that currently it is one of her better days and weeks. Despite that asthma control questionnaire shows significant symptom with the 5 point score of 1.8. She says she is not waking up in the middle of the night because of asthma when she wakes up she is very mild symptoms when she is very slightly limited in her activities and she is moderately short of breath and wheezing a lot of the time but not using much albuterol  for rescue. This is despite compliance. Last visit we switched her to Dupixent  but this is stuck with insurance pre-authorization required and that is some delays with this. In addition she also tells me that at every visit she's been giving sputum samples at our lab basementand she is unclear why no one called her with results. I have told her that I was unaware that this was going on. Review of the charts indicate sputum samples given October 2018 in April 2019. Along and consistent with a respiratory in asthma symptoms at that early morning she has cough with yellow sputum which is consistent with high airway eosinophil load as evidenced by significantly high exhaled nitric oxide  is despite biologic therapy.  SABRA  ROS - per HPI       OV 03/01/2018  Subjective:  Patient ID: [name], female , DOB: Apr 24, 1945 , age 25 y.o. , MRN: [id number] , ADDRESS: [address] [place] [facility] [address]   03/01/2018 -   Chief Complaint   Patient presents with   Follow-up    f/u asthma, no wheezing since dupixent      HPI [name] 78 y.o. -  Follow-up severe eosinophilic asthma on Dupixent .  She is taking the shots in her arm every 2 weeks.  This is been going on for 8 weeks or so.  She reports excellent improvement in her asthma.  Asthma control questionnaire 0 out of 5.  Significant improvement in her exam nitric oxide  today going from the mid 100s to 43.  She continues to be on Dulera and Singulair .  She is asking if she can de-escalate any of this therapy.  She is not waking up in the middle of the night with asthma.  She not having any symptoms when she wakes up.  Not limited in her activities.  When she wakes up she not short of breath.  No wheezing.  She not using albuterol  for rescue.  She thinks she might be getting a cold but she is not sure.  Nitric oxide  and symptom profile shows huge improvements.  However she is wondering if she is having side effects from the dupilumab .  She is reporting some blurred vision and needing to use reading glasses more so after starting the dupilumab .  This is temporally correlated.  Review of the literature shows conjunctivitis and pruritus reported at low percentages but not blurred  vision.  OV 05/31/2018  Subjective:  Patient ID: [name], female , DOB: April 01, 1945 , age 64 y.o. , MRN: [id number] , ADDRESS: [address] [place] [facility] [address]   05/31/2018 -   Chief Complaint  Patient presents with   Follow-up    Pt states she has been doing okay since last visit and denies any current complaints of cough, SOB, or CP     HPI ALITZA COWMAN 78 y.o. -presents for follow-up.  She has severe asthma with eosinophilia.  Finally she is on Dupixent /in December 2019 she had asthma exacerbation and ended up in the ER and needed prednisone  but after that she is been doing well.  Asthma control question is 0.  She not waking up in the middle of the night with symptoms when she wakes  up she is asymptomatic.  No limitations in activities no albuterol  rescue use.  No wheezing no shortness of breath no cough.  Asthma control question is 0.  Exam nitric oxide  is further improved to 22.  However she wants to stop the Dupixent  because she feels she is getting a brain fog although she states that it could be related to MS.  She also wants to start her MS medications and is worried about the effects of polypharmacy.  She also discussed because of the upcoming coronavirus 19 pandemic.           OV 10/14/2019  Subjective:  Patient ID: [name], female , DOB: 1945-08-22 , age 31 y.o. , MRN: [id number] , ADDRESS: [address] [place] [facility] [address]   10/14/2019 -   Chief Complaint  Patient presents with   Follow-up    Eosinophilic moderate to severe persistent asthma HPI [name] 78 y.o. -presents for follow-up.  I last saw her March 2020.  Then with the pandemic I did not see her.  She has been coming to the office and getting her Dupixent  twice weekly.  She is on the higher dose.  She is not using any inhaled steroid.  She wants to know if she can reduce the dose of the Dupixent .  She is also on Zyrtec  and Singulair .  She wants to know if she can stop this.  Asthma is completely under control and she is asymptomatic.        ROS  OV 12/14/2020  Subjective:  Patient ID: [name], female , DOB: 12-May-1945 , age 45 y.o. , MRN: [id number] , ADDRESS: [address] [place] [address] PCP Clarice Nottingham, MD Patient Care Team: Clarice Nottingham, MD as PCP - General (Internal Medicine) Burnard Debby LABOR, MD as PCP - Cardiology (Cardiology)  This Provider for this visit: Treatment Team:  Attending Provider: Geronimo Amel, MD    12/14/2020 -   Chief Complaint  Patient presents with   Follow-up    Pt states she has been doing okay since last visit. States she has had an occ dry cough which lasted about 10 days but then went away.    Eosinophilic  moderate to severe persistent asthma  HPI [name] 78 y.o. -last sseen by me July 2021 and then NP in OCt 2021. Asthma under excellent control based on ACT and feno. If she cuts grass or trimes bushes she gets cough. Has randon cough episodes occasionally. Otherwise well . Currentlyt on dupixent  alone as single drug for asthma.  Discussed high dose flu shot and she will have it. Discussed covid mRNA bivalent booster -r ecommended she  have it. Never had covid before     Asthma Control Test ACT Total Score  12/14/2020 25  01/14/2020 24     Lab Results  Component Value Date   NITRICOXIDE 25 12/14/2020     OV 07/21/2021  Subjective:  Patient ID: [name], female , DOB: [birth year] , age 9 y.o. , MRN: [id number] , ADDRESS: [address] [place] [facility] [address] PCP Clarice Nottingham, MD Patient Care Team: Clarice Nottingham, MD as PCP - General (Internal Medicine) Burnard Debby LABOR, MD as PCP - Cardiology (Cardiology)  This Provider for this visit: Treatment Team:  Attending Provider: Geronimo Amel, MD    07/21/2021 -   Chief Complaint  Patient presents with   Follow-up    Pt states she has been doing good since last visit and denies any complaints. States that she has not had any recent flare ups.     HPI TYRISHA BENNINGER 78 y.o. -follows up eosinophilic asthma.  She is on Dupixent  low-dose.  She is also now on Symbicort .  She says she is doing really well.  Asthma control test questionnaire is normal.  Exam nitric oxide  test is normal.  She wants to stop taking Dupixent .  This worked really well for her but she feels that she is under remission.  She feels her daughter Lauraine might be upset if she stopped the Dupixent .  However we discussed this.  She has been on low-dose for a year.  We think we can take an expectant approach of stopping Dupixent  and see how she does.  She is open to this.  And therefore we took decision to stop Dupixent  but continue  Symbicort .   ACT  Asthma Control Test ACT Total Score  07/21/2021 11:34 AM 25  12/14/2020  1:50 PM 25  01/14/2020 10:02 AM 24    Lab Results  Component Value Date   NITRICOXIDE 18 07/21/2021       OV 11/08/2021  Subjective:  Patient ID: [name], female , DOB: [birth year] , age 51 y.o. , MRN: [id number] , ADDRESS: [address] [place] [facility] [address] PCP Clarice Nottingham, MD Patient Care Team: Clarice Nottingham, MD as PCP - General (Internal Medicine) Burnard Debby LABOR, MD as PCP - Cardiology (Cardiology)  This Provider for this visit: Treatment Team:  Attending Provider: Geronimo Amel, MD    11/08/2021 -   Chief Complaint  Patient presents with   Follow-up    Pt states she has been doing okay since last visit and denies any complaints.     HPI ADREAM PARZYCH 78 y.o. -returns for follow-up.  She has eosinophilic asthma.  In April 2023 after years of being on Dupixent  we stopped it.  She continues to do well.  Her exhaled nitric oxide  is within normal range/slightly gray zone 31.  Otherwise feeling good.  She is up-to-date with her vaccines.  She wanted to know about RSV vaccine.  She is going to think about this.  She wanted know about COVID mRNA vaccine booster.  We discussed about the current strains in the community.  We took a shared decision making to evaluate the strains in the community for COVID and then decide on the COVID mRNA booster.  She is going to wait on the literature on RSV vaccine.  She will have the high-dose flu shot.  At this point in time we took a shared decision making to monitor her off Dupixent  because asthma control is very good.  OV 01/28/2022  Subjective:  Patient ID: [name], female , DOB: [birth year] , age 79 y.o. , MRN: [id number] , ADDRESS: [address] [place] [facility] [address] PCP Clarice Nottingham, MD Patient Care Team: Clarice Nottingham, MD as PCP - General (Internal Medicine) Burnard Debby LABOR, MD as PCP - Cardiology  (Cardiology)  This Provider for this visit: Treatment Team:  Attending Provider: Geronimo Amel, MD    01/28/2022 -   Chief Complaint  Patient presents with   Follow-up    Pt states she has been doing okay since last visit and denies any complaints.     HPI CHEYENNA PANKOWSKI 78 y.o. -returns for follow-up.  Since I last saw her she had 1 episode of pneumonia.  Apparently x-ray was done by primary care physician.  She did see Dr. Slater Staff acutely in our office in August 2023.  And was given 7-day Levaquin  and prednisone .  She followed up with nurse practitioner 12/14/2021 and the chest x-ray did not show pneumonia.  Currently doing well.  Asthma control test questionnaire shows excellent asthma control.  She is here with her daughter Lauraine.  They are wondering if she should restart her Dupixent .  We discussed this in detail.  I certainly said that we could restart it now.  Alternatively we could follow closely with good close follow-up and if there is another respiratory exacerbation definitely before spring or summer 2024 then that should be low threshold definitely start Dupixent  again.  We can even do low-dose Dupixent .  They are okay with this plan and will stay in touch.  We also discussed respiratory vaccines.  I recommended RSV and the COVID mRNA booster.     Asthma Control Test ACT Total Score  01/28/2022  3:23 PM 24  11/08/2021  1:53 PM 24  07/21/2021 11:34 AM 25    OV 11/04/2022  Subjective:  Patient ID: [name], female , DOB: [birth year] , age 60 y.o. , MRN: [id number] , ADDRESS: [address] [place] [facility] [address] PCP Clarice Nottingham, MD Patient Care Team: Clarice Nottingham, MD as PCP - General (Internal Medicine) Burnard Debby LABOR, MD as PCP - Cardiology (Cardiology)  This Provider for this visit: Treatment Team:  Attending Provider: Geronimo Amel, MD    11/04/2022 -   Chief Complaint  Patient presents with   Follow-up    F/up on asthma      HPI [name] 78 y.o. -presents for her asthma follow-up.  Her Joycie continues to be range bound.  However 10/12/2022 she suffered from COVID-19 with sore throat.  And then she took Paxlovid  and started getting better but by October 21, 2022 October 22, 2022 she had rebound and finally resolved all her symptoms by October 26, 2022 although she is left with a new onset laryngospasm.  She has happened 5 times.  It happened in restaurants and 1 time in the art studio when she swallows saliva.  She said she nearly called EMS.  She spoke to her son who is anesthesiologist and he wondered about acid reflux or taking empiric steroids which is also my recommendation.  She is not on fish oil which is at risk factor for this.  Otherwise is doing well.       OV 10/18/2023  Subjective:  Patient ID: KHAMARI YOUSUF, female , DOB: Jun 11, 1945 , age 37 y.o. , MRN: [id number] , ADDRESS: [address] [place] [facility] [address] PCP Clarice Nottingham, MD Patient Care Team: Clarice Nottingham, MD as  PCP - General (Internal Medicine) Burnard Debby LABOR, MD (Inactive) as PCP - Cardiology (Cardiology)  This Provider for this visit: Treatment Team:  Attending Provider: Geronimo Amel, MD    10/18/2023 -   Chief Complaint  Patient presents with   Acute Visit    Increased cough x 1 month- non prod. Feno today = 42.      HPI AMINAH ZABAWA 78 y.o. -returns for acute visit.  She says after the COVID last year she has been dealing with chronic intermittent cough with bad bouts but this is improved overall but still persist.  Then some 3 weeks ago she wrecked some yard leaves in the hot weather and since then she is having increased cough with white sputum.  Exam nitric oxide  test today is elevated at 45.  Cough is every few days.  No wheezing she is continues to be off Dupixent  she is compliant with her Symbicort .  She is willing to take prednisone  but she does want a preemptive nystatin  prescription.      Ref. Range  10/07/2016 12:29 05/10/2017  06/08/2017 Start nucala  08/08/2017  10/16/2017   11/30/2017  03/01/2018 On dupixemen 05/31/2018  01/14/20  Stopped singulair  in July, reduced dupixent  doe july 12/14/2020 Using ACT from no won 11/08/2021 OFF DUPIXENT  since April 2023 01/28/2022  11/04/2022 Covid a fdw week ago Off dupixent  10/18/2023   Nitric Oxide  Unknown 180 153 156 162 237 158 43 22 32 25  31 x 29 45 pgob  ACQ score through march 2020 and then ACTfrom oct 2021   3.6   2.2  1.2 1.8 0 0 24  24 24 18      PFT     Latest Ref Rng & Units 11/29/2016   10:07 AM  PFT Results  FVC-Pre L 2.62   FVC-Predicted Pre % 90   FVC-Post L 2.50   FVC-Predicted Post % 86   Pre FEV1/FVC % % 79   Post FEV1/FCV % % 80   FEV1-Pre L 2.08   FEV1-Predicted Pre % 95   FEV1-Post L 2.00   DLCO uncorrected ml/min/mmHg 20.03   DLCO UNC% % 87   DLCO corrected ml/min/mmHg 19.74   DLCO COR %Predicted % 86   DLVA Predicted % 91   TLC L 5.05   TLC % Predicted % 103   RV % Predicted % 111        LAB RESULTS last 96 hours No results found.       has a past medical history of Anemia, Anginal pain (HCC), Arthritis, Asthma, Complication of anesthesia, Coronary artery disease, Dry eyes, Esophageal spasm, GERD (gastroesophageal reflux disease), and MS (multiple sclerosis) (HCC).   reports that she has never smoked. She has never used smokeless tobacco.  Past Surgical History:  Procedure Laterality Date   ABDOMINAL HYSTERECTOMY     BLEPHAROPLASTY Bilateral    CARDIAC CATHETERIZATION  10/04/06   MINOR CAD,SINGLE VESSEL INVOLVING THE CIRCUMFLEX. 20 TO 30% PROXIMALLY AND 10 TO 20% IN THE MIDDLE SEGMENT.MILD MUSCLE BRIDGING, MID LAD.NORMAL RCA.NORMAL LV FUNCTION.NORMAL MITRAL AND AORTIC VALVE.NORMAL APPEARING AORTA,THORACIC AND ABDOMINAL.NORMAL RENAL ARTERIES.   CARDIOLOGY NUCLEAR MED STUDY  06/22/12   NL LV FUNCTION,EF 68%,NL WALL MOTION.   CAROTID DUPLEX  07/02/11   APO:FPOI SOFT PLAQUE NOTED DISTAL CCA AND ORGIN AND  PROXIMAL ICA,LEFT>RIGHT.NO ICA STENOSIS. VERTEBRAL ARTERY FLOW IS ANTEGRADE.   CESAREAN SECTION     x2    COLONOSCOPY WITH PROPOFOL  N/A 12/11/2014   Procedure: COLONOSCOPY WITH PROPOFOL ;  Surgeon: Lamar Bunk, MD;  Location: [facility];  Service: Endoscopy;  Laterality: N/A;   KNEE ARTHROSCOPY Left    scope   PARATHYROIDECTOMY     partial-many years ago   thumb surgery Bilateral    built up and bone removal   TONSILLECTOMY     TRANSTHORACIC ECHOCARDIOGRAM  07/02/11   LV CAVITY SIZE IS NORMAL. SYSTOLIC FUNCTION WAS NORMAL.EF=55% TO 60%.INCREASED RELATIVE CONTRIBUTION OF ATRIAL CONTRACTION TO VENTRICULAR FILLING;MAYBE DUE TO HYPOVOLEMIA. AV=MILD REGURG.    Allergies  Allergen Reactions   Crestor [Rosuvastatin] Other (See Comments)    Joint pain    Iodinated Contrast Media Other (See Comments)    Sneezing and itchy throat; dye was Isovue  300   Vicodin [Hydrocodone -Acetaminophen ]     Made pass-out one time and is okay taking now   Isovue  [Iopamidol ]     Pt had sneezing and itching of her throat and soft palate.  Dr Wadie checked pt.  She will need premeds in the future.  J Bohm    Immunization History  Administered Date(s) Administered   Fluad Quad(high Dose 65+) 11/27/2018, 12/19/2019, 12/14/2020, 12/14/2021   Influenza, High Dose Seasonal PF 12/22/2013, 12/14/2015, 12/14/2016, 11/30/2017   Influenza,inj,Quad PF,6+ Mos 11/27/2014   PFIZER(Purple Top)SARS-COV-2 Vaccination 04/16/2019, 05/06/2019, 08/06/2020   Pneumococcal Polysaccharide-23 10/26/2013   Pneumococcal-Unspecified 10/26/2013   Tdap 06/14/2011   Zoster Recombinant(Shingrix) 11/13/2017, 02/08/2018    Family History  Problem Relation Age of Onset   Heart disease Mother    Asthma Father    Bladder Cancer Father    Heart failure Father    Hyperlipidemia Brother      Current Outpatient Medications:    albuterol  (VENTOLIN  HFA) 108 (90 Base) MCG/ACT inhaler, Inhale 2 puffs into the lungs every 6 (six) hours as  needed for wheezing or shortness of breath., Disp: 8.5 g, Rfl: 2   budesonide -formoterol  (SYMBICORT ) 80-4.5 MCG/ACT inhaler, INHALE TWO PUFFS INTO THE LUNGS EVERY MORNING AND AT BEDTIME, Disp: 10.2 g, Rfl: 0   Calcium Acetate, Phos Binder, (CALCIUM ACETATE PO), Take by mouth., Disp: , Rfl:    Cholecalciferol (VITAMIN D PO), Take 1 tablet by mouth daily., Disp: , Rfl:    denosumab  (PROLIA ) 60 MG/ML SOSY injection, 60 mg, Disp: , Rfl:    diltiazem  (CARDIZEM  CD) 240 MG 24 hr capsule, Take 1 capsule (240 mg total) by mouth daily., Disp: 90 capsule, Rfl: 3   EPINEPHRINE  0.3 mg/0.3 mL IJ SOAJ injection, USE AS DIRECTED, Disp: 2 each, Rfl: 3   Evolocumab (REPATHA) 140 MG/ML SOSY, Inject into the skin., Disp: , Rfl:    ipratropium-albuterol  (DUONEB) 0.5-2.5 (3) MG/3ML SOLN, USE 1 VIAL ( ) VIA NEBULIZER TWICE DAILY., Disp: 360 mL, Rfl: 0   isosorbide  mononitrate (IMDUR ) 60 MG 24 hr tablet, TAKE 1 & 1/2 TABLETS ONCE DAILY, Disp: 135 tablet, Rfl: 3   lamoTRIgine  (LAMICTAL ) 25 MG tablet, take one tablet by mouth a day for 5 days, then take one tablet twice daily thereafter, Disp: 60 tablet, Rfl: 3   lamoTRIgine  (LAMICTAL ) 25 MG tablet, Take 25 mg by mouth daily., Disp: , Rfl:    levothyroxine (SYNTHROID) 25 MCG tablet, Take 25 mcg by mouth daily before breakfast., Disp: , Rfl:    losartan  (COZAAR ) 50 MG tablet, TAKE 1 & 1/2 TABLETS ONCE DAILY, Disp: 135 tablet, Rfl: 3   Multiple Vitamin (MULTIVITAMIN ADULT PO), Take by mouth., Disp: , Rfl:    nitroGLYCERIN  (NITROSTAT ) 0.4 MG SL tablet, PLACE 1 TABLET UNDER THE TONGUE AS NEEDED  FOR CHEST PAIN, MAY REPEAT EVERY 5 MINUTES., Disp: 25 tablet, Rfl: 3   pantoprazole  (PROTONIX ) 20 MG tablet, TAKE ONE TABLET BY MOUTH TWICE DAILY, Disp: 60 tablet, Rfl: 1   PARoxetine  (PAXIL ) 10 MG tablet, Take 5 mg by mouth daily. , Disp: , Rfl:    predniSONE  (DELTASONE ) 10 MG tablet, Take prednisone  40 mg daily x 2 days, then 20mg  daily x 2 days, then 10mg  daily x 2 days, then 5mg   daily x 2 days and stop, Disp: 15 tablet, Rfl: 0   Respiratory Therapy Supplies (FLUTTER) DEVI, Use as directed, Disp: 1 each, Rfl: 0   baclofen  (LIORESAL ) 10 MG tablet, Take 1/2 to 1 pill every night and 1 pill po qd prn spasticity, Disp: 180 each, Rfl: 4   metoprolol  tartrate (LOPRESSOR ) 25 MG tablet, Take 2 hours before CT scan. (Patient not taking: Reported on 08/10/2023), Disp: 1 tablet, Rfl: 0      Objective:   Vitals:   10/18/23 1302  BP: 126/74  Pulse: 76  SpO2: 96%  Weight: 148 lb (67.1 kg)  Height: 5' 3 (1.6 m)    Estimated body mass index is 26.22 kg/m as calculated from the following:   Height as of this encounter: 5' 3 (1.6 m).   Weight as of this encounter: 148 lb (67.1 kg).  @WEIGHTCHANGE @  [employer]   10/18/23 1302  Weight: 148 lb (67.1 kg)     Physical Exam   General: No distress. Look swell O2 at rest: no Cane present: no Sitting in wheel chair: no Frail: no Obese: no Neuro: Alert and Oriented x 3. GCS 15. Speech normal Psych: Pleasant Resp:  Barrel Chest - no.  Wheeze - no, Crackles - no, No overt respiratory distress CVS: Normal heart sounds. Murmurs - no Ext: Stigmata of Connective Tissue Disease - no HEENT: Normal upper airway. PEERL +. No post nasal drip        Assessment/PLAN     Assessment & Plan Eosinophilic asthma  Moderate persistent asthma with acute exacerbation  History of 2019 novel coronavirus disease (COVID-19)    Patient Instructions     ICD-10-CM   1. 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 History of 2019 novel coronavirus disease (COVID-19)  Z86.16         -Off Dupixent  since April 2023 and since then 1 episode of flareup in August 2023 and then COVID in July 2024.  Currently 10/18/2023 flare up after working in yard few weeks ago Plan -We will try empiric prednisone  for the laryngospasm Take prednisone  40 mg daily x 2 days,  then 20mg  daily x 2 days, then 10mg  daily x 2 days, then 5mg  daily x 2 days and stop Make sure taking your Protonix  as before Chew well and eat slowly and swallow slowly Sleep with head end of the bed elevated or a couple of pillows   - For Oral thrush prevention: Take Suspension (swish and swallow): 500,000 units 4 times/day for 5 days; swish in the mouth and retain for as long as possible (several minutes) before swallowing. IF nystatin  is in back order: alternatives are =    -continue symbicort  as before -Use albuterol  as needed  - ok to monitor without dupixent  at this moment  Follow-up -3 months do spirometry and dlco =-return Inari Shin 15 min in 3 months  - adders post cvodi cough at followup   FOLLOWUP  Return in about 3 months (around 01/18/2024) for 15 min visit, after Spiro and DLCO, with Dr Geronimo.    SIGNATURE    Dr. Dorethia Geronimo, M.D., F.C.C.P,  Pulmonary and Critical Care Medicine Staff Physician, [facility] Director - Interstitial Lung Disease  Program  Pulmonary Fibrosis [facility] at [facility] [place], [place], [address]  Pager: [phone], If no answer or between  15:00h - 7:00h: call [phone] Telephone: [phone]  1:25 PM 10/18/2023   Moderate Complexity MDM OFFICE  2021 E/M guidelines, first released in 2021, with minor revisions added in 2023 and 2024 Must meet the requirements for 2 out of 3 dimensions to qualify.    Number and complexity of problems addressed Amount and/or complexity of data reviewed Risk of complications and/or morbidity  One or more chronic illness with mild exacerbation, OR progression, OR  side effects of treatment  Two or more stable chronic illnesses  One undiagnosed new problem with uncertain prognosis  One acute illness with systemic symptoms   One Acute complicated injury Must meet the requirements for 1 of 3 of the categories)  Category 1: Tests  and documents, historian  Any combination of 3 of the following:  Assessment requiring an independent historian  Review of prior external note(s) from each unique source  Review of results of each unique test  Ordering of each unique test    Category 2: Interpretation of tests   Independent interpretation of a test performed by another physician/other qualified health care professional (not separately reported)  Category 3: Discuss management/tests  Discussion of management or test interpretation with external physician/other qualified health care professional/appropriate source (not separately reported) Moderate risk of morbidity from additional diagnostic testing or treatment Examples only:  Prescription drug management  Decision regarding minor surgery with identfied patient or procedure risk factors  Decision regarding elective major surgery without identified patient or procedure risk factors  Diagnosis or treatment significantly limited by social determinants of health             HIGh Complexity  OFFICE   2021 E/M guidelines, first released in 2021, with minor revisions added in 2023. Must meet the requirements for 2 out of 3 dimensions to qualify.    Number and complexity of problems addressed Amount and/or complexity of data reviewed Risk of complications and/or morbidity  Severe exacerbation of chronic illness  Acute or chronic illnesses that may pose a threat to life or bodily function, e.g., multiple trauma, acute MI, pulmonary embolus, severe respiratory distress, progressive rheumatoid arthritis, psychiatric illness with potential threat to self or others, peritonitis, acute renal failure, abrupt change in neurological status Must meet the requirements for 2 of 3 of the categories)  Category 1: Tests and documents, historian  Any combination of 3 of the following:  Assessment requiring an independent historian  Review of prior external note(s)  from each unique source  Review of results of each unique test  Ordering of each unique test    Category 2: Interpretation of tests    Independent interpretation of a test performed by another physician/other qualified health care professional (not separately reported)  Category 3: Discuss management/tests  Discussion of management or test interpretation with external physician/other qualified health care professional/appropriate source (not separately reported)  HIGH risk of morbidity from additional diagnostic testing or treatment Examples only:  Drug therapy requiring intensive monitoring for toxicity  Decision for elective major surgery with identified pateint  or procedure risk factors  Decision regarding hospitalization or escalation of level of care  Decision for DNR or to de-escalate care   Parenteral controlled  substances            LEGEND - Independent interpretation involves the interpretation of a test for which there is a CPT code, and an interpretation or report is customary. When a review and interpretation of a test is performed and documented by the provider, but not separately reported (billed), then this would represent an independent interpretation. This report does not need to conform to the usual standards of a complete report of the test. This does not include interpretation of tests that do not have formal reports such as a complete blood count with differential and blood cultures. Examples would include reviewing a chest radiograph and documenting in the medical record an interpretation, but not separately reporting (billing) the interpretation of the chest radiograph.   An appropriate source includes professionals who are not health care professionals but may be involved in the management of the patient, such as a Clinical research associate, upper officer, case manager or teacher, and does not include discussion with family or informal caregivers.    - SDOH:  SDOH are the conditions in the environments where people are born, live, learn, work, play, worship, and age that affect a wide range of health, functioning, and quality-of-life outcomes and risks. (e.g., housing, food insecurity, transportation, etc.). SDOH-related Z codes ranging from Z55-Z65 are the ICD-10-CM diagnosis codes used to document SDOH data Z55 - Problems related to education and literacy Z56 - Problems related to employment and unemployment Z57 - Occupational exposure to risk factors Z58 - Problems related to physical environment Z59 - Problems related to housing and economic circumstances [phone] - Problems related to social environment [phone] - Problems related to upbringing [phone] - Other problems related to primary support group, including family circumstances Z70 - Problems related to certain psychosocial circumstances Z65 - Problems related to other psychosocial circumstances

## 2023-10-18 NOTE — Addendum Note (Signed)
 Addended by: Antonius Hartlage M on: 10/18/2023 01:30 PM   Modules accepted: Orders

## 2023-10-18 NOTE — Patient Instructions (Addendum)
 ICD-10-CM   1. 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 History of 2019 novel coronavirus disease (COVID-19)  Z86.16         -Off Dupixent  since April 2023 and since then 1 episode of flareup in August 2023 and then COVID in July 2024.  Currently 10/18/2023 flare up after working in yard few weeks ago Plan -We will try empiric prednisone  for the laryngospasm Take prednisone  40 mg daily x 2 days, then 20mg  daily x 2 days, then 10mg  daily x 2 days, then 5mg  daily x 2 days and stop Make sure taking your Protonix  as before Chew well and eat slowly and swallow slowly Sleep with head end of the bed elevated or a couple of pillows   - For Oral thrush prevention: Take Suspension (swish and swallow): 500,000 units 4 times/day for 5 days; swish in the mouth and retain for as long as possible (several minutes) before swallowing. IF nystatin  is in back order: alternatives are =    -continue symbicort  as before -Use albuterol  as needed  - ok to monitor without dupixent  at this moment  Follow-up -3 months do spirometry and dlco =-return Emileigh Kellett 15 min in 3 months  - adders post cvodi cough at followup

## 2023-11-22 DIAGNOSIS — L820 Inflamed seborrheic keratosis: Secondary | ICD-10-CM | POA: Diagnosis not present

## 2023-11-22 DIAGNOSIS — L308 Other specified dermatitis: Secondary | ICD-10-CM | POA: Diagnosis not present

## 2023-12-13 ENCOUNTER — Emergency Department (HOSPITAL_BASED_OUTPATIENT_CLINIC_OR_DEPARTMENT_OTHER)
Admission: EM | Admit: 2023-12-13 | Discharge: 2023-12-13 | Disposition: A | Discharge status: Home / Self Care | Attending: Emergency Medicine | Admitting: Emergency Medicine

## 2023-12-13 ENCOUNTER — Encounter: Payer: Self-pay | Admitting: Home Modifications

## 2023-12-13 ENCOUNTER — Encounter (HOSPITAL_BASED_OUTPATIENT_CLINIC_OR_DEPARTMENT_OTHER): Payer: Self-pay | Admitting: Emergency Medicine

## 2023-12-13 ENCOUNTER — Other Ambulatory Visit: Payer: Self-pay

## 2023-12-13 ENCOUNTER — Emergency Department (HOSPITAL_BASED_OUTPATIENT_CLINIC_OR_DEPARTMENT_OTHER)

## 2023-12-13 DIAGNOSIS — K760 Fatty (change of) liver, not elsewhere classified: Secondary | ICD-10-CM | POA: Diagnosis not present

## 2023-12-13 DIAGNOSIS — M545 Low back pain, unspecified: Secondary | ICD-10-CM | POA: Diagnosis not present

## 2023-12-13 DIAGNOSIS — I700 Atherosclerosis of aorta: Secondary | ICD-10-CM | POA: Diagnosis not present

## 2023-12-13 DIAGNOSIS — K5730 Diverticulosis of large intestine without perforation or abscess without bleeding: Secondary | ICD-10-CM | POA: Insufficient documentation

## 2023-12-13 DIAGNOSIS — R109 Unspecified abdominal pain: Secondary | ICD-10-CM | POA: Diagnosis not present

## 2023-12-13 DIAGNOSIS — J45909 Unspecified asthma, uncomplicated: Secondary | ICD-10-CM | POA: Diagnosis not present

## 2023-12-13 DIAGNOSIS — M791 Myalgia, unspecified site: Secondary | ICD-10-CM | POA: Diagnosis not present

## 2023-12-13 LAB — URINALYSIS, ROUTINE W REFLEX MICROSCOPIC
Bilirubin Urine: NEGATIVE
Glucose, UA: NEGATIVE mg/dL
Hgb urine dipstick: NEGATIVE
Ketones, ur: NEGATIVE mg/dL
Nitrite: NEGATIVE
Protein, ur: NEGATIVE mg/dL
Specific Gravity, Urine: 1.021 (ref 1.005–1.030)
pH: 5 (ref 5.0–8.0)

## 2023-12-13 LAB — CBC
HCT: 40.9 % (ref 36.0–46.0)
Hemoglobin: 13.9 g/dL (ref 12.0–15.0)
MCH: 30.5 pg (ref 26.0–34.0)
MCHC: 34 g/dL (ref 30.0–36.0)
MCV: 89.9 fL (ref 80.0–100.0)
Platelets: 237 K/uL (ref 150–400)
RBC: 4.55 MIL/uL (ref 3.87–5.11)
RDW: 13.2 % (ref 11.5–15.5)
WBC: 7.6 K/uL (ref 4.0–10.5)
nRBC: 0 % (ref 0.0–0.2)

## 2023-12-13 LAB — COMPREHENSIVE METABOLIC PANEL WITH GFR
ALT: 22 U/L (ref 0–44)
AST: 21 U/L (ref 15–41)
Albumin: 4.6 g/dL (ref 3.5–5.0)
Alkaline Phosphatase: 55 U/L (ref 38–126)
Anion gap: 14 (ref 5–15)
BUN: 17 mg/dL (ref 8–23)
CO2: 21 mmol/L — ABNORMAL LOW (ref 22–32)
Calcium: 10.7 mg/dL — ABNORMAL HIGH (ref 8.9–10.3)
Chloride: 105 mmol/L (ref 98–111)
Creatinine, Ser: 0.73 mg/dL (ref 0.44–1.00)
GFR, Estimated: >60 mL/min (ref >60)
Glucose, Bld: 88 mg/dL (ref 70–99)
Potassium: 4 mmol/L (ref 3.5–5.1)
Sodium: 140 mmol/L (ref 135–145)
Total Bilirubin: 0.4 mg/dL (ref 0.0–1.2)
Total Protein: 7.2 g/dL (ref 6.5–8.1)

## 2023-12-13 LAB — LIPASE, BLOOD: Lipase: 61 U/L — ABNORMAL HIGH (ref 11–51)

## 2023-12-13 MED ORDER — LIDOCAINE 5 % EX PTCH: 1.0000 | MEDICATED_PATCH | CUTANEOUS | 0 refills | Status: AC

## 2023-12-13 MED ORDER — METHOCARBAMOL 500 MG PO TABS: 500.0000 mg | ORAL_TABLET | Freq: Two times a day (BID) | ORAL | 0 refills | Status: AC

## 2023-12-13 MED ORDER — KETOROLAC TROMETHAMINE 30 MG/ML IJ SOLN
15.0000 mg | Freq: Once | INTRAMUSCULAR | Status: AC
Start: 2023-12-13 — End: 2023-12-13
  Administered 2023-12-13: 15 mg via INTRAVENOUS
  Filled 2023-12-13: qty 1

## 2023-12-13 MED ORDER — METHOCARBAMOL 500 MG PO TABS
500.0000 mg | ORAL_TABLET | Freq: Once | ORAL | Status: AC
Start: 2023-12-13 — End: 2023-12-13
  Administered 2023-12-13: 500 mg via ORAL
  Filled 2023-12-13: qty 1

## 2023-12-13 MED ORDER — LIDOCAINE 5 % EX PTCH
2.0000 | MEDICATED_PATCH | CUTANEOUS | Status: DC
  Administered 2023-12-13: 2 via TRANSDERMAL
  Filled 2023-12-13: qty 2

## 2023-12-13 NOTE — ED Provider Notes (Signed)
 [facility] AT [facility] Provider Note   CSN: [id number] Arrival date & time: 12/13/23  1715     Patient presents with: Back Pain and Flank Pain   [name] is a 78 y.o. female.    Back Pain Flank Pain  Patient is a 78 year old female with past medical history significant for MS, asthma, anemia, dry eyes  She presents emergency room today with right sided flank pain ongoing since yesterday denies any urinary frequency urgency dysuria hematuria.  Denies any chest pain or difficulty breathing.     Prior to Admission medications   Medication Sig Start Date End Date Taking? Authorizing Provider  lidocaine  (LIDODERM ) 5 % Place 1 patch onto the skin daily. Remove & Discard patch within 12 hours or as directed by MD 12/13/23  Yes Dominico Rod, Hamp RAMAN, PA  methocarbamol  (ROBAXIN ) 500 MG tablet Take 1 tablet (500 mg total) by mouth 2 (two) times daily. 12/13/23  Yes Asiya Cutbirth S, PA  albuterol  (VENTOLIN  HFA) 108 (90 Base) MCG/ACT inhaler Inhale 2 puffs into the lungs every 6 (six) hours as needed for wheezing or shortness of breath. 08/02/23   Parrett, Madelin RAMAN, NP  baclofen  (LIORESAL ) 10 MG tablet Take 1/2 to 1 pill every night and 1 pill po qd prn spasticity 08/04/22   Sater, Charlie LABOR, MD  budesonide -formoterol  (SYMBICORT ) 80-4.5 MCG/ACT inhaler INHALE TWO PUFFS INTO THE LUNGS EVERY MORNING AND AT BEDTIME 08/01/23   Geronimo Amel, MD  Calcium Acetate, Phos Binder, (CALCIUM ACETATE PO) Take by mouth.    [provider]  Cholecalciferol (VITAMIN D PO) Take 1 tablet by mouth daily.    [provider]  denosumab  (PROLIA ) 60 MG/ML SOSY injection 60 mg 10/31/19   [provider]  diltiazem  (CARDIZEM  CD) 240 MG 24 hr capsule Take 1 capsule (240 mg total) by mouth daily. 06/30/23   Burnard Debby LABOR, MD  EPINEPHRINE  0.3 mg/0.3 mL IJ SOAJ injection USE AS DIRECTED 01/20/20   Geronimo Amel, MD  Evolocumab (REPATHA) 140 MG/ML SOSY Inject into  the skin.    [provider]  ipratropium-albuterol  (DUONEB) 0.5-2.5 (3) MG/3ML SOLN USE 1 VIAL ( ) VIA NEBULIZER TWICE DAILY. 08/01/23   Geronimo Amel, MD  isosorbide  mononitrate (IMDUR ) 60 MG 24 hr tablet TAKE 1 & 1/2 TABLETS ONCE DAILY 07/19/23   Burnard Debby LABOR, MD  lamoTRIgine  (LAMICTAL ) 25 MG tablet take one tablet by mouth a day for 5 days, then take one tablet twice daily thereafter 10/16/23   Sater, Charlie LABOR, MD  lamoTRIgine  (LAMICTAL ) 25 MG tablet Take 25 mg by mouth daily.    [provider]  levothyroxine (SYNTHROID) 25 MCG tablet Take 25 mcg by mouth daily before breakfast.    [provider]  losartan  (COZAAR ) 50 MG tablet TAKE 1 & 1/2 TABLETS ONCE DAILY 07/07/23   Burnard Debby LABOR, MD  metoprolol  tartrate (LOPRESSOR ) 25 MG tablet Take 2 hours before CT scan. Patient not taking: Reported on 08/10/2023 05/25/23   Kate Lonni CROME, MD  Multiple Vitamin (MULTIVITAMIN ADULT PO) Take by mouth.    [provider]  nitroGLYCERIN  (NITROSTAT ) 0.4 MG SL tablet PLACE 1 TABLET UNDER THE TONGUE AS NEEDED FOR CHEST PAIN, MAY REPEAT EVERY 5 MINUTES. 11/26/21   Burnard Debby LABOR, MD  nystatin  (MYCOSTATIN ) 100000 UNIT/ML suspension Swish and swallow 5 ml 4 x daily x 5 days 10/18/23   Geronimo Amel, MD  pantoprazole  (PROTONIX ) 20 MG tablet TAKE ONE TABLET BY MOUTH TWICE  DAILY 10/12/23   Geronimo Amel, MD  PARoxetine  (PAXIL ) 10 MG tablet Take 5 mg by mouth daily.     [provider]  predniSONE  (DELTASONE ) 10 MG tablet Take prednisone  40 mg daily x 2 days, then 20mg  daily x 2 days, then 10mg  daily x 2 days, then 5mg  daily x 2 days and stop 10/18/23   Geronimo Amel, MD  Respiratory Therapy Supplies (FLUTTER) DEVI Use as directed 03/23/17   Neysa Reggy BIRCH, MD    Allergies: Crestor [rosuvastatin], Iodinated contrast media, Vicodin [hydrocodone -acetaminophen ], and Isovue  [iopamidol ]    Review of Systems  Genitourinary:  Positive for flank pain.   Musculoskeletal:  Positive for back pain.    Updated Vital Signs BP 120/61 (BP Location: Left Arm)   Pulse 61   Temp 98 F (36.7 C) (Oral)   Resp 18   SpO2 97%   Physical Exam Vitals and nursing note reviewed.  Constitutional:      General: She is not in acute distress. HENT:     Head: Normocephalic and atraumatic.     Nose: Nose normal.  Eyes:     General: No scleral icterus. Cardiovascular:     Rate and Rhythm: Normal rate and regular rhythm.     Pulses: Normal pulses.     Heart sounds: Normal heart sounds.  Pulmonary:     Effort: Pulmonary effort is normal. No respiratory distress.     Breath sounds: No wheezing.  Abdominal:     Palpations: Abdomen is soft.     Tenderness: There is no abdominal tenderness. There is no guarding or rebound.  Musculoskeletal:     Cervical back: Normal range of motion.     Right lower leg: No edema.     Left lower leg: No edema.     Comments: Palpable muscular tenderness of the right side there is a single trigger point that reproduces the patient's pain.  She flinches when this is pressed on there is no rash or cellulitic changes or zoster rash.  Skin:    General: Skin is warm and dry.     Capillary Refill: Capillary refill takes less than 2 seconds.  Neurological:     Mental Status: She is alert. Mental status is at baseline.  Psychiatric:        Mood and Affect: Mood normal.        Behavior: Behavior normal.     (all labs ordered are listed, but only abnormal results are displayed) Labs Reviewed  URINALYSIS, ROUTINE W REFLEX MICROSCOPIC - Abnormal; Notable for the following components:      Result Value   Leukocytes,Ua SMALL (*)    Bacteria, UA RARE (*)    All other components within normal limits  LIPASE, BLOOD - Abnormal; Notable for the following components:   Lipase 61 (*)    All other components within normal limits  COMPREHENSIVE METABOLIC PANEL WITH GFR - Abnormal; Notable for the following components:   CO2 21 (*)     Calcium 10.7 (*)    All other components within normal limits  CBC    EKG: None  Radiology: CT Renal Stone Study Result Date: 12/13/2023 CLINICAL DATA:  Abdominal/flank pain, stone suspected EXAM: CT ABDOMEN AND PELVIS WITHOUT CONTRAST TECHNIQUE: Multidetector CT imaging of the abdomen and pelvis was performed following the standard protocol without IV contrast. RADIATION DOSE REDUCTION: This exam was performed according to the departmental dose-optimization program which includes automated exposure control, adjustment of the mA and/or kV according to  patient size and/or use of iterative reconstruction technique. COMPARISON:  None Available. FINDINGS: Lower chest: No acute abnormality. Hepatobiliary: Mildly diffusely hypodense hepatic parenchyma compared to the spleen. No focal liver abnormality. No gallstones, gallbladder wall thickening, or pericholecystic fluid. No biliary dilatation. Pancreas: No focal lesion. Normal pancreatic contour. No surrounding inflammatory changes. No main pancreatic ductal dilatation. Spleen: Normal in size without focal abnormality. Adrenals/Urinary Tract: No adrenal nodule bilaterally. Bilateral kidneys enhance symmetrically. No hydronephrosis. No hydroureter. The urinary bladder is unremarkable. Stomach/Bowel: Stomach is within normal limits. No evidence of bowel wall thickening or dilatation. Colonic diverticulosis. Appendix appears normal. Vascular/Lymphatic: No abdominal aorta or iliac aneurysm. Moderate to severe atherosclerotic plaque of the aorta and its branches. No abdominal, pelvic, or inguinal lymphadenopathy. Reproductive: Status post hysterectomy. No adnexal masses. Other: No intraperitoneal free fluid. No intraperitoneal free gas. No organized fluid collection. Musculoskeletal: No abdominal wall hernia or abnormality. No suspicious lytic or blastic osseous lesions. No acute displaced fracture. Dextroscoliosis of the mid lumbar spine. Multilevel degenerative  changes of the spine. IMPRESSION: 1. No acute intra-abdominal or intrapelvic abnormality with limited evaluation on this noncontrast study. 2. Hepatic steatosis. 3. Colonic diverticulosis with no acute diverticulitis. 4.  Aortic Atherosclerosis (ICD10-I70.0). Electronically Signed   By: Morgane  Naveau M.D.   On: 12/13/2023 20:58     Procedures   Medications Ordered in the ED  lidocaine  (LIDODERM ) 5 % 2 patch (2 patches Transdermal Patch Applied 12/13/23 1937)  methocarbamol  (ROBAXIN ) tablet 500 mg (500 mg Oral Given 12/13/23 1937)  ketorolac  (TORADOL ) 30 MG/ML injection 15 mg (15 mg Intravenous Given 12/13/23 2318)    Clinical Course as of 12/14/23 0117  Wed Dec 13, 2023  1900 R side pain yesterday.   Hurts with certain movements.  [WF]    Clinical Course User Index [WF] Neldon Hamp RAMAN, PA                                 Medical Decision Making Amount and/or Complexity of Data Reviewed Labs: ordered. Radiology: ordered.  Risk Prescription drug management.   This patient presents to the ED for concern of flank pain, this involves a number of treatment options, and is a complaint that carries with it a moderate risk of complications and morbidity. A differential diagnosis was considered for the patient's symptoms which is discussed below:   The differential diagnosis of emergent flank pain includes, but is not limited to :Abdominal aortic aneurysm,, Renal artery embolism,Renal vein thrombosis, Aortic dissection, Mesenteric ischemia, Pyelonephritis, Renal infarction, Renal hemorrhage, Nephrolithiasis/ Renal Colic, Bladder tumor,Cystitis, Biliary colic, Pancreatitis Perforated peptic ulcer Appendicitis ,Inguinal Hernia, Diverticulitis, Bowel obstruction Ectopic Pregnancy,PID/TOA,Ovarian cyst, Ovarian torsion Testicular torsion,Epididymitis Shingles Lower lobe pneumonia, Retroperitoneal hematoma/abscess/tumor, Epidural abscess, Epidural hematoma    Co morbidities: Discussed in  HPI   Brief History:  Patient is a 78 year old female with past medical history significant for MS, asthma, anemia, dry eyes  She presents emergency room today with right sided flank pain ongoing since yesterday denies any urinary frequency urgency dysuria hematuria.  Denies any chest pain or difficulty breathing.    EMR reviewed including pt PMHx, past surgical history and past visits to ER.   See HPI for more details   Lab Tests:   I personally reviewed all laboratory work and imaging. Metabolic panel without any acute abnormality specifically kidney function within normal limits and no significant electrolyte abnormalities. CBC without leukocytosis or significant anemia.  Imaging Studies:  NAD. I personally reviewed all imaging studies and no acute abnormality found. I agree with radiology interpretation.    Cardiac Monitoring:  The patient was maintained on a cardiac monitor.  I personally viewed and interpreted the cardiac monitored which showed an underlying rhythm of: NSR NA   Medicines ordered:  I ordered medication including Toradol , Robaxin , Lidoderm  for pain Reevaluation of the patient after these medicines showed that the patient improved I have reviewed the patients home medicines and have made adjustments as needed   Critical Interventions:     Consults/Attending Physician      Reevaluation:  After the interventions noted above I re-evaluated patient and found that they have :improved   Social Determinants of Health:      Problem List / ED Course:  Patient with trigger point area of muscular tenderness in right flank on further questioning she indicates that she walks her dog and often has to strain against the leash.  She is reassuring workup CT renal stone study without stone or abnormal finding.  Lab work reassuring.  She feels much improved after Lidoderm  patch and Robaxin .  Will give a dose of Toradol  and discharged  home.   Dispostion:  After consideration of the diagnostic results and the patients response to treatment, I feel that the patent would benefit from outpatient follow-up.  Final diagnoses:  Muscle pain    ED Discharge Orders          Ordered    methocarbamol  (ROBAXIN ) 500 MG tablet  2 times daily        12/13/23 2309    lidocaine  (LIDODERM ) 5 %  Every 24 hours        12/13/23 2309               Neldon Hamp RAMAN, [place] 12/14/23 9880    Tegeler, Lonni PARAS, MD 12/16/23 1459

## 2023-12-13 NOTE — Discharge Instructions (Addendum)
 Please use Tylenol  or ibuprofen  for pain.  You may use 600 mg ibuprofen  every 6 hours or 1000 mg of Tylenol  every 6 hours.  You may choose to alternate between the 2.  This would be most effective.  Not to exceed 4 g of Tylenol  within 24 hours.  Not to exceed 3200 mg ibuprofen  24 hours.  Robaxin  for pain only if needed, keep in mind that this can cause fatigue and increased risk of fall.  Do not take this medication with any other muscle relaxers such as baclofen , tizanidine, Flexeril  Use lidocaine  patches as prescribed as needed.

## 2023-12-13 NOTE — ED Triage Notes (Signed)
 Right side flank pain grabbing pain Started yesterday Denies urinary symptom

## 2023-12-14 ENCOUNTER — Ambulatory Visit

## 2023-12-14 VITALS — BP 146/65 | HR 63 | Temp 97.4°F | Resp 20 | Ht 63.0 in | Wt 147.6 lb

## 2023-12-14 DIAGNOSIS — M810 Age-related osteoporosis without current pathological fracture: Secondary | ICD-10-CM

## 2023-12-14 MED ORDER — DENOSUMAB 60 MG/ML ~~LOC~~ SOSY
60.0000 mg | PREFILLED_SYRINGE | Freq: Once | SUBCUTANEOUS | Status: AC
  Administered 2023-12-14: 60 mg via SUBCUTANEOUS
  Filled 2023-12-14: qty 1

## 2023-12-14 NOTE — Progress Notes (Signed)
 Diagnosis: Osteoporosis  Provider:  Chilton Greathouse MD  Procedure: Injection  Prolia (Denosumab), Dose: 60 mg, Site: subcutaneous, Number of injections: 1  Injection Site(s): Right arm  Post Care: Patient declined observation  Discharge: Condition: Good, Destination: Home . AVS Declined  Performed by:  Loney Hering, LPN

## 2023-12-19 ENCOUNTER — Other Ambulatory Visit: Payer: Self-pay | Admitting: Internal Medicine

## 2023-12-21 DIAGNOSIS — M810 Age-related osteoporosis without current pathological fracture: Secondary | ICD-10-CM | POA: Diagnosis not present

## 2023-12-21 DIAGNOSIS — E039 Hypothyroidism, unspecified: Secondary | ICD-10-CM | POA: Diagnosis not present

## 2023-12-21 DIAGNOSIS — Z23 Encounter for immunization: Secondary | ICD-10-CM | POA: Diagnosis not present

## 2023-12-25 DIAGNOSIS — Z1231 Encounter for screening mammogram for malignant neoplasm of breast: Secondary | ICD-10-CM | POA: Diagnosis not present

## 2023-12-25 DIAGNOSIS — Z6826 Body mass index (BMI) 26.0-26.9, adult: Secondary | ICD-10-CM | POA: Diagnosis not present

## 2023-12-25 DIAGNOSIS — Z01419 Encounter for gynecological examination (general) (routine) without abnormal findings: Secondary | ICD-10-CM | POA: Diagnosis not present

## 2024-01-16 ENCOUNTER — Encounter: Payer: Self-pay | Admitting: Home Modifications

## 2024-01-18 ENCOUNTER — Other Ambulatory Visit: Payer: Self-pay | Admitting: Home Modifications

## 2024-01-22 ENCOUNTER — Encounter

## 2024-01-23 ENCOUNTER — Ambulatory Visit: Admitting: Internal Medicine

## 2024-01-31 ENCOUNTER — Ambulatory Visit (HOSPITAL_BASED_OUTPATIENT_CLINIC_OR_DEPARTMENT_OTHER)

## 2024-01-31 DIAGNOSIS — J8283 Eosinophilic asthma: Secondary | ICD-10-CM | POA: Diagnosis not present

## 2024-01-31 DIAGNOSIS — J4541 Moderate persistent asthma with (acute) exacerbation: Secondary | ICD-10-CM

## 2024-01-31 DIAGNOSIS — J45909 Unspecified asthma, uncomplicated: Secondary | ICD-10-CM | POA: Diagnosis not present

## 2024-01-31 DIAGNOSIS — Z8616 Personal history of COVID-19: Secondary | ICD-10-CM

## 2024-01-31 LAB — PULMONARY FUNCTION TEST
FEF 25-75 Post: 1.45 L/s
FEF 25-75 Pre: 1.56 L/s
FEF2575-%Change-Post: -7 %
FEF2575-%Pred-Post: 96 %
FEF2575-%Pred-Pre: 103 %
FEV1-%Change-Post: -1 %
FEV1-%Pred-Post: 94 %
FEV1-%Pred-Pre: 95 %
FEV1-Post: 1.84 L
FEV1-Pre: 1.87 L
FEV1FVC-%Change-Post: 2 %
FEV1FVC-%Pred-Pre: 102 %
FEV6-%Change-Post: -4 %
FEV6-%Pred-Post: 94 %
FEV6-%Pred-Pre: 98 %
FEV6-Post: 2.34 L
FEV6-Pre: 2.45 L
FEV6FVC-%Pred-Post: 105 %
FEV6FVC-%Pred-Pre: 105 %
FVC-%Change-Post: -4 %
FVC-%Pred-Post: 89 %
FVC-%Pred-Pre: 93 %
FVC-Post: 2.34 L
FVC-Pre: 2.45 L
Post FEV1/FVC ratio: 78 %
Post FEV6/FVC ratio: 100 %
Pre FEV1/FVC ratio: 76 %
Pre FEV6/FVC Ratio: 100 %

## 2024-01-31 NOTE — Patient Instructions (Signed)
Spirometry pre/post performed today. 

## 2024-01-31 NOTE — Progress Notes (Signed)
Spirometry pre/post performed today. 

## 2024-02-02 ENCOUNTER — Encounter: Payer: Self-pay | Admitting: Internal Medicine

## 2024-02-02 ENCOUNTER — Ambulatory Visit: Admitting: Internal Medicine

## 2024-02-02 VITALS — BP 116/72 | HR 65 | Temp 98.3°F | Ht 62.5 in | Wt 148.8 lb

## 2024-02-02 DIAGNOSIS — Z7185 Encounter for immunization safety counseling: Secondary | ICD-10-CM

## 2024-02-02 DIAGNOSIS — J4540 Moderate persistent asthma, uncomplicated: Secondary | ICD-10-CM | POA: Diagnosis not present

## 2024-02-02 DIAGNOSIS — J8283 Eosinophilic asthma: Secondary | ICD-10-CM

## 2024-02-02 DIAGNOSIS — R059 Cough, unspecified: Secondary | ICD-10-CM

## 2024-02-02 DIAGNOSIS — J4541 Moderate persistent asthma with (acute) exacerbation: Secondary | ICD-10-CM

## 2024-02-02 NOTE — Progress Notes (Signed)
 OV 11/30/2017  Subjective:  Patient ID: [name], female , DOB: 03/28/1946 , age 78 y.o. , MRN: [id number] , ADDRESS: [address] [place] [facility] [address]   11/30/2017 -   Chief Complaint  Patient presents with   Follow-up    Pt states she has been doing good since last visit except states she has had a headache x2 days and has had a cough. Pt also has c/o chest tightness.     HPI [name] 78 y.o. -follow-up severe persistent asthma with poorly controlled symptoms. She is on Dulera, Singulair  and biologic Nucala . She reports that currently it is one of her better days and weeks. Despite that asthma control questionnaire shows significant symptom with the 5 point score of 1.8. She says she is not waking up in the middle of the night because of asthma when she wakes up she is very mild symptoms when she is very slightly limited in her activities and she is moderately short of breath and wheezing a lot of the time but not using much albuterol  for rescue. This is despite compliance. Last visit we switched her to Dupixent  but this is stuck with insurance pre-authorization required and that is some delays with this. In addition she also tells me that at every visit she's been giving sputum samples at our lab basementand she is unclear why no one called her with results. I have told her that I was unaware that this was going on. Review of the charts indicate sputum samples given October 2018 in April 2019. Along and consistent with a respiratory in asthma symptoms at that early morning she has cough with yellow sputum which is consistent with high airway eosinophil load as evidenced by significantly high exhaled nitric oxide  is despite biologic therapy.  [name]  ROS - per HPI       OV 03/01/2018  Subjective:  Patient ID: [name], female , DOB: [birth year] , age 78 y.o. , MRN: [id number] , ADDRESS: [address] [place] [facility] [address]   03/01/2018 -   Chief Complaint   Patient presents with   Follow-up    f/u asthma, no wheezing since dupixent      HPI [name] 78 y.o. -  Follow-up severe eosinophilic asthma on Dupixent .  She is taking the shots in her arm every 2 weeks.  This is been going on for 8 weeks or so.  She reports excellent improvement in her asthma.  Asthma control questionnaire 0 out of 5.  Significant improvement in her exam nitric oxide  today going from the mid 100s to 43.  She continues to be on Dulera and Singulair .  She is asking if she can de-escalate any of this therapy.  She is not waking up in the middle of the night with asthma.  She not having any symptoms when she wakes up.  Not limited in her activities.  When she wakes up she not short of breath.  No wheezing.  She not using albuterol  for rescue.  She thinks she might be getting a cold but she is not sure.  Nitric oxide  and symptom profile shows huge improvements.  However she is wondering if she is having side effects from the dupilumab .  She is reporting some blurred vision and needing to use reading glasses more so after starting the dupilumab .  This is temporally correlated.  Review of the literature shows conjunctivitis and pruritus reported at low percentages but not blurred vision.  OV 05/31/2018  Subjective:  Patient ID: [name], female , DOB: 11/28/1945 , age 78 y.o. , MRN: [id number] , ADDRESS: [address] [place] [facility] [address]   05/31/2018 -   Chief Complaint  Patient presents with   Follow-up    Pt states she has been doing okay since last visit and denies any current complaints of cough, SOB, or CP     HPI [name] 78 y.o. -presents for follow-up.  She has severe asthma with eosinophilia.  Finally she is on Dupixent /in December 2019 she had asthma exacerbation and ended up in the ER and needed prednisone  but after that she is been doing well.  Asthma control question is 0.  She not waking up in the middle of the night with symptoms when she wakes  up she is asymptomatic.  No limitations in activities no albuterol  rescue use.  No wheezing no shortness of breath no cough.  Asthma control question is 0.  Exam nitric oxide  is further improved to 22.  However she wants to stop the Dupixent  because she feels she is getting a brain fog although she states that it could be related to MS.  She also wants to start her MS medications and is worried about the effects of polypharmacy.  She also discussed because of the upcoming coronavirus 19 pandemic.           OV 10/14/2019  Subjective:  Patient ID: [name], female , DOB: 01/12/1946 , age 78 y.o. , MRN: [id number] , ADDRESS: [address] [place] [facility] [address]   10/14/2019 -   Chief Complaint  Patient presents with   Follow-up    Eosinophilic moderate to severe persistent asthma HPI [name] 78 y.o. -presents for follow-up.  I last saw her March 2020.  Then with the pandemic I did not see her.  She has been coming to the office and getting her Dupixent  twice weekly.  She is on the higher dose.  She is not using any inhaled steroid.  She wants to know if she can reduce the dose of the Dupixent .  She is also on Zyrtec  and Singulair .  She wants to know if she can stop this.  Asthma is completely under control and she is asymptomatic.        ROS  OV 12/14/2020  Subjective:  Patient ID: [name], female , DOB: 1945/11/16 , age 78 y.o. , MRN: [id number] , ADDRESS: [address] [place] [address] PCP Robyn Nottingham, MD Patient Care Team: Robyn Nottingham, MD as PCP - General (Internal Medicine) [name] LABOR, MD as PCP - Cardiology (Cardiology)  This Provider for this visit: Treatment Team:  Attending Provider: Geronimo Amel, MD    12/14/2020 -   Chief Complaint  Patient presents with   Follow-up    Pt states she has been doing okay since last visit. States she has had an occ dry cough which lasted about 10 days but then went away.    Eosinophilic  moderate to severe persistent asthma  HPI [name] 78 y.o. -last sseen by me July 2021 and then NP in OCt 2021. Asthma under excellent control based on ACT and feno. If she cuts grass or trimes bushes she gets cough. Has randon cough episodes occasionally. Otherwise well . Currentlyt on dupixent  alone as single drug for asthma.  Discussed high dose flu shot and she will have it. Discussed covid mRNA bivalent booster -r ecommended she have it.  Never had covid before     Asthma Control Test ACT Total Score  12/14/2020 25  01/14/2020 24     Lab Results  Component Value Date   NITRICOXIDE 25 12/14/2020     OV 07/21/2021  Subjective:  Patient ID: [name], female , DOB: [birth year] , age 89 y.o. , MRN: [id number] , ADDRESS: [address] [place] [facility] [address] PCP Robyn Nottingham, MD Patient Care Team: Robyn Nottingham, MD as PCP - General (Internal Medicine) [name] LABOR, MD as PCP - Cardiology (Cardiology)  This Provider for this visit: Treatment Team:  Attending Provider: Geronimo Amel, MD    07/21/2021 -   Chief Complaint  Patient presents with   Follow-up    Pt states she has been doing good since last visit and denies any complaints. States that she has not had any recent flare ups.     HPI [name] 78 y.o. -follows up eosinophilic asthma.  She is on Dupixent  low-dose.  She is also now on Symbicort .  She says she is doing really well.  Asthma control test questionnaire is normal.  Exam nitric oxide  test is normal.  She wants to stop taking Dupixent .  This worked really well for her but she feels that she is under remission.  She feels her daughter Lauraine might be upset if she stopped the Dupixent .  However we discussed this.  She has been on low-dose for a year.  We think we can take an expectant approach of stopping Dupixent  and see how she does.  She is open to this.  And therefore we took decision to stop Dupixent  but continue  Symbicort .   ACT  Asthma Control Test ACT Total Score  07/21/2021 11:34 AM 25  12/14/2020  1:50 PM 25  01/14/2020 10:02 AM 24    Lab Results  Component Value Date   NITRICOXIDE 18 07/21/2021       OV 11/08/2021  Subjective:  Patient ID: [name], female , DOB: 1946-03-04 , age 74 y.o. , MRN: [id number] , ADDRESS: [address] [place] [facility] [address] PCP Robyn Nottingham, MD Patient Care Team: Robyn Nottingham, MD as PCP - General (Internal Medicine) [name] LABOR, MD as PCP - Cardiology (Cardiology)  This Provider for this visit: Treatment Team:  Attending Provider: Geronimo Amel, MD    11/08/2021 -   Chief Complaint  Patient presents with   Follow-up    Pt states she has been doing okay since last visit and denies any complaints.     HPI [name] 78 y.o. -returns for follow-up.  She has eosinophilic asthma.  In April 2023 after years of being on Dupixent  we stopped it.  She continues to do well.  Her exhaled nitric oxide  is within normal range/slightly gray zone 31.  Otherwise feeling good.  She is up-to-date with her vaccines.  She wanted to know about RSV vaccine.  She is going to think about this.  She wanted know about COVID mRNA vaccine booster.  We discussed about the current strains in the community.  We took a shared decision making to evaluate the strains in the community for COVID and then decide on the COVID mRNA booster.  She is going to wait on the literature on RSV vaccine.  She will have the high-dose flu shot.  At this point in time we took a shared decision making to monitor her off Dupixent  because asthma control is very good.     OV 01/28/2022  Subjective:  Patient ID: [name], female , DOB: [birth year] , age 33 y.o. , MRN: [id number] , ADDRESS: [address] [place] [facility] [address] PCP Robyn Nottingham, MD Patient Care Team: Robyn Nottingham, MD as PCP - General (Internal Medicine) [name] LABOR, MD as PCP - Cardiology  (Cardiology)  This Provider for this visit: Treatment Team:  Attending Provider: Geronimo Amel, MD    01/28/2022 -   Chief Complaint  Patient presents with   Follow-up    Pt states she has been doing okay since last visit and denies any complaints.     HPI [name] 78 y.o. -returns for follow-up.  Since I last saw her she had 1 episode of pneumonia.  Apparently x-ray was done by primary care physician.  She did see Dr. Slater Staff acutely in our office in August 2023.  And was given 7-day Levaquin  and prednisone .  She followed up with nurse practitioner 12/14/2021 and the chest x-ray did not show pneumonia.  Currently doing well.  Asthma control test questionnaire shows excellent asthma control.  She is here with her daughter Lauraine.  They are wondering if she should restart her Dupixent .  We discussed this in detail.  I certainly said that we could restart it now.  Alternatively we could follow closely with good close follow-up and if there is another respiratory exacerbation definitely before spring or summer 2024 then that should be low threshold definitely start Dupixent  again.  We can even do low-dose Dupixent .  They are okay with this plan and will stay in touch.  We also discussed respiratory vaccines.  I recommended RSV and the COVID mRNA booster.     Asthma Control Test ACT Total Score  01/28/2022  3:23 PM 24  11/08/2021  1:53 PM 24  07/21/2021 11:34 AM 25    OV 11/04/2022  Subjective:  Patient ID: [name], female , DOB: 21-Feb-1946 , age 69 y.o. , MRN: [id number] , ADDRESS: [address] [place] [facility] [address] PCP Robyn Nottingham, MD Patient Care Team: Robyn Nottingham, MD as PCP - General (Internal Medicine) [name] LABOR, MD as PCP - Cardiology (Cardiology)  This Provider for this visit: Treatment Team:  Attending Provider: Geronimo Amel, MD    11/04/2022 -   Chief Complaint  Patient presents with   Follow-up    F/up on asthma      HPI [name] 78 y.o. -presents for her asthma follow-up.  Her Joycie continues to be range bound.  However 10/12/2022 she suffered from COVID-19 with sore throat.  And then she took Paxlovid  and started getting better but by October 21, 2022 October 22, 2022 she had rebound and finally resolved all her symptoms by October 26, 2022 although she is left with a new onset laryngospasm.  She has happened 5 times.  It happened in restaurants and 1 time in the art studio when she swallows saliva.  She said she nearly called EMS.  She spoke to her son who is anesthesiologist and he wondered about acid reflux or taking empiric steroids which is also my recommendation.  She is not on fish oil which is at risk factor for this.  Otherwise is doing well.       OV 10/18/2023  Subjective:  Patient ID: [name], female , DOB: [birth year] , age 12 y.o. , MRN: [id number] , ADDRESS: [address] [place] [facility] [address] PCP Robyn Nottingham, MD Patient Care Team: Robyn Nottingham, MD as PCP - General (  Internal Medicine) [name] LABOR, MD (Inactive) as PCP - Cardiology (Cardiology)  This Provider for this visit: Treatment Team:  Attending Provider: Geronimo Amel, MD    10/18/2023 -   Chief Complaint  Patient presents with   Acute Visit    Increased cough x 1 month- non prod. Feno today = 42.      HPI [name] 78 y.o. -returns for acute visit.  She says after the COVID last year she has been dealing with chronic intermittent cough with bad bouts but this is improved overall but still persist.  Then some 3 weeks ago she wrecked some yard leaves in the hot weather and since then she is having increased cough with white sputum.  Exam nitric oxide  test today is elevated at 45.  Cough is every few days.  No wheezing she is continues to be off Dupixent  she is compliant with her Symbicort .  She is willing to take prednisone  but she does want a preemptive nystatin  prescription.    OV  02/02/2024  Subjective:  Patient ID: [name], female , DOB: 07-31-1945 , age 34 y.o. , MRN: [id number] , ADDRESS: [address] [place] [facility] [id number] PCP Robyn Nottingham, MD Patient Care Team: Robyn Nottingham, MD as PCP - General (Internal Medicine) [name] LABOR, MD (Inactive) as PCP - Cardiology (Cardiology)  This Provider for this visit: Treatment Team:  Attending Provider: Geronimo Amel, MD    02/02/2024 -   Chief Complaint  Patient presents with   Asthma    PFT F/U  Pt states since LOV breathing has been good Dry cough occurs off and on     HPI KARTER HAIRE 78 y.o. -  Jayelle Page Trevizo is a 78 year old female with asthma who presents for follow-up of her respiratory symptoms.  She experienced a persistent cough during the summer following a COVID-19 infection a year ago. Currently, she has an intermittent cough that occurs sporadically, lasting about three days before resolving spontaneously. The cause of this cough is unclear to her.  She has not had any emergency room visits related to her respiratory condition since July, although she did have one visit for a hip-related issue. There have been no recent hospitalizations or surgeries.  Her current medication regimen includes Symbicort , which she takes as two puffs twice daily. No nasal symptoms are present, and she is not taking any medication for nasal symptoms.  In terms of vaccination history, she has received her flu shot and RSV shot. She had COVID-19 a year ago and has not received the new COVID vaccine.  She previously used an EpiPen  when she was on Nucala  but no longer requires it as she does not have a history of anaphylactic reactions. She had a severe pneumonia two years ago but does not have any known allergies that would necessitate an EpiPen .  DIAGNOSTIC Pulmonary function test: Slight age-related decline, loss of 6 mL of lung function per year since 2018, consistent with normal aging  process.   Ref. Range 10/07/2016 12:29 05/10/2017  06/08/2017 Start nucala  08/08/2017  10/16/2017   11/30/2017  03/01/2018 On dupixemen 05/31/2018  01/14/20  Stopped singulair  in July, reduced dupixent  doe july 12/14/2020 Using ACT from no won 11/08/2021 OFF DUPIXENT  since April 2023 01/28/2022  11/04/2022 Covid a fdw week ago Off dupixent  10/18/2023   Nitric Oxide  Unknown 180 153 156 162 237 158 43 22 32 25  31 x 29 45 pgob  ACQ score through march 2020 and  then ACTfrom oct 2021   3.6   2.2  1.2 1.8 0 0 24  24 24 18      PFT     Latest Ref Rng & Units 01/31/2024   12:43 PM 11/29/2016   10:07 AM  PFT Results  FVC-Pre L 2.45  P 2.62   FVC-Predicted Pre % 93  P 90   FVC-Post L 2.34  P 2.50   FVC-Predicted Post % 89  P 86   Pre FEV1/FVC % % 76  P 79   Post FEV1/FCV % % 78  P 80   FEV1-Pre L 1.87  P 2.08   FEV1-Predicted Pre % 95  P 95   FEV1-Post L 1.84  P 2.00   DLCO uncorrected ml/min/mmHg  20.03   DLCO UNC% %  87   DLCO corrected ml/min/mmHg  19.74   DLCO COR %Predicted %  86   DLVA Predicted %  91   TLC L  5.05   TLC % Predicted %  103   RV % Predicted %  111     P Preliminary result       LAB RESULTS last 96 hours No results found.       has a past medical history of Anemia, Anginal pain, Arthritis, Asthma, Complication of anesthesia, Coronary artery disease, Dry eyes, Esophageal spasm, GERD (gastroesophageal reflux disease), and MS (multiple sclerosis).   reports that she has never smoked. She has never used smokeless tobacco.  Past Surgical History:  Procedure Laterality Date   ABDOMINAL HYSTERECTOMY     BLEPHAROPLASTY Bilateral    CARDIAC CATHETERIZATION  10/04/06   MINOR CAD,SINGLE VESSEL INVOLVING THE CIRCUMFLEX. 20 TO 30% PROXIMALLY AND 10 TO 20% IN THE MIDDLE SEGMENT.MILD MUSCLE BRIDGING, MID LAD.NORMAL RCA.NORMAL LV FUNCTION.NORMAL MITRAL AND AORTIC VALVE.NORMAL APPEARING AORTA,THORACIC AND ABDOMINAL.NORMAL RENAL ARTERIES.   CARDIOLOGY NUCLEAR MED STUDY   06/22/12   NL LV FUNCTION,EF 68%,NL WALL MOTION.   CAROTID DUPLEX  07/02/11   APO:FPOI SOFT PLAQUE NOTED DISTAL CCA AND ORGIN AND PROXIMAL ICA,LEFT>RIGHT.NO ICA STENOSIS. VERTEBRAL ARTERY FLOW IS ANTEGRADE.   CESAREAN SECTION     x2    COLONOSCOPY WITH PROPOFOL  N/A 12/11/2014   Procedure: COLONOSCOPY WITH PROPOFOL ;  Surgeon: Lamar Bunk, MD;  Location: [facility];  Service: Endoscopy;  Laterality: N/A;   KNEE ARTHROSCOPY Left    scope   PARATHYROIDECTOMY     partial-many years ago   thumb surgery Bilateral    built up and bone removal   TONSILLECTOMY     TRANSTHORACIC ECHOCARDIOGRAM  07/02/11   LV CAVITY SIZE IS NORMAL. SYSTOLIC FUNCTION WAS NORMAL.EF=55% TO 60%.INCREASED RELATIVE CONTRIBUTION OF ATRIAL CONTRACTION TO VENTRICULAR FILLING;MAYBE DUE TO HYPOVOLEMIA. AV=MILD REGURG.    Allergies  Allergen Reactions   Crestor [Rosuvastatin] Other (See Comments)    Joint pain    Iodinated Contrast Media Other (See Comments)    Sneezing and itchy throat; dye was Isovue  300   Vicodin [Hydrocodone -Acetaminophen ]     Made pass-out one time and is okay taking now   Isovue  [Iopamidol ]     Pt had sneezing and itching of her throat and soft palate.  Dr Wadie checked pt.  She will need premeds in the future.  J Bohm    Immunization History  Administered Date(s) Administered   Fluad Quad(high Dose 65+) 11/27/2018, 12/19/2019, 12/14/2020, 12/14/2021, 12/29/2023   INFLUENZA, HIGH DOSE SEASONAL PF 12/22/2013, 12/14/2015, 12/14/2016, 11/30/2017   Influenza,inj,Quad PF,6+ Mos 11/27/2014   PFIZER(Purple Top)SARS-COV-2 Vaccination 04/16/2019, 05/06/2019,  08/06/2020   Pneumococcal Polysaccharide-23 10/26/2013   Pneumococcal-Unspecified 10/26/2013   Tdap 06/14/2011   Zoster Recombinant(Shingrix) 11/13/2017, 02/08/2018    Family History  Problem Relation Age of Onset   Heart disease Mother    Asthma Father    Bladder Cancer Father    Heart failure Father    Hyperlipidemia Brother       Current Outpatient Medications:    albuterol  (VENTOLIN  HFA) 108 (90 Base) MCG/ACT inhaler, Inhale 2 puffs into the lungs every 6 (six) hours as needed for wheezing or shortness of breath., Disp: 8.5 g, Rfl: 2   budesonide -formoterol  (SYMBICORT ) 80-4.5 MCG/ACT inhaler, INHALE TWO PUFFS INTO THE LUNGS EVERY MORNING AND AT BEDTIME, Disp: 10.2 g, Rfl: 7   Calcium Acetate, Phos Binder, (CALCIUM ACETATE PO), Take by mouth., Disp: , Rfl:    Cholecalciferol (VITAMIN D PO), Take 1 tablet by mouth daily., Disp: , Rfl:    denosumab  (PROLIA ) 60 MG/ML SOSY injection, 60 mg, Disp: , Rfl:    diltiazem  (CARDIZEM  CD) 240 MG 24 hr capsule, Take 1 capsule (240 mg total) by mouth daily., Disp: 90 capsule, Rfl: 3   EPINEPHRINE  0.3 mg/0.3 mL IJ SOAJ injection, USE AS DIRECTED, Disp: 2 each, Rfl: 3   Evolocumab (REPATHA) 140 MG/ML SOSY, Inject into the skin., Disp: , Rfl:    ipratropium-albuterol  (DUONEB) 0.5-2.5 (3) MG/3ML SOLN, USE 1 VIAL ( ) VIA NEBULIZER TWICE DAILY., Disp: 360 mL, Rfl: 0   isosorbide  mononitrate (IMDUR ) 60 MG 24 hr tablet, TAKE 1 & 1/2 TABLETS ONCE DAILY, Disp: 135 tablet, Rfl: 3   lamoTRIgine  (LAMICTAL ) 25 MG tablet, Take 25 mg by mouth daily., Disp: , Rfl:    levothyroxine (SYNTHROID) 25 MCG tablet, Take 25 mcg by mouth daily before breakfast., Disp: , Rfl:    losartan  (COZAAR ) 50 MG tablet, TAKE 1 & 1/2 TABLETS ONCE DAILY, Disp: 135 tablet, Rfl: 3   Multiple Vitamin (MULTIVITAMIN ADULT PO), Take by mouth., Disp: , Rfl:    nitroGLYCERIN  (NITROSTAT ) 0.4 MG SL tablet, PLACE 1 TABLET UNDER THE TONGUE AS NEEDED FOR CHEST PAIN, MAY REPEAT EVERY 5 MINUTES., Disp: 25 tablet, Rfl: 3   pantoprazole  (PROTONIX ) 20 MG tablet, TAKE ONE TABLET BY MOUTH TWICE DAILY, Disp: 60 tablet, Rfl: 1   PARoxetine  (PAXIL ) 10 MG tablet, Take 5 mg by mouth daily. , Disp: , Rfl:    Respiratory Therapy Supplies (FLUTTER) DEVI, Use as directed, Disp: 1 each, Rfl: 0   baclofen  (LIORESAL ) 10 MG tablet, Take 1/2 to 1  pill every night and 1 pill po qd prn spasticity (Patient not taking: Reported on 02/02/2024), Disp: 180 each, Rfl: 4   lamoTRIgine  (LAMICTAL ) 25 MG tablet, take one tablet by mouth a day for 5 days, then take one tablet twice daily thereafter (Patient not taking: Reported on 02/02/2024), Disp: 60 tablet, Rfl: 3   lidocaine  (LIDODERM ) 5 %, Place 1 patch onto the skin daily. Remove & Discard patch within 12 hours or as directed by MD (Patient not taking: Reported on 02/02/2024), Disp: 30 patch, Rfl: 0   methocarbamol  (ROBAXIN ) 500 MG tablet, Take 1 tablet (500 mg total) by mouth 2 (two) times daily. (Patient not taking: Reported on 02/02/2024), Disp: 20 tablet, Rfl: 0   metoprolol  tartrate (LOPRESSOR ) 25 MG tablet, Take 2 hours before CT scan. (Patient not taking: Reported on 02/02/2024), Disp: 1 tablet, Rfl: 0   nystatin  (MYCOSTATIN ) 100000 UNIT/ML suspension, Swish and swallow 5 ml 4 x daily x 5 days (Patient not taking: Reported  on 02/02/2024), Disp: 100 mL, Rfl: 0   predniSONE  (DELTASONE ) 10 MG tablet, Take prednisone  40 mg daily x 2 days, then 20mg  daily x 2 days, then 10mg  daily x 2 days, then 5mg  daily x 2 days and stop (Patient not taking: Reported on 02/02/2024), Disp: 15 tablet, Rfl: 0      Objective:   Vitals:   02/02/24 0914  BP: 116/72  Pulse: 65  Temp: 98.3 F (36.8 C)  TempSrc: Oral  SpO2: 96%  Weight: 148 lb 12.8 oz (67.5 kg)  Height: 5' 2.5 (1.588 m)    Estimated body mass index is 26.78 kg/m as calculated from the following:   Height as of this encounter: 5' 2.5 (1.588 m).   Weight as of this encounter: 148 lb 12.8 oz (67.5 kg).  @WEIGHTCHANGE @  [employer]   02/02/24 0914  Weight: 148 lb 12.8 oz (67.5 kg)     Physical Exam   General: No distress. Looks well O2 at rest: no Cane present: no Sitting in wheel chair: no Frail: no Obese: no Neuro: Alert and Oriented x 3. GCS 15. Speech normal Psych: Pleasant Resp:  Barrel Chest - no.  Wheeze - no, Crackles -  no, No overt respiratory distress CVS: Normal heart sounds. Murmurs - no Ext: Stigmata of Connective Tissue Disease - nono HEENT: Normal upper airway. PEERL +. No post nasal drip        Assessment/     Assessment & Plan Eosinophilic asthma  Moderate persistent asthma without complication  Vaccine counseling  Cough, unspecified type    PLAN Patient Instructions     ICD-10-CM   1. Moderate persistent asthma without complication  J45.40     2. 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 Vaccine counseling  Z71.85     4. Cough, unspecified type  R05.9        - Well controlled since July 2025 visit except for random cough.   - -Off Dupixent  since April 2023 and since then 1 episode of flareup in August 2023 and then COVID in July 2024 amd Ju;ly 20205 after working in yard    Plan  -continue symbicort  as before -Use albuterol  as needed - ok to monitor without dupixent  at this moment -  Vaccine counseling  Plan  - hold off covid vaccine per shared decision making  - take astelin x 6 months to reduce covid risk = Avoid respiratory illness sick exposure and control your risk for respiratory infection  - be uptodate with all respiratory vaccines  - avoid sick contacts especially in areas of indoor clusters (churches, weddings, funerals, family gatherings, birthdays, planes, malls, indoor areas especially)   - mask in these areas   - discourage sick people from coming in close contact with you     Follow-up -return Everlene Cunning 15 min in 9 months     FOLLOWUP    Return for -return Amy Belloso 15 min in 9 months.    SIGNATURE    Dr. Dorethia Cave, M.D., F.C.C.P,  Pulmonary and Critical Care Medicine Staff Physician, [facility] Center Director - Interstitial Lung Disease  Program  Pulmonary Fibrosis [facility] at [facility] [place], [place], [address]  Pager: [phone], If no answer or between  15:00h - 7:00h: call [phone] Telephone: [phone]  9:35 AM 02/02/2024

## 2024-02-02 NOTE — Patient Instructions (Addendum)
 ICD-10-CM   1. Moderate persistent asthma without complication  J45.40     2. 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 Vaccine counseling  Z71.85     4. Cough, unspecified type  R05.9        - Well controlled since July 2025 visit except for random cough.   - -Off Dupixent  since April 2023 and since then 1 episode of flareup in August 2023 and then COVID in July 2024 amd Ju;ly 20205 after working in yard    Plan  -continue symbicort  as before -Use albuterol  as needed - ok to monitor without dupixent  at this moment -  Vaccine counseling  Plan  - hold off covid vaccine per shared decision making  - take astelin x 6 months to reduce covid risk = Avoid respiratory illness sick exposure and control your risk for respiratory infection  - be uptodate with all respiratory vaccines  - avoid sick contacts especially in areas of indoor clusters (churches, weddings, funerals, family gatherings, birthdays, planes, malls, indoor areas especially)   - mask in these areas   - discourage sick people from coming in close contact with you     Follow-up -return Verity Gilcrest 15 min in 9 months

## 2024-02-06 MED ORDER — AZELASTINE HCL 0.1 % NA SOLN: 2.0000 | Freq: Two times a day (BID) | NASAL | 12 refills | Status: AC

## 2024-02-06 NOTE — Addendum Note (Signed)
 Addended by: GERONIMO AMEL on: 02/06/2024 10:29 AM   Modules accepted: Orders

## 2024-02-08 DIAGNOSIS — H0102B Squamous blepharitis left eye, upper and lower eyelids: Secondary | ICD-10-CM | POA: Diagnosis not present

## 2024-02-08 DIAGNOSIS — H43391 Other vitreous opacities, right eye: Secondary | ICD-10-CM | POA: Diagnosis not present

## 2024-02-08 DIAGNOSIS — H0102A Squamous blepharitis right eye, upper and lower eyelids: Secondary | ICD-10-CM | POA: Diagnosis not present

## 2024-02-08 DIAGNOSIS — H0288A Meibomian gland dysfunction right eye, upper and lower eyelids: Secondary | ICD-10-CM | POA: Diagnosis not present

## 2024-02-08 DIAGNOSIS — H0288B Meibomian gland dysfunction left eye, upper and lower eyelids: Secondary | ICD-10-CM | POA: Diagnosis not present

## 2024-02-08 DIAGNOSIS — Z961 Presence of intraocular lens: Secondary | ICD-10-CM | POA: Diagnosis not present

## 2024-02-08 DIAGNOSIS — H00021 Hordeolum internum right upper eyelid: Secondary | ICD-10-CM | POA: Diagnosis not present

## 2024-02-09 ENCOUNTER — Ambulatory Visit (HOSPITAL_BASED_OUTPATIENT_CLINIC_OR_DEPARTMENT_OTHER): Admitting: Cardiology

## 2024-02-09 ENCOUNTER — Encounter (HOSPITAL_BASED_OUTPATIENT_CLINIC_OR_DEPARTMENT_OTHER): Payer: Self-pay | Admitting: Cardiology

## 2024-02-09 VITALS — BP 124/78 | HR 81 | Ht 62.5 in | Wt 149.7 lb

## 2024-02-09 DIAGNOSIS — R002 Palpitations: Secondary | ICD-10-CM | POA: Diagnosis not present

## 2024-02-09 DIAGNOSIS — I2510 Atherosclerotic heart disease of native coronary artery without angina pectoris: Secondary | ICD-10-CM

## 2024-02-09 DIAGNOSIS — I10 Essential (primary) hypertension: Secondary | ICD-10-CM

## 2024-02-09 DIAGNOSIS — E785 Hyperlipidemia, unspecified: Secondary | ICD-10-CM | POA: Diagnosis not present

## 2024-02-09 DIAGNOSIS — Z7189 Other specified counseling: Secondary | ICD-10-CM | POA: Diagnosis not present

## 2024-02-09 NOTE — Progress Notes (Signed)
 Cardiology Office Note:  .   Date:  02/09/2024  ID:  [name], DOB [birth year], MRN [id number] PCP: Clarice Nottingham, MD  [facility] Providers Cardiologist:  Shelda Bruckner, MD {  History of Present Illness: Robyn   [name] is a 78 y.o. female with PMH CAD, hypertension, hyperlipidemia, palpitations, multiple sclerosis, esophageal spasm. She was previously followed by Dr. Burnard and established care with me on 02/09/24.  Pertinent CV history:  Echo 06/13/23: EF 60-65%, indeterminate diastolic function. Normal RV. Mild-moderate AR, mild AS CT coronary 05/29/23: Ca score 72 (52nd %ile), mild nonobstructive CAD Carotids 03/31/20: bilateral 1-39% stenosis Monitor 09/02/17: occasional PACs and PVCs, longest SVT 8 beats  Today: Overall doing well. She sometimes get MS hugs that are squeezing/tightening sensations that can mimic a heart attack, but she feels like she can differentiate between the two. Has not had an episode in about 6 mos since starting new medication.   Has rare palpitations, no clear triggers.   Goes to the gym, has a psychologist, educational. Focuses on strength training. Walks with her dog. Overall eats fairly healthy, tries to minimize fast food, tries to eat omega 3 rich foods.  ROS: Denies shortness of breath at rest or with normal exertion. No PND, orthopnea, LE edema or unexpected weight gain. No syncope. ROS otherwise negative except as noted.   Studies Reviewed: Robyn    EKG:       Physical Exam:   VS:  BP 124/78   Pulse 81   Ht 5' 2.5 (1.588 m)   Wt 149 lb 11.2 oz (67.9 kg)   SpO2 97%   BMI 26.94 kg/m    Wt Readings from Last 3 Encounters:  02/09/24 149 lb 11.2 oz (67.9 kg)  02/02/24 148 lb 12.8 oz (67.5 kg)  12/14/23 147 lb 9.6 oz (67 kg)    GEN: Well nourished, well developed in no acute distress HEENT: Normal, moist mucous membranes NECK: No JVD CARDIAC: regular rhythm, normal S1 and S2, no rubs or gallops. 2/6 systolic ejection murmur, early  peaking. VASCULAR: Radial and DP pulses 2+ bilaterally. No carotid bruits RESPIRATORY:  Clear to auscultation without rales, wheezing or rhonchi  ABDOMEN: Soft, non-tender, non-distended MUSCULOSKELETAL:  Ambulates independently SKIN: Warm and dry, no edema NEUROLOGIC:  Alert and oriented x 3. No focal neuro deficits noted. PSYCHIATRIC:  Normal affect    ASSESSMENT AND PLAN: .    Nonobstructive CAD Hyperlipidemia -on repatha -labs followed by PCP. Per [facility] 09/2023, Tchol 143, HDL 58, LDL 61, TG 137 -not on aspirin , reasonable given minimal disease and age  Palpitations -reviewed prior monitor, occasional PACs/PVCs and brief SVT -continue diltiazem  240 mg daily  Hypertension -continue losartan  75 mg daily -on imdur , this has helped esophageal spasm  Murmur -soft, consistent with mild aortic stenosis. Also has known aortic regurgitation, mild-moderate -follow every ~5 years or sooner for change in symptoms or exam  CV risk counseling and prevention -recommend heart healthy/Mediterranean diet, with whole grains, fruits, vegetable, fish, lean meats, nuts, and olive oil. Limit salt. -recommend moderate walking, 3-5 times/week for 30-50 minutes each session. Aim for at least 150 minutes/week. Goal should be pace of 3 miles/hours, or walking 1.5 miles in 30 minutes -recommend avoidance of tobacco products. Avoid excess alcohol.  Dispo: 12 months or sooner as needed  Signed, Shelda Bruckner, MD   Shelda Bruckner, MD, PhD, [facility]  [facility]  [place]  [facility] at [facility] at [facility]  [address], [address] [place], [place] [address] [phone]

## 2024-02-09 NOTE — Patient Instructions (Signed)
 Medication Instructions:  No changes *If you need a refill on your cardiac medications before your next appointment, please call your pharmacy*  Lab Work: none If you have labs (blood work) drawn today and your tests are completely normal, you will receive your results only by: MyChart Message (if you have MyChart) OR A paper copy in the mail If you have any lab test that is abnormal or we need to change your treatment, we will call you to review the results.  Testing/Procedures: none  Follow-Up: At [facility], you and your health needs are our priority.  As part of our continuing mission to provide you with exceptional heart care, our providers are all part of one team.  This team includes your primary Cardiologist (physician) and Advanced Practice Providers or APPs (Physician Assistants and Nurse Practitioners) who all work together to provide you with the care you need, when you need it.  Your next appointment:   12 month(s)  Provider:   Shelda Bruckner, MD, Rosaline Bane, NP, or Reche Finder, NP

## 2024-02-26 ENCOUNTER — Other Ambulatory Visit: Payer: Self-pay | Admitting: Neurology

## 2024-02-26 MED ORDER — LAMOTRIGINE 25 MG PO TABS: ORAL_TABLET | ORAL | 3 refills | Status: AC

## 2024-03-25 ENCOUNTER — Other Ambulatory Visit: Payer: Self-pay | Admitting: Internal Medicine

## 2024-03-26 ENCOUNTER — Other Ambulatory Visit: Payer: Self-pay | Admitting: Internal Medicine

## 2024-06-13 ENCOUNTER — Ambulatory Visit

## 2024-08-08 ENCOUNTER — Ambulatory Visit: Admitting: Neurology
